# Patient Record
Sex: Male | Born: 1950
Health system: Southern US, Community
[De-identification: ages and names within clinical notes are randomized; demographics above are authoritative.]

## PROBLEM LIST (undated history)

## (undated) DIAGNOSIS — M545 Low back pain, unspecified: Secondary | ICD-10-CM

## (undated) DIAGNOSIS — I509 Heart failure, unspecified: Secondary | ICD-10-CM

## (undated) DIAGNOSIS — I10 Essential (primary) hypertension: Secondary | ICD-10-CM

## (undated) DIAGNOSIS — M5136 Other intervertebral disc degeneration, lumbar region: Secondary | ICD-10-CM

## (undated) DIAGNOSIS — K922 Gastrointestinal hemorrhage, unspecified: Secondary | ICD-10-CM

## (undated) DIAGNOSIS — I491 Atrial premature depolarization: Secondary | ICD-10-CM

## (undated) DIAGNOSIS — I2510 Atherosclerotic heart disease of native coronary artery without angina pectoris: Secondary | ICD-10-CM

## (undated) DIAGNOSIS — I480 Paroxysmal atrial fibrillation: Secondary | ICD-10-CM

## (undated) DIAGNOSIS — I259 Chronic ischemic heart disease, unspecified: Secondary | ICD-10-CM

## (undated) DIAGNOSIS — M51369 Other intervertebral disc degeneration, lumbar region without mention of lumbar back pain or lower extremity pain: Secondary | ICD-10-CM

## (undated) DIAGNOSIS — I4892 Unspecified atrial flutter: Secondary | ICD-10-CM

## (undated) DIAGNOSIS — N486 Induration penis plastica: Secondary | ICD-10-CM

## (undated) DIAGNOSIS — K219 Gastro-esophageal reflux disease without esophagitis: Secondary | ICD-10-CM

## (undated) HISTORY — DX: Gastrointestinal hemorrhage, unspecified: K92.2

## (undated) HISTORY — DX: Atherosclerotic heart disease of native coronary artery without angina pectoris: I25.10

## (undated) HISTORY — DX: Chronic ischemic heart disease, unspecified: I25.9

## (undated) HISTORY — DX: Essential (primary) hypertension: I10

## (undated) HISTORY — PX: HERNIA REPAIR: SHX51

## (undated) HISTORY — DX: Low back pain: M54.5

## (undated) HISTORY — DX: Unspecified atrial flutter: I48.92

## (undated) HISTORY — DX: Other intervertebral disc degeneration, lumbar region without mention of lumbar back pain or lower extremity pain: M51.369

## (undated) HISTORY — DX: Heart failure, unspecified: I50.9

## (undated) HISTORY — DX: Atrial premature depolarization: I49.1

## (undated) HISTORY — DX: Low back pain, unspecified: M54.50

## (undated) HISTORY — DX: Other intervertebral disc degeneration, lumbar region: M51.36

## (undated) HISTORY — DX: Paroxysmal atrial fibrillation: I48.0

## (undated) HISTORY — DX: Gastro-esophageal reflux disease without esophagitis: K21.9

## (undated) HISTORY — DX: Induration penis plastica: N48.6

---

## 1996-08-11 DIAGNOSIS — K260 Acute duodenal ulcer with hemorrhage: Secondary | ICD-10-CM | POA: Insufficient documentation

## 1998-04-06 ENCOUNTER — Ambulatory Visit: Admission: RE | Admit: 1998-04-06 | Discharge: 1998-04-06 | Payer: Self-pay | Admitting: Pulmonary Disease

## 1998-04-17 ENCOUNTER — Ambulatory Visit (HOSPITAL_COMMUNITY): Admission: RE | Admit: 1998-04-17 | Discharge: 1998-04-17 | Payer: Self-pay | Admitting: Sports Medicine

## 1998-10-31 HISTORY — PX: CORONARY ARTERY BYPASS GRAFT: SHX141

## 1999-03-11 ENCOUNTER — Encounter: Payer: Self-pay | Admitting: Emergency Medicine

## 1999-03-11 ENCOUNTER — Emergency Department (HOSPITAL_COMMUNITY): Admission: EM | Admit: 1999-03-11 | Discharge: 1999-03-11 | Payer: Self-pay | Admitting: Emergency Medicine

## 1999-03-20 ENCOUNTER — Inpatient Hospital Stay (HOSPITAL_COMMUNITY): Admission: AD | Admit: 1999-03-20 | Discharge: 1999-03-26 | Payer: Self-pay | Admitting: *Deleted

## 1999-03-21 ENCOUNTER — Encounter: Payer: Self-pay | Admitting: *Deleted

## 1999-03-22 ENCOUNTER — Encounter: Payer: Self-pay | Admitting: Cardiothoracic Surgery

## 1999-03-23 ENCOUNTER — Encounter: Payer: Self-pay | Admitting: Cardiothoracic Surgery

## 1999-03-24 ENCOUNTER — Encounter: Payer: Self-pay | Admitting: Cardiothoracic Surgery

## 1999-04-27 ENCOUNTER — Encounter (HOSPITAL_COMMUNITY): Admission: RE | Admit: 1999-04-27 | Discharge: 1999-07-26 | Payer: Self-pay | Admitting: *Deleted

## 1999-09-21 ENCOUNTER — Ambulatory Visit (HOSPITAL_COMMUNITY): Admission: RE | Admit: 1999-09-21 | Discharge: 1999-09-21 | Payer: Self-pay | Admitting: Cardiovascular Disease

## 2001-03-16 ENCOUNTER — Encounter: Payer: Self-pay | Admitting: Cardiothoracic Surgery

## 2001-03-16 ENCOUNTER — Encounter: Admission: RE | Admit: 2001-03-16 | Discharge: 2001-03-16 | Payer: Self-pay | Admitting: Cardiothoracic Surgery

## 2001-05-16 ENCOUNTER — Ambulatory Visit (HOSPITAL_COMMUNITY): Admission: RE | Admit: 2001-05-16 | Discharge: 2001-05-16 | Payer: Self-pay | Admitting: Surgery

## 2001-05-16 HISTORY — PX: HERNIA REPAIR: SHX51

## 2001-08-31 ENCOUNTER — Encounter: Payer: Self-pay | Admitting: Cardiology

## 2001-09-14 ENCOUNTER — Ambulatory Visit (HOSPITAL_COMMUNITY): Admission: RE | Admit: 2001-09-14 | Discharge: 2001-09-14 | Payer: Self-pay | Admitting: *Deleted

## 2001-09-14 ENCOUNTER — Encounter: Payer: Self-pay | Admitting: *Deleted

## 2001-09-14 HISTORY — PX: CARDIAC CATHETERIZATION: SHX172

## 2002-04-22 ENCOUNTER — Ambulatory Visit (HOSPITAL_COMMUNITY): Admission: RE | Admit: 2002-04-22 | Discharge: 2002-04-22 | Payer: Self-pay | Admitting: Surgery

## 2002-04-22 ENCOUNTER — Encounter (INDEPENDENT_AMBULATORY_CARE_PROVIDER_SITE_OTHER): Payer: Self-pay | Admitting: Specialist

## 2004-11-19 ENCOUNTER — Encounter: Admission: RE | Admit: 2004-11-19 | Discharge: 2004-11-19 | Payer: Self-pay | Admitting: Sports Medicine

## 2005-08-02 ENCOUNTER — Encounter: Payer: Self-pay | Admitting: Cardiology

## 2008-04-23 ENCOUNTER — Ambulatory Visit: Payer: Self-pay | Admitting: Internal Medicine

## 2008-04-23 DIAGNOSIS — K5900 Constipation, unspecified: Secondary | ICD-10-CM | POA: Insufficient documentation

## 2008-04-23 DIAGNOSIS — K219 Gastro-esophageal reflux disease without esophagitis: Secondary | ICD-10-CM

## 2008-04-24 ENCOUNTER — Telehealth: Payer: Self-pay | Admitting: Internal Medicine

## 2008-05-22 ENCOUNTER — Telehealth: Payer: Self-pay | Admitting: Internal Medicine

## 2009-06-05 ENCOUNTER — Encounter: Payer: Self-pay | Admitting: Cardiology

## 2009-06-08 ENCOUNTER — Encounter: Payer: Self-pay | Admitting: Cardiology

## 2009-06-19 ENCOUNTER — Encounter: Payer: Self-pay | Admitting: Cardiology

## 2009-09-11 ENCOUNTER — Encounter: Payer: Self-pay | Admitting: Cardiology

## 2009-09-17 ENCOUNTER — Encounter: Payer: Self-pay | Admitting: Cardiology

## 2009-09-29 ENCOUNTER — Encounter: Payer: Self-pay | Admitting: Sports Medicine

## 2010-01-20 ENCOUNTER — Ambulatory Visit: Payer: Self-pay | Admitting: Sports Medicine

## 2010-01-20 DIAGNOSIS — M1710 Unilateral primary osteoarthritis, unspecified knee: Secondary | ICD-10-CM

## 2010-01-20 DIAGNOSIS — M25569 Pain in unspecified knee: Secondary | ICD-10-CM

## 2010-08-10 DIAGNOSIS — I2510 Atherosclerotic heart disease of native coronary artery without angina pectoris: Secondary | ICD-10-CM | POA: Insufficient documentation

## 2010-08-10 DIAGNOSIS — I10 Essential (primary) hypertension: Secondary | ICD-10-CM | POA: Insufficient documentation

## 2010-08-10 DIAGNOSIS — K922 Gastrointestinal hemorrhage, unspecified: Secondary | ICD-10-CM | POA: Insufficient documentation

## 2010-08-10 DIAGNOSIS — M549 Dorsalgia, unspecified: Secondary | ICD-10-CM | POA: Insufficient documentation

## 2010-08-11 ENCOUNTER — Ambulatory Visit: Payer: Self-pay | Admitting: Cardiology

## 2010-08-11 DIAGNOSIS — E785 Hyperlipidemia, unspecified: Secondary | ICD-10-CM

## 2010-08-11 DIAGNOSIS — I2589 Other forms of chronic ischemic heart disease: Secondary | ICD-10-CM

## 2010-09-20 ENCOUNTER — Encounter: Payer: Self-pay | Admitting: Internal Medicine

## 2010-09-21 ENCOUNTER — Encounter: Payer: Self-pay | Admitting: Internal Medicine

## 2010-11-21 ENCOUNTER — Encounter: Payer: Self-pay | Admitting: Sports Medicine

## 2010-12-02 NOTE — Letter (Signed)
Summary: Keith Vazquez's Office Visit Note   Keith Vazquez's Office Visit Note   Imported By: Roderic Ovens 09/07/2010 13:38:58  _____________________________________________________________________  External Attachment:    Type:   Image     Comment:   External Document

## 2010-12-02 NOTE — Letter (Signed)
Summary: [facility]   Imported By: Roderic Ovens 09/07/2010 13:39:34  _____________________________________________________________________  External Attachment:    Type:   Image     Comment:   External Document

## 2010-12-02 NOTE — Consult Note (Signed)
Summary: [facility]   Imported By: Marily Memos 12/29/2009 15:16:40  _____________________________________________________________________  External Attachment:    Type:   Image     Comment:   External Document

## 2010-12-02 NOTE — Letter (Signed)
Summary: [facility]  [facility]   Imported By: Marylou Mccoy 09/02/2010 14:50:41  _____________________________________________________________________  External Attachment:    Type:   Image     Comment:   External Document

## 2010-12-02 NOTE — Letter (Signed)
Summary: [facility]  [facility] Myocardial Perfusion 2004 - 2008   Imported By: Roderic Ovens 09/03/2010 12:57:56  _____________________________________________________________________  External Attachment:    Type:   Image     Comment:   External Document

## 2010-12-02 NOTE — Letter (Signed)
Summary: [facility] Office Visit Note   [facility] Office Visit Note   Imported By: Roderic Ovens 09/03/2010 12:58:40  _____________________________________________________________________  External Attachment:    Type:   Image     Comment:   External Document

## 2010-12-02 NOTE — Assessment & Plan Note (Signed)
Summary: KNEE PAIN,MC   Vital Signs:  Patient profile:   60 year old male Height:      74 inches Weight:      200 pounds BMI:     25.77 BP sitting:   143 / 83  Vitals Entered By: Enid Baas MD (January 20, 2010 3:53 PM)  Primary Provider:  company physician, Bradd Canary   History of Present Illness: 25 yrs ago R MCL tear repaired by Dr. Dian Queen  lumbar facet joint Dr. 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 Darvocet 2)  ! Percocet  Physical Exam  General:  pleasant w male in nad  R knee lacks 10 degrees to full flexion lacks 4 degrees full extension R knee positive McMurry's there is also mild pain on thessaly test  no effusion now prob some med joint line hypertrophy   Impression & Recommendations:  Problem # 1:  KNEE PAIN, RIGHT (ICD-719.46)  His updated medication list for this problem includes:    Celebrex 200 Mg Caps (Celecoxib) .Marland Kitchen... As needed    Vicodin 5-500 Mg Tabs (Hydrocodone-acetaminophen) .Marland Kitchen... As needed  this is improving and he can use as needed meds  wedge added to RT heel on medial aspect to unload and this lessens his walking pain no pain jogging with this in place gait is normal  Problem # 2:  OSTEOARTHRITIS, KNEE, RIGHT (ICD-715.96)  His updated medication list for this problem includes:    Celebrex 200 Mg Caps (Celecoxib) .Marland Kitchen... As needed    Vicodin 5-500 Mg Tabs (Hydrocodone-acetaminophen) .Marland Kitchen... As needed  I suspect most of his change is chronic and represents mild DJD may well have degenerative meniscal  tearing with findings on exam  rec biking for primary sport activity to protect knee some walking and running OK avoid stair master or steps  reck prn  Complete Medication List: 1)  Limbrel 500 Mg Caps (Flavocoxid) .... Two times a day 2)  Celebrex 200 Mg Caps (Celecoxib) .... As needed 3)  Prilosec 20 Mg Cpdr (Omeprazole) .... As needed 4)  Vicodin 5-500 Mg Tabs (Hydrocodone-acetaminophen) .... As needed 5)  Protonix 40 Mg Tbec (Pantoprazole sodium) .Marland Kitchen.. 1tablet by mouth once daily  Patient Instructions: 1)  both Bike and eliptical ok 2)  Stay away from stairmaster 3)  no squats

## 2010-12-02 NOTE — Assessment & Plan Note (Signed)
Summary: np6/eval for stress test/hx of cabg   Primary Provider:  company physician, Bradd Canary  CC:  referal from Dr. Lorenz Coaster...for the [employer]  pt states a routine .  History of Present Illness: 60 year old male for evaluation of coronary artery disease. Patient is status post coronary artery bypass graft in 2000. Last cardiac catheterization was performed in 2003 and revealed mildly reduced LV function with an ejection fraction of 51%, native three-vessel coronary artery disease, the LIMA to LAD was occluded, saphenous vein graft to the second diagonal was patent, saphenous vein graft to the PDA was patent and the sequential saphenous vein graft to the ramus intermedius and marginal was patent. Myoview in November of 2010 showed an ejection fraction of 32%. There was a prior inferior lateral infarct but no ischemia. Patient denies dyspnea on exertion, orthopnea, PND, pedal edema, palpitations, syncope or chest pain. He exercises routinely with no symptoms. 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  Current Medications (verified): 1)  Limbrel 500 Mg  Caps (Flavocoxid) .... Two Times A Day 2)  Vitamin B .... 1 Tab By Mouth Once Daily 3)  Vitamin D 1000 Unit Tabs (Cholecalciferol) .Marland Kitchen.. 1 Tab By Mouth Once Daily 4)  Fish Oil   Oil (Fish Oil) .Marland Kitchen.. 1 Tab By Mouth Once Daily 5)  Niacin Cr 250 Mg Cr-Caps (Niacin) .Marland Kitchen.. 1 Tab By Mouth Once Daily 6)  Metamucil .Marland Kitchen.. 1 Tab By Mouth Once Daily 7)  Ramipril 10 Mg Caps (Ramipril) .... Take One Capsule By Mouth Daily 8)  Crestor 10 Mg Tabs (Rosuvastatin Calcium) .... 1/4 of A Pill Every A Pilll Every Other Day 9)  Limbrel 500 Mg Caps (Flavocoxid) .Marland Kitchen.. 1 Tab By Mouth Once Daily  Allergies: 1)  ! Darvocet 2)  ! Percocet  Past History:  Past Medical History: Coronary Artery Disease-status post coronary artery bypass grafting in 2000 HYPERTENSION  Hyperlipidemia GI Bleed-1997 secondary to duodenal ulcer and requiring endoscopic hemostatic  therapy OSTEOARTHRITIS, KNEE, RIGHT  GERD   Past Surgical History: CABG w LIMA to LAD, SVG to diagonal, SVG to Ramus-OM and SVG to PDA 2000 Hernia Surgery -  bilateral inguinal with unilateral read Ulcer surgery - endoscopic. Right knee surgery  Family History: Reviewed history from 04/23/2008 and no changes required. Patient adopted   Social History: Reviewed history from 04/23/2008 and no changes required. 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 single without children  Review of Systems       Arthralgias but no fevers or chills, productive cough, hemoptysis, dysphasia, odynophagia, melena, hematochezia, dysuria, hematuria, rash, seizure activity, orthopnea, PND, pedal edema, claudication. Remaining systems are negative.   Vital Signs:  Patient profile:   60 year old male Height:      74 inches Weight:      210 pounds BMI:     27.06 Resp:     12 per minute BP sitting:   138 / 84  (left arm)  Vitals Entered By: Kem Parkinson (August 11, 2010 10:27 AM)  Physical Exam  General:  Well developed/well nourished in NAD Skin warm/dry Patient not depressed No peripheral clubbing Back-normal HEENT-normal/normal eyelids Neck supple/normal carotid upstroke bilaterally; no bruits; no JVD; no thyromegaly chest - CTA/ normal expansion CV - RRR/normal S1 and S2; no murmurs, rubs or gallops;  PMI nondisplaced Abdomen -NT/ND, no HSM, no mass, + bowel sounds, no bruit 2+ femoral pulses, no bruits Ext-no edema,  chords, 2+ DP Neuro-grossly nonfocal     EKG  Procedure date:  08/11/2010  Findings:      Normal sinus rhythm at a rate of 91. Prior inferior infarct.  Impression & Recommendations:  Problem # 1:  CAD (ICD-414.00) Patient presents with history of coronary artery disease, ischemic cardiomyopathy and for treadmill prior to [facility] evaluation. I reviewed his outside records and  his previous Myoview suggested an ejection fraction of 32%. I had lengthy discussions with him today concerning the implications of that. If indeed his ejection fraction is less than 35% then he would be at risk for sudden death and an ICD would be indicated. I suggested that we repeat his stress Myoview both to exclude ischemia and to reassess LV function. I also recommended an echocardiogram to assess LV function. The patient became very upset with the above. He states that the ejection fraction of 32% was felt to be a mistake previously. He also states that he has no symptoms and therefore his ejection fraction could not be at this level. I explained that this was the reason we should repeat the studies to reassess his LV function. However he would not agree to this and walked out of the office. He stated he would not want further care from me. I also recommended an aspirin and he stated he would consider this. I also felt he should be on a beta blocker but he states he felt fatigued on Toprol previously. He would not wait for further instructions concerning other potential beta blockers. I spent a significant amount of time with the patient trying to explain all of the above. He indicated he would not return. His updated medication list for this problem includes:    Ramipril 10 Mg Caps (Ramipril) .Marland Kitchen... Take one capsule by mouth daily  Problem # 2:  ISCHEMIC CARDIOMYOPATHY (ICD-414.8) See discussion above. Continue ACE inhibitor. Patient would not agree to further testing. His updated medication list for this problem includes:    Ramipril 10 Mg Caps (Ramipril) .Marland Kitchen... Take one capsule by mouth daily  Problem # 3:  HYPERLIPIDEMIA (ICD-272.4) Continue present medications. Lipids and liver monitored by primary care. His updated medication list for this problem includes:    Niacin Cr 250 Mg Cr-caps (Niacin) .Marland Kitchen... 1 tab by mouth once daily    Crestor 10 Mg Tabs (Rosuvastatin calcium) .Marland Kitchen... 1/4 of a pill  every a pilll every other day  Problem # 4:  HYPERTENSION (ICD-401.9) Blood pressure controlled on present medications. Potassium and renal function monitored by primary care. His updated medication list for this problem includes:    Ramipril 10 Mg Caps (Ramipril) .Marland Kitchen... Take one capsule by mouth daily  Orders: EKG w/ Interpretation (93000)  Patient Instructions: 1)  Your physician recommends that you schedule a follow-up appointment in: AS NEEDED 2)  Your physician recommends that you continue on your current medications as directed. Please refer to the Current Medication list given to you today.

## 2011-03-18 NOTE — Op Note (Signed)
[facility]  Patient:    LERAY, GARVERICK                     MRN: [id number] Proc. Date: 05/16/01 Adm. Date:  [id number] Attending:  Katha Cabal CC:         Cecil Cranker, M.D. [facility]  Barron Alvine, M.D.   Operative Report  CCS#:  [id number]  PREOPERATIVE DIAGNOSIS:  Bilateral inguinal hernia, left larger than right.  POSTOPERATIVE DIAGNOSIS:  Large left indirect inguinal hernia with right direct hernia.  SURGEON:  Thornton Park. Daphine Deutscher, M.D.  ANESTHESIA:  General endotracheal.  PROCEDURE:  Laparoscopic bilateral inguinal hernia repairs.  DESCRIPTION OF PROCEDURE:  Mr. Freimark was taken to room #1 and given general anesthesia. The abdomen was shaved and prepped with Betadine and draped sterilely. A longitudinal incision was made beneath the umbilicus and I went off to the left of the midline and in the rectus space created a blunt track down to the pubis. The dissection balloon was inserted and with the zero degree scope in the balloon, this was insufflated and preperitoneal dissection was performed which really dissected more to the right side than to the left. However, I was able to then establish pneumo in the preperitoneum and put in the two 5 mm trocars sliding off the midline under direct vision with the scope. Using these, I then dissected first the right inguinal region and found there to be a direct defect. The cord structures were skeletonized and no indirect hernia was seen. I did get around the cord structures on that side. On the left side, however, there was a massive more of a scrotal hernia. I had to dissect around the cord to then appreciate the size of this defect. I then began teasing the sac out and find just a massive preperitoneal fatty hernia which I completely reduced and skeletonized the cord. A piece of 3 x 6 inch Mesh was dissected laterally and was inserted into the preperitoneal space and was placed around the  cord structures intact laterally where I could palpate the tacker and along the pubis medially and then anteriorly in the midline. On top of that a piece of Davol precut mesh was brought intact medially and then it laid out laterally over this piece. This was a large piece of the Davol mesh preshaped. It was tacked along the pubic bone and then anteriorly.  Next, I went to the patients right side and took the piece of mesh that was for the right side and again marking it where it would go medially and then tacked it medially and then let it splay out laterally. It seemed to cover the defect nicely. The preperitoneal space was then inspected both with my finger inside after I removed the trocar and then put it back in and looked and again I felt like everything was nicely secured. Because of the extent of dissection on the left, the patient did have a pneumo in the scrotum which was anticipated. I did get well around the cord structures with the first piece of mesh and the second piece was a symmetrical piece of Davol mesh both for the left side and the right side. Thus, the patient had two pieces of mesh on the left and one on the right. The patient seemed to tolerate the procedure well. After deflation, the pursestring suture that I placed in the anterior rectus fascia was tied down and the skin was closed with 4-0 Vicryl  with Benzoin and Steri-Strips. The patient seemed to tolerate the procedure well and was taken to the recovery room in satisfactory condition. He will be given Mepergan Fortis to take for pain and will be followed up in the office in two weeks. DD:  05/16/01 TD:  05/16/01 Job: [id number]

## 2011-03-18 NOTE — Cardiovascular Report (Signed)
. [facility]  Patient:    Keith Vazquez, Keith Vazquez Visit Number: [id number] MRN: [id number]          Service Type: CAT Location: [facility] [id number] Attending Physician:  Daisey Must Proc. Date: 09/14/01 Admit Date:  09/14/2001   CC:         Keith Vazquez, M.D. [facility]  Bea Laura Graceann Congress, M.D. [facility]   Cardiac Catheterization  PROCEDURE:  Left heart catheterization with coronary angiography, bypass graft angiography, and left ventriculography.  INDICATIONS:  Keith Vazquez is a 60 year old male with a history of previous coronary artery bypass surgery.  He recently had a stress Cardiolite scan which showed prior inferolateral infarct with mild periinfarct ischemia in the high lateral wall.  He was thus referred for cardiac catheterization.  DESCRIPTION OF PROCEDURE:  A 6 French sheath was placed in the right femoral artery.  Left coronary angiography was performed with a 6 [place] JL4.  We used a 6 [place] JR4 to perform right coronary angiography as well as angiography of the saphenous vein bypass grafts and the left internal mammary artery graft. Left ventriculography was performed with an angled pigtail.  Contrast was Omnipaque.  At the conclusion of the case, Perclose vascular closure device was placed in the right femoral artery with good hemostasis.  There were no complications.  RESULTS:  HEMODYNAMICS:  Left ventricular pressure 104/16, aortic pressure 104/62.  There was no aortic valve gradient.  LEFT VENTRICULOGRAM:  There is moderate hypokinesis of the inferior wall. Ejection fraction is calculated at 51%.  No mitral regurgitation.  CORONARY ARTERIOGRAPHY:  (Right dominant)  Left main has a distal 50% stenosis.  Left anterior descending artery has a 70% stenosis in the proximal vessel and a tubular 90% stenosis in the midvessel between the first and second diagonal branches.  There is a small first diagonal and large second diagonal.  The left  circumflex has a 50% stenosis at its origin and 100% occlusion in the midvessel.  Proximal to the occlusion there is a large ramus intermediate which has a 30% stenosis proximally.  There is also a normal size obtuse marginal arising distal to the occlusion which fills via saphenuos vein graft.  Right coronary artery is 100% occluded at its origin.  Left internal mammary artery to the distal LAD is very atretic proximally and 100% occluded in the midvessel.  Saphenuos vein graft to the second diagonal branch is patent.  This fills a normal size second diagonal with retrograde filling to the mid and distal LAD. The distal LAD appears to get adequate blood flow via this vein graft.  Sequential saphenuos vein graft to the ramus intermediate and the obtuse marginal is patent.  There is a 20% stenosis in the proximal portion of this vein and some mild ectasia in the distal portion of this vein graft at what appears to be a venous valve.  This fills a large size ramus intermediate and a normal size obtuse marginal.  Saphenuos vein graft to the posterior descending artery is patent throughout its course with mild ectasia in the proximal graft.  This fills a large posterior descending artery.  This also fills retrograde to the mid right coronary artery as well as into the distal AV groove portion of the right coronary artery which supplies two small posterolateral branches.  There is a tubular 80% stenosis in the proximal portion of the posterior descending artery which does jeopardize a small posterolateral branches.  IMPRESSION: 1. Mildly decreased left ventricular  systolic function. 2. Native three vessel coronary artery disease as described. 3. Status post coronary artery bypass grafting with all three saphenuos vein    grafts patent.   The left internal mammary artery is atretic and occluded    in the midvessel.  This is likely secondary to competitive flow into the    distal LAD via  the vein graft to the diagonal branch.  The distal LAD    does appear to be adequately perfused by this vein graft to the diagonal    There is a stenosis in the proximal posterior descending artery which is    proximal to the vein graft insertion site.  This is likely responsible for    the mild high lateral wall ischemia seen on Cardiolite.  This is a very    small territory of myocardium being perfused by a very small posterolateral    branches and does not appear to be clinically significant.  PLAN:  The patient will be continued on medical therapy. Attending Physician:  Daisey Must DD:  09/14/01 TD:  09/14/01 Job: [id number]

## 2011-03-18 NOTE — Op Note (Signed)
[facility]  Patient:    Keith Vazquez, Keith Vazquez Visit Number: [id number] MRN: [id number]          Service Type: DSU Location: DAY Attending Physician:  Katha Cabal Dictated by:   Thornton Park Daphine Deutscher, M.D. Proc. Date: 04/22/02 Admit Date:  04/22/2002   CC:         Cecil Cranker, M.D. [facility]   Operative Report  PREOPERATIVE DIAGNOSIS:  Recurrent left inguinal hernia.  POSTOPERATIVE DIAGNOSIS:  Left indirect recurrent inguinal hernia.  SURGEON:  Thornton Park. Daphine Deutscher, M.D.  ASSISTANT:  Sandria Bales. Ezzard Standing, M.D.  ANESTHESIA:  General endotracheal.  DESCRIPTION OF PROCEDURE:  The patient underwent a laparoscopic bilateral inguinal herniorrhaphy on May 16, 2001. At that time, he was found to have a massive scrotal hernia on the left side which with the hernia and a large lipoma were reduced in mass. The left side was repaired with a piece of mesh that was cut to go around the cord structures and also a second piece of Davol mesh that was placed on top of that. The patient describes some callisthenics and lifting and developed a recurrent bulge in that area.  Informed consent was obtained in the office when I saw him about having to do this open with many of the same complications as described before including numbness, nerve pain, cord ischemia.  The patient was taken to room six at [facility] in general anesthesia. The abdomen was prepped with Betadine, draped sterilely. I first injected the area with a mixture of Marcaine and lidocaine and made a small oblique incision. The incision was made and fatty tissue was divided with the electrocautery. I had to expose the external oblique, which I incised, and then opened with the scissors. I took care to avoid any nerve injuries of the cord structures. A larger nerve branch was noted underneath the superior flap which I avoided when I subsequently repaired with mesh. First, I got around the cord with  a Penrose drain and mobilized the cord completely. The floor actually looked good. There was a large indirect hernia present. I opened the cord structures proximally and dissected out a massive lipoma. I ended up skeletonizing that and removing a lot of the peritoneum that went along around it. I found a tiny indirect hernia and I excised the excess of that and I closed the sac. I was then able to tuck all of this down inside this defect. I could feel mesh laterally and this hernia had come through a fairly small little ring within the mesh. I grasped the edges of the mesh and put a single 0 Prolene to approximate this and this closed down the neoring from laterally to allow just a Kelly clamp to make plenty of room for the vas deferens and the spermatic vessels, which were intact.  Next, I cut a piece of Marlex mesh to fit, sutured it to the inguinal ligament with a running 2-0 Prolene medially. I sutured it with a running 2-0 Prolene and cut it and tucked it beneath the external oblique and sutured it with a horizontal mattress of 2-0 Prolene.  The inguinal nerve, as mentioned before, was avoided in this. The fascia was closed with a running 2-0 Vicryl. Subcutaneous tissue closed with 4-0 Vicryl and along with 4-0 and 5-0 Vicryl to close the skin. Benzoin and Steri-Strips were used on the skin.  In summary, this appeared to be a recurrent indirect inguinal hernia with a neosac that had accompanied  a large properitoneal fat mass that occurred through a fairly small defect in the mesh ring.  The patient tolerated the procedure well and was taken to the recovery room in satisfactory condition. The patient will be given Mepergan Fortis to take for pain and discharged to be followed up in the office in two weeks. Dictated by:   Thornton Park Daphine Deutscher, M.D. Attending Physician:  Katha Cabal DD:  04/22/02 TD:  04/23/02 Job: [id number]

## 2011-04-12 ENCOUNTER — Encounter: Payer: Self-pay | Admitting: Cardiology

## 2011-09-05 ENCOUNTER — Encounter: Payer: Self-pay | Admitting: Internal Medicine

## 2011-09-21 ENCOUNTER — Encounter: Payer: Self-pay | Admitting: Internal Medicine

## 2011-10-21 ENCOUNTER — Encounter: Payer: Self-pay | Admitting: *Deleted

## 2011-10-21 ENCOUNTER — Encounter: Payer: Self-pay | Admitting: Internal Medicine

## 2011-10-21 ENCOUNTER — Ambulatory Visit (INDEPENDENT_AMBULATORY_CARE_PROVIDER_SITE_OTHER): Payer: BC Managed Care – PPO | Admitting: Internal Medicine

## 2011-10-21 DIAGNOSIS — I2589 Other forms of chronic ischemic heart disease: Secondary | ICD-10-CM

## 2011-10-21 DIAGNOSIS — I4891 Unspecified atrial fibrillation: Secondary | ICD-10-CM | POA: Insufficient documentation

## 2011-10-21 NOTE — Assessment & Plan Note (Signed)
Today we've spent a considerable amount of time discussing his treatment options. 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 The patient thinks a stress test but free of arrhythmia would be sufficient. I will need to confirm this with his regular cardiologist, Dr. Majel Homer. If maintaining sinus rhythm is ultimately required, then there are 4 drugs which would be considered. Amiodarone, sotalol, dofetilide, and Dronenderone would all be options. The patient is not particularly interested in hospitalization. Of course sotalol and dofetilide would require 2 or 3 days in the hospital for monitoring should he choose these drugs. 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 I plan to discuss these issues with his primary cardiologist Dr. Majel Homer. At this point, catheter ablation would not be recommended as he has not yet failed a primary antiarrhythmic drug.

## 2011-10-21 NOTE — Patient Instructions (Signed)
Your physician wants you to follow-up in: 6 MONTHS WITH DR. TAYLOR. You will receive a reminder letter in the mail two months in advance. If you don't receive a letter, please call our office to schedule the follow-up appointment.  Your physician recommends that you continue on your current medications as directed. Please refer to the Current Medication list given to you today.  

## 2011-10-21 NOTE — Assessment & Plan Note (Signed)
The patient is asymptomatic. He has class I symptoms. We'll continue his current medical therapy

## 2011-10-21 NOTE — Progress Notes (Signed)
HPI Keith Vazquez is referred today for evaluation of atrial fibrillation. The patient works as a Control and instrumentation engineer. He has a history of coronary disease and is status post bypass surgery. Most recent stress test demonstrated left ventricular dysfunction with an ejection fraction of 36%. He had no ischemia. During his stress test, he experienced atrial fibrillation in recovery which was asymptomatic. The patient does not feel palpitations. He has never had syncope, chest pain, and denies shortness of breath. He remains active without limitation. The patient has worn a cardiac monitor which is described as having atrial fibrillation approximately 15% of the time. The patient is here today inquiring as to whether he would be a candidate for catheter ablation in hopes of maintaining his Education officer, museum. He has been unwilling to take an antiarrhythmic drug at this point. Allergies  Allergen Reactions  . Oxycodone-Acetaminophen   . Propoxyphene N-Acetaminophen      Current Outpatient Prescriptions  Medication Sig Dispense Refill  . Ascorbic Acid (VITAMIN C) 100 MG tablet Take 100 mg by mouth daily.        Marland Kitchen aspirin 81 MG tablet Take 81 mg by mouth daily.        Marland Kitchen b complex vitamins capsule Take 1 capsule by mouth daily.        . celecoxib (CELEBREX) 200 MG capsule Take 200 mg by mouth as needed.       . Cholecalciferol (VITAMIN D) 1000 UNITS capsule Take 1,000 Units by mouth daily.        . Flavocoxid (LIMBREL) 500 MG CAPS Take 500 mg by mouth daily.        . hydrocodone-acetaminophen (LORCET-HD) 5-500 MG per capsule Take 1 capsule by mouth every 6 (six) hours as needed.        . niacin 250 MG CR capsule Take 250 mg by mouth daily.        . Omega-3 Fatty Acids (FISH OIL PO) Take 1 tablet by mouth daily.        . pantoprazole (PROTONIX) 40 MG tablet Take 40 mg by mouth daily.        . psyllium (METAMUCIL) 58.6 % powder Take 1 packet by mouth as needed.        . ramipril (ALTACE) 10 MG tablet  Take 10 mg by mouth daily.           Past Medical History  Diagnosis Date  . Coronary artery disease     CABG x5 in 2002 -- per stress test in 09/05/11 Est EF of 36%  . PAC (premature atrial contraction)     OCCASIONAL   . Hypertension     Borderline  . Degeneration of lumbar intervertebral disc   . GERD (gastroesophageal reflux disease)   . Ischemic heart disease   . Low back pain   . Peyronie's disease     ROS:   All systems reviewed and negative except as noted in the HPI.   Past Surgical History  Procedure Date  . Coronary artery bypass graft 2002    x5 --   . Cardiac catheterization 09/14/2001     Mildly decreased left ventricular systolic function --  Native three vessel coronary artery disease as described. -- Status post coronary artery bypass grafting  . Hernia repair 05/16/2001    Large left indirect inguinal hernia with right direct hernia.  Marland Kitchen Hernia repair     Recurrent left inguinal hernia -- Large left indirect inguinal hernia with right direct hernia.  No family history on file.   History   Social History  . Marital Status: Single    Spouse Name: N/A    Number of Children: N/A  . Years of Education: N/A   Occupational History  . Not on file.   Social History Main Topics  . Smoking status: Unknown If Ever Smoked  . Smokeless tobacco: Not on file  . Alcohol Use: Not on file  . Drug Use: Not on file  . Sexually Active: Not on file   Other Topics Concern  . Not on file   Social History Narrative  . No narrative on file     BP 155/84  Pulse 65  Wt 96.616 kg (213 lb)  Physical Exam:  Well appearing middle-age man, NAD HEENT: Unremarkable, normocephalic and atraumatic. Neck:  No JVD, no thyromegally Lymphatics:  No adenopathy Back:  No CVA tenderness Lungs:  Clear with no wheezes, rales, or rhonchi. HEART:  Regular rate rhythm, no murmurs, no rubs, no clicks Abd:  soft, positive bowel sounds, no organomegally, no rebound, no  guarding Ext:  2 plus pulses, no edema, no cyanosis, no clubbing Skin:  No rashes no nodules Neuro:  CN II through XII intact, motor grossly intact  EKG Normal sinus rhythm with PVCs and PACs  Assess/Plan:

## 2011-11-09 ENCOUNTER — Institutional Professional Consult (permissible substitution): Payer: Self-pay | Admitting: Internal Medicine

## 2012-04-17 LAB — PULMONARY FUNCTION TEST

## 2012-04-19 ENCOUNTER — Ambulatory Visit
Admission: RE | Admit: 2012-04-19 | Discharge: 2012-04-19 | Disposition: A | Discharge status: Home / Self Care | Payer: BC Managed Care – PPO | Source: Ambulatory Visit | Attending: Family Medicine | Admitting: Family Medicine

## 2012-04-19 ENCOUNTER — Other Ambulatory Visit: Payer: Self-pay | Admitting: Family Medicine

## 2012-04-19 ENCOUNTER — Telehealth: Payer: Self-pay | Admitting: Internal Medicine

## 2012-04-19 DIAGNOSIS — R0600 Dyspnea, unspecified: Secondary | ICD-10-CM

## 2012-04-19 NOTE — Telephone Encounter (Signed)
New msg Dr Polly Cobia office wants to see if can get pt in sooner Please call back

## 2012-04-19 NOTE — Telephone Encounter (Signed)
05/10/12 Apt moved up office aware and Leotis Shames will let pt know

## 2012-04-20 ENCOUNTER — Telehealth: Payer: Self-pay | Admitting: Internal Medicine

## 2012-04-20 NOTE — Telephone Encounter (Signed)
I spoke with Dr. Geoffery Lyons office. They wanted to verify the time for his appointment on 7/11. I explained he had a cancellation for Tuesday 04/24/12 at 8:45 am. They are going to call the patient and make him aware.

## 2012-04-20 NOTE — Telephone Encounter (Signed)
New Problem:    Called in wanting to patient to be seen by Dr. Ladona Ridgel ASAP.  Believes that the patient's Afib has gotten a lot worse, is at high risk for a stroke and needs to be on anticoagulants.  Please call back.

## 2012-04-24 ENCOUNTER — Ambulatory Visit (INDEPENDENT_AMBULATORY_CARE_PROVIDER_SITE_OTHER): Payer: BC Managed Care – PPO | Admitting: Internal Medicine

## 2012-04-24 ENCOUNTER — Encounter: Payer: Self-pay | Admitting: Internal Medicine

## 2012-04-24 ENCOUNTER — Telehealth: Payer: Self-pay | Admitting: Internal Medicine

## 2012-04-24 VITALS — BP 132/72 | HR 88 | Resp 18 | Ht 74.0 in | Wt 210.8 lb

## 2012-04-24 DIAGNOSIS — I2589 Other forms of chronic ischemic heart disease: Secondary | ICD-10-CM

## 2012-04-24 DIAGNOSIS — I2510 Atherosclerotic heart disease of native coronary artery without angina pectoris: Secondary | ICD-10-CM

## 2012-04-24 DIAGNOSIS — I10 Essential (primary) hypertension: Secondary | ICD-10-CM

## 2012-04-24 DIAGNOSIS — I4891 Unspecified atrial fibrillation: Secondary | ICD-10-CM

## 2012-04-24 NOTE — Patient Instructions (Signed)
Your physician wants you to follow-up in: November 2013 after the Stress Myoveiw You will receive a reminder letter in the mail two months in advance. If you don't receive a letter, please call our office to schedule the follow-up appointment.  Your physician has requested that you have en exercise stress myoview. For further information please visit [email or web address] Please follow instruction sheet, as given.--NEEDS TO BE DONE AFTER 09/05/12  Your physician has requested that you have an echocardiogram. Echocardiography is a painless test that uses sound waves to create images of your heart. It provides your doctor with information about the size and shape of your heart and how well your heart's chambers and valves are working. This procedure takes approximately one hour. There are no restrictions for this procedure.  NEED TO START METOPROLOL 25MG  TWICE DAILY AFTER 08/14/12 AND START MULTAQ AS PREVIOUSLY PRESCRIBED   Amiodarone is the name of the othe medication

## 2012-04-24 NOTE — Telephone Encounter (Signed)
New Problem:    Patient called in to report that amioderone is apporved by the [employer] up to 200Mg  a day.  Patient would like to know if you will prescribe that medication to him under certain circumstances.  Please call back.

## 2012-04-24 NOTE — Assessment & Plan Note (Signed)
I discussed the treatment options in detail with the patient. He has failed antiarrhythmic drug therapy and would normally be a candidate for catheter ablation. Because the patient is asymptomatic, I am reluctant to recommend this procedure for the patient. I've explained to him that there is no cure for atrial fibrillation but that the catheter ablation procedure offers a treatment. The expectation would be that his amount of atrial fibrillation would be reduced. It would not be likely to eliminate it altogether. I also discussed antiarrhythmic drug therapy with the patient. It is hard to justify the risk of antiarrhythmic drug therapy in someone who is truly asymptomatic. I do think he should be under continuous anticoagulation for thromboembolic prevention. After an extensive discussion, I recommended that the patient undergo repeat stress testing in November. Hopefully this time he will pass a stress test and be able to reobtain his Education officer, [employer].

## 2012-04-24 NOTE — Assessment & Plan Note (Signed)
His ejection fraction is approximately 40%. Continue current medical therapy. I've recommended that he restart his beta blocker. He is prescribed metoprol 25 mg twice daily.

## 2012-04-24 NOTE — Progress Notes (Signed)
HPI Keith Vazquez returns today for followup. He is a 61 year old man with a history of coronary disease status post MI, paroxysmal atrial fibrillation and flutter, and dyslipidemia. 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 Unfortunately stress testing and Holter monitoring have demonstrated paroxysmal atrial fibrillation and flutter at rapid ventricular rates. When I saw the patient last several months ago, we outlined possible medical treatment options. He tried Dronenderone but unfortunately had recurrent atrial fibrillation and flutter. He is referred back today discussed treatment options. The patient states that he is asymptomatic with regard to atrial fibrillation or flutter. He does not have palpitations. He states that he does not know what he is in atrial fibrillation or flutter. He remains active and has no limitation to his physical activity. He has not had syncope. His most recent echocardiogram suggested that his left ventricular function has improved to 40%. Allergies  Allergen Reactions  . Oxycodone-Acetaminophen   . Propoxyphene-Acetaminophen      Current Outpatient Prescriptions  Medication Sig Dispense Refill  . Ascorbic Acid (VITAMIN C) 100 MG tablet Take 100 mg by mouth daily.        Marland Kitchen b complex vitamins capsule Take 1 capsule by mouth daily.        . celecoxib (CELEBREX) 200 MG capsule Take 200 mg by mouth as needed.       . Cholecalciferol (VITAMIN D) 1000 UNITS capsule Take 1,000 Units by mouth daily.        . Flavocoxid (LIMBREL) 500 MG CAPS Take 500 mg by mouth daily.        . niacin 250 MG CR capsule Take 250 mg by mouth daily.        . Omega-3 Fatty Acids (FISH OIL PO) Take 1 tablet by mouth daily.        . pravastatin (PRAVACHOL) 10 MG tablet       . psyllium (METAMUCIL) 58.6 % powder Take 1 packet by mouth as needed.        . ramipril (ALTACE) 10 MG tablet Take 10 mg by mouth daily.        Marland Kitchen triamcinolone (NASACORT) 55 MCG/ACT  nasal inhaler       . aspirin 81 MG tablet Take 81 mg by mouth daily.        . hydrocodone-acetaminophen (LORCET-HD) 5-500 MG per capsule Take 1 capsule by mouth every 6 (six) hours as needed.        . pantoprazole (PROTONIX) 40 MG tablet Take 40 mg by mouth daily.           Past Medical History  Diagnosis Date  . Coronary artery disease     CABG x5 in 2002 -- per stress test in 09/05/11 Est EF of 36%  . PAC (premature atrial contraction)     OCCASIONAL   . Hypertension     Borderline  . Degeneration of lumbar intervertebral disc   . GERD (gastroesophageal reflux disease)   . Ischemic heart disease   . Low back pain   . Peyronie's disease     ROS:   All systems reviewed and negative except as noted in the HPI.   Past Surgical History  Procedure Date  . Coronary artery bypass graft 2002    x5 --   . Cardiac catheterization 09/14/2001     Mildly decreased left ventricular systolic function --  Native three vessel coronary artery disease as described. -- Status post coronary artery bypass grafting  .  Hernia repair 05/16/2001    Large left indirect inguinal hernia with right direct hernia.  Marland Kitchen Hernia repair     Recurrent left inguinal hernia -- Large left indirect inguinal hernia with right direct hernia.     No family history on file.   History   Social History  . Marital Status: Single    Spouse Name: N/A    Number of Children: N/A  . Years of Education: N/A   Occupational History  . Not on file.   Social History Main Topics  . Smoking status: Unknown If Ever Smoked  . Smokeless tobacco: Not on file  . Alcohol Use: Not on file  . Drug Use: Not on file  . Sexually Active: Not on file   Other Topics Concern  . Not on file   Social History Narrative  . No narrative on file     BP 132/72  Pulse 88  Resp 18  Ht 6\' 2"  (1.88 m)  Wt 210 lb 12.8 oz (95.618 kg)  BMI 27.07 kg/m2  Physical Exam:  Well appearing middle-aged man, NAD HEENT:  Unremarkable Neck:  No JVD, no thyromegally Lungs:  Clear with no wheezes, rales, or rhonchi. HEART:  Regular rate rhythm, no murmurs, no rubs, no clicks Abd:  soft, positive bowel sounds, no organomegally, no rebound, no guarding Ext:  2 plus pulses, no edema, no cyanosis, no clubbing Skin:  No rashes no nodules Neuro:  CN II through XII intact, motor grossly intact  EKG Normal sinus rhythm with normal axis and intervals.  Assess/Plan:

## 2012-04-25 NOTE — Telephone Encounter (Signed)
He does not want to take the Multaq prior to Myoview,  He wants to think about starting Amiodarone ad taking for a few weeks and then having a Holter monitor to show the FAA.  Wants Dr Bruna Potter thoghts

## 2012-04-27 NOTE — Telephone Encounter (Signed)
Discussed with Dr Ladona Ridgel  He is okay with the plan the FAA approves.  Will proceed as patient wishes and he has researched with the FAA     Has more information and a new plan.  Don't go but to 90% on the Stress Test per FAA MD, and we will see if he passes at that point.  If he fails he will then start Amiodarone and have a holter monitor after being on the medication.

## 2012-05-10 ENCOUNTER — Ambulatory Visit: Payer: BC Managed Care – PPO | Admitting: Internal Medicine

## 2012-05-14 ENCOUNTER — Telehealth (HOSPITAL_COMMUNITY): Payer: Self-pay | Admitting: Radiology

## 2012-05-14 NOTE — Telephone Encounter (Signed)
ok 

## 2012-05-17 ENCOUNTER — Encounter (HOSPITAL_COMMUNITY): Payer: BC Managed Care – PPO

## 2012-05-17 ENCOUNTER — Other Ambulatory Visit (HOSPITAL_COMMUNITY): Payer: BC Managed Care – PPO

## 2012-05-21 ENCOUNTER — Telehealth: Payer: Self-pay | Admitting: *Deleted

## 2012-05-21 NOTE — Telephone Encounter (Signed)
Message copied by Deliah Boston on Mon May 21, 2012  5:53 PM ------      Message from: Connye Burkitt      Created: Tue Apr 24, 2012 10:20 AM      Regarding: myoview/echo       Myoview/echo 09/06/12 @ 12:30      bcbs

## 2012-05-21 NOTE — Telephone Encounter (Signed)
Patient canceled stress test cant afford deductible

## 2012-06-11 ENCOUNTER — Ambulatory Visit: Payer: BC Managed Care – PPO | Admitting: Internal Medicine

## 2012-09-06 ENCOUNTER — Encounter (HOSPITAL_COMMUNITY): Payer: BC Managed Care – PPO

## 2012-09-06 ENCOUNTER — Other Ambulatory Visit (HOSPITAL_COMMUNITY): Payer: BC Managed Care – PPO

## 2013-07-22 ENCOUNTER — Encounter: Payer: Self-pay | Admitting: Internal Medicine

## 2013-07-22 ENCOUNTER — Ambulatory Visit (INDEPENDENT_AMBULATORY_CARE_PROVIDER_SITE_OTHER): Payer: BC Managed Care – PPO | Admitting: Internal Medicine

## 2013-07-22 VITALS — BP 144/88 | HR 95 | Temp 98.5°F | Resp 16 | Ht 74.0 in

## 2013-07-22 DIAGNOSIS — K219 Gastro-esophageal reflux disease without esophagitis: Secondary | ICD-10-CM

## 2013-07-22 DIAGNOSIS — J309 Allergic rhinitis, unspecified: Secondary | ICD-10-CM

## 2013-07-22 DIAGNOSIS — J310 Chronic rhinitis: Secondary | ICD-10-CM | POA: Insufficient documentation

## 2013-07-22 DIAGNOSIS — I10 Essential (primary) hypertension: Secondary | ICD-10-CM

## 2013-07-22 DIAGNOSIS — K279 Peptic ulcer, site unspecified, unspecified as acute or chronic, without hemorrhage or perforation: Secondary | ICD-10-CM | POA: Insufficient documentation

## 2013-07-22 DIAGNOSIS — J45990 Exercise induced bronchospasm: Secondary | ICD-10-CM

## 2013-07-22 MED ORDER — RANITIDINE HCL 150 MG PO CAPS: 150.0000 mg | ORAL_CAPSULE | Freq: Two times a day (BID) | ORAL | Status: DC

## 2013-07-22 MED ORDER — RAMIPRIL 10 MG PO CAPS: 10.0000 mg | ORAL_CAPSULE | Freq: Every day | ORAL | Status: DC

## 2013-07-22 MED ORDER — ALBUTEROL SULFATE HFA 108 (90 BASE) MCG/ACT IN AERS: 2.0000 | INHALATION_SPRAY | Freq: Four times a day (QID) | RESPIRATORY_TRACT | Status: DC | PRN

## 2013-07-22 MED ORDER — MOMETASONE FUROATE 50 MCG/ACT NA SUSP: 4.0000 | Freq: Every day | NASAL | Status: DC

## 2013-07-22 NOTE — Assessment & Plan Note (Signed)
He will continue using nasonex ns

## 2013-07-22 NOTE — Assessment & Plan Note (Signed)
He has adequate BP control He deferred on doing any labs today

## 2013-07-22 NOTE — Progress Notes (Signed)
Subjective:    Patient ID: Keith Vazquez, male    DOB: 1951/01/06, 62 y.o.   MRN: [id number]  HPI Comments: New to me, transfer from Dr. Duaine Dredge, no records are available today. He did not allow me to do a physical today.  Asthma He complains of wheezing. There is no chest tightness, cough, difficulty breathing, frequent throat clearing, hemoptysis, hoarse voice, shortness of breath or sputum production. This is a chronic problem. The current episode started more than 1 year ago. Episode frequency: about 2 times per month. The problem has been unchanged. Associated symptoms include nasal congestion, postnasal drip, rhinorrhea and sneezing. Pertinent negatives include no appetite change, chest pain, dyspnea on exertion, ear congestion, ear pain, fever, headaches, heartburn, malaise/fatigue, myalgias, orthopnea, PND, sore throat, sweats, trouble swallowing or weight loss. His symptoms are aggravated by exercise. His symptoms are alleviated by nothing. His past medical history is significant for asthma.      Review of Systems  Constitutional: Negative.  Negative for fever, chills, weight loss, malaise/fatigue, diaphoresis, activity change, appetite change, fatigue and unexpected weight change.  HENT: Positive for congestion, rhinorrhea, sneezing and postnasal drip. Negative for ear pain, nosebleeds, sore throat, hoarse voice, facial swelling, drooling, mouth sores, trouble swallowing, neck pain, dental problem, voice change, sinus pressure and tinnitus.   Eyes: Negative.   Respiratory: Positive for wheezing. Negative for apnea, cough, hemoptysis, sputum production, choking, chest tightness, shortness of breath and stridor.   Cardiovascular: Negative.  Negative for chest pain, dyspnea on exertion, palpitations, leg swelling and PND.  Gastrointestinal: Negative.  Negative for heartburn, nausea, abdominal pain, diarrhea, constipation and blood in stool.  Endocrine: Negative.   Genitourinary:  Negative.   Musculoskeletal: Negative.  Negative for myalgias.  Skin: Negative.   Allergic/Immunologic: Positive for environmental allergies. Negative for food allergies and immunocompromised state.  Neurological: Negative.  Negative for dizziness, tremors, speech difficulty, weakness, light-headedness and headaches.  Hematological: Negative.  Negative for adenopathy. Does not bruise/bleed easily.  Psychiatric/Behavioral: Negative.        Objective:   Physical Exam  Vitals reviewed. Constitutional: He is oriented to person, place, and time. He appears well-developed and well-nourished. No distress.  HENT:  Head: Normocephalic and atraumatic.  Right Ear: Hearing, tympanic membrane, external ear and ear canal normal.  Left Ear: Hearing, tympanic membrane, external ear and ear canal normal.  Nose: Mucosal edema and rhinorrhea present. No nose lacerations, sinus tenderness, nasal deformity, septal deviation or nasal septal hematoma. No epistaxis.  No foreign bodies. Right sinus exhibits no maxillary sinus tenderness and no frontal sinus tenderness. Left sinus exhibits no maxillary sinus tenderness and no frontal sinus tenderness.  Mouth/Throat: Mucous membranes are normal. Mucous membranes are not pale, not dry and not cyanotic. No oral lesions. No trismus in the jaw. No edematous. No oropharyngeal exudate, posterior oropharyngeal edema, posterior oropharyngeal erythema or tonsillar abscesses.  Eyes: Conjunctivae are normal. Right eye exhibits no discharge. Left eye exhibits no discharge. No scleral icterus.  Neck: Normal range of motion. Neck supple. No JVD present. No tracheal deviation present. No thyromegaly present.  Cardiovascular: Normal rate, regular rhythm, normal heart sounds and intact distal pulses.  Exam reveals no gallop and no friction rub.   No murmur heard. Pulmonary/Chest: Effort normal and breath sounds normal. No stridor. No respiratory distress. He has no wheezes. He has no  rales. He exhibits no tenderness.  Abdominal: Soft. Bowel sounds are normal. He exhibits no distension and no mass. There is no tenderness. There is  no rebound and no guarding.  Musculoskeletal: Normal range of motion. He exhibits no edema and no tenderness.  Lymphadenopathy:    He has no cervical adenopathy.  Neurological: He is oriented to person, place, and time.  Skin: Skin is warm and dry. No rash noted. He is not diaphoretic. No erythema. No pallor.     No results found for this basename: WBC, HGB, HCT, PLT, GLUCOSE, CHOL, TRIG, HDL, LDLDIRECT, LDLCALC, ALT, AST, NA, K, CL, CREATININE, BUN, CO2, TSH, PSA, INR, GLUF, HGBA1C, MICROALBUR       Assessment & Plan:

## 2013-07-22 NOTE — Assessment & Plan Note (Signed)
   Start albuterol inhaler

## 2013-07-22 NOTE — Patient Instructions (Signed)
Exercise-Induced Asthma   Asthma is a condition in which the airways in the lungs (bronchioles) tend to constrict more than normal due to muscle spasms. This constriction results in difficulty in breathing (shortness of breath, wheezing, or coughing). For some people the symptoms are caused or triggered by physical activity; this is known as exercise-induced asthma.  SYMPTOMS   · Shortness of breath.  · Wheezing.  · Coughing.  · Chest tightness.  · Decrease in optimal performance.  · Fatigue.  POSSIBLE TRIGGERS:  Exercise-induced asthma may occur more often when one or more of the following are present:   · Animal dander from the skin, hair, or feathers of animals.  · Dust mites contained in house dust.  · Cockroaches.  · Pollen from trees or grass.  · Mold.  · Cigarette or tobacco smoke. Smoking cannot be allowed in homes of people with asthma. People with asthma should not smoke and should not be around smokers.  · Air pollutants such as dust, household cleaners, hair sprays, aerosol sprays, paint fumes, strong chemicals, or strong odors.  · Cold air or weather changes. Cold air may cause inflammation. Winds increase molds and pollens in the air. There is not one best climate for people with asthma.  · Strong emotions such as crying or laughing hard.  · Stress.  · Certain medicines such as aspirin or beta-blockers.  · Sulfites in such foods and drinks as dried fruits and wine.  · Infections or inflammatory conditions such as the flu, a cold, or an inflammation of the nasal membranes (rhinitis).  · Gastroesophageal reflux disease (GERD). GERD is a condition where stomach acid backs up into your throat (esophagus).  · Exercise or strenous activity. Proper pre-exercise medicines allow most people to participate in sports.  PREVENTION   · Know the triggers that may increase your occurrence for exercise-induced asthma and avoid them.  · During winter you may need to exercise indoors or wear a mask if you do exercise  outdoors.  · Breathing through the nose instead of the mouth, especially in the winter.  · Warm-up for an appropriate length of time before a vigorous workout.  · Take controller and reliever medicines to control your asthma as directed.  · Follow-up with your caregiver as directed.  TREATMENT   Asthma controller and reliever medicines work well for most people suffering from exercise-induced asthma. Medicines are able to both prevent asthma attack, as well as treat attacks already happening. The most common type of medicine for asthma is called a bronchodilator. Bronchodilators act by expanding the constricted airways. The most common type of bronchodilator is albuterol and should be taken 15 to 30 minutes before physical activity and as soon as symptoms begin to appear. Additional medicines, such as cromolyn and nedocromil, may be prescribed by your caregiver. It is important for all people with asthma to use their medicines as directed by their caregiver.  Document Released: 10/17/2005 Document Revised: 10/03/2012 Document Reviewed: 01/29/2009  [employer]® Patient Information ©2014 [employer].

## 2013-09-30 ENCOUNTER — Encounter: Payer: Self-pay | Admitting: Internal Medicine

## 2013-09-30 ENCOUNTER — Other Ambulatory Visit (INDEPENDENT_AMBULATORY_CARE_PROVIDER_SITE_OTHER): Payer: BC Managed Care – PPO

## 2013-09-30 ENCOUNTER — Ambulatory Visit (INDEPENDENT_AMBULATORY_CARE_PROVIDER_SITE_OTHER): Payer: BC Managed Care – PPO | Admitting: Internal Medicine

## 2013-09-30 VITALS — BP 110/78 | HR 84 | Temp 97.1°F | Resp 16 | Ht 74.0 in | Wt 210.0 lb

## 2013-09-30 DIAGNOSIS — R0600 Dyspnea, unspecified: Secondary | ICD-10-CM | POA: Insufficient documentation

## 2013-09-30 DIAGNOSIS — I2510 Atherosclerotic heart disease of native coronary artery without angina pectoris: Secondary | ICD-10-CM

## 2013-09-30 DIAGNOSIS — I10 Essential (primary) hypertension: Secondary | ICD-10-CM

## 2013-09-30 DIAGNOSIS — E785 Hyperlipidemia, unspecified: Secondary | ICD-10-CM

## 2013-09-30 DIAGNOSIS — I2589 Other forms of chronic ischemic heart disease: Secondary | ICD-10-CM

## 2013-09-30 DIAGNOSIS — J309 Allergic rhinitis, unspecified: Secondary | ICD-10-CM

## 2013-09-30 DIAGNOSIS — R0609 Other forms of dyspnea: Secondary | ICD-10-CM

## 2013-09-30 DIAGNOSIS — I4891 Unspecified atrial fibrillation: Secondary | ICD-10-CM

## 2013-09-30 LAB — LDL CHOLESTEROL, DIRECT: Direct LDL: 120.5 mg/dL

## 2013-09-30 LAB — CBC WITH DIFFERENTIAL/PLATELET
Basophils Absolute: 0 10*3/uL (ref 0.0–0.1)
Eosinophils Absolute: 0.1 10*3/uL (ref 0.0–0.7)
Eosinophils Relative: 1.2 % (ref 0.0–5.0)
HCT: 46.6 % (ref 39.0–52.0)
Hemoglobin: 15.9 g/dL (ref 13.0–17.0)
Lymphocytes Relative: 20.5 % (ref 12.0–46.0)
Lymphs Abs: 1.7 10*3/uL (ref 0.7–4.0)
MCHC: 34.1 g/dL (ref 30.0–36.0)
Monocytes Absolute: 0.6 10*3/uL (ref 0.1–1.0)
Monocytes Relative: 6.7 % (ref 3.0–12.0)
Neutrophils Relative %: 71.4 % (ref 43.0–77.0)
Platelets: 178 10*3/uL (ref 150.0–400.0)
RDW: 13.4 % (ref 11.5–14.6)
WBC: 8.3 10*3/uL (ref 4.5–10.5)

## 2013-09-30 LAB — COMPREHENSIVE METABOLIC PANEL
CO2: 23 mEq/L (ref 19–32)
Calcium: 9.8 mg/dL (ref 8.4–10.5)
Chloride: 106 mEq/L (ref 96–112)
Creatinine, Ser: 1.2 mg/dL (ref 0.4–1.5)
GFR: 66.55 mL/min (ref >60.00)
Glucose, Bld: 107 mg/dL — ABNORMAL HIGH (ref 70–99)
Sodium: 138 mEq/L (ref 135–145)
Total Bilirubin: 0.7 mg/dL (ref 0.3–1.2)
Total Protein: 7.8 g/dL (ref 6.0–8.3)

## 2013-09-30 LAB — CARDIAC PANEL
CK-MB: 2.3 ng/mL (ref 0.3–4.0)
Relative Index: 2.3 calc (ref 0.0–2.5)
Total CK: 102 U/L (ref 7–232)

## 2013-09-30 LAB — LIPID PANEL
Cholesterol: 202 mg/dL — ABNORMAL HIGH (ref 0–200)
Triglycerides: 293 mg/dL — ABNORMAL HIGH (ref 0.0–149.0)

## 2013-09-30 LAB — TSH: TSH: 1.1 u[IU]/mL (ref 0.35–5.50)

## 2013-09-30 LAB — TROPONIN I: Troponin I: 0.01 ng/mL (ref <0.06)

## 2013-09-30 MED ORDER — RAMIPRIL 10 MG PO CAPS: 10.0000 mg | ORAL_CAPSULE | Freq: Every day | ORAL | Status: DC

## 2013-09-30 MED ORDER — AZELASTINE HCL 0.1 % NA SOLN: 2.0000 | Freq: Two times a day (BID) | NASAL | Status: DC

## 2013-09-30 NOTE — Assessment & Plan Note (Signed)
His EKG today shows an old infarct in the anterior leads, there are no acute st/t wave changes I will check his labs today to look for ischemia, fluid overload, secondary causes of dyspnea He will see cardiology for further evaluation and if his cardiac condition does not explain the nocturnal gasping then I will ask him to have a sleep    study done to look for sleep apnea

## 2013-09-30 NOTE — Assessment & Plan Note (Signed)
FLP CMP TSH today 

## 2013-09-30 NOTE — Progress Notes (Signed)
Pre visit review using our clinic review tool, if applicable. No additional management support is needed unless otherwise documented below in the visit note. 

## 2013-09-30 NOTE — Assessment & Plan Note (Signed)
He is due for a cardiology f/up 

## 2013-09-30 NOTE — Progress Notes (Signed)
Subjective:    Patient ID: Keith Vazquez, male    DOB: May 07, 1951, 62 y.o.   MRN: [id number]  Shortness of Breath This is a chronic problem. The current episode started more than 1 year ago. The problem occurs intermittently. The problem has been unchanged. Associated symptoms include rhinorrhea. Pertinent negatives include no abdominal pain, chest pain, claudication, coryza, ear pain, fever, headaches, hemoptysis, leg pain, leg swelling, neck pain, orthopnea, PND, rash, sore throat, sputum production, swollen glands, syncope, vomiting or wheezing. Nothing aggravates the symptoms. Associated symptoms comments: He has some gasping at night. His past medical history is significant for CAD and a heart failure. There is no history of allergies, aspirin allergies, asthma, bronchiolitis, chronic lung disease, COPD, DVT, PE, pneumonia or a recent surgery.      Review of Systems  Constitutional: Negative.  Negative for fever, chills, diaphoresis, activity change, appetite change, fatigue and unexpected weight change.  HENT: Positive for congestion and rhinorrhea. Negative for ear discharge, ear pain, facial swelling, postnasal drip, sinus pressure, sneezing, sore throat, tinnitus, trouble swallowing and voice change.   Eyes: Negative.   Respiratory: Positive for shortness of breath. Negative for apnea, cough, hemoptysis, sputum production, choking, chest tightness, wheezing and stridor.   Cardiovascular: Negative.  Negative for chest pain, orthopnea, claudication, leg swelling, syncope and PND.  Gastrointestinal: Negative.  Negative for nausea, vomiting, abdominal pain, diarrhea and constipation.  Endocrine: Negative.   Genitourinary: Negative.   Musculoskeletal: Negative.  Negative for neck pain.  Skin: Negative.  Negative for rash.  Allergic/Immunologic: Negative.   Neurological: Negative.  Negative for headaches.  Hematological: Negative.   Psychiatric/Behavioral: Negative.          Objective:   Physical Exam  Vitals reviewed. Constitutional: He is oriented to person, place, and time. He appears well-developed and well-nourished.  Non-toxic appearance. He does not have a sickly appearance. He does not appear ill. No distress.  HENT:  Head: Normocephalic and atraumatic.  Nose: Mucosal edema and rhinorrhea present. No nose lacerations, sinus tenderness, nasal deformity, septal deviation or nasal septal hematoma. No epistaxis.  No foreign bodies. Right sinus exhibits no maxillary sinus tenderness and no frontal sinus tenderness. Left sinus exhibits no maxillary sinus tenderness and no frontal sinus tenderness.  Mouth/Throat: Oropharynx is clear and moist. No oropharyngeal exudate.  Eyes: Conjunctivae are normal. Right eye exhibits no discharge. Left eye exhibits no discharge. No scleral icterus.  Neck: Normal range of motion. Neck supple. No JVD present. No tracheal deviation present. No thyromegaly present.  Cardiovascular: Normal rate, regular rhythm, normal heart sounds and intact distal pulses.  Exam reveals no gallop and no friction rub.   No murmur heard. Pulmonary/Chest: Effort normal and breath sounds normal. No stridor. No respiratory distress. He has no wheezes. He has no rales. He exhibits no tenderness.  Abdominal: Soft. Bowel sounds are normal. He exhibits no distension and no mass. There is no tenderness. There is no rebound and no guarding.  Musculoskeletal: Normal range of motion. He exhibits no edema and no tenderness.  Lymphadenopathy:    He has no cervical adenopathy.  Neurological: He is oriented to person, place, and time.  Skin: Skin is warm and dry. No rash noted. He is not diaphoretic. No erythema. No pallor.  Psychiatric: He has a normal mood and affect. His behavior is normal. Judgment and thought content normal.     No results found for this basename: WBC, HGB, HCT, PLT, GLUCOSE, CHOL, TRIG, HDL, LDLDIRECT, LDLCALC, ALT, AST,  NA, K, CL,  CREATININE, BUN, CO2, TSH, PSA, INR, GLUF, HGBA1C, MICROALBUR       Assessment & Plan:

## 2013-09-30 NOTE — Assessment & Plan Note (Signed)
He did not get much relief from the steroid ns so I have asked him to start astelin ns

## 2013-09-30 NOTE — Assessment & Plan Note (Signed)
His BP is well controlled 

## 2013-09-30 NOTE — Patient Instructions (Signed)

## 2013-10-01 ENCOUNTER — Encounter: Payer: Self-pay | Admitting: Internal Medicine

## 2013-10-04 ENCOUNTER — Ambulatory Visit: Payer: BC Managed Care – PPO | Admitting: Cardiology

## 2013-10-29 ENCOUNTER — Encounter: Payer: Self-pay | Admitting: Cardiology

## 2013-10-29 ENCOUNTER — Ambulatory Visit (INDEPENDENT_AMBULATORY_CARE_PROVIDER_SITE_OTHER): Payer: BC Managed Care – PPO | Admitting: Cardiology

## 2013-10-29 VITALS — BP 110/86 | HR 78 | Ht 74.0 in | Wt 210.1 lb

## 2013-10-29 DIAGNOSIS — I2510 Atherosclerotic heart disease of native coronary artery without angina pectoris: Secondary | ICD-10-CM

## 2013-10-29 DIAGNOSIS — I4891 Unspecified atrial fibrillation: Secondary | ICD-10-CM

## 2013-10-29 DIAGNOSIS — I519 Heart disease, unspecified: Secondary | ICD-10-CM

## 2013-10-29 DIAGNOSIS — I5022 Chronic systolic (congestive) heart failure: Secondary | ICD-10-CM

## 2013-10-29 MED ORDER — CARVEDILOL 3.125 MG PO TABS: 3.1250 mg | ORAL_TABLET | Freq: Two times a day (BID) | ORAL | Status: DC

## 2013-10-29 NOTE — Progress Notes (Addendum)
Patient ID: Keith Vazquez MRN: [id number], DOB/AGE: 64-18-1952   Date of Visit: 10/29/2013  Primary Physician: Sanda Linger, MD Primary Cardiologist: Lewayne Bunting, MD Reason for Visit: SOB  History of Present Illness  Keith Vazquez is a 62 y.o. male with PAF (initially diagnosed during stress test 2002), CAD s/p MI, CABG in 2000 (4/5 grafts patent by cath 2002; LIMA to LAD was atretic and occluded but distal vessel adequately filled by collaterals from diagonal) and HTN who presents today referred by his PCP for evaluation of SOB. He has not seen Dr. Ladona Ridgel since June 2013. He has seen multiple cardiologists over the years including [facility], [facility], [facility], [facility] and Viann Fish, MD. He has some of these records with him today.   He insists that he does not have atrial fibrillation, stating "You're going to have to prove it to me." He states I've never had heart failure. However, he understands his LVEF has been low over the years. He denies CP. He works out at [employer] regularly and is able to ride the stationery bike for 10-12 minutes at a time without difficulty and he uses the treadmill occasionally. He states "I stop at 10-12 minutes just because I'm bored." He denies CP or DOE. He denies palpitations, dizziness, near syncope or syncope. He denies LE swelling or orthopnea. His only complaint today is occasional "gasping for air" during sleep. He has had this symptom for years but it seems to be occurring more frequently, now once weekly. He was previously taking carvedilol but doesn't recall why he is not taking this now. He has never been on anticoagulation; although notes from Dr. Brayton Mars previously state Coumadin was recommended. In addition, per Dr. Lubertha Basque note from June 2013 anticoagulation was recommended. Keith Vazquez states he refused anticoagulation because he does not believe he has atrial fibrillation. 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 He states, "Dr. Ladona Ridgel is going to have to write a note to the [facility] after all of this is done."    Past Medical History Past Medical History  Diagnosis Date  . Hypertension     Borderline  . Degeneration of lumbar intervertebral disc   . GERD (gastroesophageal reflux disease)   . Ischemic heart disease     s/p MI, CABG 2000, s/p cath 2002 with 4/5 grafts patent (LIMA to LAD atretic and occluded mid vessel but adequate flow to distal LAD from collaterals / diagonal  . Low back pain   . Peyronie's disease   . PAC (premature atrial contraction)   . PAF (paroxysmal atrial fibrillation)     initially diagnosed during stress test 2002  . Coronary artery disease     CABG x5 in 2000; s/p cath 2002 > 4/5 grafts patent, LVEF 50%  . CHF (congestive heart failure)     LVEF at time of cath 2002 50%; now has been 35-40% since 2008 (patient has seen multiple cardiologists over the years including [facility], [facility], [facility], [facility] and Viann Fish, MD)    Past Surgical History Past Surgical History  Procedure Laterality Date  .  Cardiac catheterization  09/14/2001     Mildly decreased left ventricular systolic function --  Native three vessel coronary artery disease as described. -- Status post coronary artery bypass grafting  . Hernia repair  05/16/2001    Large left indirect inguinal hernia with right direct hernia.  Marland Kitchen Hernia repair      Recurrent left inguinal hernia -- Large left indirect inguinal hernia with right direct hernia.  . Coronary artery bypass graft  2000    LIMA to LAD, SVG to second diagonal, seq SVG to  ramus and OM, SVG to PDA    Allergies/Intolerances Allergies  Allergen Reactions  . Oxycodone-Acetaminophen   . Propoxyphene N-Acetaminophen     Current Home Medications Current Outpatient Prescriptions  Medication Sig Dispense Refill  . Ascorbic Acid (VITAMIN C) 100 MG tablet Take 100 mg by mouth daily.        Marland Kitchen azelastine (ASTELIN) 137 MCG/SPRAY nasal spray Place 2 sprays into both nostrils 2 (two) times daily. Use in each nostril as directed  30 mL  12  . b complex vitamins capsule Take 1 capsule by mouth daily.        . celecoxib (CELEBREX) 200 MG capsule Take 200 mg by mouth as needed.       . Cholecalciferol (VITAMIN D) 1000 UNITS capsule Take 1,000 Units by mouth daily.        . Flavocoxid (LIMBREL) 500 MG CAPS Take 500 mg by mouth daily.        . niacin 250 MG CR capsule Take 250 mg by mouth daily.        . Omega-3 Fatty Acids (FISH OIL PO) Take 1 tablet by mouth daily.        . ramipril (ALTACE) 10 MG capsule Take 1 capsule (10 mg total) by mouth daily.  90 capsule  1  . ranitidine (ZANTAC) 150 MG capsule Take 1 capsule (150 mg total) by mouth 2 (two) times daily.  180 capsule  1  . SUMAtriptan (IMITREX) 100 MG tablet       . carvedilol (COREG) 3.125 MG tablet Take 1 tablet (3.125 mg total) by mouth 2 (two) times daily.  180 tablet  3   No current facility-administered medications for this visit.    Social History History   Social History  . Marital Status: Single    Spouse Name: N/A    Number of Children: N/A  . Years of Education: N/A   Occupational History  . Not on file.   Social History Main Topics  . Smoking status: Former Games developer  . Smokeless tobacco: Not on file  . Alcohol Use: Not on file  . Drug Use: No  . Sexual Activity: Not on file   Other Topics Concern  . Not on file   Social History Narrative  . No narrative on file     Review of Systems General: No chills, fever, night sweats or weight changes Cardiovascular: No chest pain, dyspnea on  exertion, edema, orthopnea, palpitations, paroxysmal nocturnal dyspnea Dermatological: No rash, lesions or masses Respiratory: No cough, dyspnea Urologic: No hematuria, dysuria Abdominal: No nausea, vomiting, diarrhea, bright red blood per rectum, melena, or hematemesis Neurologic: No visual changes, weakness, changes in mental status All other systems reviewed and are otherwise negative except as noted above.  Physical Exam Vitals: Blood pressure 110/86, pulse 78, height 6\' 2"  (1.88 m), weight 210 lb 1.9 oz (95.31 kg), SpO2 98.00%.  General: Well developed, well appearing 62 y.o. male in no  acute distress. HEENT: Normocephalic, atraumatic. EOMs intact. Sclera nonicteric. Oropharynx clear.  Neck: Supple. No JVD. Lungs: Respirations regular and unlabored, CTA bilaterally. No wheezes, rales or rhonchi. Heart: Irregular. S1, S2 present. No murmurs, rub, S3 or S4. Abdomen: Soft, non-tender, non-distended. BS present x 4 quadrants. No hepatosplenomegaly.  Extremities: No clubbing, cyanosis or edema. PT/Radials 2+ and equal bilaterally. Psych: Normal affect. Neuro: Alert and oriented X 3. Moves all extremities spontaneously.   Diagnostics  Cardiac cath 2002 HEMODYNAMICS: Left ventricular pressure 104/16, aortic pressure  104/62. There was no aortic valve gradient.  LEFT VENTRICULOGRAM: There is moderate hypokinesis of the inferior wall.  Ejection fraction is calculated at 51%. No mitral regurgitation.  CORONARY ARTERIOGRAPHY: (Right dominant)  - Left main has a distal 50% stenosis.  - Left anterior descending artery has a 70% stenosis in the proximal vessel and  a tubular 90% stenosis in the midvessel between the first and second diagonal  branches. There is a small first diagonal and large second diagonal.  - Left circumflex has a 50% stenosis at its origin and 100% occlusion in the  midvessel. Proximal to the occlusion there is a large ramus intermediate  which has a 30% stenosis  proximally. There is also a normal size obtuse  marginal arising distal to the occlusion which fills via saphenuos vein graft.  - Right coronary artery is 100% occluded at its origin.  - Left internal mammary artery to the distal LAD is very atretic proximally and  100% occluded in the midvessel.  - Saphenuos vein graft to the second diagonal branch is patent. This fills a  normal size second diagonal with retrograde filling to the mid and distal LAD.  The distal LAD appears to get adequate blood flow via this vein graft.  Sequential saphenuos vein graft to the ramus intermediate and the obtuse  marginal is patent. There is a 20% stenosis in the proximal portion of this  vein and some mild ectasia in the distal portion of this vein graft at what  appears to be a venous valve. This fills a large size ramus intermediate and  a normal size obtuse marginal.  - Saphenuos vein graft to the posterior descending artery is patent throughout  its course with mild ectasia in the proximal graft. This fills a large  posterior descending artery. This also fills retrograde to the mid right  coronary artery as well as into the distal AV groove portion of the right  coronary artery which supplies two small posterolateral branches. There is a  tubular 80% stenosis in the proximal portion of the posterior descending  artery which does jeopardize a small posterolateral branches.  IMPRESSION:  1. Mildly decreased left ventricular systolic function.  2. Native three vessel coronary artery disease as described.  3. Status post coronary artery bypass grafting with all three saphenuos vein  grafts patent. The left internal mammary artery is atretic and occluded  in the midvessel. This is likely secondary to competitive flow into the  distal LAD via the vein graft to the diagonal branch. The distal LAD  does appear to be adequately perfused by this vein graft to the diagonal  There is a stenosis in the proximal  posterior descending artery which is  proximal to the vein graft insertion site. This is likely responsible for  the mild high lateral wall ischemia seen on Cardiolite. This is a very  small territory of myocardium being perfused by a very small posterolateral  branches and does not appear  to be clinically significant.  PLAN: The patient will be continued on medical therapy.  Echocardiogram August 2013 from Dr. York Spaniel office Moderate concentric LVH. Moderate global hypokinesis. LVEF 35-40%. Grade I diastolic dysfunction. Mild LAE 43 mm. Normal RV size and function. No valvular abnormalities.   12-lead ECG today - atrial fibrillation at 121 bpm; inferior Q waves; no ST-T wave abnormalities; QRS 108, QT/QTc 316/448  Assessment and Plan  1. Atrial fibrillation - rate per vitals within normal range but elevated during ECG - asymptomatic; duration unknown - in setting of LV dysfunction, will start carvedilol 3.125 mg twice daily (SBP 110 today) - update echo to assess LV function - order 48-hour Holter to evaluate AF burden and rate control - CHADS2-VASc score is 3 so needs anticoagulation for stroke prevention; reviewed indications and need for anticoagulation; he is hesitant to start anticoagulation; he has no history of bleeding or blood disorder; as these medications, specifically Coumadin, have been mentioned to him in the past, he has refused; I urged him to reconsider anticoagulation due to his increased risk of stroke  - return to clinic for follow-up in 2-3 weeks  2. LV dysfunction - LVEF by cath in 2002 50% (6 months post CABG at that time) - LVEF worsened by echo 2008 and every year thereafter, 35-40% consistently (has been seen by multiple cardiologists in that time and I reviewed records provided by patient) - update echo to assess LV function - does not appear volume overloaded today - continue ramipril and add carvedilol today - order treadmill Myoview stress test for cardiac  risk stratification  3. CAD s/p MI, CABG - continue medical therapy - order treadmill Myoview stress test for cardiac risk stratification  Signed, Rick Duff, PA-C 10/29/2013, 6:38 PM  ADDENDUM: Keith Vazquez presented for follow-up on 11/19/2013 to discuss results of stress test and 48-hour Holter monitor. However, he completed his stress test and returned monitor yesterday on 11/18/2013 - results are pending. Apparently I am not able to access/view his monitor results until 24 hours after download which will be 11/20/2013. Therefore, Keith Vazquez follow-up has been rescheduled with Dr. Ladona Ridgel this Friday 11/22/2013. Of note, ECG today shows NSR at 73 bpm.  Baila Rouse, PA-C 11/19/2013 1:47 PM

## 2013-10-29 NOTE — Patient Instructions (Addendum)
Your physician recommends that you schedule a follow-up appointment in: 4-6 WEEKS WITH DR. Ladona Ridgel (AFTER ALL TEST)  Your physician has requested that you have en exercise stress myoview. For further information please visit [email or web address] Please follow instruction sheet, as given.  Your physician has requested that you have an echocardiogram. Echocardiography is a painless test that uses sound waves to create images of your heart. It provides your doctor with information about the size and shape of your heart and how well your heart's chambers and valves are working. This procedure takes approximately one hour. There are no restrictions for this procedure.  Your physician has recommended that you wear a 48 HR holter monitor. Holter monitors are medical devices that record the heart's electrical activity. Doctors most often use these monitors to diagnose arrhythmias. Arrhythmias are problems with the speed or rhythm of the heartbeat. The monitor is a small, portable device. You can wear one while you do your normal daily activities. This is usually used to diagnose what is causing palpitations/syncope (passing out).  Your physician has recommended you make the following change in your medication:   START COREG 3.125 MG TWO TIMES A DAY (TWELVE HOURS APARTMENT)  Your physician recommends that you continue on your current medications as directed. Please refer to the Current Medication list given to you today.

## 2013-10-30 ENCOUNTER — Telehealth: Payer: Self-pay | Admitting: *Deleted

## 2013-10-30 NOTE — Telephone Encounter (Signed)
Called pt to inform him that on his EKG form yesterday there was findings of A.Fib and Physician Assistant Rick Duff would like for him to make an appointment to discuss EKG and possible Anticoagulation start up. Pt agreed to come in on January 20th at 9:00am on the day Dr. Ladona Ridgel is in office. Pt stated he started his Coreg and did not feel dizzy when he climbed up a ladder today.  Asked pt if he had considered starting an anticoagulation and he stated that would really like to avoid taking any blood thinners due to his work but if he have to he will consider it. Informed pt that he can discuss this with Nehemiah Settle and Dr.Taylor on his Jan 20 th appointment and pt agreed. And sent a staff message to Brady to inform her of this.

## 2013-11-12 ENCOUNTER — Encounter: Payer: Self-pay | Admitting: Internal Medicine

## 2013-11-14 ENCOUNTER — Ambulatory Visit (HOSPITAL_COMMUNITY): Payer: BC Managed Care – PPO | Attending: Cardiology | Admitting: Radiology

## 2013-11-14 ENCOUNTER — Encounter: Payer: Self-pay | Admitting: *Deleted

## 2013-11-14 ENCOUNTER — Encounter: Payer: Self-pay | Admitting: Cardiology

## 2013-11-14 ENCOUNTER — Encounter (INDEPENDENT_AMBULATORY_CARE_PROVIDER_SITE_OTHER): Payer: BC Managed Care – PPO

## 2013-11-14 DIAGNOSIS — I4891 Unspecified atrial fibrillation: Secondary | ICD-10-CM

## 2013-11-14 DIAGNOSIS — I509 Heart failure, unspecified: Secondary | ICD-10-CM | POA: Insufficient documentation

## 2013-11-14 DIAGNOSIS — I2510 Atherosclerotic heart disease of native coronary artery without angina pectoris: Secondary | ICD-10-CM | POA: Insufficient documentation

## 2013-11-14 DIAGNOSIS — I10 Essential (primary) hypertension: Secondary | ICD-10-CM | POA: Insufficient documentation

## 2013-11-14 DIAGNOSIS — Z951 Presence of aortocoronary bypass graft: Secondary | ICD-10-CM | POA: Insufficient documentation

## 2013-11-14 DIAGNOSIS — I519 Heart disease, unspecified: Secondary | ICD-10-CM | POA: Insufficient documentation

## 2013-11-14 NOTE — Progress Notes (Signed)
Echocardiogram performed.  

## 2013-11-14 NOTE — Progress Notes (Signed)
Patient ID: Keith Vazquez, male   DOB: [birth year], 63 y.o.   MRN: [id number] E-Cardio48 hour holter monitor applied to pa tient.

## 2013-11-18 ENCOUNTER — Encounter: Payer: Self-pay | Admitting: Cardiovascular Disease

## 2013-11-18 ENCOUNTER — Ambulatory Visit (HOSPITAL_COMMUNITY): Payer: BC Managed Care – PPO | Attending: Cardiovascular Disease | Admitting: Radiology

## 2013-11-18 VITALS — BP 135/92 | HR 86 | Ht 74.0 in | Wt 202.0 lb

## 2013-11-18 DIAGNOSIS — E785 Hyperlipidemia, unspecified: Secondary | ICD-10-CM | POA: Insufficient documentation

## 2013-11-18 DIAGNOSIS — I252 Old myocardial infarction: Secondary | ICD-10-CM | POA: Insufficient documentation

## 2013-11-18 DIAGNOSIS — I4891 Unspecified atrial fibrillation: Secondary | ICD-10-CM | POA: Insufficient documentation

## 2013-11-18 DIAGNOSIS — I2510 Atherosclerotic heart disease of native coronary artery without angina pectoris: Secondary | ICD-10-CM | POA: Insufficient documentation

## 2013-11-18 DIAGNOSIS — Z951 Presence of aortocoronary bypass graft: Secondary | ICD-10-CM | POA: Insufficient documentation

## 2013-11-18 DIAGNOSIS — I10 Essential (primary) hypertension: Secondary | ICD-10-CM | POA: Insufficient documentation

## 2013-11-18 DIAGNOSIS — Z87891 Personal history of nicotine dependence: Secondary | ICD-10-CM | POA: Insufficient documentation

## 2013-11-18 MED ORDER — TECHNETIUM TC 99M SESTAMIBI GENERIC - CARDIOLITE
30.0000 | Freq: Once | INTRAVENOUS | Status: AC | PRN
  Administered 2013-11-18: 30 via INTRAVENOUS

## 2013-11-18 MED ORDER — TECHNETIUM TC 99M SESTAMIBI GENERIC - CARDIOLITE
10.0000 | Freq: Once | INTRAVENOUS | Status: AC | PRN
  Administered 2013-11-18: 10 via INTRAVENOUS

## 2013-11-18 NOTE — Progress Notes (Signed)
[facility] [address] [place], [place] [address] [phone]    Cardiology Nuclear Med Study  Keith Vazquez is a 63 y.o. male     MRN : [id number]     DOB: [birth year]  Procedure Date: 11/18/2013  Nuclear Med Background Indication for Stress Test:  Evaluation for Ischemia, Graft Patency and FAA Clearance History:  CAD, MI, Cath 2002, CABG x5, AFib, Echo 2013 EF 35-40%, MPI 2012 EF 36% (scar) Cardiac Risk Factors: History of Smoking, Hypertension and Lipids  Symptoms:  None indicated   Nuclear Pre-Procedure Caffeine/Decaff Intake:  None > 12 hrs NPO After: 8:00pm   Lungs:  clear O2 Sat: 99% on room air. IV 0.9% NS with Angio Cath:  22g  IV Site: R Antecubital x 1, tolerated well IV Started by:  Irven Baltimore, RN  Chest Size (in):  42 Cup Size: n/a  Height: 6\' 2"  (1.88 m)  Weight:  202 lb (91.627 kg)  BMI:  Body mass index is 25.92 kg/(m^2). Tech Comments:  Held Coreg x 24 hrs    Nuclear Med Study 1 or 2 day study: 1 day  Stress Test Type:  Stress  Reading MD: N/A  Order Authorizing Provider:  Cristopher Peru, MD, and Ileene Hutchinson, PAC  Resting Radionuclide: Technetium 80m Sestamibi  Resting Radionuclide Dose: 11.0 mCi   Stress Radionuclide:  Technetium 50m Sestamibi  Stress Radionuclide Dose: 33.0 mCi           Stress Protocol Rest HR: 86 Stress HR: 171  Rest BP: 135/92 Stress BP: 196/81  Exercise Time (min): 10:00 METS: 11.7           Dose of Adenosine (mg):  n/a Dose of Lexiscan: n/a mg  Dose of Atropine (mg): n/a Dose of Dobutamine: n/a mcg/kg/min (at max HR)  Stress Test Technologist: Glade Lloyd, BS-ES  Nuclear Technologist:  Charlton Amor, CNMT     Rest Procedure:  Myocardial perfusion imaging was performed at rest 45 minutes following the intravenous administration of Technetium 63m Sestamibi. Rest ECG: SR, PACs, old anterior MI  Stress Procedure:  The patient exercised on the treadmill utilizing the Bruce Protocol  for 10:00 minutes. The patient stopped due to fatigue and denied any chest pain.  Technetium 38m Sestamibi was injected at peak exercise and myocardial perfusion imaging was performed after a brief delay. Stress ECG: No significant change from baseline ECG. There is a run of atrial fibrillation in the recovery lasting approximately 1 minute followed by a flutter with 2:1 conduction that lasted till 9 minutes of recovery, followed by atrial fibrillation.  QPS Raw Data Images:  Normal; no motion artifact; normal heart/lung ratio. Stress Images:  There is severely decreased uptake in the basal inferoseptal, basal and mid  inferior and inferolateral walls and apical inferior and anterior walls and in the true apex.  Rest Images:  There is severely decreased uptake in the basal inferoseptal, basal and mid  inferior and inferolateral walls and apical inferior and anterior walls and in the true apex.  Subtraction (SDS):  No evidence of ischemia. Transient Ischemic Dilatation (Normal <1.22):  0.87 Lung/Heart Ratio (Normal <0.45):  0.41  Quantitative Gated Spect Images QGS EDV:  168 ml QGS ESV:  119 ml  Impression Exercise Capacity:  Excellent exercise capacity. BP Response:  Hypertensive blood pressure response. Clinical Symptoms:  No significant symptoms noted. ECG Impression:  No significant ST segment change suggestive of ischemia. Atrial fibrillation and atrial flutter in ercovery Comparison  with Prior Nuclear Study: No significant change from previous study  Overall Impression:  High risk stress nuclear study with severe systolic left ventricular dysfunction (LVEF) and findings consistent with a large scar in the RCA and distal LAD territory with no evidence of ischemia. Good exercise capacity..  LV Ejection Fraction: 29%.  LV Wall Motion:  There is duffuse hypokinesis with akinesis of the basal and mid inferior and inferolateral walls.   Ena Dawley, H 11/19/2013

## 2013-11-19 ENCOUNTER — Ambulatory Visit (INDEPENDENT_AMBULATORY_CARE_PROVIDER_SITE_OTHER): Payer: BC Managed Care – PPO | Admitting: Cardiology

## 2013-11-19 ENCOUNTER — Encounter: Payer: Self-pay | Admitting: Cardiology

## 2013-11-19 ENCOUNTER — Encounter (INDEPENDENT_AMBULATORY_CARE_PROVIDER_SITE_OTHER): Payer: Self-pay

## 2013-11-19 VITALS — BP 118/80 | HR 77 | Ht 74.0 in | Wt 209.0 lb

## 2013-11-19 DIAGNOSIS — I2510 Atherosclerotic heart disease of native coronary artery without angina pectoris: Secondary | ICD-10-CM

## 2013-11-19 NOTE — Patient Instructions (Addendum)
Your physician recommends that you schedule a follow-up appointment on 11/22/13 at 4:15pm with Dr. Lovena Le  Your physician recommends that you continue on your current medications as directed. Please refer to the Current Medication list given to you today.

## 2013-11-19 NOTE — Progress Notes (Signed)
[name] presented for follow-up today on 11/19/2013 to discuss results of stress test and 48-hour Holter monitor. However, he completed his stress test and returned monitor yesterday on 11/18/2013 - results are pending. Apparently I am not able to access/view his monitor results until 24 hours after download which will be 11/20/2013. Therefore, Keith Vazquez follow-up has been rescheduled with Dr. Lovena Le this Friday 11/22/2013. Of note, ECG today shows NSR at 73 bpm. Please see my office note dated 10/29/2013.  Ileene Hutchinson, PA-C 11/19/2013 1:48 PM

## 2013-11-22 ENCOUNTER — Ambulatory Visit (INDEPENDENT_AMBULATORY_CARE_PROVIDER_SITE_OTHER): Payer: BC Managed Care – PPO | Admitting: Internal Medicine

## 2013-11-22 ENCOUNTER — Encounter: Payer: Self-pay | Admitting: *Deleted

## 2013-11-22 ENCOUNTER — Encounter: Payer: Self-pay | Admitting: Internal Medicine

## 2013-11-22 VITALS — BP 138/80 | HR 70 | Ht 74.0 in | Wt 207.0 lb

## 2013-11-22 DIAGNOSIS — I2589 Other forms of chronic ischemic heart disease: Secondary | ICD-10-CM

## 2013-11-22 DIAGNOSIS — E785 Hyperlipidemia, unspecified: Secondary | ICD-10-CM

## 2013-11-22 DIAGNOSIS — I4891 Unspecified atrial fibrillation: Secondary | ICD-10-CM

## 2013-11-22 NOTE — Assessment & Plan Note (Signed)
I have recommended he start high dose statin therapy. Will prescribe lipitor.

## 2013-11-22 NOTE — Assessment & Plan Note (Signed)
I anticipate that he will require an anti-arrhythmic drug once he has undergone catheterization.

## 2013-11-22 NOTE — Assessment & Plan Note (Addendum)
He has worsening LV function and a high risk stress test. I have recommended heart catheterization in the next few days. The patient states that he will have to pay several thousand dollars more for this procedure now but much less if he waits until March. I have advised against this and told him he could die suddenly or sustain a large heart attack. After an extensive period of reflection, he is willing to proceed with catheterization. Will schedule the procedure at that time.

## 2013-11-22 NOTE — Patient Instructions (Addendum)
Your physician has recommended you make the following change in your medication:   1) Start Aspirin 325 mg daily   No heavy lifting No exercise  Your physician has requested that you have a cardiac catheterization. Cardiac catheterization is used to diagnose and/or treat various heart conditions. Doctors may recommend this procedure for a number of different reasons. The most common reason is to evaluate chest pain. Chest pain can be a symptom of coronary artery disease (CAD), and cardiac catheterization can show whether plaque is narrowing or blocking your heart's arteries. This procedure is also used to evaluate the valves, as well as measure the blood flow and oxygen levels in different parts of your heart. For further information please visit [email or web address]. Please follow instruction sheet, as given.

## 2013-11-22 NOTE — Progress Notes (Signed)
HPI Keith Vazquez returns today for followup. He is a pleasant 63 yo man with a h/o CAD, s/p CABG, HTN, dyslipidemia and PAF. The patient has had some worsening but intermittant sob over the past few weeks. He had a stress test where he walked through stage 3 of a Bruce protocol, stopping because his heart rate was increased. The perfusion images demonstrated that he had a large defect in the inferior and lateral wall with lateral reversibility. He has worn a cardiac monitor which demonstrated PAF with an RVR. His EF is reduced. Despite this he continues to exercise. He has dyspnea with exertion which he downplays as mild. He denies syncope.  Allergies  Allergen Reactions  . Oxycodone-Acetaminophen Nausea Only  . Propoxyphene N-Acetaminophen      Current Outpatient Prescriptions  Medication Sig Dispense Refill  . Ascorbic Acid (VITAMIN C) 100 MG tablet Take 100 mg by mouth daily.        Marland Kitchen azelastine (ASTELIN) 137 MCG/SPRAY nasal spray Place 2 sprays into both nostrils 2 (two) times daily. Use in each nostril as directed  30 mL  12  . b complex vitamins capsule Take 1 capsule by mouth daily.        . carvedilol (COREG) 3.125 MG tablet Take 3.125 mg by mouth daily.      . celecoxib (CELEBREX) 200 MG capsule Take 200 mg by mouth as needed.       . Cholecalciferol (VITAMIN D) 1000 UNITS capsule Take 1,000 Units by mouth daily.        . Flavocoxid (LIMBREL) 500 MG CAPS Take 500 mg by mouth daily.        . niacin 250 MG CR capsule Take 250 mg by mouth daily.        . Omega-3 Fatty Acids (FISH OIL PO) Take 1 tablet by mouth daily.        . ramipril (ALTACE) 10 MG capsule Take 1 capsule (10 mg total) by mouth daily.  90 capsule  1  . ranitidine (ZANTAC) 150 MG capsule Take 1 capsule (150 mg total) by mouth 2 (two) times daily.  180 capsule  1  . SUMAtriptan (IMITREX) 100 MG tablet        No current facility-administered medications for this visit.     Past Medical History  Diagnosis  Date  . Hypertension     Borderline  . Degeneration of lumbar intervertebral disc   . GERD (gastroesophageal reflux disease)   . Ischemic heart disease     s/p MI, CABG 2000, s/p cath 2002 with 4/5 grafts patent (LIMA to LAD atretic and occluded mid vessel but adequate flow to distal LAD from collaterals / diagonal  . Low back pain   . Peyronie's disease   . PAC (premature atrial contraction)   . PAF (paroxysmal atrial fibrillation)     initially diagnosed during stress test 2002  . Coronary artery disease     CABG x5 in 2000; s/p cath 2002 > 4/5 grafts patent, LVEF 50%  . CHF (congestive heart failure)     LVEF at time of cath 2002 50%; now has been 35-40% since 2008 (patient has seen multiple cardiologists over the years including [facility], [place], [place] [facility], [facility] and Tollie Eth, MD)    ROS:   All systems reviewed and negative except as noted in the HPI.   Past Surgical History  Procedure Laterality Date  . Cardiac catheterization  09/14/2001  Mildly decreased left ventricular systolic function --  Native three vessel coronary artery disease as described. -- Status post coronary artery bypass grafting  . Hernia repair  05/16/2001    Large left indirect inguinal hernia with right direct hernia.  Marland Kitchen Hernia repair      Recurrent left inguinal hernia -- Large left indirect inguinal hernia with right direct hernia.  . Coronary artery bypass graft  2000    LIMA to LAD, SVG to second diagonal, seq SVG to ramus and OM, SVG to PDA     Family History  Problem Relation Age of Onset  . Adopted: Yes     History   Social History  . Marital Status: Single    Spouse Name: N/A    Number of Children: N/A  . Years of Education: N/A   Occupational History  . Not on file.   Social History Main Topics  . Smoking status: Former Research scientist (life sciences)  . Smokeless tobacco: Not on file  . Alcohol Use: Not on file  . Drug Use: No  . Sexual Activity: Not on  file   Other Topics Concern  . Not on file   Social History Narrative  . No narrative on file     BP 138/80  Pulse 70  Ht 6\' 2"  (1.88 m)  Wt 207 lb (93.895 kg)  BMI 26.57 kg/m2  Physical Exam:  Well appearing 63 yo man, NAD HEENT: Unremarkable Neck:  No JVD, no thyromegally Back:  No CVA tenderness Lungs:  Clear with no wheezes, rales, or rhonchi HEART:  Regular rate rhythm, no murmurs, no rubs, no clicks Abd:  soft, positive bowel sounds, no organomegally, no rebound, no guarding Ext:  2 plus pulses, no edema, no cyanosis, no clubbing Skin:  No rashes no nodules Neuro:  CN II through XII intact, motor grossly intact  Holter monitor - PAF with a RVR  Assess/Plan:

## 2013-11-25 ENCOUNTER — Encounter (HOSPITAL_COMMUNITY): Payer: Self-pay | Admitting: Pharmacy Technician

## 2013-11-26 ENCOUNTER — Encounter (HOSPITAL_COMMUNITY)
Admission: RE | Disposition: A | Discharge status: Home / Self Care | Payer: Self-pay | Source: Ambulatory Visit | Attending: Cardiology

## 2013-11-26 ENCOUNTER — Ambulatory Visit (HOSPITAL_COMMUNITY)
Admission: RE | Admit: 2013-11-26 | Discharge: 2013-11-26 | Disposition: A | Discharge status: Home / Self Care | Payer: BC Managed Care – PPO | Source: Ambulatory Visit | Attending: Cardiology | Admitting: Cardiology

## 2013-11-26 DIAGNOSIS — R0989 Other specified symptoms and signs involving the circulatory and respiratory systems: Secondary | ICD-10-CM | POA: Insufficient documentation

## 2013-11-26 DIAGNOSIS — I2510 Atherosclerotic heart disease of native coronary artery without angina pectoris: Secondary | ICD-10-CM

## 2013-11-26 DIAGNOSIS — I519 Heart disease, unspecified: Secondary | ICD-10-CM | POA: Insufficient documentation

## 2013-11-26 DIAGNOSIS — R0609 Other forms of dyspnea: Secondary | ICD-10-CM | POA: Insufficient documentation

## 2013-11-26 DIAGNOSIS — Z951 Presence of aortocoronary bypass graft: Secondary | ICD-10-CM | POA: Insufficient documentation

## 2013-11-26 HISTORY — PX: LEFT HEART CATHETERIZATION WITH CORONARY/GRAFT ANGIOGRAM: SHX5450

## 2013-11-26 LAB — BASIC METABOLIC PANEL
BUN: 17 mg/dL (ref 6–23)
CO2: 22 meq/L (ref 19–32)
CREATININE: 0.95 mg/dL (ref 0.50–1.35)
Calcium: 9.3 mg/dL (ref 8.4–10.5)
Chloride: 105 mEq/L (ref 96–112)
GFR calc Af Amer: >90 mL/min (ref >90)
GFR calc non Af Amer: 86 mL/min — ABNORMAL LOW (ref >90)
Glucose, Bld: 87 mg/dL (ref 70–99)
POTASSIUM: 3.6 meq/L — AB (ref 3.7–5.3)
Sodium: 140 mEq/L (ref 137–147)

## 2013-11-26 LAB — PROTIME-INR
INR: 1 (ref 0.00–1.49)
PROTHROMBIN TIME: 13 s (ref 11.6–15.2)

## 2013-11-26 LAB — CBC
HCT: 44.9 % (ref 39.0–52.0)
HEMOGLOBIN: 15.8 g/dL (ref 13.0–17.0)
MCH: 32.3 pg (ref 26.0–34.0)
MCHC: 35.2 g/dL (ref 30.0–36.0)
MCV: 91.8 fL (ref 78.0–100.0)
Platelets: 168 10*3/uL (ref 150–400)
RBC: 4.89 MIL/uL (ref 4.22–5.81)
RDW: 13.3 % (ref 11.5–15.5)
WBC: 5.9 10*3/uL (ref 4.0–10.5)

## 2013-11-26 SURGERY — LEFT HEART CATHETERIZATION WITH CORONARY/GRAFT ANGIOGRAM

## 2013-11-26 MED ORDER — SODIUM CHLORIDE 0.9 % IJ SOLN: 3.0000 mL | Freq: Two times a day (BID) | INTRAMUSCULAR | Status: DC

## 2013-11-26 MED ORDER — SODIUM CHLORIDE 0.9 % IV SOLN: 1.0000 mL/kg/h | INTRAVENOUS | Status: DC

## 2013-11-26 MED ORDER — MIDAZOLAM HCL 2 MG/2ML IJ SOLN
INTRAMUSCULAR | Status: AC
  Filled 2013-11-26: qty 2

## 2013-11-26 MED ORDER — SODIUM CHLORIDE 0.9 % IJ SOLN: 3.0000 mL | INTRAMUSCULAR | Status: DC | PRN

## 2013-11-26 MED ORDER — SODIUM CHLORIDE 0.9 % IV SOLN
INTRAVENOUS | Status: DC
  Administered 2013-11-26: 07:00:00 via INTRAVENOUS

## 2013-11-26 MED ORDER — HEPARIN (PORCINE) IN NACL 2-0.9 UNIT/ML-% IJ SOLN
INTRAMUSCULAR | Status: AC
  Filled 2013-11-26: qty 1500

## 2013-11-26 MED ORDER — ONDANSETRON HCL 4 MG/2ML IJ SOLN: 4.0000 mg | Freq: Four times a day (QID) | INTRAMUSCULAR | Status: DC | PRN

## 2013-11-26 MED ORDER — ASPIRIN 81 MG PO CHEW
81.0000 mg | CHEWABLE_TABLET | ORAL | Status: AC
  Administered 2013-11-26: 81 mg via ORAL
  Filled 2013-11-26: qty 1

## 2013-11-26 MED ORDER — VERAPAMIL HCL 2.5 MG/ML IV SOLN
INTRAVENOUS | Status: AC
  Filled 2013-11-26: qty 2

## 2013-11-26 MED ORDER — FENTANYL CITRATE 0.05 MG/ML IJ SOLN
INTRAMUSCULAR | Status: AC
  Filled 2013-11-26: qty 2

## 2013-11-26 MED ORDER — LIDOCAINE HCL (PF) 1 % IJ SOLN
INTRAMUSCULAR | Status: AC
  Filled 2013-11-26: qty 30

## 2013-11-26 MED ORDER — NITROGLYCERIN 0.2 MG/ML ON CALL CATH LAB
INTRAVENOUS | Status: AC
  Filled 2013-11-26: qty 1

## 2013-11-26 MED ORDER — SODIUM CHLORIDE 0.9 % IV SOLN: 250.0000 mL | INTRAVENOUS | Status: DC | PRN

## 2013-11-26 NOTE — Discharge Instructions (Signed)

## 2013-11-26 NOTE — CV Procedure (Signed)
    Cardiac Catheterization Procedure Note  Name: Keith Vazquez MRN: [id number] DOB: 02/16/1951  Procedure: Left Heart Cath, Selective Coronary Angiography, SVG angiography, LIMA angiography, LV angiography  Indication: 63 yo WM with history of CAD s/p remote CABG presents with symptoms of dyspnea. Myoview demonstrates an EF of 29% with inferior and anterior wall scar.   Procedural Details: The left wrist was prepped, draped, and anesthetized with 1% lidocaine. Using the modified Seldinger technique, a 5 French sheath was introduced into the left radial artery. 3 mg of verapamil was administered through the sheath, weight-based unfractionated heparin was administered intravenously. Standard Judkins catheters were used for selective coronary angiography, graft and left ventriculography. Catheter exchanges were performed over an exchange length guidewire. There were no immediate procedural complications. A TR band was used for radial hemostasis at the completion of the procedure.  The patient was transferred to the post catheterization recovery area for further monitoring.  Procedural Findings: Hemodynamics: AO 118/63 mean 87 mm Hg LV 124/22 mm Hg  Coronary angiography: Coronary dominance: right  Left mainstem: Normal  Left anterior descending (LAD):  100% occlusion in the mid vessel after the first diagonal and septal perforator.   Ramus intermediate: 40% proximal disease.  Left circumflex (LCx):  100% occlusion in the mid vessel.   Right coronary artery (RCA):  100% occlusion proximally.  SVG to the PDA is widely patent and fills the entire RCA.  SVG to the second diagonal is widely patent and fills the mid to distal LAD.  SVG sequential to the ramus intermediate and OM is widely patent.  LIMA to the LAD is atretic.  Left ventriculography: Left ventricular size is enlarged, LVEF is estimated at 30%, There is severe global hypokinesis, there is no significant mitral  regurgitation   Final Conclusions:   1. Severe 3 vessel obstructive CAD 2. SVG to the diagonal is patent 3. SVG sequential to the ramus intermediate and OM is patent. 4. SVG to the PDA is patent.  5. LIMA to the LAD is atretic but the LAD fills well from the SVG to the diagonal. 6. Severe LV dysfunction.  Recommendations: Medical management to optimize CHF therapy.  Collier Salina [facility] 11/26/2013, 8:35 AM

## 2013-11-26 NOTE — Interval H&P Note (Signed)
History and Physical Interval Note:  11/26/2013 7:43 AM  [name]  has presented today for surgery, with the diagnosis of Chest pain  The various methods of treatment have been discussed with the patient and family. After consideration of risks, benefits and other options for treatment, the patient has consented to  Procedure(s): LEFT HEART CATHETERIZATION WITH CORONARY ANGIOGRAM (N/A) as a surgical intervention .  The patient's history has been reviewed, patient examined, no change in status, stable for surgery.  I have reviewed the patient's chart and labs.  Questions were answered to the patient's satisfaction.   Cath Lab Visit (complete for each Cath Lab visit)  Clinical Evaluation Leading to the Procedure:   ACS: no  Non-ACS:    Anginal Classification: CCS II  Anti-ischemic medical therapy: Minimal Therapy (1 class of medications)  Non-Invasive Test Results: High-risk stress test findings: cardiac mortality >3%/year  Prior CABG: Previous CABG        Collier Salina [facility] 11/26/2013 7:44 AM

## 2013-11-26 NOTE — H&P (View-Only) (Signed)
HPI [name] returns today for followup. He is a pleasant 63 yo man with a h/o CAD, s/p CABG, HTN, dyslipidemia and PAF. The patient has had some worsening but intermittant sob over the past few weeks. He had a stress test where he walked through stage 3 of a Bruce protocol, stopping because his heart rate was increased. The perfusion images demonstrated that he had a large defect in the inferior and lateral wall with lateral reversibility. He has worn a cardiac monitor which demonstrated PAF with an RVR. His EF is reduced. Despite this he continues to exercise. He has dyspnea with exertion which he downplays as mild. He denies syncope.  Allergies  Allergen Reactions  . Oxycodone-Acetaminophen Nausea Only  . Propoxyphene N-Acetaminophen      Current Outpatient Prescriptions  Medication Sig Dispense Refill  . Ascorbic Acid (VITAMIN C) 100 MG tablet Take 100 mg by mouth daily.        Marland Kitchen azelastine (ASTELIN) 137 MCG/SPRAY nasal spray Place 2 sprays into both nostrils 2 (two) times daily. Use in each nostril as directed  30 mL  12  . b complex vitamins capsule Take 1 capsule by mouth daily.        . carvedilol (COREG) 3.125 MG tablet Take 3.125 mg by mouth daily.      . celecoxib (CELEBREX) 200 MG capsule Take 200 mg by mouth as needed.       . Cholecalciferol (VITAMIN D) 1000 UNITS capsule Take 1,000 Units by mouth daily.        . Flavocoxid (LIMBREL) 500 MG CAPS Take 500 mg by mouth daily.        . niacin 250 MG CR capsule Take 250 mg by mouth daily.        . Omega-3 Fatty Acids (FISH OIL PO) Take 1 tablet by mouth daily.        . ramipril (ALTACE) 10 MG capsule Take 1 capsule (10 mg total) by mouth daily.  90 capsule  1  . ranitidine (ZANTAC) 150 MG capsule Take 1 capsule (150 mg total) by mouth 2 (two) times daily.  180 capsule  1  . SUMAtriptan (IMITREX) 100 MG tablet        No current facility-administered medications for this visit.     Past Medical History  Diagnosis  Date  . Hypertension     Borderline  . Degeneration of lumbar intervertebral disc   . GERD (gastroesophageal reflux disease)   . Ischemic heart disease     s/p MI, CABG 2000, s/p cath 2002 with 4/5 grafts patent (LIMA to LAD atretic and occluded mid vessel but adequate flow to distal LAD from collaterals / diagonal  . Low back pain   . Peyronie's disease   . PAC (premature atrial contraction)   . PAF (paroxysmal atrial fibrillation)     initially diagnosed during stress test 2002  . Coronary artery disease     CABG x5 in 2000; s/p cath 2002 > 4/5 grafts patent, LVEF 50%  . CHF (congestive heart failure)     LVEF at time of cath 2002 50%; now has been 35-40% since 2008 (patient has seen multiple cardiologists over the years including [facility], [place], [place] [facility], [facility] and Tollie Eth, MD)    ROS:   All systems reviewed and negative except as noted in the HPI.   Past Surgical History  Procedure Laterality Date  . Cardiac catheterization  09/14/2001  Mildly decreased left ventricular systolic function --  Native three vessel coronary artery disease as described. -- Status post coronary artery bypass grafting  . Hernia repair  05/16/2001    Large left indirect inguinal hernia with right direct hernia.  . Hernia repair      Recurrent left inguinal hernia -- Large left indirect inguinal hernia with right direct hernia.  . Coronary artery bypass graft  2000    LIMA to LAD, SVG to second diagonal, seq SVG to ramus and OM, SVG to PDA     Family History  Problem Relation Age of Onset  . Adopted: Yes     History   Social History  . Marital Status: Single    Spouse Name: N/A    Number of Children: N/A  . Years of Education: N/A   Occupational History  . Not on file.   Social History Main Topics  . Smoking status: Former Smoker  . Smokeless tobacco: Not on file  . Alcohol Use: Not on file  . Drug Use: No  . Sexual Activity: Not on  file   Other Topics Concern  . Not on file   Social History Narrative  . No narrative on file     BP 138/80  Pulse 70  Ht 6' 2" (1.88 m)  Wt 207 lb (93.895 kg)  BMI 26.57 kg/m2  Physical Exam:  Well appearing 62 yo man, NAD HEENT: Unremarkable Neck:  No JVD, no thyromegally Back:  No CVA tenderness Lungs:  Clear with no wheezes, rales, or rhonchi HEART:  Regular rate rhythm, no murmurs, no rubs, no clicks Abd:  soft, positive bowel sounds, no organomegally, no rebound, no guarding Ext:  2 plus pulses, no edema, no cyanosis, no clubbing Skin:  No rashes no nodules Neuro:  CN II through XII intact, motor grossly intact  Holter monitor - PAF with a RVR  Assess/Plan: 

## 2013-11-28 ENCOUNTER — Ambulatory Visit (INDEPENDENT_AMBULATORY_CARE_PROVIDER_SITE_OTHER): Payer: BC Managed Care – PPO | Admitting: Internal Medicine

## 2013-11-28 ENCOUNTER — Encounter: Payer: Self-pay | Admitting: Internal Medicine

## 2013-11-28 VITALS — BP 140/86 | HR 66 | Ht 74.0 in

## 2013-11-28 DIAGNOSIS — E785 Hyperlipidemia, unspecified: Secondary | ICD-10-CM

## 2013-11-28 DIAGNOSIS — I5022 Chronic systolic (congestive) heart failure: Secondary | ICD-10-CM

## 2013-11-28 DIAGNOSIS — I10 Essential (primary) hypertension: Secondary | ICD-10-CM

## 2013-11-28 DIAGNOSIS — I4891 Unspecified atrial fibrillation: Secondary | ICD-10-CM

## 2013-11-28 DIAGNOSIS — I2589 Other forms of chronic ischemic heart disease: Secondary | ICD-10-CM

## 2013-11-28 MED ORDER — AMIODARONE HCL 200 MG PO TABS: 200.0000 mg | ORAL_TABLET | Freq: Two times a day (BID) | ORAL | Status: DC

## 2013-11-28 MED ORDER — APIXABAN 5 MG PO TABS: 5.0000 mg | ORAL_TABLET | Freq: Two times a day (BID) | ORAL | Status: DC

## 2013-11-28 MED ORDER — RAMIPRIL 2.5 MG PO CAPS: 2.5000 mg | ORAL_CAPSULE | Freq: Every day | ORAL | Status: DC

## 2013-11-28 MED ORDER — ATORVASTATIN CALCIUM 10 MG PO TABS: 10.0000 mg | ORAL_TABLET | Freq: Every day | ORAL | Status: DC

## 2013-11-28 NOTE — Progress Notes (Signed)
HPI [name] returns today for followup. He is a 63 year old man with coronary artery disease, status post bypass surgery, with worsening left ventricular dysfunction, and paroxysmal atrial arrhythmias. The patient is an Chief Technology Officer, and has been fairly reluctant to take medications. He underwent exercise perfusion stress test in several weeks ago and was found to have a positive, high-risk scan. His ejection fraction was 30%. He underwent left heart catheterization which demonstrated total occlusion of all 3 native coronary arteries, and atretic LIMA to the LAD, and patent vein grafts to the right coronary artery, left circumflex coronary artery, and right coronary artery. His ejection fraction has been somewhat variable but at catheterization was also 30%. He has not had syncope. In addition, he has palpitations and intermittent shortness of breath, and a cardiac monitor demonstrated paroxysmal atrial fibrillation and flutter at ventricular rates of 170 beats per minute. In the past, he has been unwilling to take anticoagulation. In addition, he has been reluctant to take beta blockers. Today he returns for followup and has a host of questions. Allergies  Allergen Reactions  . Oxycodone-Acetaminophen Nausea Only  . Propoxyphene N-Acetaminophen      Current Outpatient Prescriptions  Medication Sig Dispense Refill  . Ascorbic Acid (VITAMIN C) 100 MG tablet Take 100 mg by mouth daily.        Marland Kitchen azelastine (ASTELIN) 137 MCG/SPRAY nasal spray Place 2 sprays into both nostrils 2 (two) times daily. Use in each nostril as directed  30 mL  12  . b complex vitamins capsule Take 1 capsule by mouth daily.        . carvedilol (COREG) 3.125 MG tablet Take 3.125 mg by mouth daily.      . celecoxib (CELEBREX) 200 MG capsule Take 200 mg by mouth daily as needed for moderate pain.       . Cholecalciferol (VITAMIN D) 1000 UNITS capsule Take 1,000 Units by mouth daily.        . Flavocoxid (LIMBREL)  500 MG CAPS Take 500 mg by mouth daily.        Marland Kitchen HYDROcodone-acetaminophen (NORCO/VICODIN) 5-325 MG per tablet Take 1 tablet by mouth as needed.      . niacin 250 MG CR capsule Take 250 mg by mouth daily.        . Omega-3 Fatty Acids (FISH OIL PO) Take 2,400 mg by mouth daily.       . ramipril (ALTACE) 2.5 MG capsule Take 1 capsule (2.5 mg total) by mouth daily.  90 capsule  1  . ranitidine (ZANTAC) 150 MG capsule Take 1 capsule (150 mg total) by mouth 2 (two) times daily.  180 capsule  1  . SUMAtriptan (IMITREX) 100 MG tablet Take 100 mg by mouth every 2 (two) hours as needed for migraine.       . topiramate (TOPAMAX) 25 MG tablet Take 1 tablet by mouth 2 (two) times daily.      Marland Kitchen amiodarone (PACERONE) 200 MG tablet Take 1 tablet (200 mg total) by mouth 2 (two) times daily.  60 tablet  11  . apixaban (ELIQUIS) 5 MG TABS tablet Take 1 tablet (5 mg total) by mouth 2 (two) times daily.  60 tablet  0  . atorvastatin (LIPITOR) 10 MG tablet Take 1 tablet (10 mg total) by mouth daily.  30 tablet  11   No current facility-administered medications for this visit.     Past Medical History  Diagnosis Date  . Hypertension  Borderline  . Degeneration of lumbar intervertebral disc   . GERD (gastroesophageal reflux disease)   . Ischemic heart disease     s/p MI, CABG 2000, s/p cath 2002 with 4/5 grafts patent (LIMA to LAD atretic and occluded mid vessel but adequate flow to distal LAD from collaterals / diagonal  . Low back pain   . Peyronie's disease   . PAC (premature atrial contraction)   . PAF (paroxysmal atrial fibrillation)     initially diagnosed during stress test 2002  . Coronary artery disease     CABG x5 in 2000; s/p cath 2002 > 4/5 grafts patent, LVEF 50%  . CHF (congestive heart failure)     LVEF at time of cath 2002 50%; now has been 35-40% since 2008 (patient has seen multiple cardiologists over the years including [facility], [place], [place] [facility], [facility]  Cardiology and Tollie Eth, MD)    ROS:   All systems reviewed and negative except as noted in the HPI.   Past Surgical History  Procedure Laterality Date  . Cardiac catheterization  09/14/2001     Mildly decreased left ventricular systolic function --  Native three vessel coronary artery disease as described. -- Status post coronary artery bypass grafting  . Hernia repair  05/16/2001    Large left indirect inguinal hernia with right direct hernia.  Marland Kitchen Hernia repair      Recurrent left inguinal hernia -- Large left indirect inguinal hernia with right direct hernia.  . Coronary artery bypass graft  2000    LIMA to LAD, SVG to second diagonal, seq SVG to ramus and OM, SVG to PDA     Family History  Problem Relation Age of Onset  . Adopted: Yes     History   Social History  . Marital Status: Single    Spouse Name: N/A    Number of Children: N/A  . Years of Education: N/A   Occupational History  . Not on file.   Social History Main Topics  . Smoking status: Former Research scientist (life sciences)  . Smokeless tobacco: Not on file  . Alcohol Use: Not on file  . Drug Use: No  . Sexual Activity: Not on file   Other Topics Concern  . Not on file   Social History Narrative  . No narrative on file     BP 140/86  Pulse 66  Ht 6\' 2"  (1.88 m)  Physical Exam:  Well appearing 63 year old man,NAD HEENT: Unremarkable Neck:  No JVD, no thyromegally Back:  No CVA tenderness Lungs:  Clear with no wheezes, rales, or rhonchi. HEART:  Regular rate rhythm, no murmurs, no rubs, no clicks Abd:  soft, positive bowel sounds, no organomegally, no rebound, no guarding Ext:  2 plus pulses, no edema, no cyanosis, no clubbing Skin:  No rashes no nodules Neuro:  CN II through XII intact, motor grossly intact  Holter monitor - normal sinus rhythm with paroxysms of atrial fibrillation and flutter with a rapid ventricular response.   Assess/Plan:

## 2013-11-28 NOTE — Assessment & Plan Note (Signed)
The patient has atrial fibrillation. While he is not having much in the way of palpitations, he is having episodic shortness of breath which correlates with a rapid heart rate when he monitors his blood pressure. I've recommended that he start systemic anticoagulation, and we will initiate amiodarone 200 mg twice a day. Hopefully this will control his symptoms.

## 2013-11-28 NOTE — Patient Instructions (Signed)
Your physician recommends that you schedule a follow-up appointment in: 6 weeks with Dr Lovena Le  Your physician has recommended you make the following change in your medication:  1) Start Eliquis 5mg  twice daily 2) Start Amiodarone 200mg  twice daily 3) Decrease Ramipril to 2.5mg  daily 4) Start Atorvastatin 10mg  daily  Your physician recommends that you return for lab work in 6 weeks with follow up appoitment Will need BMP/TSH/LIVER/CPK

## 2013-11-28 NOTE — Assessment & Plan Note (Signed)
His symptoms are class IIA when he is in sinus rhythm and class IIIB when he is in atrial fibrillation. Hopefully he will improve with medical therapy. We discussed ICD implantation for prevention of malignant ventricular arrhythmias. There is a small chance that his ejection fraction will improve with medical therapy. I plan to have him repeat his 2-D echo in approximately 2 months. If his EF remains less than 35%, ICD implantation would be strongly considered.

## 2013-11-28 NOTE — Assessment & Plan Note (Signed)
The patient has worsening left ventricular dysfunction but catheterization demonstrates patent grafts. We will adjust his medications, but up titrating his beta blocker, and down titrating his ACE inhibitor. Hopefully his blood pressure will tolerate. He does not have anginal symptoms. He remains fairly active, and he is encouraged to remain so.

## 2013-11-28 NOTE — Assessment & Plan Note (Signed)
The patient has been reluctant to take statin therapy in the past. I've strongly encouraged him to reconsider and start taking atorvastatin. A prescription has been supplied.

## 2013-11-29 ENCOUNTER — Telehealth: Payer: Self-pay | Admitting: Internal Medicine

## 2013-11-29 NOTE — Telephone Encounter (Addendum)
Spoke with patient and he " has some questions"  I have answered all of his questions and he was appreciative of my call

## 2013-11-29 NOTE — Telephone Encounter (Signed)
New problem   Pt need to speak to nurse concerning his history records. Please call pt.

## 2013-12-09 ENCOUNTER — Telehealth: Payer: Self-pay | Admitting: Internal Medicine

## 2013-12-09 NOTE — Telephone Encounter (Signed)
Thinks Carvedilol is helping and wants to know if the higher dose discussed would be appropriate. Will forward to Arcola Jansky RN and Dr Lovena Le for review

## 2013-12-09 NOTE — Telephone Encounter (Signed)
New Prob   Pt states he is able to tolerate Carvedilol without difficulty. He is inquiring about Dr. Lovena Le now doubling his dosage. Please call.

## 2013-12-10 MED ORDER — CARVEDILOL 6.25 MG PO TABS: 6.2500 mg | ORAL_TABLET | Freq: Two times a day (BID) | ORAL | Status: DC

## 2013-12-10 NOTE — Telephone Encounter (Signed)
Discussed with Dr Lovena Le will increase to Carvedilol to 6.25mg  twice daily

## 2013-12-17 ENCOUNTER — Ambulatory Visit: Payer: BC Managed Care – PPO | Admitting: Internal Medicine

## 2013-12-27 ENCOUNTER — Other Ambulatory Visit (INDEPENDENT_AMBULATORY_CARE_PROVIDER_SITE_OTHER): Payer: BC Managed Care – PPO

## 2013-12-27 ENCOUNTER — Encounter: Payer: BC Managed Care – PPO | Admitting: Cardiovascular Disease

## 2013-12-27 DIAGNOSIS — I10 Essential (primary) hypertension: Secondary | ICD-10-CM

## 2013-12-27 LAB — HEPATIC FUNCTION PANEL
ALBUMIN: 4.4 g/dL (ref 3.5–5.2)
ALK PHOS: 77 U/L (ref 39–117)
ALT: 11 U/L (ref 0–53)
AST: 24 U/L (ref 0–37)
Bilirubin, Direct: 0.1 mg/dL (ref 0.0–0.3)
TOTAL PROTEIN: 7.7 g/dL (ref 6.0–8.3)
Total Bilirubin: 1 mg/dL (ref 0.3–1.2)

## 2013-12-27 LAB — BASIC METABOLIC PANEL
BUN: 23 mg/dL (ref 6–23)
CHLORIDE: 107 meq/L (ref 96–112)
CO2: 22 meq/L (ref 19–32)
CREATININE: 1.3 mg/dL (ref 0.4–1.5)
Calcium: 9.2 mg/dL (ref 8.4–10.5)
GFR: 59.95 mL/min — ABNORMAL LOW (ref >60.00)
Glucose, Bld: 95 mg/dL (ref 70–99)
Potassium: 4 mEq/L (ref 3.5–5.1)
SODIUM: 136 meq/L (ref 135–145)

## 2013-12-27 LAB — CK TOTAL AND CKMB (NOT AT ARMC)
CK TOTAL: 182 U/L (ref 7–232)
CK, MB: 2.7 ng/mL (ref 0.0–5.0)
Relative Index: 1.5 (ref 0.0–4.0)

## 2013-12-27 LAB — TSH: TSH: 1.81 u[IU]/mL (ref 0.35–5.50)

## 2013-12-27 NOTE — Progress Notes (Signed)
appt cancelled

## 2014-01-13 ENCOUNTER — Other Ambulatory Visit: Payer: BC Managed Care – PPO

## 2014-01-15 ENCOUNTER — Ambulatory Visit (INDEPENDENT_AMBULATORY_CARE_PROVIDER_SITE_OTHER): Payer: Medicare Other | Admitting: Internal Medicine

## 2014-01-15 ENCOUNTER — Encounter: Payer: Self-pay | Admitting: Internal Medicine

## 2014-01-15 VITALS — BP 112/74 | HR 57 | Ht 74.0 in | Wt 208.4 lb

## 2014-01-15 DIAGNOSIS — I2510 Atherosclerotic heart disease of native coronary artery without angina pectoris: Secondary | ICD-10-CM

## 2014-01-15 DIAGNOSIS — I519 Heart disease, unspecified: Secondary | ICD-10-CM | POA: Diagnosis not present

## 2014-01-15 DIAGNOSIS — I5022 Chronic systolic (congestive) heart failure: Secondary | ICD-10-CM

## 2014-01-15 DIAGNOSIS — I4891 Unspecified atrial fibrillation: Secondary | ICD-10-CM

## 2014-01-15 DIAGNOSIS — I10 Essential (primary) hypertension: Secondary | ICD-10-CM

## 2014-01-15 DIAGNOSIS — I2589 Other forms of chronic ischemic heart disease: Secondary | ICD-10-CM

## 2014-01-15 NOTE — Assessment & Plan Note (Signed)
He denies anginal symptoms. Will continue his current meds.

## 2014-01-15 NOTE — Assessment & Plan Note (Signed)
His symptoms are currently class 2A. I have asked the patient to consider ICD implant. We will plan to repeat his 2D echo and if his EF remains depressed will plan ICD implant. No change in meds.

## 2014-01-15 NOTE — Patient Instructions (Signed)
Your physician wants you to follow-up in: 6 months with Dr Taylor You will receive a reminder letter in the mail two months in advance. If you don't receive a letter, please call our office to schedule the follow-up appointment.  Your physician has requested that you have an echocardiogram. Echocardiography is a painless test that uses sound waves to create images of your heart. It provides your doctor with information about the size and shape of your heart and how well your heart's chambers and valves are working. This procedure takes approximately one hour. There are no restrictions for this procedure.    

## 2014-01-15 NOTE — Progress Notes (Signed)
HPI Keith Vazquez returns today for followup. He is a 63 year old man with coronary artery disease, status post bypass surgery, with worsening left ventricular dysfunction, and paroxysmal atrial arrhythmias. The patient is an Chief Technology Officer, and has been fairly reluctant to take medications. He underwent exercise perfusion stress test in several weeks ago and was found to have a positive, high-risk scan. His ejection fraction was 30%. He underwent left heart catheterization which demonstrated total occlusion of all 3 native coronary arteries, and atretic LIMA to the LAD, and patent vein grafts to the right coronary artery, left circumflex coronary artery, and right coronary artery. His ejection fraction has been somewhat variable but at catheterization was also 30%. He has not had syncope. In addition, he has palpitations and intermittent shortness of breath, and a cardiac monitor demonstrated paroxysmal atrial fibrillation and flutter at ventricular rates of 170 beats per minute. In the past, he has been unwilling to take anticoagulation. In addition, he has been reluctant to take beta blockers. When I saw him last, he was willing to take a beta blocker and amiodarone but has still not been willing to take any anti-coagulation. He has some dyspnea and still feels palpitations. Allergies  Allergen Reactions  . Oxycodone-Acetaminophen Nausea Only  . Propoxyphene N-Acetaminophen      Current Outpatient Prescriptions  Medication Sig Dispense Refill  . amiodarone (PACERONE) 200 MG tablet Take 1 tablet (200 mg total) by mouth 2 (two) times daily.  60 tablet  11  . Ascorbic Acid (VITAMIN C) 100 MG tablet Take 100 mg by mouth daily.        Marland Kitchen atorvastatin (LIPITOR) 10 MG tablet Take 1 tablet (10 mg total) by mouth daily.  30 tablet  11  . azelastine (ASTELIN) 137 MCG/SPRAY nasal spray Place 2 sprays into both nostrils 2 (two) times daily.      Marland Kitchen b complex vitamins capsule Take 1 capsule by mouth  daily.        . carvedilol (COREG) 6.25 MG tablet Take 1 tablet (6.25 mg total) by mouth 2 (two) times daily with a meal.  180 tablet  3  . celecoxib (CELEBREX) 200 MG capsule Take 200 mg by mouth daily as needed for moderate pain.       . Cholecalciferol (VITAMIN D) 1000 UNITS capsule Take 1,000 Units by mouth daily.        . Flavocoxid (LIMBREL) 500 MG CAPS Take 500 mg by mouth daily.        Marland Kitchen HYDROcodone-acetaminophen (NORCO/VICODIN) 5-325 MG per tablet Take 1 tablet by mouth as needed.      . niacin 250 MG CR capsule Take 250 mg by mouth daily.        . Omega-3 Fatty Acids (FISH OIL PO) Take 2,400 mg by mouth daily.       . ramipril (ALTACE) 2.5 MG capsule Take 1 capsule (2.5 mg total) by mouth daily.  90 capsule  1  . ranitidine (ZANTAC) 150 MG capsule Take 1 capsule (150 mg total) by mouth 2 (two) times daily.  180 capsule  1  . topiramate (TOPAMAX) 25 MG tablet Take 1 tablet by mouth 2 (two) times daily.       No current facility-administered medications for this visit.     Past Medical History  Diagnosis Date  . Hypertension     Borderline  . Degeneration of lumbar intervertebral disc   . GERD (gastroesophageal reflux disease)   . Ischemic heart disease  s/p MI, CABG 2000, s/p cath 2002 with 4/5 grafts patent (LIMA to LAD atretic and occluded mid vessel but adequate flow to distal LAD from collaterals / diagonal  . Low back pain   . Peyronie's disease   . PAC (premature atrial contraction)   . PAF (paroxysmal atrial fibrillation)     initially diagnosed during stress test 2002  . Coronary artery disease     CABG x5 in 2000; s/p cath 2002 > 4/5 grafts patent, LVEF 50%  . CHF (congestive heart failure)     LVEF at time of cath 2002 50%; now has been 35-40% since 2008 (patient has seen multiple cardiologists over the years including [facility], [place], [place] [facility], [facility] and Tollie Eth, MD)    ROS:   All systems reviewed and negative  except as noted in the HPI.   Past Surgical History  Procedure Laterality Date  . Cardiac catheterization  09/14/2001     Mildly decreased left ventricular systolic function --  Native three vessel coronary artery disease as described. -- Status post coronary artery bypass grafting  . Hernia repair  05/16/2001    Large left indirect inguinal hernia with right direct hernia.  Marland Kitchen Hernia repair      Recurrent left inguinal hernia -- Large left indirect inguinal hernia with right direct hernia.  . Coronary artery bypass graft  2000    LIMA to LAD, SVG to second diagonal, seq SVG to ramus and OM, SVG to PDA     Family History  Problem Relation Age of Onset  . Adopted: Yes     History   Social History  . Marital Status: Single    Spouse Name: N/A    Number of Children: N/A  . Years of Education: N/A   Occupational History  . Not on file.   Social History Main Topics  . Smoking status: Former Research scientist (life sciences)  . Smokeless tobacco: Not on file  . Alcohol Use: Not on file  . Drug Use: No  . Sexual Activity: Not on file   Other Topics Concern  . Not on file   Social History Narrative  . No narrative on file     BP 112/74  Pulse 57  Ht 6\' 2"  (1.88 m)  Wt 208 lb 6.4 oz (94.53 kg)  BMI 26.75 kg/m2  Physical Exam:  Well appearing 63 year old man,NAD HEENT: Unremarkable Neck:  No JVD, no thyromegally Back:  No CVA tenderness Lungs:  Clear with no wheezes, rales, or rhonchi. HEART:  Regular rate rhythm, no murmurs, no rubs, no clicks Abd:  soft, positive bowel sounds, no organomegally, no rebound, no guarding Ext:  2 plus pulses, no edema, no cyanosis, no clubbing Skin:  No rashes no nodules Neuro:  CN II through XII intact, motor grossly intact  ECG - nsr   Assess/Plan:

## 2014-01-15 NOTE — Assessment & Plan Note (Signed)
His symptoms are improved on amio. He will continue 400 mg daily. I plan to reduce his dose in a month or two.

## 2014-01-31 ENCOUNTER — Ambulatory Visit (HOSPITAL_COMMUNITY): Payer: Medicare Other | Attending: Internal Medicine | Admitting: Radiology

## 2014-01-31 DIAGNOSIS — I2589 Other forms of chronic ischemic heart disease: Secondary | ICD-10-CM

## 2014-01-31 DIAGNOSIS — I2510 Atherosclerotic heart disease of native coronary artery without angina pectoris: Secondary | ICD-10-CM | POA: Diagnosis not present

## 2014-01-31 DIAGNOSIS — I519 Heart disease, unspecified: Secondary | ICD-10-CM | POA: Diagnosis not present

## 2014-01-31 DIAGNOSIS — I4891 Unspecified atrial fibrillation: Secondary | ICD-10-CM

## 2014-01-31 DIAGNOSIS — I259 Chronic ischemic heart disease, unspecified: Secondary | ICD-10-CM | POA: Diagnosis not present

## 2014-01-31 NOTE — Progress Notes (Signed)
Echocardiogram Performed. 

## 2014-02-19 ENCOUNTER — Telehealth: Payer: Self-pay | Admitting: Internal Medicine

## 2014-02-19 NOTE — Telephone Encounter (Signed)
Appointment 02/25/14

## 2014-02-19 NOTE — Telephone Encounter (Signed)
Will try and move his appointment up  Melissa is calling patient with the time

## 2014-02-19 NOTE — Telephone Encounter (Signed)
New message   Patient call wanted to be seen by Dr. Lovena Le office appt on 5/14 .   pateint  stating woke up this am with afib x 4 hours .  Patient stated  This situation  need to be fix. Would like to see Dr. Lovena Le or Kerry Fort. Sooner than later.

## 2014-02-25 ENCOUNTER — Ambulatory Visit (INDEPENDENT_AMBULATORY_CARE_PROVIDER_SITE_OTHER): Payer: Medicare Other | Admitting: Internal Medicine

## 2014-02-25 ENCOUNTER — Encounter: Payer: Self-pay | Admitting: Internal Medicine

## 2014-02-25 VITALS — BP 162/86 | HR 53 | Ht 74.0 in | Wt 216.0 lb

## 2014-02-25 DIAGNOSIS — I5022 Chronic systolic (congestive) heart failure: Secondary | ICD-10-CM

## 2014-02-25 DIAGNOSIS — I4891 Unspecified atrial fibrillation: Secondary | ICD-10-CM | POA: Diagnosis not present

## 2014-02-25 DIAGNOSIS — I2589 Other forms of chronic ischemic heart disease: Secondary | ICD-10-CM

## 2014-02-25 NOTE — Patient Instructions (Signed)
You have been referred to Dr Thompson Grayer to discuss afib ablation  Your physician has recommended that you wear a holter monitor. Holter monitors are medical devices that record the heart's electrical activity. Doctors most often use these monitors to diagnose arrhythmias. Arrhythmias are problems with the speed or rhythm of the heartbeat. The monitor is a small, portable device. You can wear one while you do your normal daily activities. This is usually used to diagnose what is causing palpitations/syncope (passing out).

## 2014-02-25 NOTE — Assessment & Plan Note (Signed)
He appears to be improved. I will continue on his current dose of amio for now, with plans to reduce to 300 mg daily based on the results of his 48 hour holter monitor. The patient would like to consider catheter ablation of his atrial fibrillation and I will refer him to Dr. [name].

## 2014-02-25 NOTE — Progress Notes (Signed)
HPI Keith Vazquez returns today for followup. He is a pleasant 63 yo man with an ICM, chronic systolic CHF, paroxysmal atrial fibrillation who was placed on amiodarone several months ago. He does not always feel his atrial fib, but he does note episodes where he will suddenly get sob, lasting minutes to hours. He wonders if this is atrial fibrillation. He has these episodes almost every day. In contrast, he notes that his ability to exercise is much improved with initiation of medical therapy. He denies peripheral edema. Allergies  Allergen Reactions  . Oxycodone-Acetaminophen Nausea Only  . Propoxyphene N-Acetaminophen      Current Outpatient Prescriptions  Medication Sig Dispense Refill  . amiodarone (PACERONE) 200 MG tablet Take 1 tablet (200 mg total) by mouth 2 (two) times daily.  60 tablet  11  . Ascorbic Acid (VITAMIN C) 100 MG tablet Take 100 mg by mouth daily.        Marland Kitchen atorvastatin (LIPITOR) 10 MG tablet Take 1 tablet (10 mg total) by mouth daily.  30 tablet  11  . azelastine (ASTELIN) 137 MCG/SPRAY nasal spray Place 2 sprays into both nostrils 2 (two) times daily.      Marland Kitchen b complex vitamins capsule Take 1 capsule by mouth daily.        . carvedilol (COREG) 6.25 MG tablet Take 1 tablet (6.25 mg total) by mouth 2 (two) times daily with a meal.  180 tablet  3  . celecoxib (CELEBREX) 200 MG capsule Take 200 mg by mouth daily as needed for moderate pain.       . Cholecalciferol (VITAMIN D) 1000 UNITS capsule Take 1,000 Units by mouth daily.        . Flavocoxid (LIMBREL) 500 MG CAPS Take 500 mg by mouth daily.        . niacin 250 MG CR capsule Take 250 mg by mouth daily.        . Omega-3 Fatty Acids (FISH OIL PO) Take 2,400 mg by mouth daily.       . ramipril (ALTACE) 2.5 MG capsule Take 1 capsule (2.5 mg total) by mouth daily.  90 capsule  1  . ranitidine (ZANTAC) 150 MG capsule Take 1 capsule (150 mg total) by mouth 2 (two) times daily.  180 capsule  1   No current  facility-administered medications for this visit.     Past Medical History  Diagnosis Date  . Hypertension     Borderline  . Degeneration of lumbar intervertebral disc   . GERD (gastroesophageal reflux disease)   . Ischemic heart disease     s/p MI, CABG 2000, s/p cath 2002 with 4/5 grafts patent (LIMA to LAD atretic and occluded mid vessel but adequate flow to distal LAD from collaterals / diagonal  . Low back pain   . Peyronie's disease   . PAC (premature atrial contraction)   . PAF (paroxysmal atrial fibrillation)     initially diagnosed during stress test 2002  . Coronary artery disease     CABG x5 in 2000; s/p cath 2002 > 4/5 grafts patent, LVEF 50%  . CHF (congestive heart failure)     LVEF at time of cath 2002 50%; now has been 35-40% since 2008 (patient has seen multiple cardiologists over the years including [facility], [place], [place] [facility], [facility] and Tollie Eth, MD)    ROS:   All systems reviewed and negative except as noted in the HPI.   Past Surgical History  Procedure Laterality Date  . Cardiac catheterization  09/14/2001     Mildly decreased left ventricular systolic function --  Native three vessel coronary artery disease as described. -- Status post coronary artery bypass grafting  . Hernia repair  05/16/2001    Large left indirect inguinal hernia with right direct hernia.  Marland Kitchen Hernia repair      Recurrent left inguinal hernia -- Large left indirect inguinal hernia with right direct hernia.  . Coronary artery bypass graft  2000    LIMA to LAD, SVG to second diagonal, seq SVG to ramus and OM, SVG to PDA     Family History  Problem Relation Age of Onset  . Adopted: Yes     History   Social History  . Marital Status: Single    Spouse Name: N/A    Number of Children: N/A  . Years of Education: N/A   Occupational History  . Not on file.   Social History Main Topics  . Smoking status: Former Research scientist (life sciences)  . Smokeless  tobacco: Not on file  . Alcohol Use: Not on file  . Drug Use: No  . Sexual Activity: Not on file   Other Topics Concern  . Not on file   Social History Narrative  . No narrative on file     BP 162/86  Pulse 53  Ht 6\' 2"  (1.88 m)  Wt 216 lb (97.977 kg)  BMI 27.72 kg/m2  Physical Exam:  Well appearing 63 yo man, NAD HEENT: Unremarkable Neck:  No JVD, no thyromegally Back:  No CVA tenderness Lungs:  Clear with no wheezes HEART:  Regular rate rhythm, no murmurs, no rubs, no clicks Abd:  soft, positive bowel sounds, no organomegally, no rebound, no guarding Ext:  2 plus pulses, no edema, no cyanosis, no clubbing Skin:  No rashes no nodules Neuro:  CN II through XII intact, motor grossly intact  EKG - sinus bradycardia   Assess/Plan:

## 2014-02-25 NOTE — Assessment & Plan Note (Signed)
Overall he is improved but still has episodic dyspnea. It is unclear if this is caused by an arrhythmia (atrial fib/flutter or PVC's) or dietary indiscretion. He will likely need lasix but is against any new meds at this time.

## 2014-02-26 ENCOUNTER — Encounter (INDEPENDENT_AMBULATORY_CARE_PROVIDER_SITE_OTHER): Payer: Medicare Other

## 2014-02-26 ENCOUNTER — Encounter: Payer: Self-pay | Admitting: *Deleted

## 2014-02-26 DIAGNOSIS — I4891 Unspecified atrial fibrillation: Secondary | ICD-10-CM

## 2014-02-26 NOTE — Progress Notes (Signed)
Patient ID: Keith Vazquez, male   DOB: 07-29-1951, 63 y.o.   MRN: [id number] E- Cardio 48 hour holter monitor applied to patient.

## 2014-03-18 ENCOUNTER — Other Ambulatory Visit: Payer: Self-pay | Admitting: Internal Medicine

## 2014-03-18 ENCOUNTER — Other Ambulatory Visit: Payer: Self-pay

## 2014-03-18 DIAGNOSIS — M47817 Spondylosis without myelopathy or radiculopathy, lumbosacral region: Secondary | ICD-10-CM | POA: Diagnosis not present

## 2014-03-18 DIAGNOSIS — R51 Headache: Secondary | ICD-10-CM | POA: Diagnosis not present

## 2014-03-18 DIAGNOSIS — M1990 Unspecified osteoarthritis, unspecified site: Secondary | ICD-10-CM | POA: Diagnosis not present

## 2014-03-20 ENCOUNTER — Ambulatory Visit (INDEPENDENT_AMBULATORY_CARE_PROVIDER_SITE_OTHER): Payer: Medicare Other | Admitting: Internal Medicine

## 2014-03-20 ENCOUNTER — Encounter: Payer: Self-pay | Admitting: Internal Medicine

## 2014-03-20 ENCOUNTER — Telehealth: Payer: Self-pay | Admitting: Internal Medicine

## 2014-03-20 ENCOUNTER — Other Ambulatory Visit: Payer: Self-pay | Admitting: Internal Medicine

## 2014-03-20 VITALS — BP 136/90 | HR 59 | Ht 74.0 in | Wt 209.0 lb

## 2014-03-20 DIAGNOSIS — I2510 Atherosclerotic heart disease of native coronary artery without angina pectoris: Secondary | ICD-10-CM

## 2014-03-20 DIAGNOSIS — I5022 Chronic systolic (congestive) heart failure: Secondary | ICD-10-CM | POA: Diagnosis not present

## 2014-03-20 DIAGNOSIS — I4892 Unspecified atrial flutter: Secondary | ICD-10-CM | POA: Insufficient documentation

## 2014-03-20 DIAGNOSIS — I2589 Other forms of chronic ischemic heart disease: Secondary | ICD-10-CM

## 2014-03-20 DIAGNOSIS — I4891 Unspecified atrial fibrillation: Secondary | ICD-10-CM | POA: Diagnosis not present

## 2014-03-20 MED ORDER — SUMATRIPTAN SUCCINATE 25 MG PO TABS: 25.0000 mg | ORAL_TABLET | ORAL | Status: DC | PRN

## 2014-03-20 MED ORDER — APIXABAN 5 MG PO TABS: 5.0000 mg | ORAL_TABLET | Freq: Two times a day (BID) | ORAL | Status: DC

## 2014-03-20 NOTE — Patient Instructions (Addendum)
Your physician has recommended that you have an ablation. Catheter ablation is a medical procedure used to treat some cardiac arrhythmias (irregular heartbeats). During catheter ablation, a long, thin, flexible tube is put into a blood vessel in your groin (upper thigh), or neck. This tube is called an ablation catheter. It is then guided to your heart through the blood vessel. Radio frequency waves destroy small areas of heart tissue where abnormal heartbeats may cause an arrhythmia to start. Please see the instruction sheet given to you today.   Will call you once all scheduled   Go to [facility] lab on 6/30 and do not miss any doses of Eliquis

## 2014-03-20 NOTE — Telephone Encounter (Signed)
done

## 2014-03-20 NOTE — Telephone Encounter (Signed)
Pt is aware.  

## 2014-03-20 NOTE — Progress Notes (Signed)
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 male with a h/o paroxysmal atrial fibrillation and atrial flutter who presents today for further evaluation of his atrial arrhythmias.  He reports that he has had atrial fibrillation for several years.  He reports SOB and fatigue with his afib.  He is also felt to have worsening CHF with afib.  He finds that he is frequently breathless at night.  He has been placed on amiodarone but continues to have both afib and atrial flutter.  Today, he denies symptoms of  chest pain,  lower extremity edema, dizziness, presyncope, syncope, or neurologic sequela. The patient is tolerating medications without difficulties and is otherwise without complaint today.   Past Medical History  Diagnosis Date  . Hypertension     Borderline  . Degeneration of lumbar intervertebral disc   . GERD (gastroesophageal reflux disease)   . Ischemic heart disease     s/p MI, CABG 2000, s/p cath 2002 with 4/5 grafts patent (LIMA to LAD atretic and occluded mid vessel but adequate flow to distal LAD from collaterals / diagonal  . Low back pain   . Peyronie's disease   . PAC (premature atrial contraction)   . PAF (paroxysmal atrial fibrillation)     initially diagnosed during stress test 2002  . Coronary artery disease     CABG x5 in 2000; s/p cath 2002 > 4/5 grafts patent, LVEF 50%  . CHF (congestive heart failure)     LVEF at time of cath 2002 50%; now has been 35-40% since 2008 (patient has seen multiple cardiologists over the years including [facility], [place], [place] [facility], [facility] and Tollie Eth, MD)  . Atrial flutter   . GI bleed     duodenal ulcer   Past Surgical History  Procedure Laterality Date  . Cardiac catheterization  09/14/2001     Mildly decreased left ventricular systolic function --  Native three vessel coronary artery disease as described. -- Status post coronary  artery bypass grafting  . Hernia repair  05/16/2001    Large left indirect inguinal hernia with right direct hernia.  Marland Kitchen Hernia repair      Recurrent left inguinal hernia -- Large left indirect inguinal hernia with right direct hernia.  . Coronary artery bypass graft  2000    LIMA to LAD, SVG to second diagonal, seq SVG to ramus and OM, SVG to PDA    Current Outpatient Prescriptions  Medication Sig Dispense Refill  . amiodarone (PACERONE) 200 MG tablet Take 1 tablet (200 mg total) by mouth 2 (two) times daily.  60 tablet  11  . Ascorbic Acid (VITAMIN C) 100 MG tablet Take 100 mg by mouth daily.        Marland Kitchen atorvastatin (LIPITOR) 10 MG tablet Take 1 tablet (10 mg total) by mouth daily.  30 tablet  11  . b complex vitamins capsule Take 1 capsule by mouth daily.        . carvedilol (COREG) 6.25 MG tablet Take 1 tablet (6.25 mg total) by mouth 2 (two) times daily with a meal.  180 tablet  3  . celecoxib (CELEBREX) 200 MG capsule Take 200 mg by mouth daily as needed for moderate pain.       . Cholecalciferol (VITAMIN D) 1000 UNITS capsule Take 1,000 Units by mouth daily.        . niacin 250 MG CR capsule Take 250 mg  by mouth daily.        . Omega-3 Fatty Acids (FISH OIL PO) Take 2,400 mg by mouth daily.       . ramipril (ALTACE) 2.5 MG capsule Take 1 capsule (2.5 mg total) by mouth daily.  90 capsule  1  . ranitidine (ZANTAC) 150 MG capsule Take 1 capsule (150 mg total) by mouth 2 (two) times daily.  180 capsule  1  . Flavocoxid (LIMBREL) 500 MG CAPS Take 500 mg by mouth daily.         No current facility-administered medications for this visit.    Allergies  Allergen Reactions  . Oxycodone-Acetaminophen Nausea Only  . Propoxyphene N-Acetaminophen     History   Social History  . Marital Status: Single    Spouse Name: N/A    Number of Children: N/A  . Years of Education: N/A   Occupational History  . Not on file.   Social History Main Topics  . Smoking status: Former Research scientist (life sciences)  .  Smokeless tobacco: Not on file  . Alcohol Use: No  . Drug Use: No  . Sexual Activity: Not on file   Other Topics Concern  . Not on file   Social History Narrative   Lives in [place].  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 Adopted: Yes    ROS- All systems are reviewed and negative except as per the HPI above  Physical Exam: Filed Vitals:   03/20/14 0904  BP: 136/90  Pulse: 59  Height: 6\' 2"  (1.88 m)  Weight: 209 lb (94.802 kg)    GEN- The patient is well appearing, alert and oriented x 3 today.   Head- normocephalic, atraumatic Eyes-  Sclera clear, conjunctiva pink Ears- hearing intact Oropharynx- clear Neck- supple, no JVP Lymph- no cervical lymphadenopathy Lungs- Clear to ausculation bilaterally, normal work of breathing Heart- Regular rate and rhythm, no murmurs, rubs or gallops, PMI not laterally displaced GI- soft, NT, ND, + BS Extremities- no clubbing, cyanosis, or edema MS- no significant deformity or atrophy Skin- no rash or lesion Psych- euthymic mood, full affect Neuro- strength and sensation are intact  EKG today reveals sinus rhythm 59 bpm, PR 220, QTc 445, inferior infarction Epic records including echo and Dr Forde Dandy notes are reviewed Recent holter monitor is reviewed and reveals both afib and atrial flutter  Assessment and Plan:  1. afib and atrial flutter The patient has symptomatic paroxysmal atrial arrhythmias.  He has failed medical therapy with coreg and amiodarone.  His CHADS2VASC Score is at least 3.  He declines anticoagulation. Therapeutic strategies for afib and atrial flutter including medicine and ablation were discussed in detail with the patient today. Risk, benefits, and alternatives to EP study and radiofrequency ablation  were also discussed in detail today. These risks include but are not limited to stroke, bleeding, vascular damage, tamponade,  perforation, damage to the esophagus, lungs, and other structures, pulmonary vein stenosis, worsening renal function, and death. The patient understands these risk and wishes to proceed.  We will therefore proceed with catheter ablation once the patient has been adequately anticoagulated.  He has not been compliant with eliquis which was recently prescribed.  I have been very clear today about the importance of anticoagulation prior to ablation.  He will need to be therapeutic with his eliquis for 3 weeks prior to ablation.  2. CAD/ ischemic CM Stable No change required  today

## 2014-03-20 NOTE — Addendum Note (Signed)
Addended by: Janan Halter F on: 03/20/2014 12:40 PM   Modules accepted: Orders

## 2014-03-20 NOTE — Telephone Encounter (Signed)
Pt wants an RX for Sumatriptan.  He uses [facility].  His cardiologist told him it was ok to take it.

## 2014-03-21 ENCOUNTER — Other Ambulatory Visit: Payer: Self-pay | Admitting: Internal Medicine

## 2014-03-21 MED ORDER — SUMATRIPTAN SUCCINATE 100 MG PO TABS: 100.0000 mg | ORAL_TABLET | ORAL | Status: DC | PRN

## 2014-03-27 DIAGNOSIS — G43719 Chronic migraine without aura, intractable, without status migrainosus: Secondary | ICD-10-CM | POA: Diagnosis not present

## 2014-03-27 DIAGNOSIS — G518 Other disorders of facial nerve: Secondary | ICD-10-CM | POA: Diagnosis not present

## 2014-03-27 DIAGNOSIS — G43809 Other migraine, not intractable, without status migrainosus: Secondary | ICD-10-CM | POA: Diagnosis not present

## 2014-03-27 DIAGNOSIS — M542 Cervicalgia: Secondary | ICD-10-CM | POA: Diagnosis not present

## 2014-03-27 DIAGNOSIS — G43019 Migraine without aura, intractable, without status migrainosus: Secondary | ICD-10-CM | POA: Diagnosis not present

## 2014-03-27 DIAGNOSIS — IMO0001 Reserved for inherently not codable concepts without codable children: Secondary | ICD-10-CM | POA: Diagnosis not present

## 2014-03-27 DIAGNOSIS — R51 Headache: Secondary | ICD-10-CM | POA: Diagnosis not present

## 2014-04-04 ENCOUNTER — Encounter: Payer: Self-pay | Admitting: *Deleted

## 2014-04-06 ENCOUNTER — Other Ambulatory Visit: Payer: Self-pay | Admitting: *Deleted

## 2014-04-06 DIAGNOSIS — I4891 Unspecified atrial fibrillation: Secondary | ICD-10-CM

## 2014-04-17 DIAGNOSIS — M542 Cervicalgia: Secondary | ICD-10-CM | POA: Diagnosis not present

## 2014-04-17 DIAGNOSIS — G518 Other disorders of facial nerve: Secondary | ICD-10-CM | POA: Diagnosis not present

## 2014-04-17 DIAGNOSIS — R51 Headache: Secondary | ICD-10-CM | POA: Diagnosis not present

## 2014-04-17 DIAGNOSIS — IMO0001 Reserved for inherently not codable concepts without codable children: Secondary | ICD-10-CM | POA: Diagnosis not present

## 2014-04-17 DIAGNOSIS — G43719 Chronic migraine without aura, intractable, without status migrainosus: Secondary | ICD-10-CM | POA: Diagnosis not present

## 2014-04-29 ENCOUNTER — Other Ambulatory Visit (INDEPENDENT_AMBULATORY_CARE_PROVIDER_SITE_OTHER): Payer: Medicare Other

## 2014-04-29 DIAGNOSIS — I4891 Unspecified atrial fibrillation: Secondary | ICD-10-CM | POA: Diagnosis not present

## 2014-04-29 LAB — CBC WITH DIFFERENTIAL/PLATELET
BASOS ABS: 0 10*3/uL (ref 0.0–0.1)
Basophils Relative: 0.6 % (ref 0.0–3.0)
EOS ABS: 0.2 10*3/uL (ref 0.0–0.7)
Eosinophils Relative: 1.9 % (ref 0.0–5.0)
HCT: 47.3 % (ref 39.0–52.0)
Hemoglobin: 15.8 g/dL (ref 13.0–17.0)
Lymphocytes Relative: 16.7 % (ref 12.0–46.0)
Lymphs Abs: 1.4 10*3/uL (ref 0.7–4.0)
MCHC: 33.3 g/dL (ref 30.0–36.0)
MCV: 94.1 fl (ref 78.0–100.0)
MONO ABS: 0.6 10*3/uL (ref 0.1–1.0)
Monocytes Relative: 7.4 % (ref 3.0–12.0)
NEUTROS PCT: 73.4 % (ref 43.0–77.0)
Neutro Abs: 6.3 10*3/uL (ref 1.4–7.7)
Platelets: 170 10*3/uL (ref 150.0–400.0)
RBC: 5.03 Mil/uL (ref 4.22–5.81)
RDW: 14.2 % (ref 11.5–15.5)
WBC: 8.6 10*3/uL (ref 4.0–10.5)

## 2014-04-29 LAB — BASIC METABOLIC PANEL
BUN: 17 mg/dL (ref 6–23)
CALCIUM: 9 mg/dL (ref 8.4–10.5)
CHLORIDE: 103 meq/L (ref 96–112)
CO2: 30 meq/L (ref 19–32)
CREATININE: 1.2 mg/dL (ref 0.4–1.5)
GFR: 63.91 mL/min (ref >60.00)
Glucose, Bld: 118 mg/dL — ABNORMAL HIGH (ref 70–99)
Potassium: 3.8 mEq/L (ref 3.5–5.1)
Sodium: 137 mEq/L (ref 135–145)

## 2014-05-01 DIAGNOSIS — G43719 Chronic migraine without aura, intractable, without status migrainosus: Secondary | ICD-10-CM | POA: Diagnosis not present

## 2014-05-01 DIAGNOSIS — R51 Headache: Secondary | ICD-10-CM | POA: Diagnosis not present

## 2014-05-01 DIAGNOSIS — G43019 Migraine without aura, intractable, without status migrainosus: Secondary | ICD-10-CM | POA: Diagnosis not present

## 2014-05-01 DIAGNOSIS — IMO0001 Reserved for inherently not codable concepts without codable children: Secondary | ICD-10-CM | POA: Diagnosis not present

## 2014-05-01 DIAGNOSIS — G518 Other disorders of facial nerve: Secondary | ICD-10-CM | POA: Diagnosis not present

## 2014-05-01 DIAGNOSIS — M542 Cervicalgia: Secondary | ICD-10-CM | POA: Diagnosis not present

## 2014-05-06 ENCOUNTER — Encounter (HOSPITAL_COMMUNITY): Payer: Medicare Other | Admitting: Certified Registered Nurse Anesthetist

## 2014-05-06 ENCOUNTER — Ambulatory Visit (HOSPITAL_COMMUNITY): Payer: Medicare Other | Admitting: Certified Registered Nurse Anesthetist

## 2014-05-06 ENCOUNTER — Ambulatory Visit (HOSPITAL_COMMUNITY)
Admission: RE | Admit: 2014-05-06 | Discharge: 2014-05-07 | Disposition: A | Discharge status: Home / Self Care | Payer: Medicare Other | Source: Ambulatory Visit | Attending: Cardiology | Admitting: Cardiology

## 2014-05-06 ENCOUNTER — Encounter (HOSPITAL_COMMUNITY)
Admission: RE | Disposition: A | Discharge status: Home / Self Care | Payer: Self-pay | Source: Ambulatory Visit | Attending: Cardiology

## 2014-05-06 ENCOUNTER — Ambulatory Visit (HOSPITAL_COMMUNITY): Admit: 2014-05-06 | Payer: Self-pay | Admitting: Internal Medicine

## 2014-05-06 ENCOUNTER — Ambulatory Visit (HOSPITAL_COMMUNITY): Admit: 2014-05-06 | Payer: Self-pay | Admitting: Cardiology

## 2014-05-06 ENCOUNTER — Encounter (HOSPITAL_COMMUNITY): Payer: Self-pay | Admitting: Certified Registered Nurse Anesthetist

## 2014-05-06 DIAGNOSIS — Z951 Presence of aortocoronary bypass graft: Secondary | ICD-10-CM | POA: Diagnosis not present

## 2014-05-06 DIAGNOSIS — I4891 Unspecified atrial fibrillation: Secondary | ICD-10-CM | POA: Diagnosis not present

## 2014-05-06 DIAGNOSIS — K219 Gastro-esophageal reflux disease without esophagitis: Secondary | ICD-10-CM | POA: Insufficient documentation

## 2014-05-06 DIAGNOSIS — Z87891 Personal history of nicotine dependence: Secondary | ICD-10-CM | POA: Diagnosis not present

## 2014-05-06 DIAGNOSIS — I10 Essential (primary) hypertension: Secondary | ICD-10-CM | POA: Insufficient documentation

## 2014-05-06 DIAGNOSIS — I483 Typical atrial flutter: Secondary | ICD-10-CM

## 2014-05-06 DIAGNOSIS — I2589 Other forms of chronic ischemic heart disease: Secondary | ICD-10-CM | POA: Diagnosis not present

## 2014-05-06 DIAGNOSIS — I059 Rheumatic mitral valve disease, unspecified: Secondary | ICD-10-CM

## 2014-05-06 DIAGNOSIS — M47817 Spondylosis without myelopathy or radiculopathy, lumbosacral region: Secondary | ICD-10-CM | POA: Diagnosis not present

## 2014-05-06 DIAGNOSIS — R0602 Shortness of breath: Secondary | ICD-10-CM | POA: Diagnosis not present

## 2014-05-06 DIAGNOSIS — I252 Old myocardial infarction: Secondary | ICD-10-CM | POA: Diagnosis not present

## 2014-05-06 DIAGNOSIS — I2510 Atherosclerotic heart disease of native coronary artery without angina pectoris: Secondary | ICD-10-CM | POA: Diagnosis not present

## 2014-05-06 DIAGNOSIS — J45909 Unspecified asthma, uncomplicated: Secondary | ICD-10-CM | POA: Insufficient documentation

## 2014-05-06 DIAGNOSIS — I4892 Unspecified atrial flutter: Secondary | ICD-10-CM | POA: Diagnosis not present

## 2014-05-06 DIAGNOSIS — I480 Paroxysmal atrial fibrillation: Secondary | ICD-10-CM

## 2014-05-06 DIAGNOSIS — I509 Heart failure, unspecified: Secondary | ICD-10-CM | POA: Insufficient documentation

## 2014-05-06 HISTORY — PX: ATRIAL FIBRILLATION ABLATION: SHX5456

## 2014-05-06 HISTORY — PX: TEE WITHOUT CARDIOVERSION: SHX5443

## 2014-05-06 HISTORY — PX: ABLATION: SHX5711

## 2014-05-06 LAB — POCT ACTIVATED CLOTTING TIME
Activated Clotting Time: 248 seconds
Activated Clotting Time: 275 seconds
Activated Clotting Time: 293 seconds

## 2014-05-06 LAB — MRSA PCR SCREENING: MRSA by PCR: NEGATIVE

## 2014-05-06 SURGERY — ATRIAL FIBRILLATION ABLATION
Anesthesia: Monitor Anesthesia Care

## 2014-05-06 SURGERY — ECHOCARDIOGRAM, TRANSESOPHAGEAL
Anesthesia: Moderate Sedation

## 2014-05-06 MED ORDER — BUPIVACAINE HCL (PF) 0.25 % IJ SOLN
INTRAMUSCULAR | Status: AC
  Filled 2014-05-06: qty 30

## 2014-05-06 MED ORDER — FENTANYL CITRATE 0.05 MG/ML IJ SOLN: 25.0000 ug | INTRAMUSCULAR | Status: DC | PRN

## 2014-05-06 MED ORDER — PROPOFOL 10 MG/ML IV BOLUS
INTRAVENOUS | Status: DC | PRN
  Administered 2014-05-06: 30 mg via INTRAVENOUS

## 2014-05-06 MED ORDER — AMIODARONE HCL 200 MG PO TABS
200.0000 mg | ORAL_TABLET | Freq: Every day | ORAL | Status: DC
  Filled 2014-05-06: qty 1

## 2014-05-06 MED ORDER — OXYCODONE HCL 5 MG/5ML PO SOLN: 5.0000 mg | Freq: Once | ORAL | Status: AC | PRN

## 2014-05-06 MED ORDER — PROMETHAZINE HCL 25 MG/ML IJ SOLN: 6.2500 mg | INTRAMUSCULAR | Status: DC | PRN

## 2014-05-06 MED ORDER — MIDAZOLAM HCL 5 MG/5ML IJ SOLN
INTRAMUSCULAR | Status: DC | PRN
  Administered 2014-05-06: 2 mg via INTRAVENOUS

## 2014-05-06 MED ORDER — SODIUM CHLORIDE 0.9 % IV SOLN
INTRAVENOUS | Status: DC
  Administered 2014-05-06: 500 mL via INTRAVENOUS

## 2014-05-06 MED ORDER — ONDANSETRON HCL 4 MG/2ML IJ SOLN: 4.0000 mg | Freq: Four times a day (QID) | INTRAMUSCULAR | Status: DC | PRN

## 2014-05-06 MED ORDER — HEPARIN SODIUM (PORCINE) 1000 UNIT/ML IJ SOLN
INTRAMUSCULAR | Status: AC
  Filled 2014-05-06: qty 1

## 2014-05-06 MED ORDER — PROTAMINE SULFATE 10 MG/ML IV SOLN
INTRAVENOUS | Status: DC | PRN
  Administered 2014-05-06: 20 mg via INTRAVENOUS
  Administered 2014-05-06: 10 mg via INTRAVENOUS

## 2014-05-06 MED ORDER — SODIUM CHLORIDE 0.9 % IJ SOLN: 3.0000 mL | INTRAMUSCULAR | Status: DC | PRN

## 2014-05-06 MED ORDER — SODIUM CHLORIDE 0.9 % IV SOLN: 250.0000 mL | INTRAVENOUS | Status: DC | PRN

## 2014-05-06 MED ORDER — FENTANYL CITRATE 0.05 MG/ML IJ SOLN
INTRAMUSCULAR | Status: DC | PRN
  Administered 2014-05-06 (×3): 25 ug via INTRAVENOUS
  Administered 2014-05-06 (×2): 50 ug via INTRAVENOUS

## 2014-05-06 MED ORDER — HEPARIN SODIUM (PORCINE) 1000 UNIT/ML IJ SOLN
INTRAMUSCULAR | Status: DC | PRN
  Administered 2014-05-06: 13000 [IU] via INTRAVENOUS
  Administered 2014-05-06 (×2): 3000 [IU] via INTRAVENOUS

## 2014-05-06 MED ORDER — APIXABAN 5 MG PO TABS
5.0000 mg | ORAL_TABLET | Freq: Two times a day (BID) | ORAL | Status: DC
  Administered 2014-05-06 – 2014-05-07 (×2): 5 mg via ORAL
  Filled 2014-05-06 (×4): qty 1

## 2014-05-06 MED ORDER — DOBUTAMINE IN D5W 4-5 MG/ML-% IV SOLN
INTRAVENOUS | Status: AC
  Filled 2014-05-06: qty 250

## 2014-05-06 MED ORDER — DOBUTAMINE IN D5W 4-5 MG/ML-% IV SOLN
INTRAVENOUS | Status: DC | PRN
  Administered 2014-05-06: 20 ug/kg/min via INTRAVENOUS

## 2014-05-06 MED ORDER — SUMATRIPTAN SUCCINATE 25 MG PO TABS
25.0000 mg | ORAL_TABLET | Freq: Once | ORAL | Status: AC
  Administered 2014-05-06: 25 mg via ORAL
  Filled 2014-05-06 (×2): qty 1

## 2014-05-06 MED ORDER — MIDAZOLAM HCL 2 MG/2ML IJ SOLN: 0.5000 mg | Freq: Once | INTRAMUSCULAR | Status: AC | PRN

## 2014-05-06 MED ORDER — MIDAZOLAM HCL 5 MG/ML IJ SOLN
INTRAMUSCULAR | Status: AC
  Filled 2014-05-06: qty 2

## 2014-05-06 MED ORDER — GLYCOPYRROLATE 0.2 MG/ML IJ SOLN
INTRAMUSCULAR | Status: DC | PRN
  Administered 2014-05-06 (×2): 0.2 mg via INTRAVENOUS

## 2014-05-06 MED ORDER — PROPOFOL INFUSION 10 MG/ML OPTIME
INTRAVENOUS | Status: DC | PRN
  Administered 2014-05-06: 200 ug/kg/min via INTRAVENOUS

## 2014-05-06 MED ORDER — LACTATED RINGERS IV SOLN
INTRAVENOUS | Status: DC | PRN
  Administered 2014-05-06 (×2): via INTRAVENOUS

## 2014-05-06 MED ORDER — FENTANYL CITRATE 0.05 MG/ML IJ SOLN
INTRAMUSCULAR | Status: DC | PRN
  Administered 2014-05-06 (×3): 25 ug via INTRAVENOUS

## 2014-05-06 MED ORDER — ONDANSETRON HCL 4 MG/2ML IJ SOLN
INTRAMUSCULAR | Status: DC | PRN
  Administered 2014-05-06: 4 mg via INTRAVENOUS

## 2014-05-06 MED ORDER — MIDAZOLAM HCL 10 MG/2ML IJ SOLN
INTRAMUSCULAR | Status: DC | PRN
  Administered 2014-05-06: 1 mg via INTRAVENOUS
  Administered 2014-05-06 (×3): 2 mg via INTRAVENOUS

## 2014-05-06 MED ORDER — OXYCODONE HCL 5 MG PO TABS: 5.0000 mg | ORAL_TABLET | Freq: Once | ORAL | Status: AC | PRN

## 2014-05-06 MED ORDER — BUTAMBEN-TETRACAINE-BENZOCAINE 2-2-14 % EX AERO
INHALATION_SPRAY | CUTANEOUS | Status: DC | PRN
  Administered 2014-05-06: 2 via TOPICAL

## 2014-05-06 MED ORDER — SODIUM CHLORIDE 0.9 % IJ SOLN
3.0000 mL | Freq: Two times a day (BID) | INTRAMUSCULAR | Status: DC
  Administered 2014-05-06: 3 mL via INTRAVENOUS

## 2014-05-06 MED ORDER — FENTANYL CITRATE 0.05 MG/ML IJ SOLN
INTRAMUSCULAR | Status: AC
  Filled 2014-05-06: qty 2

## 2014-05-06 MED ORDER — MEPERIDINE HCL 25 MG/ML IJ SOLN: 6.2500 mg | INTRAMUSCULAR | Status: DC | PRN

## 2014-05-06 NOTE — Anesthesia Procedure Notes (Signed)
Procedure Name: MAC Date/Time: 05/06/2014 11:45 AM Performed by: Ned Grace Pre-anesthesia Checklist: Patient identified, Timeout performed, Emergency Drugs available, Suction available and Patient being monitored Patient Re-evaluated:Patient Re-evaluated prior to inductionOxygen Delivery Method: Simple face mask Intubation Type: IV induction

## 2014-05-06 NOTE — Transfer of Care (Signed)
Immediate Anesthesia Transfer of Care Note  Patient: Keith Vazquez  Procedure(s) Performed: Procedure(s): ATRIAL FIBRILLATION ABLATION (N/A)  Patient Location: PACU  Anesthesia Type:MAC  Level of Consciousness: awake, alert , oriented and patient cooperative  Airway & Oxygen Therapy: Patient Spontanous Breathing  Post-op Assessment: Report given to PACU RN, Post -op Vital signs reviewed and stable and Patient moving all extremities  Post vital signs: Reviewed and stable  Complications: No apparent anesthesia complications

## 2014-05-06 NOTE — Progress Notes (Signed)
Site area: right groin  Site Prior to Removal:  Level 0  Pressure Applied For 20 MINUTES    Manual:   Yes.    Patient Status During Pull:  stable  Post Pull Groin Site:  Level 0  Post Pull Instructions Given:  Yes.    Post Pull Pulses Present:  Yes.    Dressing Applied:  Yes.     Bedrest begins 1540  Comments no complications

## 2014-05-06 NOTE — Anesthesia Preprocedure Evaluation (Addendum)
Anesthesia Evaluation  Patient identified by MRN, date of birth, ID band Patient awake    Reviewed: Allergy & Precautions, H&P , NPO status , Patient's Chart, lab work & pertinent test results, reviewed documented beta blocker date and time   History of Anesthesia Complications Negative for: history of anesthetic complications  Airway Mallampati: II TM Distance: >3 FB Neck ROM: Full    Dental  (+) Dental Advisory Given, Teeth Intact   Pulmonary asthma , former smoker (quit 1982),  breath sounds clear to auscultation  Pulmonary exam normal       Cardiovascular hypertension, Pt. on medications and Pt. on home beta blockers + CAD (2/15 cath: grafts patent, EF 30%), + CABG (grafts patent 2/15) and +CHF + dysrhythmias Atrial Fibrillation Rhythm:Irregular Rate:Normal  7/15 ECHO : EF 40-45%, valves OK   Neuro/Psych negative neurological ROS     GI/Hepatic Neg liver ROS, PUD, GERD-  Medicated and Controlled,  Endo/Other  negative endocrine ROS  Renal/GU negative Renal ROS     Musculoskeletal negative musculoskeletal ROS (+)   Abdominal   Peds  Hematology negative hematology ROS (+)   Anesthesia Other Findings   Reproductive/Obstetrics                       Anesthesia Physical Anesthesia Plan  ASA: III  Anesthesia Plan: MAC   Post-op Pain Management:    Induction: Intravenous  Airway Management Planned: Natural Airway and Simple Face Mask  Additional Equipment:   Intra-op Plan:   Post-operative Plan:   Informed Consent: I have reviewed the patients History and Physical, chart, labs and discussed the procedure including the risks, benefits and alternatives for the proposed anesthesia with the patient or authorized representative who has indicated his/her understanding and acceptance.   Dental advisory given  Plan Discussed with: CRNA, Anesthesiologist and Surgeon  Anesthesia Plan Comments:  (Plan routine monitors, MAC)       Anesthesia Quick Evaluation

## 2014-05-06 NOTE — Progress Notes (Signed)
Right femoral venous sheaths x 3 removed

## 2014-05-06 NOTE — Progress Notes (Signed)
IV saline locked. Transferred to 1U83

## 2014-05-06 NOTE — H&P (Signed)
The patient presented for a TEE to rule out intracardiac source of embolism prior to the a-fib ablation. His last office note with Dr [name] is copied bellow:    Progress Notes   Keith Vazquez (MR# [id number])       Progress Notes Info    Author Note Status Last Update User Last Update Date/Time    Thompson Grayer, MD Signed Thompson Grayer, MD 03/20/2014 10:42 AM      Progress Notes     Primary Care Physician: Scarlette Calico, MD Referring Physician:  Dr Etheleen Nicks is a 63 y.o. male with a h/o paroxysmal atrial fibrillation and atrial flutter who presents today for further evaluation of his atrial arrhythmias.  He reports that he has had atrial fibrillation for several years.  He reports SOB and fatigue with his afib.  He is also felt to have worsening CHF with afib.  He finds that he is frequently breathless at night.  He has been placed on amiodarone but continues to have both afib and atrial flutter.   Today, he denies symptoms of  chest pain,  lower extremity edema, dizziness, presyncope, syncope, or neurologic sequela. The patient is tolerating medications without difficulties and is otherwise without complaint today.     Past Medical History   Diagnosis  Date   .  Hypertension         Borderline   .  Degeneration of lumbar intervertebral disc     .  GERD (gastroesophageal reflux disease)     .  Ischemic heart disease         s/p MI, CABG 2000, s/p cath 2002 with 4/5 grafts patent (LIMA to LAD atretic and occluded mid vessel but adequate flow to distal LAD from collaterals / diagonal   .  Low back pain     .  Peyronie's disease     .  PAC (premature atrial contraction)     .  PAF (paroxysmal atrial fibrillation)         initially diagnosed during stress test 2002   .  Coronary artery disease         CABG x5 in 2000; s/p cath 2002 > 4/5 grafts patent, LVEF 50%   .  CHF (congestive heart failure)         LVEF at time of cath 2002 50%; now has been 35-40%  since 2008 (patient has seen multiple cardiologists over the years including [facility], [place], [facility], [facility] and Tollie Eth, MD)   .  Atrial flutter     .  GI bleed         duodenal ulcer       Past Surgical History   Procedure  Laterality  Date   .  Cardiac catheterization    09/14/2001        Mildly decreased left ventricular systolic function --  Native three vessel coronary artery disease as described. -- Status post coronary artery bypass grafting   .  Hernia repair    05/16/2001       Large left indirect inguinal hernia with right direct hernia.   Marland Kitchen  Hernia repair           Recurrent left inguinal hernia -- Large left indirect inguinal hernia with right direct hernia.   .  Coronary artery bypass graft    2000       LIMA to LAD, SVG to second diagonal, seq SVG  to ramus and OM, SVG to PDA         Current Outpatient Prescriptions   Medication  Sig  Dispense  Refill   .  amiodarone (PACERONE) 200 MG tablet  Take 1 tablet (200 mg total) by mouth 2 (two) times daily.   60 tablet   11   .  Ascorbic Acid (VITAMIN C) 100 MG tablet  Take 100 mg by mouth daily.           Marland Kitchen  atorvastatin (LIPITOR) 10 MG tablet  Take 1 tablet (10 mg total) by mouth daily.   30 tablet   11   .  b complex vitamins capsule  Take 1 capsule by mouth daily.           .  carvedilol (COREG) 6.25 MG tablet  Take 1 tablet (6.25 mg total) by mouth 2 (two) times daily with a meal.   180 tablet   3   .  celecoxib (CELEBREX) 200 MG capsule  Take 200 mg by mouth daily as needed for moderate pain.          .  Cholecalciferol (VITAMIN D) 1000 UNITS capsule  Take 1,000 Units by mouth daily.           .  niacin 250 MG CR capsule  Take 250 mg by mouth daily.           .  Omega-3 Fatty Acids (FISH OIL PO)  Take 2,400 mg by mouth daily.          .  ramipril (ALTACE) 2.5 MG capsule  Take 1 capsule (2.5 mg total) by mouth daily.   90 capsule   1   .  ranitidine (ZANTAC) 150 MG capsule   Take 1 capsule (150 mg total) by mouth 2 (two) times daily.   180 capsule   1   .  Flavocoxid (LIMBREL) 500 MG CAPS  Take 500 mg by mouth daily.               No current facility-administered medications for this visit.         Allergies   Allergen  Reactions   .  Oxycodone-Acetaminophen  Nausea Only   .  Propoxyphene N-Acetaminophen           History       Social History   .  Marital Status:  Single       Spouse Name:  N/A       Number of Children:  N/A   .  Years of Education:  N/A       Occupational History   .  Not on file.       Social History Main Topics   .  Smoking status:  Former Research scientist (life sciences)   .  Smokeless tobacco:  Not on file   .  Alcohol Use:  No   .  Drug Use:  No   .  Sexual Activity:  Not on file       Other Topics  Concern   .  Not on file       Social History Narrative     Lives in [place].  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  Also flies airplanes         Family History   Problem  Relation  Age of Onset   .  Adopted: Yes        ROS- All systems are reviewed and negative except as per the  HPI above   Physical Exam: Filed Vitals:     03/20/14 0904   BP:  136/90   Pulse:  59   Height:  6\' 2"  (1.88 m)   Weight:  209 lb (94.802 kg)        GEN- The patient is well appearing, alert and oriented x 3 today.    Head- normocephalic, atraumatic Eyes-  Sclera clear, conjunctiva pink Ears- hearing intact Oropharynx- clear Neck- supple, no JVP Lymph- no cervical lymphadenopathy Lungs- Clear to ausculation bilaterally, normal work of breathing Heart- Regular rate and rhythm, no murmurs, rubs or gallops, PMI not laterally displaced GI- soft, NT, ND, + BS Extremities- no clubbing, cyanosis, or edema MS- no significant deformity or atrophy Skin- no rash or lesion Psych- euthymic mood, full affect Neuro- strength and sensation are intact   EKG today reveals sinus rhythm 59 bpm, PR 220, QTc 445, inferior infarction Epic  records including echo and Dr Forde Dandy notes are reviewed Recent holter monitor is reviewed and reveals both afib and atrial flutter   Assessment and Plan:   1. afib and atrial flutter The patient has symptomatic paroxysmal atrial arrhythmias.  He has failed medical therapy with coreg and amiodarone.  His CHADS2VASC Score is at least 3.  He declines anticoagulation. Therapeutic strategies for afib and atrial flutter including medicine and ablation were discussed in detail with the patient today. Risk, benefits, and alternatives to EP study and radiofrequency ablation  were also discussed in detail today. These risks include but are not limited to stroke, bleeding, vascular damage, tamponade, perforation, damage to the esophagus, lungs, and other structures, pulmonary vein stenosis, worsening renal function, and death. The patient understands these risk and wishes to proceed.  We will therefore proceed with catheter ablation once the patient has been adequately anticoagulated.  He has not been compliant with eliquis which was recently prescribed.  I have been very clear today about the importance of anticoagulation prior to ablation.  He will need to be therapeutic with his eliquis for 3 weeks prior to ablation.   2. CAD/ ischemic CM Stable No change required today  [name] 05/06/2014

## 2014-05-06 NOTE — Progress Notes (Signed)
ACT drawn.

## 2014-05-06 NOTE — Op Note (Signed)
SURGEON:  Thompson Grayer, MD  PREPROCEDURE DIAGNOSES: 1. Paroxysmal atrial fibrillation. 2. Typical appearing atrial flutter  POSTPROCEDURE DIAGNOSES: 1. Paroxysmal  atrial fibrillation. 2. Isthmus dependant right atrial flutter  PROCEDURES: 1. Comprehensive electrophysiologic study. 2. Coronary sinus pacing and recording. 3. Three-dimensional mapping of atrial fibrillation with additional mapping and ablation of a second discrete focus (atrial flutter) 4. Ablation of atrial fibrillation with additional mapping and ablation of a second discrete focus (atrial flutter) 5. Intracardiac echocardiography. 6. Transseptal puncture of an intact septum. 7. Rotational Angiography with processing at an independent workstation 8. Arrhythmia induction with pacing with dobutamine infusion  INTRODUCTION:  Keith Vazquez is a 63 y.o. male with a history of paroxysmal atrial fibrillation and typical appearing atrial flutter who now presents for EP study and radiofrequency ablation.  The patient reports initially being diagnosed with atrial fibrillation after presenting with symptomatic palpitations and fatgiue. The patient reports increasing frequency and duration of atrial arrhythmias since that time.  The patient has failed medical therapy with coreg and amiodarone.  The patient therefore presents today for catheter ablation of atrial fibrillation and atrial flutter.  DESCRIPTION OF PROCEDURE:  Informed written consent was obtained, and the patient was brought to the electrophysiology lab in a fasting state.  The patient was adequately sedated with intravenous medications as outlined in the anesthesia report.  The patient's left and right groins were prepped and draped in the usual sterile fashion by the EP lab staff.  Using a percutaneous Seldinger technique, two 7-French and one 11-French hemostasis sheaths were placed into the right common femoral vein.    3 Dimensional Rotational Angiography: A 5  french pigtail catheter was introduced through the right common femoral vein and advanced into the inferior venocava.  3 demential rotational angiography was then performed by power injection of 100cc of nonionic contrast.  Reprocessing at an independent work station was then performed.   This demonstrated a moderate sized left atrium with 4 separate pulmonary veins which were also moderate in size.  There was a short common segment for the left superior and left inferior pulmonary veins.  A 3 dimensional rendering of the left atrium was then merged using [employer] onto the [employer] system and registered with intracardiac echo (see below).  The pigtail catheter was then removed.  Catheter Placement:  A 7-French Biosense Webster Decapolar coronary sinus catheter was introduced through the right common femoral vein and advanced into the coronary sinus for recording and pacing from this location.  A quadrapolar catheter was introduced through the right common femoral vein and advanced into the right ventricle for recording and pacing.  This catheter was then pulled back to the His bundle location.    Initial Measurements: The patient presented to the electrophysiology lab in sinus rhythm.  The patients PR interval measured 195 msec with a QRS duration of 94 msec and a QT interval of 465 msec.  The AH interval measured 110 msec and the HV interval measured 51 msec.     Intracardiac Echocardiography: A 10-French Biosense Webster AcuNav intracardiac echocardiography catheter was introduced through the left common femoral vein and advanced into the right atrium. Intracardiac echocardiography was performed of the left atrium, and a three-dimensional anatomical rendering of the left atrium was performed using CARTO sound technology.  The patient was noted to have a moderate sized left atrium.  The interatrial septum was prominent and slightly aneurysmal. All 4 pulmonary veins were visualized.  The  right pulmonary veins were  noted to have separate ostia.  There was a short common segment for the left superior and left inferior pulmonary veins. The pulmonary veins were moderate in size.  The left atrial appendage was visualized and did not reveal thrombus.   There was no evidence of pulmonary vein stenosis.   Transseptal Puncture: The middle right common femoral vein sheath was exchanged for an 8.5 [place] SL2 transseptal sheath and transseptal access was achieved in a standard fashion using a Brockenbrough needle under biplane fluoroscopy with intracardiac echocardiography confirmation of the transseptal puncture.  Once transseptal access had been achieved, heparin was administered intravenously and intra- arterially in order to maintain an ACT of greater than 300 seconds throughout the procedure.   3D Mapping and Ablation: The His bundle catheter was removed and in its place a 3.5 mm Biosense TransMontaigne ablation catheter was advanced into the right atrium.  The transseptal sheath was pulled back into the IVC over a guidewire.  The ablation catheter was advanced across the transseptal hole using the wire as a guide.  The transseptal sheath was then re-advanced over the guidewire into the left atrium.  A duodecapolar Biosense Webster circular mapping catheter was introduced through the transseptal sheath and positioned over the mouth of all 4 pulmonary veins.  Three-dimensional electroanatomical mapping was performed using CARTO technology.  This demonstrated electrical activity within all four pulmonary veins at baseline. The patient underwent successful sequential electrical isolation and anatomical encircling of all four pulmonary veins using radiofrequency current with a circular mapping catheter as a guide.  The left inferior and superior pulmonary veins were isolated today at their short common segment. The ablation catheter was then pulled back into the right atrial and  positioned along the cavo-tricuspid isthmus.  Rapid atrial pacing induced typical appearing atrial flutter with a CL of 315 msec.  The CS activation sequence was proximal to distal and suggestive of right atrial flutter.  The surface ekg was suggestive of typical atrial flutter. Entrainment  Mapping was performed.  When pacing from the left atrium the PPI was much greater than the TCL.  When pacing from the CTI, the PPI = TCL.  The patient was therefore felt to have isthmus dependant right atrial flutter.  Atrial mapping demonstrated a standard isthmus.  A series of radiofrequency applications were then delivered along the isthmus.  Complete bidirectional cavotricuspid isthmus block was achieved as confirmed by differential atrial pacing from the low lateral right atrium.  A stimulus to earliest atrial activation across the isthmus measured 160 msec bi-directionally.  The patient was observe without return of conduction through the isthmus.  Measurements Following Ablation: Following ablation, dobutamine was infused up to 20 mcg/kg/min with no inducible atrial fibrillation, atrial tachycardia, atrial flutter, or sustained PACs. In sinus rhythm with RR interval was 506 msec, with PR 203 msec, QRS 119 msec, and Qt 432 msec.  Following ablation the AH interval measured 103 msec with an HV interval of 55 msec. Ventricular pacing was performed, which revealed midline concentric VA conduction with a VA WCL of 690 msec.  Rapid atrial pacing was performed, which revealed an AV Wenckebach cycle length of 470 msec.  Electroisolation was then again confirmed in all four pulmonary veins.  Intracardiac echocardiography was again performed, which revealed no pericardial effusion.  The procedure was therefore considered completed.  All catheters were removed, and the sheaths were aspirated and flushed.  The patient was transferred to the recovery area for sheath removal per protocol.  A  limited bedside transthoracic  echocardiogram revealed no pericardial effusion.  There were no early apparent complications.  CONCLUSIONS: 1. Sinus rhythm upon presentation.   2. Rotational Angiography reveals a moderate sized left atrium with a short common ostium to the left superior and inferior pulmonary veins without evidence of pulmonary vein stenosis. 3. Successful electrical isolation and anatomical encircling of all four pulmonary veins with radiofrequency current.    4. Cavo-tricuspid isthmus ablation was performed with complete bidirectional isthmus block achieved.  5. No inducible arrhythmias following ablation both on and off of dobutamine 6. No early apparent complications.   Keith Andrell Tallman,MD 2:26 PM 05/06/2014

## 2014-05-06 NOTE — H&P (Signed)
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  He reports that he has had atrial fibrillation for several years. He reports SOB and fatigue with his afib. He is also felt to have worsening CHF with afib. He finds that he is frequently breathless at night. He has been placed on amiodarone but continues to have both afib and atrial flutter.  Today, he denies symptoms of chest pain, lower extremity edema, dizziness, presyncope, syncope, or neurologic sequela. The patient is tolerating medications without difficulties and is otherwise without complaint today.   Past Medical History   Diagnosis  Date   .  Hypertension      Borderline   .  Degeneration of lumbar intervertebral disc    .  GERD (gastroesophageal reflux disease)    .  Ischemic heart disease      s/p MI, CABG 2000, s/p cath 2002 with 4/5 grafts patent (LIMA to LAD atretic and occluded mid vessel but adequate flow to distal LAD from collaterals / diagonal   .  Low back pain    .  Peyronie's disease    .  PAC (premature atrial contraction)    .  PAF (paroxysmal atrial fibrillation)      initially diagnosed during stress test 2002   .  Coronary artery disease      CABG x5 in 2000; s/p cath 2002 > 4/5 grafts patent, LVEF 50%   .  CHF (congestive heart failure)      LVEF at time of cath 2002 50%; now has been 35-40% since 2008 (patient has seen multiple cardiologists over the years including [facility], [place], [facility], [facility] and Tollie Eth, MD)   .  Atrial flutter    .  GI bleed      duodenal ulcer    Past Surgical History   Procedure  Laterality  Date   .  Cardiac catheterization   09/14/2001     Mildly decreased left ventricular systolic function -- Native three vessel coronary artery disease as described. -- Status post coronary  artery bypass grafting   .  Hernia repair   05/16/2001     Large left indirect inguinal hernia with right direct hernia.   Marland Kitchen  Hernia repair       Recurrent left inguinal hernia -- Large left indirect inguinal hernia with right direct hernia.   .  Coronary artery bypass graft   2000     LIMA to LAD, SVG to second diagonal, seq SVG to ramus and OM, SVG to PDA    Current Outpatient Prescriptions   Medication  Sig  Dispense  Refill   .  amiodarone (PACERONE) 200 MG tablet  Take 1 tablet (200 mg total) by mouth 2 (two) times daily.  60 tablet  11   .  Ascorbic Acid (VITAMIN C) 100 MG tablet  Take 100 mg by mouth daily.     Marland Kitchen  atorvastatin (LIPITOR) 10 MG tablet  Take 1 tablet (10 mg total) by mouth daily.  30 tablet  11   .  b complex vitamins capsule  Take 1 capsule by mouth daily.     .  carvedilol (COREG) 6.25 MG tablet  Take 1 tablet (6.25 mg total) by mouth 2 (two) times daily with a meal.  180 tablet  3   .  celecoxib (CELEBREX) 200 MG capsule  Take 200 mg by mouth daily as needed for moderate pain.     .  Cholecalciferol (VITAMIN D) 1000 UNITS capsule  Take 1,000 Units by mouth daily.     .  niacin 250 MG CR capsule  Take 250 mg by mouth daily.     .  Omega-3 Fatty Acids (FISH OIL PO)  Take 2,400 mg by mouth daily.     .  ramipril (ALTACE) 2.5 MG capsule  Take 1 capsule (2.5 mg total) by mouth daily.  90 capsule  1   .  ranitidine (ZANTAC) 150 MG capsule  Take 1 capsule (150 mg total) by mouth 2 (two) times daily.  180 capsule  1   .  Flavocoxid (LIMBREL) 500 MG CAPS  Take 500 mg by mouth daily.      No current facility-administered medications for this visit.    Allergies   Allergen  Reactions   .  Oxycodone-Acetaminophen  Nausea Only   .  Propoxyphene N-Acetaminophen     History    Social History   .  Marital Status:  Single     Spouse Name:  N/A     Number of Children:  N/A   .  Years of Education:  N/A    Occupational History   .  Not on file.    Social History Main  Topics   .  Smoking status:  Former Research scientist (life sciences)   .  Smokeless tobacco:  Not on file   .  Alcohol Use:  No   .  Drug Use:  No   .  Sexual Activity:  Not on file    Other Topics  Concern   .  Not on file    Social History Narrative    Lives in [place]. 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  Adopted: Yes   ROS- All systems are reviewed and negative except as per the HPI above  Physical Exam:  Filed Vitals:   05/06/14 0930  BP: 121/85  Pulse:   Temp:   Resp: 11    GEN- The patient is well appearing, alert and oriented x 3 today.  Head- normocephalic, atraumatic  Eyes- Sclera clear, conjunctiva pink  Ears- hearing intact  Oropharynx- clear  Neck- supple, no JVP  Lymph- no cervical lymphadenopathy  Lungs- Clear to ausculation bilaterally, normal work of breathing  Heart- Regular rate and rhythm, no murmurs, rubs or gallops, PMI not laterally displaced  GI- soft, NT, ND, + BS  Extremities- no clubbing, cyanosis, or edema  MS- no significant deformity or atrophy  Skin- no rash or lesion  Psych- euthymic mood, full affect  Neuro- strength and sensation are intact   Darden Dates is reviewed  Assessment and Plan:  1. afib and atrial flutter  The patient has symptomatic paroxysmal atrial arrhythmias. He has failed medical therapy with coreg and amiodarone. His CHADS2VASC Score is at least 3. He reports complaince with eliquis. Therapeutic strategies for afib and atrial flutter including medicine and ablation were discussed in detail with the patient today. Risk, benefits, and alternatives to EP study and radiofrequency ablation were also discussed in detail today. These risks include but are not limited to stroke, bleeding, vascular damage, tamponade, perforation, damage to the esophagus, lungs, and other structures, pulmonary vein stenosis, worsening renal function, and death. The patient understands these  risk and wishes to proceed.

## 2014-05-06 NOTE — Progress Notes (Signed)
ACT 157

## 2014-05-06 NOTE — CV Procedure (Signed)
     Transesophageal Echocardiogram Note  Keith Vazquez [id number] 1951-08-22  Procedure: Transesophageal Echocardiogram Indications: a-fib  Procedure Details Consent: Obtained Time Out: Verified patient identification, verified procedure, site/side was marked, verified correct patient position, special equipment/implants available, Radiology Safety Procedures followed,  medications/allergies/relevent history reviewed, required imaging and test results available.  Performed  Medications: Fentanyl: 75 mcg Versed: 7 mg  Left Ventrical:  Systolic function was mildly to moderately reduced. The estimated ejection fraction was in the range of 40% to 45%.   Mitral Valve: Mild regurgitation.  Aortic Valve: No AI, trileaflet  Tricuspid Valve: No regurgitation.  Pulmonic Valve: No regurgitation.  Left Atrium/ Left atrial appendage: Small appendage, no thrombus in LA or LAA. Mildly decreased filling and emptying velocities.  Atrial septum: No defect or patent foramen ovale was identified.  Right ventricle: The cavity size was normal. Wall thickness was normal. Systolic function was normal.  Right atrium: The atrium was normal in size.  Aorta: Mild non-mobile plague in the descending thoracic aorta.   Complications: No apparent complications Patient did tolerate procedure well.  [name], MD, [facility] 05/06/2014, 7:41 AM

## 2014-05-06 NOTE — Discharge Summary (Signed)
ELECTROPHYSIOLOGY PROCEDURE DISCHARGE SUMMARY    Patient ID: Keith Vazquez,  MRN: [id number], DOB/AGE: 1950/11/21 63 y.o.  Admit date: 05/06/2014 Discharge date: 05/07/2014  Primary Care Physician: Scarlette Calico, MD Electrophysiologist: Thompson Grayer, MD  Primary Discharge Diagnosis:  Paroxysmal atrial fibrillation and atrial flutter status post ablation this admission  Secondary Discharge Diagnosis:  1.  CAD s/p CABG 2002; last cath 2002 4/5 grafts patent 2.  Hypertension 3.  Ischemic cardiomyopathy - EF 40-45%   Procedures This Admission:  1.  Electrophysiology study and radiofrequency catheter ablation on 05-06-14 by Dr Thompson Grayer.  This study demonstrated sinus rhythm upon presentation; rotational Angiography reveals a moderate sized left atrium with a short common ostium to the left superior and inferior pulmonary veins without evidence of pulmonary vein stenosis; successful electrical isolation and anatomical encircling of all four pulmonary veins with radiofrequency current; cavo-tricuspid isthmus ablation was performed with complete bidirectional isthmus block achieved; no inducible arrhythmias following ablation both on and off of dobutamine. There were no early apparent complications.   Brief HPI: Keith Vazquez is a 63 y.o. male with a history of paroxysmal atrial fibrillation and atrial flutter.  They have failed medical therapy with Coreg and Amiodarone. Risks, benefits, and alternatives to catheter ablation of atrial fibrillation were reviewed with the patient who wished to proceed.  The patient underwent TEE prior to the procedure which demonstrated normal LV function and no LAA thrombus.    Hospital Course:  The patient was admitted and underwent EPS/RFCA of atrial fibrillation with details as outlined above.  They were monitored on telemetry overnight which demonstrated sinus rhythm.  Groin was without complication on the day of discharge.  The patient was examined by  Dr [name] and considered to be stable for discharge.  Wound care and restrictions were reviewed with the patient.  The patient will be seen back by Dr [name] in 12 weeks for post ablation follow up.   Discharge Vitals: Blood pressure 130/86, pulse 60, temperature 98.4 F (36.9 C), temperature source Oral, resp. rate 13, height 6\' 2"  (1.88 m), weight 217 lb 2.5 oz (98.5 kg), SpO2 98.00%.  Physical Exam: Filed Vitals:   05/07/14 0400 05/07/14 0500 05/07/14 0600 05/07/14 0804  BP: 123/78  120/78 130/86  Pulse:  55 56 60  Temp: 98.1 F (36.7 C)   98.4 F (36.9 C)  TempSrc: Oral   Oral  Resp:  11 15 13   Height:      Weight:      SpO2:  92% 96% 98%    GEN- The patient is well appearing, alert and oriented x 3 today.   Head- normocephalic, atraumatic Eyes-  Sclera clear, conjunctiva pink Ears- hearing intact Oropharynx- clear Neck- supple, Lungs- Clear to ausculation bilaterally, normal work of breathing Heart- Regular rate and rhythm, no murmurs, rubs or gallops, PMI not laterally displaced GI- soft, NT, ND, + BS Extremities- no clubbing, cyanosis, or edema, groin is without hematoma/ bruit MS- no significant deformity or atrophy Skin- no rash or lesion Psych- euthymic mood, full affect Neuro- strength and sensation are intact   Labs:   Lab Results  Component Value Date   WBC 8.6 04/29/2014   HGB 15.8 04/29/2014   HCT 47.3 04/29/2014   MCV 94.1 04/29/2014   PLT 170.0 04/29/2014     Recent Labs Lab 05/07/14 0331  NA 139  K 3.9  CL 101  CO2 25  BUN 13  CREATININE 0.96  CALCIUM 8.4  GLUCOSE  101*      Discharge Medications:    Medication List         amiodarone 200 MG tablet  Commonly known as:  PACERONE  Take 1 tablet (200 mg total) by mouth daily.     apixaban 5 MG Tabs tablet  Commonly known as:  ELIQUIS  Take 1 tablet (5 mg total) by mouth 2 (two) times daily.     atorvastatin 10 MG tablet  Commonly known as:  LIPITOR  Take 1 tablet (10 mg total) by  mouth daily.     b complex vitamins capsule  Take 1 capsule by mouth daily.     carvedilol 6.25 MG tablet  Commonly known as:  COREG  Take 3.125 mg by mouth 2 (two) times daily with a meal.     celecoxib 200 MG capsule  Commonly known as:  CELEBREX  Take 200 mg by mouth daily as needed for moderate pain.     FISH OIL PO  Take 2,400 mg by mouth daily.     LIMBREL 500 MG Caps  Generic drug:  Flavocoxid  Take 500 mg by mouth daily.     niacin 250 MG CR capsule  Take 250 mg by mouth daily.     ramipril 2.5 MG capsule  Commonly known as:  ALTACE  Take 1 capsule (2.5 mg total) by mouth daily.     ranitidine 150 MG capsule  Commonly known as:  ZANTAC  Take 1 capsule (150 mg total) by mouth 2 (two) times daily.     SUMAtriptan 100 MG tablet  Commonly known as:  IMITREX  Take 1 tablet (100 mg total) by mouth every 2 (two) hours as needed for migraine or headache. May repeat in 2 hours if headache persists or recurs.     vitamin C 100 MG tablet  Take 500 mg by mouth daily.     Vitamin D 1000 UNITS capsule  Take 1,000 Units by mouth daily.        Disposition:   Follow-up Information   Follow up with Thompson Grayer, MD In 3 months.   Specialty:  Cardiology   Contact information:   [place] [id number] [phone]       Duration of Discharge Encounter: Greater than 30 minutes including physician time.  Signed,  Thompson Grayer MD

## 2014-05-07 ENCOUNTER — Encounter (HOSPITAL_COMMUNITY): Payer: Self-pay | Admitting: *Deleted

## 2014-05-07 DIAGNOSIS — I4892 Unspecified atrial flutter: Secondary | ICD-10-CM | POA: Diagnosis not present

## 2014-05-07 DIAGNOSIS — I2510 Atherosclerotic heart disease of native coronary artery without angina pectoris: Secondary | ICD-10-CM | POA: Diagnosis not present

## 2014-05-07 DIAGNOSIS — I4891 Unspecified atrial fibrillation: Secondary | ICD-10-CM

## 2014-05-07 DIAGNOSIS — Z951 Presence of aortocoronary bypass graft: Secondary | ICD-10-CM | POA: Diagnosis not present

## 2014-05-07 LAB — BASIC METABOLIC PANEL
ANION GAP: 13 (ref 5–15)
BUN: 13 mg/dL (ref 6–23)
CO2: 25 mEq/L (ref 19–32)
Calcium: 8.4 mg/dL (ref 8.4–10.5)
Chloride: 101 mEq/L (ref 96–112)
Creatinine, Ser: 0.96 mg/dL (ref 0.50–1.35)
GFR calc Af Amer: >90 mL/min (ref >90)
GFR, EST NON AFRICAN AMERICAN: 85 mL/min — AB (ref >90)
Glucose, Bld: 101 mg/dL — ABNORMAL HIGH (ref 70–99)
POTASSIUM: 3.9 meq/L (ref 3.7–5.3)
SODIUM: 139 meq/L (ref 137–147)

## 2014-05-07 LAB — POCT ACTIVATED CLOTTING TIME: Activated Clotting Time: 157 seconds

## 2014-05-07 MED ORDER — AMIODARONE HCL 200 MG PO TABS: 200.0000 mg | ORAL_TABLET | Freq: Every day | ORAL | Status: DC

## 2014-05-07 MED ORDER — SUMATRIPTAN SUCCINATE 25 MG PO TABS
25.0000 mg | ORAL_TABLET | Freq: Once | ORAL | Status: AC
  Administered 2014-05-07: 25 mg via ORAL
  Filled 2014-05-07: qty 1

## 2014-05-07 NOTE — Anesthesia Postprocedure Evaluation (Signed)
Anesthesia Post Note  Patient: Keith Vazquez  Procedure(s) Performed: Procedure(s) (LRB): ATRIAL FIBRILLATION ABLATION (N/A)  Anesthesia type: MAC  Patient location: PACU  Post pain: Pain level controlled  Post assessment: Patient's Cardiovascular Status Stable  Last Vitals:  Filed Vitals:   05/07/14 0600  BP: 120/78  Pulse: 56  Temp:   Resp: 15    Post vital signs: Reviewed and stable  Level of consciousness: alert  Complications: No apparent anesthesia complications

## 2014-05-07 NOTE — Discharge Instructions (Signed)
No driving for 5 days. No lifting over 5 lbs for 1 week. No sexual activity for 1 week. You may return to work in 1 week. Keep procedure site clean & dry. If you notice increased pain, swelling, bleeding or pus, call/return!  You may shower, but no soaking baths/hot tubs/pools for 1 week.  ° ° °

## 2014-05-29 ENCOUNTER — Other Ambulatory Visit: Payer: Self-pay | Admitting: Internal Medicine

## 2014-06-24 ENCOUNTER — Telehealth: Payer: Self-pay | Admitting: Internal Medicine

## 2014-06-24 NOTE — Telephone Encounter (Signed)
New message     Pt tried to reduce amiodarone from 200mg  bid.  When he takes it 100mg  daily or 150mg  daily----he is having to take more breaths---he said not really sob just more breaths. He is now back on 200mg  bid.  Could this be another problem--not heart maybe lung or something?

## 2014-06-25 NOTE — Telephone Encounter (Addendum)
Tried to decrease his Amiodarone to 100mg  twice daily but when he does he feels as though he can not breath well.  I have let him know we will move his appointment up with Dr. [name] and Roderic Palau

## 2014-06-30 ENCOUNTER — Ambulatory Visit (INDEPENDENT_AMBULATORY_CARE_PROVIDER_SITE_OTHER): Payer: Medicare Other | Admitting: Internal Medicine

## 2014-06-30 ENCOUNTER — Encounter: Payer: Self-pay | Admitting: Internal Medicine

## 2014-06-30 VITALS — BP 140/84 | HR 69 | Ht 74.0 in | Wt 205.6 lb

## 2014-06-30 DIAGNOSIS — I4891 Unspecified atrial fibrillation: Secondary | ICD-10-CM | POA: Diagnosis not present

## 2014-06-30 DIAGNOSIS — I2510 Atherosclerotic heart disease of native coronary artery without angina pectoris: Secondary | ICD-10-CM | POA: Diagnosis not present

## 2014-06-30 DIAGNOSIS — R0600 Dyspnea, unspecified: Secondary | ICD-10-CM

## 2014-06-30 DIAGNOSIS — R0989 Other specified symptoms and signs involving the circulatory and respiratory systems: Secondary | ICD-10-CM

## 2014-06-30 DIAGNOSIS — R0609 Other forms of dyspnea: Secondary | ICD-10-CM | POA: Diagnosis not present

## 2014-06-30 NOTE — Progress Notes (Signed)
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 male with a h/o paroxysmal atrial fibrillation and atrial flutter who presents today for further evaluation  following afib ablation 05/05/14. He is soon to be 2 months out from procedure. He continues on Eliquis and is aware he will have to continue another month, if not longer for a chadsvasc of 3. He was asked to stop amiodarone after the procedure which he has attempted 3 separate times but insists he has more shortness of breath when he reduces the dose. He complains today of fatigue as well. . Denies snoring history but does live by himself. He has not been aware of any irregular heart beat unless SOB with reduced amio dose represents PAF.  Today, he denies symptoms of  chest pain,  lower extremity edema, dizziness, presyncope, syncope, or neurologic sequela. The patient is tolerating medications without difficulties and is otherwise without complaint today.   Past Medical History  Diagnosis Date  . Hypertension     Borderline  . Degeneration of lumbar intervertebral disc   . GERD (gastroesophageal reflux disease)   . Ischemic heart disease     s/p MI, CABG 2000, s/p cath 2002 with 4/5 grafts patent (LIMA to LAD atretic and occluded mid vessel but adequate flow to distal LAD from collaterals / diagonal  . Low back pain   . Peyronie's disease   . PAC (premature atrial contraction)   . PAF (paroxysmal atrial fibrillation)     initially diagnosed during stress test 2002  . Coronary artery disease     CABG x5 in 2000; s/p cath 2002 > 4/5 grafts patent, LVEF 50%  . CHF (congestive heart failure)     LVEF at time of cath 2002 50%; now has been 35-40% since 2008 (patient has seen multiple cardiologists over the years including [facility], [place] [place] [facility], [facility] and Tollie Eth, MD)  . Atrial flutter   . GI bleed     duodenal ulcer   Past Surgical  History  Procedure Laterality Date  . Cardiac catheterization  09/14/2001     Mildly decreased left ventricular systolic function --  Native three vessel coronary artery disease as described. -- Status post coronary artery bypass grafting  . Hernia repair  05/16/2001    Large left indirect inguinal hernia with right direct hernia.  Marland Kitchen Hernia repair      Recurrent left inguinal hernia -- Large left indirect inguinal hernia with right direct hernia.  . Coronary artery bypass graft  2000    LIMA to LAD, SVG to second diagonal, seq SVG to ramus and OM, SVG to PDA  . Ablation  05-06-2014    PVI and CTI ablation by Dr [name]  . Tee without cardioversion N/A 05/06/2014    Procedure: TRANSESOPHAGEAL ECHOCARDIOGRAM (TEE);  Surgeon: [name], MD;  Location: [facility] END[facility]COPY;  Service: Cardiovascular;  Laterality: N/A;    Current Outpatient Prescriptions  Medication Sig Dispense Refill  . apixaban (ELIQUIS) 5 MG TABS tablet Take 1 tablet (5 mg total) by mouth 2 (two) times daily.  60 tablet  11  . Ascorbic Acid (VITAMIN C) 100 MG tablet Take 500 mg by mouth daily.       Marland Kitchen b complex vitamins capsule Take 1 capsule by mouth daily.        . carvedilol (COREG) 3.125 MG tablet Take 3.125 mg by mouth 2 (two) times daily with a  meal.      . celecoxib (CELEBREX) 200 MG capsule Take 200 mg by mouth daily as needed for moderate pain.       . Cholecalciferol (VITAMIN D) 1000 UNITS capsule Take 1,000 Units by mouth daily.        . niacin 250 MG CR capsule Take 250 mg by mouth daily.        . Omega-3 Fatty Acids (FISH OIL PO) Take 2,400 mg by mouth daily.       . ramipril (ALTACE) 2.5 MG capsule TAKE 1 CAPSULE EVERY DAY.  90 capsule  0  . SUMAtriptan (IMITREX) 100 MG tablet Take 1 tablet (100 mg total) by mouth every 2 (two) hours as needed for migraine or headache. May repeat in 2 hours if headache persists or recurs.  10 tablet  11  . atorvastatin (LIPITOR) 10 MG tablet Take 1 tablet (10 mg total) by mouth  daily.  30 tablet  11   No current facility-administered medications for this visit.    Allergies  Allergen Reactions  . Oxycodone-Acetaminophen Nausea Only  . Propoxyphene N-Acetaminophen Nausea Only    History   Social History  . Marital Status: Single    Spouse Name: N/A    Number of Children: N/A  . Years of Education: N/A   Occupational History  . Not on file.   Social History Main Topics  . Smoking status: Former Research scientist (life sciences)  . Smokeless tobacco: Not on file  . Alcohol Use: No  . Drug Use: No  . Sexual Activity: Not on file   Other Topics Concern  . Not on file   Social History Narrative   Lives in [place].  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 Adopted: Yes    ROS- All systems are reviewed and negative except as per the HPI above  Physical Exam: Filed Vitals:   06/30/14 1353  BP: 140/84  Pulse: 69  Height: 6\' 2"  (1.88 m)  Weight: 93.26 kg (205 lb 9.6 oz)    GEN- The patient is well appearing, alert and oriented x 3 today.   Head- normocephalic, atraumatic Eyes-  Sclera clear, conjunctiva pink Ears- hearing intact Oropharynx- clear Neck- supple, no JVP Lymph- no cervical lymphadenopathy Lungs- Clear to ausculation bilaterally, normal work of breathing Heart- Regular rate and rhythm, no murmurs, rubs or gallops, PMI not laterally displaced GI- soft, NT, ND, + BS Extremities- no clubbing, cyanosis, or edema MS- no significant deformity or atrophy Skin- no rash or lesion Psych- euthymic mood, full affect Neuro- strength and sensation are intact  EKG today reveals sinus rhythm 69 bpm, LVH, QTC 439 ms.   Assessment and Plan:  1. afib and atrial flutter S/P PVI x almost 2 months. Still has some c/o fatigue and sob when attempting dose reduction of amiodarone, which clinically  does not correlate. Will obtain PFT's and refer to Pulmonary.  If after stopping amio,  call the office for symptoms, Ekg will will be obtained looking for PAF that might explain pts symptoms.  2. CAD/ ischemic CM/ H/o Bypass Symptoms ? Anginal equivalent Last stress test in January of this year. Pt states cath at first of year and showed CAD to be stable.  3. Chadsvasc  of  3 Continue Eliquis  3. Recheck in one month, sooner if stopping amiodarone causes increase in symptoms.    I have seen, examined the patient,  and reviewed the above assessment and plan with Roderic Palau NP.  Changes to above are made where necessary.  He appears to be doing well post ablation.  He is in sinus rhythm today.  I have encouraged him to stop amiodarone at this time.  He seems reluctant to do so.  He has SOB which he feels is worse when not taking amiodarone.  I cannot explain this.  I will order PFTs.  IF his PFTs are abnormal then we will refer to Pulmonary at that time.  I would like to see him again in 6 weeks.  The importance of compliance with anticoagulation was stressed again today.  Co Sign: Thompson Grayer, MD 06/30/2014 8:47 PM

## 2014-06-30 NOTE — Patient Instructions (Signed)
Your physician recommends that you schedule a follow-up appointment in: 8 weeks with Dr [name]  Your physician has recommended you make the following change in your medication:  1) Stop Amiodarone   Your physician has recommended that you have a pulmonary function test. Pulmonary Function Tests are a group of tests that measure how well air moves in and out of your lungs.

## 2014-07-01 ENCOUNTER — Ambulatory Visit (INDEPENDENT_AMBULATORY_CARE_PROVIDER_SITE_OTHER): Payer: Medicare Other | Admitting: Internal Medicine

## 2014-07-01 DIAGNOSIS — I4891 Unspecified atrial fibrillation: Secondary | ICD-10-CM

## 2014-07-01 LAB — PULMONARY FUNCTION TEST
DL/VA % pred: 59 %
DL/VA: 2.8 ml/min/mmHg/L
DLCO UNC: 17.83 ml/min/mmHg
DLCO unc % pred: 50 %
FEF 25-75 Post: 4.14 L/sec
FEF 25-75 Pre: 3.58 L/sec
FEF2575-%CHANGE-POST: 15 %
FEF2575-%Pred-Post: 143 %
FEF2575-%Pred-Pre: 123 %
FEV1-%CHANGE-POST: 4 %
FEV1-%PRED-PRE: 101 %
FEV1-%Pred-Post: 106 %
FEV1-POST: 3.91 L
FEV1-Pre: 3.74 L
FEV1FVC-%CHANGE-POST: 2 %
FEV1FVC-%Pred-Pre: 106 %
FEV6-%CHANGE-POST: 1 %
FEV6-%PRED-PRE: 99 %
FEV6-%Pred-Post: 101 %
FEV6-PRE: 4.67 L
FEV6-Post: 4.76 L
FEV6FVC-%Change-Post: 0 %
FEV6FVC-%PRED-PRE: 105 %
FEV6FVC-%Pred-Post: 105 %
FVC-%Change-Post: 1 %
FVC-%PRED-POST: 96 %
FVC-%Pred-Pre: 94 %
FVC-PRE: 4.68 L
FVC-Post: 4.76 L
POST FEV1/FVC RATIO: 82 %
Post FEV6/FVC ratio: 100 %
Pre FEV1/FVC ratio: 80 %
Pre FEV6/FVC Ratio: 100 %
RV % PRED: 52 %
RV: 1.29 L
TLC % PRED: 82 %
TLC: 6.1 L

## 2014-07-01 NOTE — Progress Notes (Signed)
PFT done today. 

## 2014-07-04 ENCOUNTER — Other Ambulatory Visit: Payer: Self-pay | Admitting: *Deleted

## 2014-07-04 DIAGNOSIS — R942 Abnormal results of pulmonary function studies: Secondary | ICD-10-CM

## 2014-08-15 ENCOUNTER — Other Ambulatory Visit: Payer: Self-pay

## 2014-08-20 DIAGNOSIS — H1840 Unspecified corneal degeneration: Secondary | ICD-10-CM | POA: Diagnosis not present

## 2014-08-29 ENCOUNTER — Encounter: Payer: Self-pay | Admitting: Internal Medicine

## 2014-08-29 ENCOUNTER — Ambulatory Visit (INDEPENDENT_AMBULATORY_CARE_PROVIDER_SITE_OTHER): Payer: Medicare Other | Admitting: Internal Medicine

## 2014-08-29 VITALS — BP 138/90 | HR 60 | Ht 73.5 in | Wt 201.1 lb

## 2014-08-29 DIAGNOSIS — I2510 Atherosclerotic heart disease of native coronary artery without angina pectoris: Secondary | ICD-10-CM | POA: Diagnosis not present

## 2014-08-29 DIAGNOSIS — I481 Persistent atrial fibrillation: Secondary | ICD-10-CM | POA: Diagnosis not present

## 2014-08-29 DIAGNOSIS — I25810 Atherosclerosis of coronary artery bypass graft(s) without angina pectoris: Secondary | ICD-10-CM | POA: Diagnosis not present

## 2014-08-29 DIAGNOSIS — R0600 Dyspnea, unspecified: Secondary | ICD-10-CM | POA: Diagnosis not present

## 2014-08-29 DIAGNOSIS — I4819 Other persistent atrial fibrillation: Secondary | ICD-10-CM

## 2014-08-29 NOTE — Patient Instructions (Signed)
Your physician recommends that you schedule a follow-up appointment in 3 months with Dr Allred    

## 2014-08-30 NOTE — Progress Notes (Signed)
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 male with a h/o paroxysmal atrial fibrillation and atrial flutter who presents today for follow-up post afib ablation 05/05/14.  He has had no afib post ablation.  He denies procedure related complications.  He has stopped amiodarone and actually has found that his breathing is better.  He recently had abnormal PFTs and is scheduled to see Dr Annamaria Boots in the near future to discuss these findings.  Today, he denies symptoms of  chest pain,  lower extremity edema, dizziness, presyncope, syncope, or neurologic sequela. The patient is tolerating medications without difficulties and is otherwise without complaint today.   Past Medical History  Diagnosis Date  . Hypertension     Borderline  . Degeneration of lumbar intervertebral disc   . GERD (gastroesophageal reflux disease)   . Ischemic heart disease     s/p MI, CABG 2000, s/p cath 2002 with 4/5 grafts patent (LIMA to LAD atretic and occluded mid vessel but adequate flow to distal LAD from collaterals / diagonal  . Low back pain   . Peyronie's disease   . PAC (premature atrial contraction)   . PAF (paroxysmal atrial fibrillation)     initially diagnosed during stress test 2002  . Coronary artery disease     CABG x5 in 2000; s/p cath 2002 > 4/5 grafts patent, LVEF 50%  . CHF (congestive heart failure)     LVEF at time of cath 2002 50%; now has been 35-40% since 2008 (patient has seen multiple cardiologists over the years including [facility], [place], [place] [facility], [facility] and Tollie Eth, MD)  . Atrial flutter   . GI bleed     duodenal ulcer   Past Surgical History  Procedure Laterality Date  . Cardiac catheterization  09/14/2001     Mildly decreased left ventricular systolic function --  Native three vessel coronary artery disease as described. -- Status post coronary artery bypass grafting  . Hernia repair   05/16/2001    Large left indirect inguinal hernia with right direct hernia.  Marland Kitchen Hernia repair      Recurrent left inguinal hernia -- Large left indirect inguinal hernia with right direct hernia.  . Coronary artery bypass graft  2000    LIMA to LAD, SVG to second diagonal, seq SVG to ramus and OM, SVG to PDA  . Ablation  05-06-2014    PVI and CTI ablation by Dr [name]  . Tee without cardioversion N/A 05/06/2014    Procedure: TRANSESOPHAGEAL ECHOCARDIOGRAM (TEE);  Surgeon: [name], MD;  Location: [facility] ENDOSCOPY;  Service: Cardiovascular;  Laterality: N/A;    Current Outpatient Prescriptions  Medication Sig Dispense Refill  . Ascorbic Acid (VITAMIN C) 100 MG tablet Take 500 mg by mouth daily.       Marland Kitchen b complex vitamins capsule Take 1 capsule by mouth daily.        . carvedilol (COREG) 3.125 MG tablet Take 3.125 mg by mouth 2 (two) times daily with a meal.      . celecoxib (CELEBREX) 200 MG capsule Take 200 mg by mouth daily as needed for moderate pain.       . Cholecalciferol (VITAMIN D) 1000 UNITS capsule Take 1,000 Units by mouth daily.        . niacin 250 MG CR capsule Take 250 mg by mouth daily.        . Omega-3 Fatty Acids (FISH OIL  PO) Take 2,400 mg by mouth daily.       . SUMAtriptan (IMITREX) 100 MG tablet Take 1 tablet (100 mg total) by mouth every 2 (two) hours as needed for migraine or headache. May repeat in 2 hours if headache persists or recurs.  10 tablet  11   No current facility-administered medications for this visit.    Allergies  Allergen Reactions  . Amiodarone Other (See Comments)    Left permanent scar on eye  . Oxycodone-Acetaminophen Nausea Only  . Propoxyphene N-Acetaminophen Nausea Only    History   Social History  . Marital Status: Single    Spouse Name: N/A    Number of Children: N/A  . Years of Education: N/A   Occupational History  . Not on file.   Social History Main Topics  . Smoking status: Former Research scientist (life sciences)  . Smokeless tobacco: Not on  file  . Alcohol Use: No  . Drug Use: No  . Sexual Activity: Not on file   Other Topics Concern  . Not on file   Social History Narrative   Lives in [place].  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 Adopted: Yes    ROS- All systems are reviewed and negative except as per the HPI above  Physical Exam: Filed Vitals:   08/29/14 1524  BP: 138/90  Pulse: 60  Height: 6' 1.5" (1.867 m)  Weight: 201 lb 1.9 oz (91.227 kg)    GEN- The patient is well appearing, alert and oriented x 3 today.   Head- normocephalic, atraumatic Eyes-  Sclera clear, conjunctiva pink Ears- hearing intact Oropharynx- clear Neck- supple, no JVP Lymph- no cervical lymphadenopathy Lungs- Clear to ausculation bilaterally, normal work of breathing Heart- Regular rate and rhythm, no murmurs, rubs or gallops, PMI not laterally displaced GI- soft, NT, ND, + BS Extremities- no clubbing, cyanosis, or edema MS- no significant deformity or atrophy Neuro- strength and sensation are intact  EKG today reveals sinus rhythm 60 bpm, LVH, QTC 432 ms. Septal infarct, inferior infarct unchanged from prior ekg  Assessment and Plan:  1. afib and atrial flutter Doing well post ablation without recurrence off of amiodarone. No changes today  2. CAD/ ischemic CM/ H/o Bypass Will follow clinically  3. Chadsvasc  of  3 Though he should continue anticoagulation, he is clear in his decision to stop anticoagulation.  4. SOB Improving off of amiodarone He is scheduled to follow-up with Dr Annamaria Boots for evaluation of abnormal PFTs next week  Return to see me in 3 months

## 2014-09-02 ENCOUNTER — Encounter: Payer: Self-pay | Admitting: Internal Medicine

## 2014-09-02 ENCOUNTER — Ambulatory Visit (INDEPENDENT_AMBULATORY_CARE_PROVIDER_SITE_OTHER): Payer: Medicare Other | Admitting: Internal Medicine

## 2014-09-02 VITALS — BP 118/64 | HR 64 | Ht 74.0 in | Wt 208.0 lb

## 2014-09-02 DIAGNOSIS — J300 Vasomotor rhinitis: Secondary | ICD-10-CM | POA: Diagnosis not present

## 2014-09-02 DIAGNOSIS — I480 Paroxysmal atrial fibrillation: Secondary | ICD-10-CM

## 2014-09-02 DIAGNOSIS — I2510 Atherosclerotic heart disease of native coronary artery without angina pectoris: Secondary | ICD-10-CM

## 2014-09-02 DIAGNOSIS — R0600 Dyspnea, unspecified: Secondary | ICD-10-CM

## 2014-09-02 NOTE — Patient Instructions (Signed)
Sample Spiriva Respimat  Inhale 2 puffs once daily  Sample Dymista nasal spray 1-2 puffs each nostril once daily at bedtime

## 2014-09-02 NOTE — Progress Notes (Signed)
09/02/14- 65 yoM former smoker referred courtesy of Dr [name] for c/o dyspnea which pt had felt was worse when off amiodarone, though recently in sinus rhythm.  PFT 07/01/14- normal spirometry with insignificant response to bronchodilator, normal lung volumes, moderately severe diffusion defect. TEE before ablation 04/2014- EF 40-45%, LAE. Quit smoking in 1986. Says reading difficulty comes and goes irregularly. He notices that he begins to breathe more deeply/rapidly, over the past year and a half. Onset is random, day or night and regardless of position. He can be sitting at rest. For many years he has felt that his ability to take a deep breath and his tendency to get short of breath with exercise were not quite up to normal. He feels "my windpipe is too small". Never easy to breathe through his nose. In the last month or 2 he thinks he has slowly gotten better. AFib diagnosed 2 years ago. CABG in 2000. Hx MI. Stuffy nose bothers his sleep. 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  Prior to Admission medications   Medication Sig Start Date End Date Taking? Authorizing Provider  Ascorbic Acid (VITAMIN C) 100 MG tablet Take 500 mg by mouth daily.    Yes Historical Provider, MD  b complex vitamins capsule Take 1 capsule by mouth daily.     Yes Historical Provider, MD  carvedilol (COREG) 3.125 MG tablet Take 3.125 mg by mouth 2 (two) times daily with a meal.   Yes Historical Provider, MD  celecoxib (CELEBREX) 200 MG capsule Take 200 mg by mouth daily as needed for moderate pain.    Yes Historical Provider, MD  Cholecalciferol (VITAMIN D) 1000 UNITS capsule Take 1,000 Units by mouth daily.     Yes Historical Provider, MD  mometasone (NASONEX) 50 MCG/ACT nasal spray Place 2 sprays into the nose daily.   Yes Historical Provider, MD  niacin 250 MG CR capsule Take 250 mg by mouth daily.     Yes Historical Provider, MD  Omega-3 Fatty Acids (FISH OIL PO)  Take 2,400 mg by mouth daily.    Yes Historical Provider, MD  SUMAtriptan (IMITREX) 100 MG tablet Take 1 tablet (100 mg total) by mouth every 2 (two) hours as needed for migraine or headache. May repeat in 2 hours if headache persists or recurs. 03/21/14  Yes [name], MD   Past Medical History  Diagnosis Date  . Hypertension     Borderline  . Degeneration of lumbar intervertebral disc   . GERD (gastroesophageal reflux disease)   . Ischemic heart disease     s/p MI, CABG 2000, s/p cath 2002 with 4/5 grafts patent (LIMA to LAD atretic and occluded mid vessel but adequate flow to distal LAD from collaterals / diagonal  . Low back pain   . Peyronie's disease   . PAC (premature atrial contraction)   . PAF (paroxysmal atrial fibrillation)     initially diagnosed during stress test 2002  . Coronary artery disease     CABG x5 in 2000; s/p cath 2002 > 4/5 grafts patent, LVEF 50%  . CHF (congestive heart failure)     LVEF at time of cath 2002 50%; now has been 35-40% since 2008 (patient has seen multiple cardiologists over the years including [facility], [place], [facility], [facility] and Tollie Eth, MD)  . Atrial flutter   . GI bleed     duodenal ulcer   Past Surgical History  Procedure Laterality  Date  . Cardiac catheterization  09/14/2001     Mildly decreased left ventricular systolic function --  Native three vessel coronary artery disease as described. -- Status post coronary artery bypass grafting  . Hernia repair  05/16/2001    Large left indirect inguinal hernia with right direct hernia.  Marland Kitchen Hernia repair      Recurrent left inguinal hernia -- Large left indirect inguinal hernia with right direct hernia.  . Coronary artery bypass graft  2000    LIMA to LAD, SVG to second diagonal, seq SVG to ramus and OM, SVG to PDA  . Ablation  05-06-2014    PVI and CTI ablation by Dr [name]  . Tee without cardioversion N/A 05/06/2014    Procedure: TRANSESOPHAGEAL  ECHOCARDIOGRAM (TEE);  Surgeon: [name], MD;  Location: [facility];  Service: Cardiovascular;  Laterality: N/A;   Family History  Problem Relation Age of Onset  . Adopted: Yes   History   Social History  . Marital Status: Single    Spouse Name: N/A    Number of Children: N/A  . Years of Education: N/A   Occupational History  . garage owner/corporate pilot    Social History Main Topics  . Smoking status: Former Smoker -- 1.00 packs/day for 20 years    Types: Cigarettes    Quit date: 10/31/1994  . Smokeless tobacco: Never Used  . Alcohol Use: No  . Drug Use: No  . Sexual Activity: Not on file   Other Topics Concern  . Not on file   Social History Narrative   Lives in [place].  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  Also flies airplanes   ROS-see HPI Constitutional:   No-   weight loss, night sweats, fevers, chills, fatigue, lassitude. HEENT:   + headaches, difficulty swallowing, tooth/dental problems, sore throat,       No-  sneezing, itching, ear ache,+nasal congestion, post nasal drip,  CV:  No-   chest pain, orthopnea, PND, swelling in lower extremities, anasarca,  dizziness, palpitations Resp: + shortness of breath with exertion or at rest.              No-   productive cough,  No non-productive cough,  No- coughing up of blood.              No-   change in color of mucus.  No- wheezing.   Skin: No-   rash or lesions. GI:  No-   heartburn, indigestion, abdominal pain, nausea, vomiting, diarrhea,                 change in bowel habits, loss of appetite GU: No-   dysuria, change in color of urine, no urgency or frequency.  No- flank pain. MS:  No-   joint pain or swelling.  No- decreased range of motion.  No- back pain. Neuro-     nothing unusual Psych:  No- change in mood or affect. No depression or anxiety.  No memory loss.  OBJ- Physical Exam General- Alert, Oriented, Affect-appropriate, Distress- none acute, + trim Skin- rash-none,  lesions- none, excoriation- none,  +Cool wet hands Lymphadenopathy- none Head- atraumatic            Eyes- Gross vision intact, PERRLA, conjunctivae and secretions clear            Ears- Hearing, canals-normal            Nose- Clear, no-Septal dev, mucus, polyps, erosion, perforation  Throat- Mallampati II , mucosa clear , drainage- none, tonsils- atrophic Neck- flexible , trachea midline, no stridor , thyroid nl, carotid no bruit Chest - symmetrical excursion , unlabored           Heart/CV- RRR , no murmur , no gallop  , no rub, nl s1 s2                           - JVD- none , edema- none, stasis changes- none, varices- none           Lung- clear to P&A, wheeze- none, cough- none , dullness-none, rub- none           Chest wall-  Abd- tender-no, distended-no, bowel sounds-present, HSM- no Br/ Gen/ Rectal- Not done, not indicated Extrem- cyanosis- none, clubbing, none, atrophy- none, strength- nl Neuro- grossly intact to observation

## 2014-09-07 NOTE — Assessment & Plan Note (Signed)
Nonspecific pattern without much sneezing or blowing. Plan-try sample Dymista nasal spray

## 2014-09-07 NOTE — Assessment & Plan Note (Signed)
Currently appears to be in sinus rhythm

## 2014-09-07 NOTE — Assessment & Plan Note (Addendum)
Normal spirometry flows and lung volumes with markedly reduced diffusion suggests heart failure, pulmonary vascular disease or anemia as for his dyspnea. Of these, his heart disease would best fit a pattern of intermittent dyspnea. He was not anemic in June. Plan- sample Spiriva for trial, consider methacholine challenge, consider V/Q scan Addendum- I realize at dictation that he has not had a recent CXR. This should be done for completeness.

## 2014-09-18 ENCOUNTER — Telehealth: Payer: Self-pay | Admitting: Internal Medicine

## 2014-09-18 ENCOUNTER — Telehealth: Payer: Self-pay | Admitting: *Deleted

## 2014-09-18 MED ORDER — AZELASTINE-FLUTICASONE 137-50 MCG/ACT NA SUSP: NASAL | Status: DC

## 2014-09-18 NOTE — Telephone Encounter (Signed)
Called and spoke with pt and he stated that the dymista is the best thing that has ever happened to him. Requested that the rx for the dymista be [facility].  This has been done and nothing further is needed.

## 2014-09-18 NOTE — Telephone Encounter (Signed)
Left msg on triage stating Dr. Annamaria Boots gave him sample of dymista. He would like rx sent to his pharmacy. Called pt back inform him will need to get rx from Dr. Annamaria Boots. Transferred him to pulmonology...Keith Vazquez

## 2014-09-19 ENCOUNTER — Ambulatory Visit: Payer: Medicare Other

## 2014-09-19 ENCOUNTER — Telehealth: Payer: Self-pay | Admitting: Internal Medicine

## 2014-09-19 MED ORDER — AZELASTINE-FLUTICASONE 137-50 MCG/ACT NA SUSP: NASAL | Status: DC

## 2014-09-19 NOTE — Telephone Encounter (Signed)
Pt stopped by and stated that he went to pick up his dymista from the pharmacy and they told him it would be a couple of days before this was in.  Sample has been left up front for the pt.  Pt will come back by to pick this up.

## 2014-09-22 ENCOUNTER — Other Ambulatory Visit: Payer: Self-pay | Admitting: *Deleted

## 2014-09-22 MED ORDER — FLUTICASONE PROPIONATE 50 MCG/ACT NA SUSP: 2.0000 | Freq: Every day | NASAL | Status: DC

## 2014-09-22 MED ORDER — AZELASTINE HCL 0.1 % NA SOLN: 2.0000 | Freq: Every day | NASAL | Status: DC

## 2014-10-02 DIAGNOSIS — M47816 Spondylosis without myelopathy or radiculopathy, lumbar region: Secondary | ICD-10-CM | POA: Diagnosis not present

## 2014-10-02 DIAGNOSIS — G894 Chronic pain syndrome: Secondary | ICD-10-CM | POA: Diagnosis not present

## 2014-10-09 ENCOUNTER — Encounter (HOSPITAL_COMMUNITY): Payer: Self-pay | Admitting: Cardiology

## 2014-10-21 ENCOUNTER — Telehealth: Payer: Self-pay | Admitting: Internal Medicine

## 2014-10-21 MED ORDER — AZELASTINE-FLUTICASONE 137-50 MCG/ACT NA SUSP: NASAL | Status: DC

## 2014-10-21 NOTE — Telephone Encounter (Signed)
Pt aware that Dymista sample is up front to be picked up.  Nothing further needed.

## 2014-11-04 ENCOUNTER — Ambulatory Visit: Payer: Medicare Other | Admitting: Internal Medicine

## 2014-11-21 ENCOUNTER — Ambulatory Visit: Payer: Medicare Other | Admitting: Internal Medicine

## 2014-11-24 ENCOUNTER — Ambulatory Visit (INDEPENDENT_AMBULATORY_CARE_PROVIDER_SITE_OTHER): Payer: Medicare Other | Admitting: Internal Medicine

## 2014-11-24 ENCOUNTER — Encounter: Payer: Self-pay | Admitting: Internal Medicine

## 2014-11-24 VITALS — BP 130/84 | HR 82 | Ht 74.0 in | Wt 209.6 lb

## 2014-11-24 DIAGNOSIS — J300 Vasomotor rhinitis: Secondary | ICD-10-CM

## 2014-11-24 DIAGNOSIS — J45990 Exercise induced bronchospasm: Secondary | ICD-10-CM | POA: Diagnosis not present

## 2014-11-24 DIAGNOSIS — R0600 Dyspnea, unspecified: Secondary | ICD-10-CM

## 2014-11-24 MED ORDER — AZELASTINE-FLUTICASONE 137-50 MCG/ACT NA SUSP: NASAL | Status: DC

## 2014-11-24 NOTE — Patient Instructions (Addendum)
Script for Dymista nasal spray  We will be happy to see you again as needed

## 2014-11-24 NOTE — Assessment & Plan Note (Signed)
We are not seeing response to bronchodilator by PFT or sample medications.

## 2014-11-24 NOTE — Progress Notes (Signed)
09/02/14- 58 yoM former smoker referred courtesy of Dr [name] for c/o dyspnea which pt had felt was worse when off amiodarone, though recently in sinus rhythm.  PFT 07/01/14- normal spirometry with insignificant response to bronchodilator, normal lung volumes, moderately severe diffusion defect. TEE before ablation 04/2014- EF 40-45%, LAE. Quit smoking in 1986. Says reading difficulty comes and goes irregularly. He notices that he begins to breathe more deeply/rapidly, over the past year and a half. Onset is random, day or night and regardless of position. He can be sitting at rest. For many years he has felt that his ability to take a deep breath and his tendency to get short of breath with exercise were not quite up to normal. He feels "my windpipe is too small". Never easy to breathe through his nose. In the last month or 2 he thinks he has slowly gotten better. AFib diagnosed 2 years ago. CABG in 2000. Hx MI. Stuffy nose bothers his sleep. 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  11/24/14-  18 yoM former smoker followedfor dyspnea which pt had felt was worse when off amiodarone, though recently in sinus rhythm FOLLOWS FOR: Could tell a bigger difference with Dymista-needs Rx with refills for 1 year; could not tell a difference with Spiriva Feels great. Working hard at gym. Breathes hard with exertion. Endurance improved/ Really likes Dymista "life changing"- helps air movement We discussed his PFT again showing no reversible obstructive disease  ROS-see HPI Constitutional:   No-   weight loss, night sweats, fevers, chills, fatigue, lassitude. HEENT:   + headaches, difficulty swallowing, tooth/dental problems, sore throat,       No-  sneezing, itching, ear ache,+nasal congestion, post nasal drip,  CV:  No-   chest pain, orthopnea, PND, swelling in lower extremities, anasarca,  dizziness, palpitations Resp: + shortness of breath with exertion  or at rest.              No-   productive cough,  No non-productive cough,  No- coughing up of blood.              No-   change in color of mucus.  No- wheezing.   Skin: No-   rash or lesions. GI:  No-   heartburn, indigestion, abdominal pain, nausea, vomiting,e GU:  MS:  No-   joint pain or swelling.  . Neuro-     nothing unusual Psych:  No- change in mood or affect. No depression or anxiety.  No memory loss.  OBJ- Physical Exam General- Alert, Oriented, Affect-appropriate, Distress- none acute,  Skin- rash-none, lesions- none, excoriation- none,  +Cool wet hands Lymphadenopathy- none Head- atraumatic            Eyes- Gross vision intact, PERRLA, conjunctivae and secretions clear            Ears- Hearing, canals-normal            Nose- Clear, no-Septal dev, mucus, polyps, erosion, perforation             Throat- Mallampati III , mucosa clear , drainage- none, tonsils- atrophic Neck- flexible , trachea midline, no stridor , thyroid nl, carotid no bruit Chest - symmetrical excursion , unlabored           Heart/CV- RRR , no murmur , no gallop  , no rub, nl s1 s2                           -  JVD- none , edema- none, stasis changes- none, varices- none           Lung- clear to P&A, wheeze- none, cough- none , dullness-none, rub- none           Chest wall-  Abd-  Br/ Gen/ Rectal- Not done, not indicated Extrem- cyanosis- none, clubbing, none, atrophy- none, strength- nl Neuro- grossly intact to observation

## 2014-11-24 NOTE — Assessment & Plan Note (Signed)
Reduced diffusion capacity is probably cardiac related. He feels his stamina is improving steadily as he exercises at the gym on a regular basis. He has not felt benefit from bronchodilators and PFT did not demonstrate reactive airways disease.

## 2014-11-24 NOTE — Assessment & Plan Note (Signed)
Nasal airflow is much more comfortable for him with Dymista and he feels that he breathes more easily with this

## 2014-12-16 ENCOUNTER — Encounter: Payer: Self-pay | Admitting: Internal Medicine

## 2014-12-26 ENCOUNTER — Other Ambulatory Visit: Payer: Self-pay | Admitting: Internal Medicine

## 2014-12-26 NOTE — Telephone Encounter (Signed)
If his bottle says 6.25mg  BID then that's what he is probably taking OK to refill 6.25mg  BID

## 2014-12-29 NOTE — Telephone Encounter (Signed)
Per note from 12/10/13 Call Documentation      Dionicio Stall, RN at 12/10/2013 12:25 PM     Status: Signed       Expand All Collapse All   Discussed with Dr Lovena Le will increase to Carvedilol to 6.25mg  twice daily

## 2015-01-12 ENCOUNTER — Telehealth: Payer: Self-pay | Admitting: Internal Medicine

## 2015-01-12 DIAGNOSIS — I480 Paroxysmal atrial fibrillation: Secondary | ICD-10-CM

## 2015-01-12 NOTE — Telephone Encounter (Signed)
New Msg       Pt calling.  Request call back, no details given.

## 2015-01-12 NOTE — Telephone Encounter (Signed)
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  He is going to call me back with what he needs.

## 2015-01-13 MED ORDER — LISINOPRIL 10 MG PO TABS: 10.0000 mg | ORAL_TABLET | Freq: Every day | ORAL | Status: DC

## 2015-01-13 NOTE — Telephone Encounter (Signed)
Order in and he will be called

## 2015-01-13 NOTE — Telephone Encounter (Signed)
Follow up      Pt said to tell you 24hrs.  He also said thanks for the birthday card

## 2015-01-16 ENCOUNTER — Encounter (INDEPENDENT_AMBULATORY_CARE_PROVIDER_SITE_OTHER): Payer: Medicare Other

## 2015-01-16 ENCOUNTER — Encounter: Payer: Self-pay | Admitting: Radiology

## 2015-01-16 DIAGNOSIS — I481 Persistent atrial fibrillation: Secondary | ICD-10-CM | POA: Diagnosis not present

## 2015-01-16 DIAGNOSIS — I480 Paroxysmal atrial fibrillation: Secondary | ICD-10-CM

## 2015-01-16 NOTE — Progress Notes (Signed)
Patient ID: Keith Vazquez, male   DOB: 08/05/1951, 64 y.o.   MRN: [id number] [employer] 24hr holter applied

## 2015-02-03 ENCOUNTER — Ambulatory Visit: Payer: Medicare Other | Admitting: Internal Medicine

## 2015-02-09 ENCOUNTER — Telehealth: Payer: Self-pay | Admitting: Internal Medicine

## 2015-02-09 DIAGNOSIS — I25810 Atherosclerosis of coronary artery bypass graft(s) without angina pectoris: Secondary | ICD-10-CM

## 2015-02-09 DIAGNOSIS — E785 Hyperlipidemia, unspecified: Secondary | ICD-10-CM

## 2015-02-09 NOTE — Telephone Encounter (Signed)
Spoke with patient and let him know his monitor results.  NSR with occasional PVC's but most importantly no afib.  He will now need a R/S Myoveiw and labs(lipids and BMP fasting).  I will place the order and hav eMelissa call and schedule the patient

## 2015-02-09 NOTE — Telephone Encounter (Signed)
New message     Want holter results

## 2015-02-18 ENCOUNTER — Ambulatory Visit (HOSPITAL_COMMUNITY): Payer: Medicare Other | Attending: Cardiovascular Disease | Admitting: Radiology

## 2015-02-18 ENCOUNTER — Other Ambulatory Visit (INDEPENDENT_AMBULATORY_CARE_PROVIDER_SITE_OTHER): Payer: Medicare Other | Admitting: *Deleted

## 2015-02-18 DIAGNOSIS — I25810 Atherosclerosis of coronary artery bypass graft(s) without angina pectoris: Secondary | ICD-10-CM

## 2015-02-18 DIAGNOSIS — E785 Hyperlipidemia, unspecified: Secondary | ICD-10-CM | POA: Diagnosis not present

## 2015-02-18 DIAGNOSIS — I2510 Atherosclerotic heart disease of native coronary artery without angina pectoris: Secondary | ICD-10-CM | POA: Insufficient documentation

## 2015-02-18 DIAGNOSIS — Z951 Presence of aortocoronary bypass graft: Secondary | ICD-10-CM | POA: Diagnosis not present

## 2015-02-18 DIAGNOSIS — I10 Essential (primary) hypertension: Secondary | ICD-10-CM | POA: Diagnosis not present

## 2015-02-18 LAB — BASIC METABOLIC PANEL
BUN: 16 mg/dL (ref 6–23)
CHLORIDE: 106 meq/L (ref 96–112)
CO2: 26 meq/L (ref 19–32)
CREATININE: 0.82 mg/dL (ref 0.40–1.50)
Calcium: 9.5 mg/dL (ref 8.4–10.5)
GFR: 99.88 mL/min (ref >60.00)
Glucose, Bld: 97 mg/dL (ref 70–99)
POTASSIUM: 3.7 meq/L (ref 3.5–5.1)
Sodium: 139 mEq/L (ref 135–145)

## 2015-02-18 LAB — LIPID PANEL
CHOL/HDL RATIO: 3
CHOLESTEROL: 104 mg/dL (ref 0–200)
HDL: 35.9 mg/dL — AB (ref >39.00)
LDL CALC: 39 mg/dL (ref 0–99)
NonHDL: 68.1
TRIGLYCERIDES: 146 mg/dL (ref 0.0–149.0)
VLDL: 29.2 mg/dL (ref 0.0–40.0)

## 2015-02-18 MED ORDER — TECHNETIUM TC 99M SESTAMIBI GENERIC - CARDIOLITE
11.0000 | Freq: Once | INTRAVENOUS | Status: AC | PRN
  Administered 2015-02-18: 11 via INTRAVENOUS

## 2015-02-18 MED ORDER — TECHNETIUM TC 99M SESTAMIBI GENERIC - CARDIOLITE
30.0000 | Freq: Once | INTRAVENOUS | Status: AC | PRN
  Administered 2015-02-18: 30 via INTRAVENOUS

## 2015-02-18 NOTE — Addendum Note (Signed)
Addended by: Eulis Foster on: 02/18/2015 08:55 AM   Modules accepted: Orders

## 2015-02-18 NOTE — Progress Notes (Signed)
[name] [address] [place], [place] [address] [phone]    Cardiology Nuclear Med Study  Keith Vazquez is a 64 y.o. male     MRN : [id number]     DOB: November 23, 1950  Procedure Date: 02/18/2015  Nuclear Med Background Indication for Stress Test:  Evaluation for Ischemia and FAA History:  CAD-CABG, '15 MPI: EF: 29%, ASTHMA Cardiac Risk Factors: Hypertension  Symptoms:  FAA clearance   Nuclear Pre-Procedure Caffeine/Decaff Intake:  None NPO After: 7:00pm   Lungs:  clear O2 Sat: 98% on room air. IV 0.9% NS with Angio Cath:  22g  IV Site: R Hand  IV Started by:  Crissie Figures, RN  Chest Size (in):  44 Cup Size: n/a  Height: 6\' 2"  (1.88 m)  Weight:  200 lb (90.719 kg)  BMI:  Body mass index is 25.67 kg/(m^2). Tech Comments:  N/A    Nuclear Med Study 1 or 2 day study: 1 day  Stress Test Type:  Stress  Reading MD: N/A  Order Authorizing Provider:  Thompson Grayer, MD  Resting Radionuclide: Technetium 44m Sestamibi  Resting Radionuclide Dose: 11.0 mCi   Stress Radionuclide:  Technetium 19m Sestamibi  Stress Radionuclide Dose: 33.0 mCi           Stress Protocol Rest HR: 80 Stress HR: 157  Rest BP: 139/76 Stress BP: 184/81  Exercise Time (min): 9:00 METS: 10.10   Predicted Max HR: 154 bpm % Max HR: 101.95 bpm Rate Pressure Product: [phone]   Dose of Adenosine (mg):  n/a Dose of Lexiscan: n/a mg  Dose of Atropine (mg): n/a Dose of Dobutamine: n/a mcg/kg/min (at max HR)  Stress Test Technologist: Perrin Maltese, EMT-P  Nuclear Technologist:  Earl Many, CNMT     Rest Procedure:  Myocardial perfusion imaging was performed at rest 45 minutes following the intravenous administration of Technetium 74m Sestamibi. Rest ECG: NSR - Normal EKG  Stress Procedure:  The patient exercised on the treadmill utilizing the Bruce Protocol for 9:00 minutes. The patient stopped due to total heart rate achieved and denied any chest pain.  Technetium 9m  Sestamibi was injected at peak exercise and myocardial perfusion imaging was performed after a brief delay. Stress ECG: No significant change from baseline ECG  QPS Raw Data Images:  Normal; no motion artifact; normal heart/lung ratio. Stress Images:  The LV is dilated.  There is a large severe inferiolateral defect.   Rest Images:  The LV is dilated.  There is a large severe inferiolateral defect.  Subtraction (SDS):  No evidence of ischemia.  There is a large inferirolateral scar   Transient Ischemic Dilatation (Normal <1.22):  0.98 Lung/Heart Ratio (Normal <0.45):  0.43  Quantitative Gated Spect Images QGS EDV:  161 ml QGS ESV:  102 ml  Impression Exercise Capacity:  Good exercise capacity. BP Response:  Normal blood pressure response. Clinical Symptoms:  No significant symptoms noted. ECG Impression:  No significant ST segment change suggestive of ischemia. Comparison with Prior Nuclear Study: No significant change from previous study from 11/20/13.    Overall Impression:  Low risk stress nuclear study .  There is evidence of a previous inferior lateral MI.   There is no ischemia. .  LV Ejection Fraction: 36 %.  LV Wall Motion:  NL LV Function; NL Wall Motion or .  There is severe hypokinesis of the inferior lateral walls.     Thayer Headings, Brooke Bonito., MD, [facility] 02/18/2015, 4:30 PM  [place]. [address],  [place] Pager [phone]

## 2015-02-19 ENCOUNTER — Other Ambulatory Visit: Payer: Self-pay | Admitting: *Deleted

## 2015-02-19 DIAGNOSIS — I519 Heart disease, unspecified: Secondary | ICD-10-CM

## 2015-02-19 DIAGNOSIS — I480 Paroxysmal atrial fibrillation: Secondary | ICD-10-CM

## 2015-02-24 ENCOUNTER — Encounter: Payer: Self-pay | Admitting: Internal Medicine

## 2015-02-24 ENCOUNTER — Other Ambulatory Visit (INDEPENDENT_AMBULATORY_CARE_PROVIDER_SITE_OTHER): Payer: Medicare Other

## 2015-02-24 ENCOUNTER — Ambulatory Visit (HOSPITAL_COMMUNITY): Payer: Medicare Other | Attending: Cardiology | Admitting: Cardiology

## 2015-02-24 ENCOUNTER — Ambulatory Visit (INDEPENDENT_AMBULATORY_CARE_PROVIDER_SITE_OTHER): Payer: Medicare Other | Admitting: Internal Medicine

## 2015-02-24 VITALS — BP 108/70 | HR 75 | Temp 98.5°F | Resp 16 | Ht 74.0 in | Wt 202.0 lb

## 2015-02-24 DIAGNOSIS — I25810 Atherosclerosis of coronary artery bypass graft(s) without angina pectoris: Secondary | ICD-10-CM

## 2015-02-24 DIAGNOSIS — I253 Aneurysm of heart: Secondary | ICD-10-CM | POA: Diagnosis not present

## 2015-02-24 DIAGNOSIS — N400 Enlarged prostate without lower urinary tract symptoms: Secondary | ICD-10-CM

## 2015-02-24 DIAGNOSIS — I519 Heart disease, unspecified: Secondary | ICD-10-CM | POA: Diagnosis not present

## 2015-02-24 DIAGNOSIS — G5603 Carpal tunnel syndrome, bilateral upper limbs: Secondary | ICD-10-CM

## 2015-02-24 DIAGNOSIS — G5601 Carpal tunnel syndrome, right upper limb: Secondary | ICD-10-CM | POA: Diagnosis not present

## 2015-02-24 DIAGNOSIS — G5602 Carpal tunnel syndrome, left upper limb: Secondary | ICD-10-CM

## 2015-02-24 DIAGNOSIS — B351 Tinea unguium: Secondary | ICD-10-CM

## 2015-02-24 DIAGNOSIS — I480 Paroxysmal atrial fibrillation: Secondary | ICD-10-CM | POA: Diagnosis not present

## 2015-02-24 DIAGNOSIS — K648 Other hemorrhoids: Secondary | ICD-10-CM

## 2015-02-24 DIAGNOSIS — I10 Essential (primary) hypertension: Secondary | ICD-10-CM

## 2015-02-24 LAB — PSA: PSA: 0.71 ng/mL (ref 0.10–4.00)

## 2015-02-24 NOTE — Progress Notes (Signed)
Pre visit review using our clinic review tool, if applicable. No additional management support is needed unless otherwise documented below in the visit note. 

## 2015-02-24 NOTE — Patient Instructions (Signed)
Carpal Tunnel Syndrome The carpal tunnel is a narrow area located on the palm side of your wrist. The tunnel is formed by the wrist bones and ligaments. Nerves, blood vessels, and tendons pass through the carpal tunnel. Repeated wrist motion or certain diseases may cause swelling within the tunnel. This swelling pinches the main nerve in the wrist (median nerve) and causes the painful hand and arm condition called carpal tunnel syndrome. CAUSES   Repeated wrist motions.  Wrist injuries.  Certain diseases like arthritis, diabetes, alcoholism, hyperthyroidism, and kidney failure.  Obesity.  Pregnancy. SYMPTOMS   A "pins and needles" feeling in your fingers or hand, especially in your thumb, index and middle fingers.  Tingling or numbness in your fingers or hand.  An aching feeling in your entire arm, especially when your wrist and elbow are bent for long periods of time.  Wrist pain that goes up your arm to your shoulder.  Pain that goes down into your palm or fingers.  A weak feeling in your hands. DIAGNOSIS  Your health care provider will take your history and perform a physical exam. An electromyography test may be needed. This test measures electrical signals sent out by your nerves into the muscles. The electrical signals are usually slowed by carpal tunnel syndrome. You may also need X-rays. TREATMENT  Carpal tunnel syndrome may clear up by itself. Your health care provider may recommend a wrist splint or medicine such as a nonsteroidal anti-inflammatory medicine. Cortisone injections may help. Sometimes, surgery may be needed to free the pinched nerve.  HOME CARE INSTRUCTIONS   Take all medicine as directed by your health care provider. Only take over-the-counter or prescription medicines for pain, discomfort, or fever as directed by your health care provider.  If you were given a splint to keep your wrist from bending, wear it as directed. It is important to wear the splint at  night. Wear the splint for as long as you have pain or numbness in your hand, arm, or wrist. This may take 1 to 2 months.  Rest your wrist from any activity that may be causing your pain. If your symptoms are work-related, you may need to talk to your employer about changing to a job that does not require using your wrist.  Put ice on your wrist after long periods of wrist activity.  Put ice in a plastic bag.  Place a towel between your skin and the bag.  Leave the ice on for 15-20 minutes, 03-04 times a day.  Keep all follow-up visits as directed by your health care provider. This includes any orthopedic referrals, physical therapy, and rehabilitation. Any delay in getting necessary care could result in a delay or failure of your condition to heal. SEEK IMMEDIATE MEDICAL CARE IF:   You have new, unexplained symptoms.  Your symptoms get worse and are not helped or controlled with medicines. MAKE SURE YOU:   Understand these instructions.  Will watch your condition.  Will get help right away if you are not doing well or get worse. Document Released: 10/14/2000 Document Revised: 03/03/2014 Document Reviewed: 09/02/2011 Exit[employer] Patient Information 2015 [employer]. This information is not intended to replace advice given to you by your health care provider. Make sure you discuss any questions you have with your health care provider.  

## 2015-02-24 NOTE — Progress Notes (Signed)
Echo performed. 

## 2015-02-24 NOTE — Progress Notes (Signed)
Subjective:    Patient ID: Keith Vazquez, male    DOB: [birth year], 64 y.o.   MRN: [id number]  Hypertension This is a chronic problem. The current episode started more than 1 year ago. The problem is unchanged. The problem is controlled. Pertinent negatives include no anxiety, blurred vision, chest pain, headaches, malaise/fatigue, neck pain, orthopnea, palpitations, peripheral edema, PND, shortness of breath or sweats. Past treatments include beta blockers and ACE inhibitors. The current treatment provides significant improvement. There are no compliance problems.       Review of Systems  Constitutional: Negative.  Negative for fever, chills, malaise/fatigue, diaphoresis, appetite change and fatigue.  HENT: Negative.   Eyes: Negative.  Negative for blurred vision.  Respiratory: Negative.  Negative for cough, choking, chest tightness, shortness of breath and stridor.   Cardiovascular: Negative.  Negative for chest pain, palpitations, orthopnea, leg swelling and PND.  Gastrointestinal: Positive for anal bleeding. Negative for nausea, vomiting, abdominal pain, diarrhea, constipation, blood in stool, abdominal distention and rectal pain.       He complains of hemorrhoids  Endocrine: Negative.   Genitourinary: Negative.   Musculoskeletal: Positive for arthralgias. Negative for myalgias, back pain, joint swelling, gait problem, neck pain and neck stiffness.       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  Skin: Negative.  Negative for color change and rash.       He is concerned about his right great toenail, over the last year it has become discolored after he injured it, he tells me that he saw ? Dermatologist and treated with a topical application of antifungal with no improvement  Allergic/Immunologic: Negative.   Neurological: Negative.  Negative for  dizziness, tremors, seizures, syncope, facial asymmetry, speech difficulty, weakness, light-headedness, numbness and headaches.       His last headache was three weeks ago  Hematological: Negative.  Negative for adenopathy. Does not bruise/bleed easily.  Psychiatric/Behavioral: Negative.        Objective:   Physical Exam  Constitutional: He is oriented to person, place, and time. He appears well-developed and well-nourished. No distress.  HENT:  Head: Normocephalic and atraumatic.  Mouth/Throat: Oropharynx is clear and moist. No oropharyngeal exudate.  Eyes: Conjunctivae are normal. Right eye exhibits no discharge. Left eye exhibits no discharge. No scleral icterus.  Neck: Normal range of motion. Neck supple. No JVD present. No tracheal deviation present. No thyromegaly present.  Cardiovascular: Normal rate, regular rhythm, normal heart sounds and intact distal pulses.  Exam reveals no gallop and no friction rub.   No murmur heard. Pulmonary/Chest: Effort normal and breath sounds normal. No stridor. No respiratory distress. He has no wheezes. He has no rales. He exhibits no tenderness.  Abdominal: Soft. Bowel sounds are normal. He exhibits no distension and no mass. There is no tenderness. There is no rebound and no guarding.  Musculoskeletal: Normal range of motion. He exhibits no edema or tenderness.  Lymphadenopathy:    He has no cervical adenopathy.  Neurological: He is oriented to person, place, and time.  Skin: Skin is warm and dry. No rash noted. He is not diaphoretic. No erythema. No pallor.  Rt great toenail is thick and has some subungual debris  Vitals reviewed.    Lab Results  Component Value Date   WBC 8.6 04/29/2014   HGB 15.8 04/29/2014   HCT  47.3 04/29/2014   PLT 170.0 04/29/2014   GLUCOSE 97 02/18/2015   CHOL 104 02/18/2015   TRIG 146.0 02/18/2015   HDL 35.90* 02/18/2015   LDLDIRECT 120.5 09/30/2013   LDLCALC 39 02/18/2015   ALT 11 12/27/2013   AST 24  12/27/2013   NA 139 02/18/2015   K 3.7 02/18/2015   CL 106 02/18/2015   CREATININE 0.82 02/18/2015   BUN 16 02/18/2015   CO2 26 02/18/2015   TSH 1.81 12/27/2013   INR 1.00 11/26/2013       Assessment & Plan:

## 2015-02-25 ENCOUNTER — Ambulatory Visit (HOSPITAL_COMMUNITY): Payer: Medicare Other | Admitting: Nurse Practitioner

## 2015-02-25 NOTE — Assessment & Plan Note (Signed)
Podiatry referral

## 2015-02-25 NOTE — Assessment & Plan Note (Signed)
GI referral to consider colonoscopy and procedure for the hemorrhoids

## 2015-02-25 NOTE — Assessment & Plan Note (Signed)
His BP is well controlled 

## 2015-02-25 NOTE — Assessment & Plan Note (Signed)
Sports med referral?

## 2015-02-26 ENCOUNTER — Encounter: Payer: Self-pay | Admitting: Internal Medicine

## 2015-02-26 ENCOUNTER — Ambulatory Visit (INDEPENDENT_AMBULATORY_CARE_PROVIDER_SITE_OTHER): Payer: Medicare Other | Admitting: Internal Medicine

## 2015-02-26 VITALS — BP 100/66 | HR 76 | Ht 73.0 in | Wt 202.2 lb

## 2015-02-26 DIAGNOSIS — K625 Hemorrhage of anus and rectum: Secondary | ICD-10-CM

## 2015-02-26 DIAGNOSIS — K6289 Other specified diseases of anus and rectum: Secondary | ICD-10-CM | POA: Diagnosis not present

## 2015-02-26 DIAGNOSIS — Z1211 Encounter for screening for malignant neoplasm of colon: Secondary | ICD-10-CM | POA: Diagnosis not present

## 2015-02-26 DIAGNOSIS — I255 Ischemic cardiomyopathy: Secondary | ICD-10-CM

## 2015-02-26 DIAGNOSIS — Z8711 Personal history of peptic ulcer disease: Secondary | ICD-10-CM | POA: Diagnosis not present

## 2015-02-26 MED ORDER — NA SULFATE-K SULFATE-MG SULF 17.5-3.13-1.6 GM/177ML PO SOLN: 1.0000 | Freq: Once | ORAL | Status: DC

## 2015-02-26 NOTE — Progress Notes (Signed)
HISTORY OF PRESENT ILLNESS:  Keith Vazquez is a 64 y.o. male with multiple medical problems including hypertension, ischemic cardiomyopathy (EF 40-45%), history of atrial arrhythmia status post ablation procedure, prior CABG, peptic ulcer disease, and GERD. He presents today with several GI complaints. The patient was last seen in this office June 2009 for constipation, GERD, and history of ulcer disease. Colonoscopy was recommended for screening purposes. He did not follow through. Complaints today include a six-month history of intermittent rectal bleeding, rectal discomfort (is possibly hemorrhoids), and increased intestinal gas. Patient states the bleeding is intermittent and unpredictable. Levels are modest. Family history is unknown as patient is adopted. As in 2009, he appears agitated and had taken his to through different portions of the evaluation. He does tell me that he has Medicare and is interested in having colonoscopy at this time.  REVIEW OF SYSTEMS:  All non-GI ROS negative except for  Past Medical History  Diagnosis Date  . Hypertension     Borderline  . Degeneration of lumbar intervertebral disc   . GERD (gastroesophageal reflux disease)   . Ischemic heart disease     s/p MI, CABG 2000, s/p cath 2002 with 4/5 grafts patent (LIMA to LAD atretic and occluded mid vessel but adequate flow to distal LAD from collaterals / diagonal  . Low back pain   . Peyronie's disease   . PAC (premature atrial contraction)   . PAF (paroxysmal atrial fibrillation)     initially diagnosed during stress test 2002  . Coronary artery disease     CABG x5 in 2000; s/p cath 2002 > 4/5 grafts patent, LVEF 50%  . CHF (congestive heart failure)     LVEF at time of cath 2002 50%; now has been 35-40% since 2008 (patient has seen multiple cardiologists over the years including [facility] [address], [place], [facility], [facility] and Tollie Eth, MD)  . Atrial flutter   . GI bleed      duodenal ulcer    Past Surgical History  Procedure Laterality Date  . Cardiac catheterization  09/14/2001     Mildly decreased left ventricular systolic function --  Native three vessel coronary artery disease as described. -- Status post coronary artery bypass grafting  . Hernia repair  05/16/2001    Large left indirect inguinal hernia with right direct hernia.  Marland Kitchen Hernia repair      Recurrent left inguinal hernia -- Large left indirect inguinal hernia with right direct hernia.  . Coronary artery bypass graft  2000    LIMA to LAD, SVG to second diagonal, seq SVG to ramus and OM, SVG to PDA  . Ablation  05-06-2014    PVI and CTI ablation by Dr [name]  . Tee without cardioversion N/A 05/06/2014    Procedure: TRANSESOPHAGEAL ECHOCARDIOGRAM (TEE);  Surgeon: [name], MD;  Location: [place];  Service: Cardiovascular;  Laterality: N/A;  . Left heart catheterization with coronary/graft angiogram  11/26/2013    Procedure: LEFT HEART CATHETERIZATION WITH [name];  Surgeon: Peter M Martinique, MD;  Location: [facility];  Service: Cardiovascular;;  . Atrial fibrillation ablation N/A 05/06/2014    Procedure: ATRIAL FIBRILLATION ABLATION;  Surgeon: [name], MD;  Location: [place] CATH LAB;  Service: Cardiovascular;  Laterality: N/A;    Social History Keith Vazquez  reports that he quit smoking about 20 years ago. His smoking use included Cigarettes. He has a 20 pack-year smoking history. He has never used smokeless tobacco. He reports  that he does not drink alcohol or use illicit drugs.  family history is not on file. He was adopted.  Allergies  Allergen Reactions  . Amiodarone Other (See Comments)    Left permanent scar on eye  . Oxycodone-Acetaminophen Nausea Only  . Propoxyphene N-Acetaminophen Nausea Only       PHYSICAL EXAMINATION: Vital signs: BP 100/66 mmHg  Pulse 76  Ht 6\' 1"  (1.854 m)  Wt 202 lb 4 oz (91.74 kg)  BMI 26.69 kg/m2  Constitutional:  generally well-appearing, no acute distress Psychiatric: alert and oriented x3, cooperative Eyes: extraocular movements intact, anicteric, conjunctiva pink Mouth: oral pharynx moist, no lesions Neck: supple no lymphadenopathy Cardiovascular: heart regular rate and rhythm, no murmur Lungs: clear to auscultation bilaterally Abdomen: soft, nontender, nondistended, no obvious ascites, no peritoneal signs, normal bowel sounds, no organomegaly Rectal: Deferred until colonoscopy Extremities: no lower extremity edema bilaterally Skin: no lesions on visible extremities Neuro: No focal deficits.   ASSESSMENT:  #1. 6 month history of intermittent rectal bleeding and rectal discomfort. May have fissure. Rule out neoplasia or hemorrhoids #2. History of GERD. Not on PPI #3. History of complicated bleeding ulcer requiring endoscopic hemostatic therapy in 1997. Patient is currently taking Celebrex but no PPI #4. Multiple significant medical problems including ischemic cardiomyopathy   PLAN:  #1. Colonoscopy to evaluate rectal bleeding and provide colon cancer screening. Patient is high-risk given his cardiac issues.The nature of the procedure, as well as the risks, benefits, and alternatives were carefully and thoroughly reviewed with the patient. Ample time for discussion and questions allowed. The patient understood, was satisfied, and agreed to proceed. #2. Recommend daily PPI given history of, complicated ulcer disease and current NSAID use

## 2015-02-26 NOTE — Patient Instructions (Signed)
You have been scheduled for a colonoscopy. Please follow written instructions given to you at your visit today.  You have been given a sample of the prep. If you use inhalers (even only as needed), please bring them with you on the day of your procedure.

## 2015-02-27 ENCOUNTER — Encounter: Payer: Self-pay | Admitting: Internal Medicine

## 2015-02-27 ENCOUNTER — Encounter: Payer: Self-pay | Admitting: Family Medicine

## 2015-02-27 ENCOUNTER — Ambulatory Visit (INDEPENDENT_AMBULATORY_CARE_PROVIDER_SITE_OTHER): Payer: Medicare Other

## 2015-02-27 ENCOUNTER — Ambulatory Visit (INDEPENDENT_AMBULATORY_CARE_PROVIDER_SITE_OTHER): Payer: Medicare Other | Admitting: Internal Medicine

## 2015-02-27 ENCOUNTER — Ambulatory Visit (INDEPENDENT_AMBULATORY_CARE_PROVIDER_SITE_OTHER): Payer: Medicare Other | Admitting: Family Medicine

## 2015-02-27 VITALS — BP 106/72 | HR 75 | Ht 73.5 in | Wt 203.6 lb

## 2015-02-27 VITALS — BP 122/80 | HR 74 | Ht 73.0 in | Wt 201.0 lb

## 2015-02-27 DIAGNOSIS — M79642 Pain in left hand: Secondary | ICD-10-CM

## 2015-02-27 DIAGNOSIS — I5022 Chronic systolic (congestive) heart failure: Secondary | ICD-10-CM

## 2015-02-27 DIAGNOSIS — I10 Essential (primary) hypertension: Secondary | ICD-10-CM

## 2015-02-27 DIAGNOSIS — M79641 Pain in right hand: Secondary | ICD-10-CM

## 2015-02-27 DIAGNOSIS — I2583 Coronary atherosclerosis due to lipid rich plaque: Secondary | ICD-10-CM

## 2015-02-27 DIAGNOSIS — M65831 Other synovitis and tenosynovitis, right forearm: Secondary | ICD-10-CM

## 2015-02-27 DIAGNOSIS — M65842 Other synovitis and tenosynovitis, left hand: Secondary | ICD-10-CM

## 2015-02-27 DIAGNOSIS — I480 Paroxysmal atrial fibrillation: Secondary | ICD-10-CM

## 2015-02-27 DIAGNOSIS — I2510 Atherosclerotic heart disease of native coronary artery without angina pectoris: Secondary | ICD-10-CM

## 2015-02-27 DIAGNOSIS — I255 Ischemic cardiomyopathy: Secondary | ICD-10-CM

## 2015-02-27 DIAGNOSIS — M65832 Other synovitis and tenosynovitis, left forearm: Secondary | ICD-10-CM

## 2015-02-27 DIAGNOSIS — M65841 Other synovitis and tenosynovitis, right hand: Secondary | ICD-10-CM

## 2015-02-27 NOTE — Assessment & Plan Note (Signed)
His blood pressure remains well-controlled. We discussed up titration of his beta blocker, and he will try increasing to 9.375 mg twice a day

## 2015-02-27 NOTE — Progress Notes (Signed)
Pre visit review using our clinic review tool, if applicable. No additional management support is needed unless otherwise documented below in the visit note. 

## 2015-02-27 NOTE — Patient Instructions (Signed)
Medication Instructions:  Your physician recommends that you continue on your current medications as directed. Please refer to the Current Medication list given to you today.   Labwork: NONE  Testing/Procedures: NONE  Follow-Up: Your physician wants you to follow-up in: 12 months with Dr. Taylor. You will receive a reminder letter in the mail two months in advance. If you don't receive a letter, please call our office to schedule the follow-up appointment.   Any Other Special Instructions Will Be Listed Below (If Applicable).   

## 2015-02-27 NOTE — Assessment & Plan Note (Signed)
He is asymptomatic and denies anginal symptoms. He is encouraged to continue his regular daily physical activity.

## 2015-02-27 NOTE — Assessment & Plan Note (Addendum)
He appears to be maintaining sinus rhythm , status post catheter ablation of atrial fibrillation. He is off amiodarone. He has no symptoms. He has worn a cardiac monitor which demonstrates no recurrent atrial fibrillation.  He is not on systemic anticoagulation at this point.

## 2015-02-27 NOTE — Patient Instructions (Signed)
Good to see you Ice 20 minutes 2 times daily. Usually after activity and before bed. Exercises 3 times a week.  Wear brace day and night for 2 weeks then nightly for 2 weeks.  Try pennsaid twice daily as needed for pain Vitamin D 2000 IU daily Turmeric 500mg  daily Diet higher in omega 3 and lower in omega 6.  See me again in 3 weeks.

## 2015-02-27 NOTE — Assessment & Plan Note (Signed)
Patient's pain continues to have intersection syndrome of the wrist. I think that this is was given him is concerned and I do not find any findings of a carpal tunnel and patient's symptoms do not correspond with the pathology. Patient will do a bracing, topical anti-inflammatory's, icing and home exercises which patient learned from athletic trainer today. Patient and will follow-up in 3 weeks for further evaluation and treatment.

## 2015-02-27 NOTE — Progress Notes (Signed)
[name] [address] [place], [place] [address] Phone: [phone] Subjective:    I'm seeing this patient by the request  of:    CC: Bilateral wrist pain  ELF:[id number] Keith Vazquez is a 64 y.o. male coming in with complaint of bilateral wrist pain. Patient states right greater than left. 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 Patient describes it as a dull throbbing aching sensation that is worse with increasing activity. Patient denies any numbness or any weakness. Patient states that with extension of his wrist for long amount of time can give him a severe pain. Patient rates this pain is 8 out of 10. Past Medical History  Diagnosis Date  . Hypertension     Borderline  . Degeneration of lumbar intervertebral disc   . GERD (gastroesophageal reflux disease)   . Ischemic heart disease     s/p MI, CABG 2000, s/p cath 2002 with 4/5 grafts patent (LIMA to LAD atretic and occluded mid vessel but adequate flow to distal LAD from collaterals / diagonal  . Low back pain   . Peyronie's disease   . PAC (premature atrial contraction)   . PAF (paroxysmal atrial fibrillation)     initially diagnosed during stress test 2002  . Coronary artery disease     CABG x5 in 2000; s/p cath 2002 > 4/5 grafts patent, LVEF 50%  . CHF (congestive heart failure)     LVEF at time of cath 2002 50%; now has been 35-40% since 2008 (patient has seen multiple cardiologists over the years including [facility], [place], [facility], [facility] and Tollie Eth, MD)  . Atrial flutter   . GI bleed     duodenal ulcer   Past Surgical History  Procedure Laterality Date  . Cardiac catheterization  09/14/2001     Mildly decreased left ventricular systolic function --  Native three vessel coronary artery disease as described. -- Status post coronary artery bypass grafting  . Hernia repair  05/16/2001    Large left indirect inguinal  hernia with right direct hernia.  Marland Kitchen Hernia repair      Recurrent left inguinal hernia -- Large left indirect inguinal hernia with right direct hernia.  . Coronary artery bypass graft  2000    LIMA to LAD, SVG to second diagonal, seq SVG to ramus and OM, SVG to PDA  . Ablation  05-06-2014    PVI and CTI ablation by Dr [name]  . Tee without cardioversion N/A 05/06/2014    Procedure: TRANSESOPHAGEAL ECHOCARDIOGRAM (TEE);  Surgeon: [name], MD;  Location: [facility];  Service: Cardiovascular;  Laterality: N/A;  . Left heart catheterization with coronary/graft angiogram  11/26/2013    Procedure: LEFT HEART CATHETERIZATION WITH [name];  Surgeon: Peter M Martinique, MD;  Location: [facility] CATH LAB;  Service: Cardiovascular;;  . Atrial fibrillation ablation N/A 05/06/2014    Procedure: ATRIAL FIBRILLATION ABLATION;  Surgeon: [name], MD;  Location: [facility] CATH LAB;  Service: Cardiovascular;  Laterality: N/A;   Allergies  Allergen Reactions  . Amiodarone Other (See Comments)    Left permanent scar on eye  . Oxycodone-Acetaminophen Nausea Only  . Propoxyphene N-Acetaminophen Nausea Only   Family History  Problem Relation Age of Onset  . Adopted: Yes   History  Substance Use Topics  . Smoking status: Former Smoker -- 1.00 packs/day for 20 years    Types: Cigarettes    Quit date: 10/31/1994  .  Smokeless tobacco: Never Used  . Alcohol Use: No       Past medical history, social, surgical and family history all reviewed in electronic medical record.   Review of Systems: No headache, visual changes, nausea, vomiting, diarrhea, constipation, dizziness, abdominal pain, skin rash, fevers, chills, night sweats, weight loss, swollen lymph nodes, body aches, joint swelling, muscle aches, chest pain, shortness of breath, mood changes.   Objective Blood pressure 122/80, pulse 74, height 6\' 1"  (1.854 m), weight 201 lb (91.173 kg), SpO2 98 %.  General: No apparent distress alert and  oriented x3 mood and affect normal, dressed appropriately.  HEENT: Pupils equal, extraocular movements intact  Respiratory: Patient's speak in full sentences and does not appear short of breath  Cardiovascular: No lower extremity edema, non tender, no erythema  Skin: Warm dry intact with no signs of infection or rash on extremities or on axial skeleton.  Abdomen: Soft nontender  Neuro: Cranial nerves II through XII are intact, neurovascularly intact in all extremities with 2+ DTRs and 2+ pulses.  Lymph: No lymphadenopathy of posterior or anterior cervical chain or axillae bilaterally.  Gait normal with good balance and coordination.  MSK:  Non tender with full range of motion and good stability and symmetric strength and tone of shoulders, elbows,  hip, knee and ankles bilaterally.  Wrist:right Inspection normal with no visible erythema or swelling. ROM smooth and normal with good flexion and extension and ulnar/radial deviation that is symmetrical with opposite wrist. Palpation is normal over metacarpals, navicular, and TFCC; tendons without tenderness/ swelling, mild pain over the lunate with extension but no palpable pop No snuffbox tenderness. No tenderness over [place]. Strength 5/5 in all directions without pain. Mild positive Finkelstein, negative tinel's and phalens. Negative Watson's test. Contralateral wrist minor discomfort or edema full extension of the wrist but otherwise unremarkable.  MSK US performed of: Right wrist This study was ordered, performed, and interpreted by Charlann Boxer D.O.  Wrist: All extensor compartments first and second compartment show significant hypoechoic changes. TFCC intact. Scapholunate ligament intact. Carpal tunnel visualized and median nerve area normal, flexor tendons all normal in appearance without fraying, tears, or sheath effusions. Power doppler signal normal.  IMPRESSION:  Intersection syndrome   [address]; 15 minutes spent for  Therapeutic exercises as stated in above notes.  This included exercises focusing on stretching, strengthening, with significant focus on eccentric aspects.  Wrist extension exercises as well as supination and pronation exercises given. Patient also will do more of a squeezing exercises to strengthen the surrounding muscles. Proper technique shown and discussed handout in great detail with ATC.  All questions were discussed and answered.      Impression and Recommendations:     This case required medical decision making of moderate complexity.

## 2015-02-27 NOTE — Assessment & Plan Note (Addendum)
His ejection fraction has improved from 35% , to 45%. He will continue his beta blocker therapy. His symptoms are currently class I.  His activity is unlimited. At this point, he has very low likelihood of sudden incapacitation. 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

## 2015-02-27 NOTE — Progress Notes (Signed)
HPI [name] returns today for followup. He is a pleasant 64 yo man with an ICM, chronic systolic CHF, paroxysmal atrial fibrillation who was placed on amiodarone several months ago. Has undergone atrial fibrillation ablation in the interim. In contrast, he notes that his ability to exercise is much improved with initiation of medical therapy. He denies peripheral edema. He is exercising regularly.  Allergies  Allergen Reactions  . Amiodarone Other (See Comments)    Left permanent scar on eye  . Oxycodone-Acetaminophen Nausea Only  . Propoxyphene N-Acetaminophen Nausea Only     Current Outpatient Prescriptions  Medication Sig Dispense Refill  . Ascorbic Acid (VITAMIN C) 100 MG tablet Take 500 mg by mouth daily.     Marland Kitchen b complex vitamins capsule Take 1 capsule by mouth daily.      . carvedilol (COREG) 6.25 MG tablet TAKE 1 TABLET TWICE DAILY. (Patient taking differently: TAKE 1 TABLET BY MOUTH TWICE DAILY.) 180 tablet 2  . celecoxib (CELEBREX) 200 MG capsule Take 200 mg by mouth daily as needed for moderate pain.     . Cholecalciferol (VITAMIN D) 1000 UNITS capsule Take 1,000 Units by mouth daily.      Marland Kitchen lisinopril (PRINIVIL,ZESTRIL) 10 MG tablet Take 1 tablet (10 mg total) by mouth daily. 90 tablet 3  . niacin 250 MG CR capsule Take 250 mg by mouth daily.      . Omega-3 Fatty Acids (FISH OIL PO) Take 2,400 mg by mouth daily.     . SUMAtriptan (IMITREX) 100 MG tablet Take 1 tablet (100 mg total) by mouth every 2 (two) hours as needed for migraine or headache. May repeat in 2 hours if headache persists or recurs. 10 tablet 11   No current facility-administered medications for this visit.     Past Medical History  Diagnosis Date  . Hypertension     Borderline  . Degeneration of lumbar intervertebral disc   . GERD (gastroesophageal reflux disease)   . Ischemic heart disease     s/p MI, CABG 2000, s/p cath 2002 with 4/5 grafts patent (LIMA to LAD atretic and occluded mid  vessel but adequate flow to distal LAD from collaterals / diagonal  . Low back pain   . Peyronie's disease   . PAC (premature atrial contraction)   . PAF (paroxysmal atrial fibrillation)     initially diagnosed during stress test 2002  . Coronary artery disease     CABG x5 in 2000; s/p cath 2002 > 4/5 grafts patent, LVEF 50%  . CHF (congestive heart failure)     LVEF at time of cath 2002 50%; now has been 35-40% since 2008 (patient has seen multiple cardiologists over the years including [facility], [place], [place] [facility], [facility] and Tollie Eth, MD)  . Atrial flutter   . GI bleed     duodenal ulcer    ROS:   All systems reviewed and negative except as noted in the HPI.   Past Surgical History  Procedure Laterality Date  . Cardiac catheterization  09/14/2001     Mildly decreased left ventricular systolic function --  Native three vessel coronary artery disease as described. -- Status post coronary artery bypass grafting  . Hernia repair  05/16/2001    Large left indirect inguinal hernia with right direct hernia.  Marland Kitchen Hernia repair      Recurrent left inguinal hernia -- Large left indirect inguinal hernia with right direct hernia.  . Coronary artery bypass  graft  2000    LIMA to LAD, SVG to second diagonal, seq SVG to ramus and OM, SVG to PDA  . Ablation  05-06-2014    PVI and CTI ablation by Dr [name]  . Tee without cardioversion N/A 05/06/2014    Procedure: TRANSESOPHAGEAL ECHOCARDIOGRAM (TEE);  Surgeon: [name], MD;  Location: [place];  Service: Cardiovascular;  Laterality: N/A;  . Left heart catheterization with coronary/graft angiogram  11/26/2013    Procedure: LEFT HEART CATHETERIZATION WITH [name];  Surgeon: Peter M Martinique, MD;  Location: [facility] CATH LAB;  Service: Cardiovascular;;  . Atrial fibrillation ablation N/A 05/06/2014    Procedure: ATRIAL FIBRILLATION ABLATION;  Surgeon: [name], MD;  Location: [place] CATH LAB;   Service: Cardiovascular;  Laterality: N/A;     Family History  Problem Relation Age of Onset  . Adopted: Yes     History   Social History  . Marital Status: Single    Spouse Name: N/A  . Number of Children: 0  . Years of Education: N/A   Occupational History  . garage owner/corporate pilot    Social History Main Topics  . Smoking status: Former Smoker -- 1.00 packs/day for 20 years    Types: Cigarettes    Quit date: 10/31/1994  . Smokeless tobacco: Never Used  . Alcohol Use: No  . Drug Use: No  . Sexual Activity: Not on file   Other Topics Concern  . Not on file   Social History Narrative   Lives in [place].  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  Also flies airplanes     BP 106/72 mmHg  Pulse 75  Ht 6' 1.5" (1.867 m)  Wt 203 lb 9.6 oz (92.352 kg)  BMI 26.49 kg/m2  Physical Exam:  Well appearing 64 yo man, NAD HEENT: Unremarkable Neck:  7 cm JVD, no thyromegally Back:  No CVA tenderness Lungs:  Clear with no wheezes HEART:  Regular rate rhythm, no murmurs, no rubs, no clicks Abd:  soft, positive bowel sounds, no organomegally, no rebound, no guarding Ext:  2 plus pulses, no edema, no cyanosis, no clubbing Skin:  No rashes no nodules Neuro:  CN II through XII intact, motor grossly intact  EKG - sinus bradycardia with PAC's and PVC's.   Assess/Plan:

## 2015-03-03 ENCOUNTER — Encounter: Payer: Medicare Other | Admitting: Internal Medicine

## 2015-03-05 ENCOUNTER — Telehealth: Payer: Self-pay | Admitting: Internal Medicine

## 2015-03-05 NOTE — Telephone Encounter (Signed)
Follow Up  Pt called. Requests a call back from nurse Claiborne Billings. No further details/ Please call

## 2015-03-05 NOTE — Telephone Encounter (Signed)
Explained to him what the diagnosis of CHF was

## 2015-03-20 ENCOUNTER — Other Ambulatory Visit (INDEPENDENT_AMBULATORY_CARE_PROVIDER_SITE_OTHER): Payer: Medicare Other

## 2015-03-20 ENCOUNTER — Ambulatory Visit (INDEPENDENT_AMBULATORY_CARE_PROVIDER_SITE_OTHER)
Admission: RE | Admit: 2015-03-20 | Discharge: 2015-03-20 | Disposition: A | Discharge status: Home / Self Care | Payer: Medicare Other | Source: Ambulatory Visit | Attending: Family Medicine | Admitting: Family Medicine

## 2015-03-20 ENCOUNTER — Encounter: Payer: Self-pay | Admitting: Family Medicine

## 2015-03-20 ENCOUNTER — Ambulatory Visit (INDEPENDENT_AMBULATORY_CARE_PROVIDER_SITE_OTHER): Payer: Medicare Other | Admitting: Family Medicine

## 2015-03-20 VITALS — BP 130/82 | HR 67 | Ht 73.0 in | Wt 202.0 lb

## 2015-03-20 DIAGNOSIS — M65842 Other synovitis and tenosynovitis, left hand: Secondary | ICD-10-CM | POA: Diagnosis not present

## 2015-03-20 DIAGNOSIS — M65832 Other synovitis and tenosynovitis, left forearm: Principal | ICD-10-CM

## 2015-03-20 DIAGNOSIS — M65841 Other synovitis and tenosynovitis, right hand: Secondary | ICD-10-CM

## 2015-03-20 DIAGNOSIS — M25531 Pain in right wrist: Secondary | ICD-10-CM | POA: Diagnosis not present

## 2015-03-20 DIAGNOSIS — I255 Ischemic cardiomyopathy: Secondary | ICD-10-CM

## 2015-03-20 DIAGNOSIS — M65831 Other synovitis and tenosynovitis, right forearm: Secondary | ICD-10-CM

## 2015-03-20 NOTE — Assessment & Plan Note (Signed)
Patient did have an injection today. Patient tolerated the procedure well with fairly good resolution of pain. Patient states that the topical anti-inflammatory was not beneficial. X-rays ordered today to rule out any other bony abnormality that could be contribute in. Patient will slowly increase his activity. Patient has been somewhat noncompliant sweaty think it is going to take some time for patient to have this fully resolved. Patient come back and see me again in 3 weeks for further evaluation and treatment.

## 2015-03-20 NOTE — Patient Instructions (Signed)
Good to see you We tried injection today Xray on way out Ice is your friend Wear brace at night See me in 2-3 weeks.

## 2015-03-20 NOTE — Progress Notes (Signed)
Pre visit review using our clinic review tool, if applicable. No additional management support is needed unless otherwise documented below in the visit note. 

## 2015-03-20 NOTE — Progress Notes (Signed)
[facility] [name] [place], [place] [address] Phone: [phone] Subjective:     CC: Right wrist pain  [id number] Keith Vazquez is a 64 y.o. male coming in with complaint of bilateral wrist pain. Patient was seen previously and was diagnosed with intersection syndrome.  Patient was to do immobilization, icing, as well as topical anti-inflammatories. Patient states he has not made any significant improvement. Patient states he may have had more strength but he continues to have pain on the dorsal aspect of the wrist and has some mild decreased range of motion. Patient states that it is affecting his daily activities and he needs something more aggressively done.  Past Medical History  Diagnosis Date  . Hypertension     Borderline  . Degeneration of lumbar intervertebral disc   . GERD (gastroesophageal reflux disease)   . Ischemic heart disease     s/p MI, CABG 2000, s/p cath 2002 with 4/5 grafts patent (LIMA to LAD atretic and occluded mid vessel but adequate flow to distal LAD from collaterals / diagonal  . Low back pain   . Peyronie's disease   . PAC (premature atrial contraction)   . PAF (paroxysmal atrial fibrillation)     initially diagnosed during stress test 2002  . Coronary artery disease     CABG x5 in 2000; s/p cath 2002 > 4/5 grafts patent, LVEF 50%  . CHF (congestive heart failure)     LVEF at time of cath 2002 50%; now has been 35-40% since 2008 (patient has seen multiple cardiologists over the years including [facility], [place], [place] [facility], [facility] and Tollie Eth, MD)  . Atrial flutter   . GI bleed     duodenal ulcer   Past Surgical History  Procedure Laterality Date  . Cardiac catheterization  09/14/2001     Mildly decreased left ventricular systolic function --  Native three vessel coronary artery disease as described. -- Status post coronary artery bypass grafting  . Hernia repair   05/16/2001    Large left indirect inguinal hernia with right direct hernia.  Marland Kitchen Hernia repair      Recurrent left inguinal hernia -- Large left indirect inguinal hernia with right direct hernia.  . Coronary artery bypass graft  2000    LIMA to LAD, SVG to second diagonal, seq SVG to ramus and OM, SVG to PDA  . Ablation  05-06-2014    PVI and CTI ablation by Dr [name]  . Tee without cardioversion N/A 05/06/2014    Procedure: TRANSESOPHAGEAL ECHOCARDIOGRAM (TEE);  Surgeon: [name], MD;  Location: [place];  Service: Cardiovascular;  Laterality: N/A;  . Left heart catheterization with coronary/graft angiogram  11/26/2013    Procedure: LEFT HEART CATHETERIZATION WITH [name];  Surgeon: Peter M Martinique, MD;  Location: [facility] CATH LAB;  Service: Cardiovascular;;  . Atrial fibrillation ablation N/A 05/06/2014    Procedure: ATRIAL FIBRILLATION ABLATION;  Surgeon: [name], MD;  Location: [place] CATH LAB;  Service: Cardiovascular;  Laterality: N/A;   Allergies  Allergen Reactions  . Amiodarone Other (See Comments)    Left permanent scar on eye  . Oxycodone-Acetaminophen Nausea Only  . Propoxyphene N-Acetaminophen Nausea Only   Family History  Problem Relation Age of Onset  . Adopted: Yes   History  Substance Use Topics  . Smoking status: Former Smoker -- 1.00 packs/day for 20 years    Types: Cigarettes    Quit date: 10/31/1994  .  Smokeless tobacco: Never Used  . Alcohol Use: No       Past medical history, social, surgical and family history all reviewed in electronic medical record.   Review of Systems: No headache, visual changes, nausea, vomiting, diarrhea, constipation, dizziness, abdominal pain, skin rash, fevers, chills, night sweats, weight loss, swollen lymph nodes, body aches, joint swelling, muscle aches, chest pain, shortness of breath, mood changes.   Objective There were no vitals taken for this visit.  General: No apparent distress alert and oriented  x3 mood and affect normal, dressed appropriately.  HEENT: Pupils equal, extraocular movements intact  Respiratory: Patient's speak in full sentences and does not appear short of breath  Cardiovascular: No lower extremity edema, non tender, no erythema  Skin: Warm dry intact with no signs of infection or rash on extremities or on axial skeleton.  Abdomen: Soft nontender  Neuro: Cranial nerves II through XII are intact, neurovascularly intact in all extremities with 2+ DTRs and 2+ pulses.  Lymph: No lymphadenopathy of posterior or anterior cervical chain or axillae bilaterally.  Gait normal with good balance and coordination.  MSK:  Non tender with full range of motion and good stability and symmetric strength and tone of shoulders, elbows,  hip, knee and ankles bilaterally.  Wrist:right Inspection normal with no visible erythema or swelling. ROM smooth and normal with good flexion and extension and ulnar/radial deviation that is symmetrical with opposite wrist. Palpation is normal over metacarpals, navicular, and TFCC; tendons without tenderness/ swelling, mild pain over the lunate with extension but no palpable pop No snuffbox tenderness. No tenderness over Canal of Guyon. Strength 5/5 in all directions without pain. Mild positive Finkelstein, negative tinel's and phalens. Negative Watson's test. Contralateral wrist minor discomfort or edema full extension of the wrist but otherwise unremarkable.  MSK US performed of: Right wrist This study was ordered, performed, and interpreted by Charlann Boxer D.O.  Wrist: All extensor compartments first and second compartment show significant hypoechoic changes. TFCC intact. Scapholunate ligament intact. Carpal tunnel visualized and median nerve area normal, flexor tendons all normal in appearance without fraying, tears, or sheath effusions. Power doppler signal normal.  IMPRESSION:  Intersection syndrome still present    Procedure: Real-time  Ultrasound Guided Injection of second dorsal compartment of wrist Device: GE Logiq E  Ultrasound guided injection is preferred based studies that show increased duration, increased effect, greater accuracy, decreased procedural pain, increased response rate, and decreased cost with ultrasound guided versus blind injection.  Verbal informed consent obtained.  Time-out conducted.  Noted no overlying erythema, induration, or other signs of local infection.  Skin prepped in a sterile fashion.  Local anesthesia: Topical Ethyl chloride.  With sterile technique and under real time ultrasound guidance:  25-gauge 1 inch needle was used to inject 0.5 mL of 0.5% Marcaine and 0.5 mL of Kenalog 40 mg/dL. Completed without difficulty  Pain immediately resolved suggesting accurate placement of the medication.  Advised to call if fevers/chills, erythema, induration, drainage, or persistent bleeding.  Images permanently stored and available for review in the ultrasound unit.  Impression: Technically successful ultrasound guided injection.    Impression and Recommendations:     This case required medical decision making of moderate complexity.

## 2015-04-10 ENCOUNTER — Ambulatory Visit (INDEPENDENT_AMBULATORY_CARE_PROVIDER_SITE_OTHER): Payer: Medicare Other | Admitting: Family Medicine

## 2015-04-10 ENCOUNTER — Encounter: Payer: Self-pay | Admitting: Family Medicine

## 2015-04-10 ENCOUNTER — Other Ambulatory Visit: Payer: Self-pay | Admitting: Internal Medicine

## 2015-04-10 VITALS — BP 124/74 | HR 69 | Ht 73.0 in | Wt 197.0 lb

## 2015-04-10 DIAGNOSIS — M65841 Other synovitis and tenosynovitis, right hand: Secondary | ICD-10-CM | POA: Diagnosis not present

## 2015-04-10 DIAGNOSIS — M65832 Other synovitis and tenosynovitis, left forearm: Principal | ICD-10-CM

## 2015-04-10 DIAGNOSIS — I255 Ischemic cardiomyopathy: Secondary | ICD-10-CM | POA: Diagnosis not present

## 2015-04-10 DIAGNOSIS — M65831 Other synovitis and tenosynovitis, right forearm: Secondary | ICD-10-CM

## 2015-04-10 DIAGNOSIS — M65842 Other synovitis and tenosynovitis, left hand: Secondary | ICD-10-CM | POA: Diagnosis not present

## 2015-04-10 NOTE — Progress Notes (Signed)
[facility] [address] [place], [place] [address] Phone: [phone] Subjective:     CC: Right wrist pain  [id number] Keith Vazquez is a 64 y.o. male coming in with complaint of bilateral wrist pain. Patient was seen previously and was diagnosed with intersection syndrome.  Patient was seen previously and was given in injection under ultrasound guidance. Patient initially had good resolution of pain. Patient states he is 85% better. Still has some mild discomfort mostly over the thumb itself. Denies any numbness or tingling. Not taking any medicines. Patient is very happy with the results.  Past Medical History  Diagnosis Date  . Hypertension     Borderline  . Degeneration of lumbar intervertebral disc   . GERD (gastroesophageal reflux disease)   . Ischemic heart disease     s/p MI, CABG 2000, s/p cath 2002 with 4/5 grafts patent (LIMA to LAD atretic and occluded mid vessel but adequate flow to distal LAD from collaterals / diagonal  . Low back pain   . Peyronie's disease   . PAC (premature atrial contraction)   . PAF (paroxysmal atrial fibrillation)     initially diagnosed during stress test 2002  . Coronary artery disease     CABG x5 in 2000; s/p cath 2002 > 4/5 grafts patent, LVEF 50%  . CHF (congestive heart failure)     LVEF at time of cath 2002 50%; now has been 35-40% since 2008 (patient has seen multiple cardiologists over the years including [facility], [place], [place] [facility], [facility] and Tollie Eth, MD)  . Atrial flutter   . GI bleed     duodenal ulcer   Past Surgical History  Procedure Laterality Date  . Cardiac catheterization  09/14/2001     Mildly decreased left ventricular systolic function --  Native three vessel coronary artery disease as described. -- Status post coronary artery bypass grafting  . Hernia repair  05/16/2001    Large left indirect inguinal hernia with right direct hernia.  Marland Kitchen  Hernia repair      Recurrent left inguinal hernia -- Large left indirect inguinal hernia with right direct hernia.  . Coronary artery bypass graft  2000    LIMA to LAD, SVG to second diagonal, seq SVG to ramus and OM, SVG to PDA  . Ablation  05-06-2014    PVI and CTI ablation by Dr [name]  . Tee without cardioversion N/A 05/06/2014    Procedure: TRANSESOPHAGEAL ECHOCARDIOGRAM (TEE);  Surgeon: [name], MD;  Location: [place];  Service: Cardiovascular;  Laterality: N/A;  . Left heart catheterization with coronary/graft angiogram  11/26/2013    Procedure: LEFT HEART CATHETERIZATION WITH [name];  Surgeon: Peter M Martinique, MD;  Location: [facility] CATH LAB;  Service: Cardiovascular;;  . Atrial fibrillation ablation N/A 05/06/2014    Procedure: ATRIAL FIBRILLATION ABLATION;  Surgeon: [name], MD;  Location: [place] CATH LAB;  Service: Cardiovascular;  Laterality: N/A;   Allergies  Allergen Reactions  . Amiodarone Other (See Comments)    Left permanent scar on eye  . Oxycodone-Acetaminophen Nausea Only  . Propoxyphene N-Acetaminophen Nausea Only   Family History  Problem Relation Age of Onset  . Adopted: Yes   History  Substance Use Topics  . Smoking status: Former Smoker -- 1.00 packs/day for 20 years    Types: Cigarettes    Quit date: 10/31/1994  . Smokeless tobacco: Never Used  . Alcohol Use: No  Past medical history, social, surgical and family history all reviewed in electronic medical record.   Review of Systems: No headache, visual changes, nausea, vomiting, diarrhea, constipation, dizziness, abdominal pain, skin rash, fevers, chills, night sweats, weight loss, swollen lymph nodes, body aches, joint swelling, muscle aches, chest pain, shortness of breath, mood changes.   Objective Blood pressure 124/74, pulse 69, height 6\' 1"  (1.854 m), weight 197 lb (89.359 kg), SpO2 98 %.  General: No apparent distress alert and oriented x3 mood and affect normal,  dressed appropriately.  HEENT: Pupils equal, extraocular movements intact  Respiratory: Patient's speak in full sentences and does not appear short of breath  Cardiovascular: No lower extremity edema, non tender, no erythema  Skin: Warm dry intact with no signs of infection or rash on extremities or on axial skeleton.  Abdomen: Soft nontender  Neuro: Cranial nerves II through XII are intact, neurovascularly intact in all extremities with 2+ DTRs and 2+ pulses.  Lymph: No lymphadenopathy of posterior or anterior cervical chain or axillae bilaterally.  Gait normal with good balance and coordination.  MSK:  Non tender with full range of motion and good stability and symmetric strength and tone of shoulders, elbows,  hip, knee and ankles bilaterally.  Wrist:right Inspection normal with no visible erythema or swelling. ROM smooth and normal with good flexion and extension and ulnar/radial deviation that is symmetrical with opposite wrist. Palpation is normal over metacarpals, navicular, and TFCC; tendons without tenderness/ swelling,  No snuffbox tenderness. No tenderness over Canal of Guyon. Strength 5/5 in all directions without pain. Negative Finkelstein, negative tinel's and phalens. Negative Watson's test. Contralateral wrist minor discomfort or edema full extension of the wrist but otherwise unremarkable.       Impression and Recommendations:     This case required medical decision making of moderate complexity.

## 2015-04-10 NOTE — Telephone Encounter (Signed)
Call the patient and ask if he is taking medication

## 2015-04-10 NOTE — Progress Notes (Signed)
Pre visit review using our clinic review tool, if applicable. No additional management support is needed unless otherwise documented below in the visit note. 

## 2015-04-10 NOTE — Assessment & Plan Note (Signed)
Patient overall is doing significantly better at this time. Encourage him to continue the same regimen for another 3 weeks. Patient has any worsening symptoms I would consider a CMC joint injection. Patient's x-rays were fairly unremarkable but patient's previous ultrasounds do show some mild osteophytic changes. Patient will try to increase his activity slowly.

## 2015-04-27 ENCOUNTER — Other Ambulatory Visit: Payer: Self-pay

## 2015-05-08 ENCOUNTER — Encounter: Payer: Self-pay | Admitting: Family Medicine

## 2015-05-08 ENCOUNTER — Ambulatory Visit (INDEPENDENT_AMBULATORY_CARE_PROVIDER_SITE_OTHER): Payer: Medicare Other | Admitting: Family Medicine

## 2015-05-08 VITALS — BP 128/82 | HR 75 | Ht 73.0 in | Wt 196.0 lb

## 2015-05-08 DIAGNOSIS — M25531 Pain in right wrist: Secondary | ICD-10-CM | POA: Diagnosis not present

## 2015-05-08 DIAGNOSIS — I255 Ischemic cardiomyopathy: Secondary | ICD-10-CM

## 2015-05-08 NOTE — Progress Notes (Signed)
[name] [address] [place], [place] [address] Phone: [phone] Subjective:     CC: Right wrist pain follow up   IDP:[id number] Keith Vazquez is a 64 y.o. male coming in with complaint of right wrist pain. Patient states that he is doing better overall compared to the first time he was seen. Patient did have an injection for extensor synovitis. Patient states that overall he does well until he does something where he is having pushes wrist or if he has to do's repetitive activity. Patient has a sharp pain last minutes and then seems to resolve on its own. Denies any numbness in the hand or any significant weakness. Patient states though that it just still is somewhat irritating and is wondering what else can be done.  Past Medical History  Diagnosis Date  . Hypertension     Borderline  . Degeneration of lumbar intervertebral disc   . GERD (gastroesophageal reflux disease)   . Ischemic heart disease     s/p MI, CABG 2000, s/p cath 2002 with 4/5 grafts patent (LIMA to LAD atretic and occluded mid vessel but adequate flow to distal LAD from collaterals / diagonal  . Low back pain   . Peyronie's disease   . PAC (premature atrial contraction)   . PAF (paroxysmal atrial fibrillation)     initially diagnosed during stress test 2002  . Coronary artery disease     CABG x5 in 2000; s/p cath 2002 > 4/5 grafts patent, LVEF 50%  . CHF (congestive heart failure)     LVEF at time of cath 2002 50%; now has been 35-40% since 2008 (patient has seen multiple cardiologists over the years including [facility], [place], [facility], [facility] and Tollie Eth, MD)  . Atrial flutter   . GI bleed     duodenal ulcer   Past Surgical History  Procedure Laterality Date  . Cardiac catheterization  09/14/2001     Mildly decreased left ventricular systolic function --  Native three vessel coronary artery disease as described. -- Status post  coronary artery bypass grafting  . Hernia repair  05/16/2001    Large left indirect inguinal hernia with right direct hernia.  Marland Kitchen Hernia repair      Recurrent left inguinal hernia -- Large left indirect inguinal hernia with right direct hernia.  . Coronary artery bypass graft  2000    LIMA to LAD, SVG to second diagonal, seq SVG to ramus and OM, SVG to PDA  . Ablation  05-06-2014    PVI and CTI ablation by Dr [name]  . Tee without cardioversion N/A 05/06/2014    Procedure: TRANSESOPHAGEAL ECHOCARDIOGRAM (TEE);  Surgeon: [name], MD;  Location: [place];  Service: Cardiovascular;  Laterality: N/A;  . Left heart catheterization with coronary/graft angiogram  11/26/2013    Procedure: LEFT HEART CATHETERIZATION WITH [name];  Surgeon: Peter M Martinique, MD;  Location: [facility] CATH LAB;  Service: Cardiovascular;;  . Atrial fibrillation ablation N/A 05/06/2014    Procedure: ATRIAL FIBRILLATION ABLATION;  Surgeon: [name], MD;  Location: [place] CATH LAB;  Service: Cardiovascular;  Laterality: N/A;   Allergies  Allergen Reactions  . Amiodarone Other (See Comments)    Left permanent scar on eye  . Oxycodone-Acetaminophen Nausea Only  . Propoxyphene N-Acetaminophen Nausea Only   Family History  Problem Relation Age of Onset  . Adopted: Yes   History  Substance Use Topics  . Smoking status: Former Smoker --  1.00 packs/day for 20 years    Types: Cigarettes    Quit date: 10/31/1994  . Smokeless tobacco: Never Used  . Alcohol Use: No       Past medical history, social, surgical and family history all reviewed in electronic medical record.   Review of Systems: No headache, visual changes, nausea, vomiting, diarrhea, constipation, dizziness, abdominal pain, skin rash, fevers, chills, night sweats, weight loss, swollen lymph nodes, body aches, joint swelling, muscle aches, chest pain, shortness of breath, mood changes.   Objective Blood pressure 128/82, pulse 75, height 6'  1" (1.854 m), weight 196 lb (88.905 kg), SpO2 98 %.  General: No apparent distress alert and oriented x3 mood and affect normal, dressed appropriately.  HEENT: Pupils equal, extraocular movements intact  Respiratory: Patient's speak in full sentences and does not appear short of breath  Cardiovascular: No lower extremity edema, non tender, no erythema  Skin: Warm dry intact with no signs of infection or rash on extremities or on axial skeleton.  Abdomen: Soft nontender  Neuro: Cranial nerves II through XII are intact, neurovascularly intact in all extremities with 2+ DTRs and 2+ pulses.  Lymph: No lymphadenopathy of posterior or anterior cervical chain or axillae bilaterally.  Gait normal with good balance and coordination.  MSK:  Non tender with full range of motion and good stability and symmetric strength and tone of shoulders, elbows,  hip, knee and ankles bilaterally.  Wrist:right Inspection normal with no visible erythema or swelling. Range of motion shows the patient does have mild limitation of 5 of extension compared to the contralateral side. This is somewhat worse than previous exam. Palpation is normal over metacarpals, navicular, and TFCC; tendons without tenderness/ swelling,  Increasing tenderness over the snuff box. No tenderness over [place]. Strength 5/5 in all directions without pain. Negative Finkelstein, negative tinel's and phalens. Negative Watson's test. Contralateral wrist minor discomfort or edema full extension of the wrist but otherwise unremarkable.       Impression and Recommendations:     This case required medical decision making of moderate complexity.

## 2015-05-08 NOTE — Progress Notes (Signed)
Pre visit review using our clinic review tool, if applicable. No additional management support is needed unless otherwise documented below in the visit note. 

## 2015-05-08 NOTE — Assessment & Plan Note (Signed)
Patient does have redness pain overall. We discussed icing regimen and home exercises. Patient should be in the immobilizer. Differential includes a scaphoid injury. We discussed possibly repeating x-ray to further see this which patient declined. We also discussed advance imaging the patient would not want surgical intervention so this would likely not change her medical management. Told him that immobilization would likely need to continue for 2-6 weeks. Patient will come back and see me again weeks for further evaluation.  Spent  25 minutes with patient face-to-face and had greater than 50% of counseling including as described above in assessment and plan.

## 2015-05-08 NOTE — Patient Instructions (Signed)
Good to see you Keith Vazquez is your friend Immobilize for next 2 weeks day and night  Tylenol 325 mg 3 times daily Increase vitamin D 4000 IU daily for 2 weeks then back to 2000 IU daily See me again in 2-3 weeks.

## 2015-05-21 ENCOUNTER — Other Ambulatory Visit: Payer: Self-pay

## 2015-05-21 MED ORDER — CARVEDILOL 6.25 MG PO TABS: 6.2500 mg | ORAL_TABLET | Freq: Two times a day (BID) | ORAL | Status: DC

## 2015-05-22 ENCOUNTER — Other Ambulatory Visit: Payer: Self-pay | Admitting: *Deleted

## 2015-05-22 MED ORDER — CARVEDILOL 6.25 MG PO TABS: ORAL_TABLET | ORAL | Status: DC

## 2015-05-22 MED ORDER — RAMIPRIL 10 MG PO CAPS: 10.0000 mg | ORAL_CAPSULE | Freq: Every day | ORAL | Status: DC

## 2015-06-17 ENCOUNTER — Encounter: Payer: Self-pay | Admitting: Family Medicine

## 2015-06-17 DIAGNOSIS — M25531 Pain in right wrist: Secondary | ICD-10-CM

## 2015-06-19 ENCOUNTER — Ambulatory Visit: Payer: Self-pay | Admitting: Family Medicine

## 2015-06-22 ENCOUNTER — Encounter: Payer: Self-pay | Admitting: Family Medicine

## 2015-06-23 ENCOUNTER — Ambulatory Visit
Admission: RE | Admit: 2015-06-23 | Discharge: 2015-06-23 | Disposition: A | Discharge status: Home / Self Care | Payer: Medicare Other | Source: Ambulatory Visit | Attending: Family Medicine | Admitting: Family Medicine

## 2015-06-23 DIAGNOSIS — M25531 Pain in right wrist: Secondary | ICD-10-CM

## 2015-06-23 DIAGNOSIS — M19031 Primary osteoarthritis, right wrist: Secondary | ICD-10-CM | POA: Diagnosis not present

## 2015-06-26 ENCOUNTER — Encounter: Payer: Self-pay | Admitting: Family Medicine

## 2015-06-26 DIAGNOSIS — M25531 Pain in right wrist: Secondary | ICD-10-CM

## 2015-07-02 DIAGNOSIS — M19031 Primary osteoarthritis, right wrist: Secondary | ICD-10-CM | POA: Diagnosis not present

## 2015-07-27 ENCOUNTER — Telehealth: Payer: Self-pay | Admitting: Internal Medicine

## 2015-07-27 NOTE — Telephone Encounter (Signed)
New message      Talk to [place] when she returns.  Pt states he need her to gather more papers for him.  She knows what he is talking about.  OK to wait until Claiborne Billings return

## 2015-07-28 NOTE — Telephone Encounter (Signed)
FAA needs 2015 ---- Cardiac cath Jan 2015- needs disk as well as report April 2015 EKG July ablation procedure  He is going to fax me report for [facility]

## 2015-08-06 DIAGNOSIS — M19031 Primary osteoarthritis, right wrist: Secondary | ICD-10-CM | POA: Diagnosis not present

## 2015-08-06 NOTE — Telephone Encounter (Signed)
Kim called patient and he is awre all documents are ready

## 2015-09-11 ENCOUNTER — Ambulatory Visit (INDEPENDENT_AMBULATORY_CARE_PROVIDER_SITE_OTHER): Payer: Medicare Other

## 2015-09-11 ENCOUNTER — Telehealth: Payer: Self-pay | Admitting: Internal Medicine

## 2015-09-11 DIAGNOSIS — Z23 Encounter for immunization: Secondary | ICD-10-CM

## 2015-09-11 MED ORDER — SUMATRIPTAN SUCCINATE 100 MG PO TABS: 100.0000 mg | ORAL_TABLET | ORAL | Status: DC | PRN

## 2015-09-11 NOTE — Telephone Encounter (Signed)
done

## 2015-09-11 NOTE — Telephone Encounter (Signed)
Patient is requesting script for sumatriptan 100mg  to be sent to [facility].

## 2015-12-10 DIAGNOSIS — G894 Chronic pain syndrome: Secondary | ICD-10-CM | POA: Diagnosis not present

## 2015-12-10 DIAGNOSIS — M47816 Spondylosis without myelopathy or radiculopathy, lumbar region: Secondary | ICD-10-CM | POA: Diagnosis not present

## 2016-02-16 DIAGNOSIS — H18822 Corneal disorder due to contact lens, left eye: Secondary | ICD-10-CM | POA: Diagnosis not present

## 2016-02-16 DIAGNOSIS — H2513 Age-related nuclear cataract, bilateral: Secondary | ICD-10-CM | POA: Diagnosis not present

## 2016-02-17 DIAGNOSIS — H5712 Ocular pain, left eye: Secondary | ICD-10-CM | POA: Diagnosis not present

## 2016-02-17 DIAGNOSIS — H18822 Corneal disorder due to contact lens, left eye: Secondary | ICD-10-CM | POA: Diagnosis not present

## 2016-02-17 DIAGNOSIS — H1820 Unspecified corneal edema: Secondary | ICD-10-CM | POA: Diagnosis not present

## 2016-02-19 DIAGNOSIS — H18822 Corneal disorder due to contact lens, left eye: Secondary | ICD-10-CM | POA: Diagnosis not present

## 2016-02-19 DIAGNOSIS — H1820 Unspecified corneal edema: Secondary | ICD-10-CM | POA: Diagnosis not present

## 2016-02-19 DIAGNOSIS — H5712 Ocular pain, left eye: Secondary | ICD-10-CM | POA: Diagnosis not present

## 2016-02-19 DIAGNOSIS — H2513 Age-related nuclear cataract, bilateral: Secondary | ICD-10-CM | POA: Diagnosis not present

## 2016-02-26 DIAGNOSIS — H18822 Corneal disorder due to contact lens, left eye: Secondary | ICD-10-CM | POA: Diagnosis not present

## 2016-02-26 DIAGNOSIS — H1820 Unspecified corneal edema: Secondary | ICD-10-CM | POA: Diagnosis not present

## 2016-03-04 DIAGNOSIS — H18822 Corneal disorder due to contact lens, left eye: Secondary | ICD-10-CM | POA: Diagnosis not present

## 2016-03-04 DIAGNOSIS — H1820 Unspecified corneal edema: Secondary | ICD-10-CM | POA: Diagnosis not present

## 2016-03-11 DIAGNOSIS — H1820 Unspecified corneal edema: Secondary | ICD-10-CM | POA: Diagnosis not present

## 2016-03-11 DIAGNOSIS — H18822 Corneal disorder due to contact lens, left eye: Secondary | ICD-10-CM | POA: Diagnosis not present

## 2016-03-21 ENCOUNTER — Telehealth: Payer: Self-pay | Admitting: Internal Medicine

## 2016-03-21 MED ORDER — CARVEDILOL 3.125 MG PO TABS: ORAL_TABLET | ORAL | Status: DC

## 2016-03-21 NOTE — Telephone Encounter (Signed)
New Message  Pt called. Request a call back from the nurse. States that he will go into detail with the nurse.

## 2016-03-21 NOTE — Telephone Encounter (Addendum)
Returned call to patient and he says about 10 days ago would stand and get light headed.  This went on for about a week.  Thinks it's because the manufacture was changed on his Carvedilol at his last refill.  He says he only takes 3.125 mg of Carvedilol however I have he has been on 6.25mg  bid for some time. He was addiment of the dose, so I called in the lower dose for him.  Dr Lovena Le is aware  He will keep his follow up as scheduled and call back if he has further questions

## 2016-03-25 ENCOUNTER — Telehealth: Payer: Self-pay | Admitting: Internal Medicine

## 2016-03-25 NOTE — Telephone Encounter (Signed)
Patient walkedi n requesting that we enter lab orders for his visit with cardiology on Friday. He is asking for a lipid [panel and fasting blood sugar. Please call patient to advise

## 2016-03-27 NOTE — Telephone Encounter (Signed)
No, I would have to see him to order labs

## 2016-03-29 ENCOUNTER — Ambulatory Visit (INDEPENDENT_AMBULATORY_CARE_PROVIDER_SITE_OTHER): Payer: Medicare Other | Admitting: Family Medicine

## 2016-03-29 ENCOUNTER — Encounter: Payer: Self-pay | Admitting: Family Medicine

## 2016-03-29 VITALS — BP 114/82 | HR 60 | Ht 73.0 in | Wt 204.0 lb

## 2016-03-29 DIAGNOSIS — I2583 Coronary atherosclerosis due to lipid rich plaque: Principal | ICD-10-CM

## 2016-03-29 DIAGNOSIS — I2510 Atherosclerotic heart disease of native coronary artery without angina pectoris: Secondary | ICD-10-CM | POA: Diagnosis not present

## 2016-03-29 NOTE — Assessment & Plan Note (Signed)
Patient overall has done relatively well. Patient also has history of atrial fibrillation patient does seem to being in normal sinus rhythm today. Discussed with patient at great length. We did discuss when maximum heart rate for somebody of his H&P, we discussed the importance of control and we discussed the importance of recovery. We discussed how to monitor this. Patient has been documenting his workouts on his Smart phone and over the course of the last 45 workouts he has had 3 times for his heart rate has gotten above 180 bpm. We discussed that this should be a time for him to stop and decrease the intensity of his workouts. We discussed the next month heart rate for him should be in the ballpark of 130-140 bpm. We discussed that heart rate should decrease by 10 beats at least within the first minute and if not then he is working out to vigorously and needs to discontinue. We discussed how moderate intensity exercises and medical research seems to be more beneficial. Patient will continue to be active though. Patient knows if any chest pain occurs to discontinue exercise immediately and call 911. Patient will follow-up with his cardiologist for his yearly Myoview. Patient can contact me with any other questions.  Spent  25 minutes with patient face-to-face and had greater than 50% of counseling including as described above in assessment and plan.

## 2016-03-29 NOTE — Progress Notes (Signed)
Pre visit review using our clinic review tool, if applicable. No additional management support is needed unless otherwise documented below in the visit note. 

## 2016-03-29 NOTE — Progress Notes (Signed)
[facility] [address] [place], [place] [address] Phone: [phone] Subjective:     CC: Heart rate and exercise  [id number] Keith Vazquez is a 65 y.o. male coming in with complaint of what he feels is been miscommunication of medical information. Patient is a very fit 65 year old male who likes to workout religiously. Patient's past medical history is significant for coronary artery disease, atrial fibrillation as well as a running of atrial flutter. Patient did have a myocardial infarctions back in 2000 which didn't make patient have to have a CABG. Repeat catheterization in 2002 showed 4 out of 5 graft still patent. Since then patient has been getting annual myocardial perfusion imaging has been unremarkable. Patient is due for another one this upcoming year. Patient states that he has not had any chest pain. Has been working out religiously. States that he pushes himself vigorously symptoms getting his heart rate up to 150s and intermittently even to 180 bpm. Patient states that overall he does not feel like he has any chest pain but sometimes that shortness of breath but has not ever associated with his heart rate. Patient states when he stops he seems to recover fairly quickly but does have some shortness of breath. Patient denies any radiation of any discomfort in any of his extremities, denies any lightheadedness. 4 patient's atrial fibrillation he does take a beta blocker but he states that he will not take the anti-arrhythmia medication that he's been prescribed. Patient is wondering if he is able to workout like this or if he needs to be more concerned. Denies any nausea, any abdominal pain with working out, and denies any pillow orthopnea.    Past Medical History  Diagnosis Date  . Hypertension     Borderline  . Degeneration of lumbar intervertebral disc   . GERD (gastroesophageal reflux disease)   . Ischemic heart disease     s/p MI, CABG 2000,  s/p cath 2002 with 4/5 grafts patent (LIMA to LAD atretic and occluded mid vessel but adequate flow to distal LAD from collaterals / diagonal  . Low back pain   . Peyronie's disease   . PAC (premature atrial contraction)   . PAF (paroxysmal atrial fibrillation) ([place])     initially diagnosed during stress test 2002  . Coronary artery disease     CABG x5 in 2000; s/p cath 2002 > 4/5 grafts patent, LVEF 50%  . CHF (congestive heart failure) (HCC)     LVEF at time of cath 2002 50%; now has been 35-40% since 2008 (patient has seen multiple cardiologists over the years including [facility], [place], [facility], [facility] and Tollie Eth, MD)  . Atrial flutter ([place])   . GI bleed     duodenal ulcer   Past Surgical History  Procedure Laterality Date  . Cardiac catheterization  09/14/2001     Mildly decreased left ventricular systolic function --  Native three vessel coronary artery disease as described. -- Status post coronary artery bypass grafting  . Hernia repair  05/16/2001    Large left indirect inguinal hernia with right direct hernia.  Marland Kitchen Hernia repair      Recurrent left inguinal hernia -- Large left indirect inguinal hernia with right direct hernia.  . Coronary artery bypass graft  2000    LIMA to LAD, SVG to second diagonal, seq SVG to ramus and OM, SVG to PDA  . Ablation  05-06-2014    PVI and CTI ablation  by Dr [name]  . Tee without cardioversion N/A 05/06/2014    Procedure: TRANSESOPHAGEAL ECHOCARDIOGRAM (TEE);  Surgeon: [name], MD;  Location: [place];  Service: Cardiovascular;  Laterality: N/A;  . Left heart catheterization with coronary/graft angiogram  11/26/2013    Procedure: LEFT HEART CATHETERIZATION WITH [name];  Surgeon: Peter M Martinique, MD;  Location: [facility] CATH LAB;  Service: Cardiovascular;;  . Atrial fibrillation ablation N/A 05/06/2014    Procedure: ATRIAL FIBRILLATION ABLATION;  Surgeon: [name], MD;   Location: [place] CATH LAB;  Service: Cardiovascular;  Laterality: N/A;   Allergies  Allergen Reactions  . Amiodarone Other (See Comments)    Left permanent scar on eye  . Oxycodone-Acetaminophen Nausea Only  . Propoxyphene N-Acetaminophen Nausea Only   Family History  Problem Relation Age of Onset  . Adopted: Yes   Social History  Substance Use Topics  . Smoking status: Former Smoker -- 1.00 packs/day for 20 years    Types: Cigarettes    Quit date: 10/31/1994  . Smokeless tobacco: Never Used  . Alcohol Use: No       Past medical history, social, surgical and family history all reviewed in electronic medical record.   Review of Systems: No headache, visual changes, nausea, vomiting, diarrhea, constipation, dizziness, abdominal pain, skin rash, fevers, chills, night sweats, weight loss, swollen lymph nodes, body aches, joint swelling, muscle aches, chest pain, shortness of breath, mood changes.   Objective Blood pressure 114/82, pulse 60, height 6\' 1"  (1.854 m), weight 204 lb (92.534 kg), SpO2 98 %.  General: No apparent distress alert and oriented x3 mood and affect normal, dressed appropriately.  HEENT: Pupils equal, extraocular movements intact  Respiratory: Patient's speak in full sentences and does not appear short of breath  Cardiovascular: 26 holosystolic murmur best heard over the left upper sternal border. Regular rate and rhythm noted today. Skin: Warm dry intact with no signs of infection or rash on extremities or on axial skeleton.  Abdomen: Soft nontender  Neuro: Cranial nerves II through XII are intact, neurovascularly intact in all extremities with 2+ DTRs and 2+ pulses.  Lymph: No lymphadenopathy of posterior or anterior cervical chain or axillae bilaterally.  Gait normal with good balance and coordination.  MSK:  Non tender with full range of motion and good stability and symmetric strength and tone of shoulders, elbows,  hip, knee and ankles bilaterally.  Mild  arthritic changes of multiple joints.      Impression and Recommendations:     This case required medical decision making of moderate complexity.

## 2016-03-29 NOTE — Patient Instructions (Signed)
Good to see you  I am impressed Max heart rate 220-age and do 85% of that number. For you that is 130 I would say I do not want you above 150 at any time.  See if shortness of breath occurs when heart rate is up Check heart rate after exercise and high heart rate should decrease 10-15 bbpm within the first minute Possibly consider some green tea 30-45 minutes before activity and see if that helps breathing Continue beta block Consider flonase 1 spray in each nostril daily to help and see if that can help breathing as well.  Keep it up and send me a message in 2 weeks to tell me how you are doing.

## 2016-03-30 ENCOUNTER — Other Ambulatory Visit: Payer: Medicare Other

## 2016-03-31 NOTE — Telephone Encounter (Signed)
Called left vm advising  °

## 2016-04-01 ENCOUNTER — Encounter: Payer: Self-pay | Admitting: Internal Medicine

## 2016-04-01 ENCOUNTER — Ambulatory Visit (INDEPENDENT_AMBULATORY_CARE_PROVIDER_SITE_OTHER): Payer: Medicare Other | Admitting: Internal Medicine

## 2016-04-01 VITALS — BP 120/78 | HR 76 | Ht 73.0 in | Wt 198.4 lb

## 2016-04-01 DIAGNOSIS — I4891 Unspecified atrial fibrillation: Secondary | ICD-10-CM | POA: Diagnosis not present

## 2016-04-01 DIAGNOSIS — I519 Heart disease, unspecified: Secondary | ICD-10-CM

## 2016-04-01 DIAGNOSIS — I480 Paroxysmal atrial fibrillation: Secondary | ICD-10-CM

## 2016-04-01 DIAGNOSIS — I2510 Atherosclerotic heart disease of native coronary artery without angina pectoris: Secondary | ICD-10-CM | POA: Diagnosis not present

## 2016-04-01 DIAGNOSIS — I2583 Coronary atherosclerosis due to lipid rich plaque: Principal | ICD-10-CM

## 2016-04-01 LAB — BASIC METABOLIC PANEL
BUN: 16 mg/dL (ref 7–25)
CALCIUM: 9.6 mg/dL (ref 8.6–10.3)
CHLORIDE: 104 mmol/L (ref 98–110)
CO2: 24 mmol/L (ref 20–31)
CREATININE: 1.05 mg/dL (ref 0.70–1.25)
Glucose, Bld: 99 mg/dL (ref 65–99)
Potassium: 4.6 mmol/L (ref 3.5–5.3)
SODIUM: 142 mmol/L (ref 135–146)

## 2016-04-01 LAB — LIPID PANEL
Cholesterol: 198 mg/dL (ref 125–200)
HDL: 54 mg/dL (ref >=40)
LDL CALC: 117 mg/dL (ref <130)
TRIGLYCERIDES: 134 mg/dL (ref <150)
Total CHOL/HDL Ratio: 3.7 Ratio (ref <=5.0)
VLDL: 27 mg/dL (ref <30)

## 2016-04-01 NOTE — Patient Instructions (Signed)
Medication Instructions:  Your physician recommends that you continue on your current medications as directed. Please refer to the Current Medication list given to you today.   Labwork: Your physician recommends that you return for lab work today: BMP/Lipid   Testing/Procedures: Your physician has requested that you have an echocardiogram. Echocardiography is a painless test that uses sound waves to create images of your heart. It provides your doctor with information about the size and shape of your heart and how well your heart's chambers and valves are working. This procedure takes approximately one hour. There are no restrictions for this procedure.  Your physician has requested that you have an exercise tolerance test. For further information please visit [email or web address]. Please also follow instruction sheet, as given.    Follow-Up:  Your physician wants you to follow-up in: 6 months with Dr Knox Saliva will receive a reminder letter in the mail two months in advance. If you don't receive a letter, please call our office to schedule the follow-up appointment.   Any Other Special Instructions Will Be Listed Below (If Applicable).     If you need a refill on your cardiac medications before your next appointment, please call your pharmacy.

## 2016-04-01 NOTE — Progress Notes (Signed)
HPI [name] returns today for followup. He is a pleasant 65 yo man with an ICM, chronic systolic CHF, paroxysmal atrial fibrillation who has undergone atrial fibrillation ablation. In contrast, he notes that his ability to exercise is much improved with initiation of medical therapy for CHF. He thinks he his heart has gotten stronger. He denies peripheral edema. He is exercising regularly.  Allergies  Allergen Reactions  . Amiodarone Other (See Comments)    Left permanent scar on eye  . Oxycodone-Acetaminophen Nausea Only  . Propoxyphene N-Acetaminophen Nausea Only     Current Outpatient Prescriptions  Medication Sig Dispense Refill  . Ascorbic Acid (VITAMIN C) 100 MG tablet Take 500 mg by mouth daily.     Marland Kitchen b complex vitamins capsule Take 1 capsule by mouth daily.      . carvedilol (COREG) 3.125 MG tablet Take 1 tablet by mouth in the am and 2 tablets by mouth in the pm 270 tablet 3  . celecoxib (CELEBREX) 200 MG capsule Take 200 mg by mouth daily as needed for moderate pain.     . Cholecalciferol (VITAMIN D) 1000 UNITS capsule Take 1,000 Units by mouth daily.      . niacin 250 MG CR capsule Take 250 mg by mouth daily.      . Omega-3 Fatty Acids (FISH OIL PO) Take 2,400 mg by mouth daily.     . ramipril (ALTACE) 10 MG capsule Take 1 capsule (10 mg total) by mouth daily. 90 capsule 3  . SUMAtriptan (IMITREX) 100 MG tablet Take 1 tablet (100 mg total) by mouth every 2 (two) hours as needed for migraine or headache. May repeat in 2 hours if headache persists or recurs. 10 tablet 5   No current facility-administered medications for this visit.     Past Medical History  Diagnosis Date  . Hypertension     Borderline  . Degeneration of lumbar intervertebral disc   . GERD (gastroesophageal reflux disease)   . Ischemic heart disease     s/p MI, CABG 2000, s/p cath 2002 with 4/5 grafts patent (LIMA to LAD atretic and occluded mid vessel but adequate flow to distal LAD from  collaterals / diagonal  . Low back pain   . Peyronie's disease   . PAC (premature atrial contraction)   . PAF (paroxysmal atrial fibrillation) ([facility])     initially diagnosed during stress test 2002  . Coronary artery disease     CABG x5 in 2000; s/p cath 2002 > 4/5 grafts patent, LVEF 50%  . CHF (congestive heart failure) (HCC)     LVEF at time of cath 2002 50%; now has been 35-40% since 2008 (patient has seen multiple cardiologists over the years including [facility], [place], [facility], [facility] and Keith Eth, MD)  . Atrial flutter ([place])   . GI bleed     duodenal ulcer    ROS:   All systems reviewed and negative except as noted in the HPI.   Past Surgical History  Procedure Laterality Date  . Cardiac catheterization  09/14/2001     Mildly decreased left ventricular systolic function --  Native three vessel coronary artery disease as described. -- Status post coronary artery bypass grafting  . Hernia repair  05/16/2001    Large left indirect inguinal hernia with right direct hernia.  Marland Kitchen Hernia repair      Recurrent left inguinal hernia -- Large left indirect inguinal hernia with right direct hernia.  Marland Kitchen  Coronary artery bypass graft  2000    LIMA to LAD, SVG to second diagonal, seq SVG to ramus and OM, SVG to PDA  . Ablation  05-06-2014    PVI and CTI ablation by Dr Keith Vazquez  . Tee without cardioversion N/A 05/06/2014    Procedure: TRANSESOPHAGEAL ECHOCARDIOGRAM (TEE);  Surgeon: Keith Spark, MD;  Location: [place];  Service: Cardiovascular;  Laterality: N/A;  . Left heart catheterization with coronary/graft angiogram  11/26/2013    Procedure: LEFT HEART CATHETERIZATION WITH Keith Vazquez;  Surgeon: Keith M Martinique, MD;  Location: [facility] CATH LAB;  Service: Cardiovascular;;  . Atrial fibrillation ablation N/A 05/06/2014    Procedure: ATRIAL FIBRILLATION ABLATION;  Surgeon: [name], MD;  Location: [place] CATH LAB;  Service: Cardiovascular;   Laterality: N/A;     Family History  Problem Relation Age of Onset  . Adopted: Yes  . Family history unknown: Yes     Social History   Social History  . Marital Status: Single    Spouse Name: N/A  . Number of Children: 0  . Years of Education: N/A   Occupational History  . garage owner/corporate pilot    Social History Main Topics  . Smoking status: Former Smoker -- 1.00 packs/day for 20 years    Types: Cigarettes    Quit date: 10/31/1994  . Smokeless tobacco: Never Used  . Alcohol Use: No  . Drug Use: No  . Sexual Activity: Not on file   Other Topics Concern  . Not on file   Social History Narrative   Lives in [place].  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  Also flies airplanes     BP 120/78 mmHg  Pulse 76  Ht 6\' 1"  (1.854 m)  Wt 198 lb 6.4 oz (89.994 kg)  BMI 26.18 kg/m2  Physical Exam:  Well appearing 65 yo man, NAD HEENT: Unremarkable Neck:  6 cm JVD, no thyromegally Back:  No CVA tenderness Lungs:  Clear with no wheezes HEART:  Regular rate rhythm, no murmurs, no rubs, no clicks Abd:  soft, positive bowel sounds, no organomegally, no rebound, no guarding Ext:  2 plus pulses, no edema, no cyanosis, no clubbing Skin:  No rashes no nodules Neuro:  CN II through XII intact, motor grossly intact  EKG - sinus bradycardia with PAC's and PVC's.   Assess/Plan: 1. Chronic systolic heart failure - his symptoms appear to be class 1. He would like to have his echo rechecked. He will maintain a low sodium diet and continue to exercise. 2. HTN - his blood pressure has been on the low side. Will follow. His dose of coreg has been decreased. 3. CAD - he is now 16 years out from his CABG. He denies anginal symptoms. 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 4. Atrial fib - he is s/p catheter ablation. He is doing well and denies any symptoms.  Keith Vazquez.D.

## 2016-04-12 ENCOUNTER — Encounter: Payer: Self-pay | Admitting: Internal Medicine

## 2016-04-21 ENCOUNTER — Other Ambulatory Visit: Payer: Self-pay

## 2016-04-21 ENCOUNTER — Ambulatory Visit (HOSPITAL_COMMUNITY): Payer: Medicare Other | Attending: Cardiology

## 2016-04-21 ENCOUNTER — Ambulatory Visit (INDEPENDENT_AMBULATORY_CARE_PROVIDER_SITE_OTHER): Payer: Medicare Other

## 2016-04-21 DIAGNOSIS — I77810 Thoracic aortic ectasia: Secondary | ICD-10-CM | POA: Insufficient documentation

## 2016-04-21 DIAGNOSIS — R29898 Other symptoms and signs involving the musculoskeletal system: Secondary | ICD-10-CM | POA: Insufficient documentation

## 2016-04-21 DIAGNOSIS — I480 Paroxysmal atrial fibrillation: Secondary | ICD-10-CM | POA: Diagnosis not present

## 2016-04-21 DIAGNOSIS — I2510 Atherosclerotic heart disease of native coronary artery without angina pectoris: Secondary | ICD-10-CM | POA: Diagnosis not present

## 2016-04-21 DIAGNOSIS — I2583 Coronary atherosclerosis due to lipid rich plaque: Principal | ICD-10-CM

## 2016-04-21 DIAGNOSIS — I110 Hypertensive heart disease with heart failure: Secondary | ICD-10-CM | POA: Insufficient documentation

## 2016-04-21 DIAGNOSIS — I340 Nonrheumatic mitral (valve) insufficiency: Secondary | ICD-10-CM | POA: Diagnosis not present

## 2016-04-21 DIAGNOSIS — I509 Heart failure, unspecified: Secondary | ICD-10-CM | POA: Insufficient documentation

## 2016-04-21 DIAGNOSIS — I4891 Unspecified atrial fibrillation: Secondary | ICD-10-CM | POA: Diagnosis present

## 2016-04-21 LAB — EXERCISE TOLERANCE TEST
CHL CUP RESTING HR STRESS: 79 {beats}/min
CSEPHR: 102 %
Estimated workload: 12.2 METS
Exercise duration (min): 10 min
Exercise duration (sec): 18 s
MPHR: 153 {beats}/min
Peak HR: 157 {beats}/min

## 2016-04-21 LAB — ECHOCARDIOGRAM COMPLETE
CHL CUP DOP CALC LVOT VTI: 17.8 cm
CHL CUP TV REG PEAK VELOCITY: 201 cm/s
E/e' ratio: 3.72
EWDT: 303 ms
FS: 23 % — AB (ref 28–44)
IV/PV OW: 0.74
LA diam end sys: 40 mm
LA diam index: 1.87 cm/m2
LA vol A4C: 40 ml
LA vol index: 23.4 mL/m2
LASIZE: 40 mm
LAVOL: 50 mL
LV E/e' medial: 3.72
LV TDI E'LATERAL: 9.54
LV e' LATERAL: 9.54 cm/s
LVEEAVG: 3.72
LVOT SV: 68 mL
LVOT area: 3.8 cm2
LVOT diameter: 22 mm
LVOT peak vel: 71.4 cm/s
MV Dec: 303
MV pk A vel: 69.1 m/s
MVPKEVEL: 35.5 m/s
PW: 12.1 mm — AB (ref 0.6–1.1)
TDI e' medial: 5.26
TRMAXVEL: 201 cm/s

## 2016-05-21 ENCOUNTER — Encounter: Payer: Self-pay | Admitting: Family Medicine

## 2016-05-24 DIAGNOSIS — M47816 Spondylosis without myelopathy or radiculopathy, lumbar region: Secondary | ICD-10-CM | POA: Diagnosis not present

## 2016-05-24 DIAGNOSIS — Z79891 Long term (current) use of opiate analgesic: Secondary | ICD-10-CM | POA: Diagnosis not present

## 2016-05-24 DIAGNOSIS — G894 Chronic pain syndrome: Secondary | ICD-10-CM | POA: Diagnosis not present

## 2016-06-06 ENCOUNTER — Other Ambulatory Visit: Payer: Self-pay | Admitting: Internal Medicine

## 2016-06-07 ENCOUNTER — Other Ambulatory Visit: Payer: Self-pay | Admitting: *Deleted

## 2016-06-13 DIAGNOSIS — M47816 Spondylosis without myelopathy or radiculopathy, lumbar region: Secondary | ICD-10-CM | POA: Diagnosis not present

## 2016-07-18 DIAGNOSIS — M47816 Spondylosis without myelopathy or radiculopathy, lumbar region: Secondary | ICD-10-CM | POA: Diagnosis not present

## 2016-08-03 DIAGNOSIS — H52211 Irregular astigmatism, right eye: Secondary | ICD-10-CM | POA: Diagnosis not present

## 2016-08-03 DIAGNOSIS — H5213 Myopia, bilateral: Secondary | ICD-10-CM | POA: Diagnosis not present

## 2016-08-03 DIAGNOSIS — H2513 Age-related nuclear cataract, bilateral: Secondary | ICD-10-CM | POA: Diagnosis not present

## 2016-08-03 DIAGNOSIS — H40013 Open angle with borderline findings, low risk, bilateral: Secondary | ICD-10-CM | POA: Diagnosis not present

## 2016-08-03 DIAGNOSIS — H524 Presbyopia: Secondary | ICD-10-CM | POA: Diagnosis not present

## 2016-08-03 DIAGNOSIS — H5359 Other color vision deficiencies: Secondary | ICD-10-CM | POA: Diagnosis not present

## 2016-09-16 ENCOUNTER — Ambulatory Visit (INDEPENDENT_AMBULATORY_CARE_PROVIDER_SITE_OTHER): Payer: Medicare Other

## 2016-09-16 DIAGNOSIS — Z23 Encounter for immunization: Secondary | ICD-10-CM | POA: Diagnosis not present

## 2016-10-17 ENCOUNTER — Telehealth: Payer: Self-pay | Admitting: Internal Medicine

## 2016-10-17 NOTE — Telephone Encounter (Signed)
Attempted to call client to schedule awv appt. Patient's phone was busy. Will try to contact patient at a later date.

## 2016-11-10 ENCOUNTER — Telehealth: Payer: Self-pay | Admitting: Internal Medicine

## 2016-11-10 NOTE — Telephone Encounter (Signed)
New Message:   Pt would like to double the dose of his beta blocker please.

## 2016-11-10 NOTE — Telephone Encounter (Signed)
Called, spoke with pt. Pt stated he needs refill of Carvedilol 6.25 mg three times daily. Pt stated he needs this dose and has been taking this dose. Pt started taking increased dose when the manufacturer of this medication changed. Informed pt needs to make 6 month f/u with Dr. Lovena Le. I offered to schedule pt for f/u appt - pt declined. Pt stated he only wants to see Dr. [name]. Infomed I will forward to Dr. Lovena Le to advise.

## 2016-11-13 NOTE — Telephone Encounter (Signed)
Dr Lovena Le and I have both seen previously I am happy to see if her would like.

## 2016-11-14 ENCOUNTER — Encounter: Payer: Self-pay | Admitting: *Deleted

## 2016-11-15 NOTE — Telephone Encounter (Signed)
He is welcome to see Dr. Greggory Brandy. Thanks. GT

## 2016-11-17 ENCOUNTER — Ambulatory Visit: Payer: Medicare Other | Admitting: Internal Medicine

## 2016-11-25 ENCOUNTER — Ambulatory Visit (INDEPENDENT_AMBULATORY_CARE_PROVIDER_SITE_OTHER): Payer: Medicare Other | Admitting: Internal Medicine

## 2016-11-25 VITALS — BP 134/74 | HR 63 | Ht 73.0 in | Wt 202.0 lb

## 2016-11-25 DIAGNOSIS — I2510 Atherosclerotic heart disease of native coronary artery without angina pectoris: Secondary | ICD-10-CM

## 2016-11-25 DIAGNOSIS — I5022 Chronic systolic (congestive) heart failure: Secondary | ICD-10-CM

## 2016-11-25 DIAGNOSIS — I2583 Coronary atherosclerosis due to lipid rich plaque: Secondary | ICD-10-CM | POA: Diagnosis not present

## 2016-11-25 DIAGNOSIS — I480 Paroxysmal atrial fibrillation: Secondary | ICD-10-CM | POA: Diagnosis not present

## 2016-11-25 MED ORDER — SACUBITRIL-VALSARTAN 24-26 MG PO TABS: 1.0000 | ORAL_TABLET | Freq: Two times a day (BID) | ORAL | 6 refills | Status: DC

## 2016-11-25 MED ORDER — CARVEDILOL 6.25 MG PO TABS: 6.2500 mg | ORAL_TABLET | Freq: Two times a day (BID) | ORAL | 3 refills | Status: DC

## 2016-11-25 NOTE — Patient Instructions (Addendum)
Medication Instructions:  Your physician has recommended you make the following change in your medication:  1) Stop Altace 2) Start Entresto 24/26 mg twice daily on Sun 3) Increase Carvedilol to 6.25 mg twice daily    Labwork: None ordered   Testing/Procedures: None ordered   Follow-Up: Your physician recommends that you schedule a follow-up appointment in: 10 days with Fuller Canada, Pharm D for medication titration ----Trinidad Curet have been referred to Dr Sherri Sear cardiology  Your physician wants you to follow-up in: 12 months with Dr Vallery Ridge will receive a reminder letter in the mail two months in advance. If you don't receive a letter, please call our office to schedule the follow-up appointment.   Any Other Special Instructions Will Be Listed Below (If Applicable).     If you need a refill on your cardiac medications before your next appointment, please call your pharmacy.

## 2016-11-25 NOTE — Progress Notes (Signed)
Primary Care Physician: Scarlette Calico, MD Referring Physician:  Dr Etheleen Nicks is a 66 y.o. male with a h/o paroxysmal atrial fibrillation and atrial flutter who presents today for follow-up post afib ablation 05/05/14.  He has had no afib post ablation. He appears to be doing well.  He has an ischemic CM with chronic systolic dysfunction.  He has occasional orthopnea and SOB.  Today, he denies symptoms of  chest pain,  lower extremity edema, dizziness, presyncope, syncope, or neurologic sequela. The patient is tolerating medications without difficulties and is otherwise without complaint today.   Past Medical History:  Diagnosis Date  . Atrial flutter ([place])   . CHF (congestive heart failure) (HCC)    LVEF at time of cath 2002 50%; now has been 35-40% since 2008 (patient has seen multiple cardiologists over the years including [facility], [place], [place] [facility], [facility] and Tollie Eth, MD)  . Coronary artery disease    CABG x5 in 2000; s/p cath 2002 > 4/5 grafts patent, LVEF 50%  . Degeneration of lumbar intervertebral disc   . GERD (gastroesophageal reflux disease)   . GI bleed    duodenal ulcer  . Hypertension    Borderline  . Ischemic heart disease    s/p MI, CABG 2000, s/p cath 2002 with 4/5 grafts patent (LIMA to LAD atretic and occluded mid vessel but adequate flow to distal LAD from collaterals / diagonal  . Low back pain   . PAC (premature atrial contraction)   . PAF (paroxysmal atrial fibrillation) ([place])    initially diagnosed during stress test 2002  . Peyronie's disease    Past Surgical History:  Procedure Laterality Date  . ABLATION  05-06-2014   PVI and CTI ablation by Dr [name]  . ATRIAL FIBRILLATION ABLATION N/A 05/06/2014   Procedure: ATRIAL FIBRILLATION ABLATION;  Surgeon: [name], MD;  Location: [place] CATH LAB;  Service: Cardiovascular;  Laterality: N/A;  . CARDIAC CATHETERIZATION  09/14/2001    Mildly decreased left  ventricular systolic function --  Native three vessel coronary artery disease as described. -- Status post coronary artery bypass grafting  . CORONARY ARTERY BYPASS GRAFT  2000   LIMA to LAD, SVG to second diagonal, seq SVG to ramus and OM, SVG to PDA  . HERNIA REPAIR  05/16/2001   Large left indirect inguinal hernia with right direct hernia.  Marland Kitchen HERNIA REPAIR     Recurrent left inguinal hernia -- Large left indirect inguinal hernia with right direct hernia.  Marland Kitchen LEFT HEART CATHETERIZATION WITH CORONARY/GRAFT ANGIOGRAM  11/26/2013   Procedure: LEFT HEART CATHETERIZATION WITH [name];  Surgeon: Peter M Martinique, MD;  Location: [facility] CATH LAB;  Service: Cardiovascular;;  . TEE WITHOUT CARDIOVERSION N/A 05/06/2014   Procedure: TRANSESOPHAGEAL ECHOCARDIOGRAM (TEE);  Surgeon: [name], MD;  [facility] ENDOSCOPY;  Service: Cardiovascular;  Laterality: N/A;    Current Outpatient Prescriptions  Medication Sig Dispense Refill  . Ascorbic Acid (VITAMIN C) 100 MG tablet Take 500 mg by mouth daily.     Marland Kitchen b complex vitamins capsule Take 1 capsule by mouth daily.      . carvedilol (COREG) 3.125 MG tablet Take 1 tablet by mouth in the am and 2 tablets by mouth in the pm 270 tablet 3  . celecoxib (CELEBREX) 200 MG capsule Take 200 mg by mouth daily as needed for moderate pain.     . Cholecalciferol (VITAMIN D) 1000 UNITS capsule Take 1,000 Units by mouth  daily.      . niacin 250 MG CR capsule Take 250 mg by mouth daily.      . Omega-3 Fatty Acids (FISH OIL PO) Take 2,400 mg by mouth daily.     . ramipril (ALTACE) 10 MG capsule TAKE 1 CAPSULE DAILY. 90 capsule 3  . SUMAtriptan (IMITREX) 100 MG tablet Take 1 tablet (100 mg total) by mouth every 2 (two) hours as needed for migraine or headache. May repeat in 2 hours if headache persists or recurs. 10 tablet 5   No current facility-administered medications for this visit.     Allergies  Allergen Reactions  . Amiodarone Other (See Comments)     Left permanent scar on eye  . Oxycodone-Acetaminophen Nausea Only  . Propoxyphene N-Acetaminophen Nausea Only    Social History   Social History  . Marital status: Single    Spouse name: N/A  . Number of children: 0  . Years of education: N/A   Occupational History  . garage owner/corporate pilot    Social History Main Topics  . Smoking status: Former Smoker    Packs/day: 1.00    Years: 20.00    Types: Cigarettes    Quit date: 10/31/1994  . Smokeless tobacco: Never Used  . Alcohol use No  . Drug use: No  . Sexual activity: Not on file   Other Topics Concern  . Not on file   Social History Narrative   Lives in [place].  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 Adopted: Yes  . Family history unknown: Yes    ROS- All systems are reviewed and negative except as per the HPI above  Physical Exam: Vitals:   11/25/16 1628  BP: 134/74  Pulse: 63  Weight: 202 lb (91.6 kg)  Height: 6\' 1"  (1.854 m)    GEN- The patient is well appearing, alert and oriented x 3 today.   Head- normocephalic, atraumatic Eyes-  Sclera clear, conjunctiva pink Ears- hearing intact Oropharynx- clear Neck- supple,  Lungs- Clear to ausculation bilaterally, normal work of breathing Heart- Regular rate and rhythm, no murmurs, rubs or gallops, PMI not laterally displaced GI- soft, NT, ND, + BS Extremities- no clubbing, cyanosis, or edema MS- no significant deformity or atrophy Neuro- strength and sensation are intact  EKG today reveals sinus rhythm , LVH. Septal infarct, inferior infarct unchanged from prior ekg  Assessment and Plan:  1. afib and atrial flutter Doing well post ablation without recurrence off of amiodarone. No changes today Continue long term anticoagulation for chads2vasc score of 3.  2. CAD/ ischemic CM/ H/o Bypass Doing reasonably well but with occasional orthpnea/ SOB (NYHA Class  II) Will stop ace inhibitor In 36 hours, start entresto at low dose Return in 2 weeks for uptitration of entresto by pharmacy team and bmet at that time Refer to General Cardiology (Dr Stanford Breed)  Return to see me in 1 year Establish with Dr Stanford Breed for general cardiology consultation  Thompson Grayer MD, [facility] 11/25/2016 6:44 PM

## 2016-11-29 ENCOUNTER — Ambulatory Visit (INDEPENDENT_AMBULATORY_CARE_PROVIDER_SITE_OTHER): Payer: Medicare Other | Admitting: Internal Medicine

## 2016-11-29 ENCOUNTER — Encounter: Payer: Self-pay | Admitting: Internal Medicine

## 2016-11-29 ENCOUNTER — Other Ambulatory Visit: Payer: Medicare Other

## 2016-11-29 VITALS — BP 110/80 | HR 71 | Temp 97.8°F | Resp 16 | Ht 76.0 in | Wt 200.0 lb

## 2016-11-29 DIAGNOSIS — I2510 Atherosclerotic heart disease of native coronary artery without angina pectoris: Secondary | ICD-10-CM

## 2016-11-29 DIAGNOSIS — G43001 Migraine without aura, not intractable, with status migrainosus: Secondary | ICD-10-CM | POA: Diagnosis not present

## 2016-11-29 DIAGNOSIS — N401 Enlarged prostate with lower urinary tract symptoms: Secondary | ICD-10-CM

## 2016-11-29 DIAGNOSIS — N5201 Erectile dysfunction due to arterial insufficiency: Secondary | ICD-10-CM | POA: Diagnosis not present

## 2016-11-29 DIAGNOSIS — E876 Hypokalemia: Secondary | ICD-10-CM

## 2016-11-29 DIAGNOSIS — I2583 Coronary atherosclerosis due to lipid rich plaque: Secondary | ICD-10-CM

## 2016-11-29 DIAGNOSIS — Z0000 Encounter for general adult medical examination without abnormal findings: Secondary | ICD-10-CM

## 2016-11-29 DIAGNOSIS — R05 Cough: Secondary | ICD-10-CM | POA: Diagnosis not present

## 2016-11-29 DIAGNOSIS — E785 Hyperlipidemia, unspecified: Secondary | ICD-10-CM

## 2016-11-29 DIAGNOSIS — R053 Chronic cough: Secondary | ICD-10-CM | POA: Insufficient documentation

## 2016-11-29 DIAGNOSIS — I5022 Chronic systolic (congestive) heart failure: Secondary | ICD-10-CM

## 2016-11-29 MED ORDER — TADALAFIL 20 MG PO TABS: 20.0000 mg | ORAL_TABLET | Freq: Every day | ORAL | 11 refills | Status: DC | PRN

## 2016-11-29 MED ORDER — PROMETHAZINE-DM 6.25-15 MG/5ML PO SYRP: 5.0000 mL | ORAL_SOLUTION | Freq: Four times a day (QID) | ORAL | 0 refills | Status: DC | PRN

## 2016-11-29 MED ORDER — SUMATRIPTAN SUCCINATE 100 MG PO TABS: 100.0000 mg | ORAL_TABLET | ORAL | 5 refills | Status: DC | PRN

## 2016-11-29 NOTE — Patient Instructions (Signed)

## 2016-11-29 NOTE — Progress Notes (Signed)
Pre visit review using our clinic review tool, if applicable. No additional management support is needed unless otherwise documented below in the visit note. 

## 2016-11-29 NOTE — Progress Notes (Signed)
Subjective:  Patient ID: Keith Vazquez, male    DOB: 1951/02/26  Age: 66 y.o. MRN: [id number]  CC: Cough; Annual Exam; Hyperlipidemia; Atrial Fibrillation; and Coronary Artery Disease   HPI [name] presents for an AWV/CPX.  He has a history of ischemic cardiomyopathy. Fortunately, he feels well and his had no recent episodes of chest pain, shortness of breath, edema, or palpitations. He wants to be referred to a new cardiologist.  He also some complains of chronic, intermittent nonproductive cough.  His headaches are well-controlled with the occasional dose of Imitrex.  Past Medical History:  Diagnosis Date  . Atrial flutter ([place])   . CHF (congestive heart failure) (HCC)    LVEF at time of cath 2002 50%; now has been 35-40% since 2008 (patient has seen multiple cardiologists over the years including [facility], [place], [place] [facility], [facility] and Tollie Eth, MD)  . Coronary artery disease    CABG x5 in 2000; s/p cath 2002 > 4/5 grafts patent, LVEF 50%  . Degeneration of lumbar intervertebral disc   . GERD (gastroesophageal reflux disease)   . GI bleed    duodenal ulcer  . Hypertension    Borderline  . Ischemic heart disease    s/p MI, CABG 2000, s/p cath 2002 with 4/5 grafts patent (LIMA to LAD atretic and occluded mid vessel but adequate flow to distal LAD from collaterals / diagonal  . Low back pain   . PAC (premature atrial contraction)   . PAF (paroxysmal atrial fibrillation) ([place])    initially diagnosed during stress test 2002  . Peyronie's disease    Past Surgical History:  Procedure Laterality Date  . ABLATION  05-06-2014   PVI and CTI ablation by Dr [name]  . ATRIAL FIBRILLATION ABLATION N/A 05/06/2014   Procedure: ATRIAL FIBRILLATION ABLATION;  Surgeon: [name], MD;  Location: [place] CATH LAB;  Service: Cardiovascular;  Laterality: N/A;  . CARDIAC CATHETERIZATION  09/14/2001    Mildly decreased left ventricular systolic  function --  Native three vessel coronary artery disease as described. -- Status post coronary artery bypass grafting  . CORONARY ARTERY BYPASS GRAFT  2000   LIMA to LAD, SVG to second diagonal, seq SVG to ramus and OM, SVG to PDA  . HERNIA REPAIR  05/16/2001   Large left indirect inguinal hernia with right direct hernia.  Marland Kitchen HERNIA REPAIR     Recurrent left inguinal hernia -- Large left indirect inguinal hernia with right direct hernia.  Marland Kitchen LEFT HEART CATHETERIZATION WITH CORONARY/GRAFT ANGIOGRAM  11/26/2013   Procedure: LEFT HEART CATHETERIZATION WITH [name];  Surgeon: Peter M Martinique, MD;  Location: [facility] CATH LAB;  Service: Cardiovascular;;  . TEE WITHOUT CARDIOVERSION N/A 05/06/2014   Procedure: TRANSESOPHAGEAL ECHOCARDIOGRAM (TEE);  Surgeon: [name], MD;  Location: [facility] ENDOSCOPY;  Service: Cardiovascular;  Laterality: N/A;    reports that he quit smoking about 22 years ago. His smoking use included Cigarettes. He has a 20.00 pack-year smoking history. He has never used smokeless tobacco. He reports that he does not drink alcohol or use drugs. He was adopted. Family history is unknown by patient. Allergies  Allergen Reactions  . Amiodarone Other (See Comments)    Left permanent scar on eye  . Oxycodone-Acetaminophen Nausea Only  . Propoxyphene N-Acetaminophen Nausea Only    Outpatient Medications Prior to Visit  Medication Sig Dispense Refill  . Ascorbic Acid (VITAMIN C) 100 MG tablet Take 500 mg by mouth daily.     Marland Kitchen  b complex vitamins capsule Take 1 capsule by mouth daily.      . carvedilol (COREG) 6.25 MG tablet Take 1 tablet (6.25 mg total) by mouth 2 (two) times daily with a meal. 180 tablet 3  . celecoxib (CELEBREX) 200 MG capsule Take 200 mg by mouth daily as needed for moderate pain.     . Cholecalciferol (VITAMIN D) 1000 UNITS capsule Take 1,000 Units by mouth daily.      . niacin 250 MG CR capsule Take 250 mg by mouth daily.      . Omega-3 Fatty Acids  (FISH OIL PO) Take 2,400 mg by mouth daily.     . sacubitril-valsartan (ENTRESTO) 24-26 MG Take 1 tablet by mouth 2 (two) times daily. 60 tablet 6  . SUMAtriptan (IMITREX) 100 MG tablet Take 1 tablet (100 mg total) by mouth every 2 (two) hours as needed for migraine or headache. May repeat in 2 hours if headache persists or recurs. 10 tablet 5   No facility-administered medications prior to visit.     ROS Review of Systems  Constitutional: Negative.  Negative for activity change, chills, diaphoresis, fatigue and unexpected weight change.  HENT: Negative.  Negative for trouble swallowing.   Eyes: Negative for visual disturbance.  Respiratory: Positive for cough. Negative for chest tightness, shortness of breath, wheezing and stridor.   Cardiovascular: Negative for chest pain, palpitations and leg swelling.  Gastrointestinal: Negative for abdominal pain, constipation, diarrhea, nausea and vomiting.  Endocrine: Negative.   Genitourinary: Negative.  Negative for difficulty urinating, flank pain, frequency, penile swelling, scrotal swelling, testicular pain and urgency.  Musculoskeletal: Negative.  Negative for back pain and myalgias.  Skin: Negative.   Allergic/Immunologic: Negative.   Neurological: Negative.   Hematological: Negative for adenopathy. Does not bruise/bleed easily.  Psychiatric/Behavioral: Negative.     Objective:  BP 110/80   Pulse 71   Temp 97.8 F (36.6 C) (Oral)   Resp 16   Ht 6\' 4"  (1.93 m)   Wt 200 lb (90.7 kg)   SpO2 96%   BMI 24.34 kg/m   BP Readings from Last 3 Encounters:  11/29/16 110/80  11/25/16 134/74  04/01/16 120/78    Wt Readings from Last 3 Encounters:  11/29/16 200 lb (90.7 kg)  11/25/16 202 lb (91.6 kg)  04/01/16 198 lb 6.4 oz (90 kg)    Physical Exam  Constitutional: He is oriented to person, place, and time. No distress.  HENT:  Mouth/Throat: Oropharynx is clear and moist. No oropharyngeal exudate.  Eyes: Conjunctivae are normal.  Right eye exhibits no discharge. Left eye exhibits no discharge. No scleral icterus.  Neck: Normal range of motion. Neck supple. No JVD present. No tracheal deviation present. No thyromegaly present.  Cardiovascular: Normal rate, regular rhythm, normal heart sounds and intact distal pulses.  Exam reveals no gallop and no friction rub.   No murmur heard. Pulmonary/Chest: Effort normal and breath sounds normal. No stridor. No respiratory distress. He has no wheezes. He has no rales. He exhibits no tenderness.  Abdominal: Soft. Bowel sounds are normal. He exhibits no distension and no mass. There is no tenderness. There is no rebound and no guarding. Hernia confirmed negative in the right inguinal area and confirmed negative in the left inguinal area.  Genitourinary: Rectum normal, testes normal and penis normal. Rectal exam shows no external hemorrhoid, no internal hemorrhoid, no fissure, no mass, no tenderness, anal tone normal and guaiac negative stool. Prostate is enlarged (1+ smooth symm BPH). Prostate is  not tender. Right testis shows no mass, no swelling and no tenderness. Right testis is descended. Left testis shows no mass, no swelling and no tenderness. Left testis is descended. Uncircumcised. No phimosis, paraphimosis, hypospadias, penile erythema or penile tenderness. No discharge found.  Musculoskeletal: Normal range of motion. He exhibits no edema, tenderness or deformity.  Lymphadenopathy:    He has no cervical adenopathy.       Right: No inguinal adenopathy present.       Left: No inguinal adenopathy present.  Neurological: He is oriented to person, place, and time.  Skin: Skin is warm and dry. No rash noted. He is not diaphoretic. No erythema. No pallor.  Psychiatric: He has a normal mood and affect. His behavior is normal. Judgment and thought content normal.  Vitals reviewed.   Lab Results  Component Value Date   WBC 7.3 11/30/2016   HGB 16.5 11/30/2016   HCT 48.4 11/30/2016    PLT 157.0 11/30/2016   GLUCOSE 111 (H) 11/30/2016   CHOL 181 11/30/2016   TRIG 162.0 (H) 11/30/2016   HDL 44.20 11/30/2016   LDLDIRECT 120.5 09/30/2013   LDLCALC 104 (H) 11/30/2016   ALT 7 11/30/2016   AST 17 11/30/2016   NA 138 11/30/2016   K 3.4 (L) 11/30/2016   CL 103 11/30/2016   CREATININE 0.95 11/30/2016   BUN 16 11/30/2016   CO2 29 11/30/2016   TSH 1.31 11/30/2016   PSA 0.67 11/30/2016   INR 1.00 11/26/2013    Mr Wrist Right Wo Contrast  Result Date: 06/24/2015 CLINICAL DATA:  Right wrist pain at the base of the thumb with weakness for approximately 1 year. No known injury. EXAM: MR OF THE RIGHT WRIST WITHOUT CONTRAST TECHNIQUE: Multiplanar, multisequence MR imaging of the right wrist was performed. No intravenous contrast was administered. COMPARISON:  Plain films of the right wrist 03/20/2015. FINDINGS: Ligaments: The scapholunate ligament including the dorsal band is torn. The lunotriquetral ligament is intact. Triangular fibrocartilage: The disc of the triangular fibrocartilage is degenerated and but no discrete tear is seen. Tendons: Intact. A fluid collection measuring approximately 1.3 cm transverse by 0.4 cm AP by 0.9 cm craniocaudal is seen deep to the extensor carpi ulnaris at the level of the distal ulna most consistent with a ganglion cyst or adventitial bursa. Carpal tunnel/median nerve: Unremarkable. Guyon's canal: Unremarkable. Joint/cartilage: Degenerative disease is seen about the wrist with hyaline cartilage loss appearing worst about the scaphoid trapezium trapezoid and first CMC joints. Hyaline cartilage loss at the radiocarpal joint is also identified. Bones/carpal alignment: There is abnormal dorsal tilt of the lunate consistent with DISI. Subchondral edema is seen about the STT joint. A milder degree of subchondral edema seen at the radiocarpal joint. Other: None. IMPRESSION: Scapholunate ligament tear involves the dorsal band. There is associated DISI.  Degenerative disease about the wrist most notable at the STT and radiocarpal joints. Degenerated triangular fibrocartilage without tear identified. Small ganglion cyst versus adventitial bursa deep to the extensor carpi ulnaris tendon at the level of the distal ulna. Electronically Signed   By: Inge Rise M.D.   On: 06/24/2015 07:47    Assessment & Plan:   Prithvi was seen today for cough, annual exam, hyperlipidemia, atrial fibrillation and coronary artery disease.  Diagnoses and all orders for this visit:  Benign prostatic hyperplasia with lower urinary tract symptoms, symptom details unspecified- he has no symptoms that need to be treated and his PSA is normal so I am not concerned about prostate  cancer. -     PSA; Future  Chronic systolic heart failure ([place]) -     Ambulatory referral to Cardiology  Coronary artery disease involving native coronary artery of native heart without angina pectoris- I've asked him to start taking a statin. Will refer to a new cardiologist at his request. -     Ambulatory referral to Cardiology -     Lipid panel; Future  Chronic coughing -     Ambulatory referral to Pulmonology -     promethazine-dextromethorphan (PROMETHAZINE-DM) 6.25-15 MG/5ML syrup; Take 5 mLs by mouth 4 (four) times daily as needed for cough.  Migraine without aura and with status migrainosus, not intractable -     SUMAtriptan (IMITREX) 100 MG tablet; Take 1 tablet (100 mg total) by mouth every 2 (two) hours as needed for migraine or headache. May repeat in 2 hours if headache persists or recurs.  Hyperlipidemia with target low density lipoprotein (LDL) cholesterol less than 70 mg/dL- his Framingham risk was 12% so I've asked him to start taking a statin. -     Lipid panel; Future -     Comprehensive metabolic panel; Future -     CBC with Differential/Platelet; Future -     TSH; Future  Erectile dysfunction due to arterial insufficiency -     Discontinue: tadalafil (CIALIS) 20  MG tablet; Take 1 tablet (20 mg total) by mouth daily as needed for erectile dysfunction. -     sildenafil (REVATIO) 20 MG tablet; Take 5 tablets (100 mg total) by mouth daily as needed.  Hypokalemia -     potassium chloride SA (K-DUR,KLOR-CON) 20 MEQ tablet; Take 1 tablet (20 mEq total) by mouth 2 (two) times daily.   I have discontinued Mr. Scarberry's tadalafil. I am also having him start on promethazine-dextromethorphan, potassium chloride SA, and sildenafil. Additionally, I am having him maintain his b complex vitamins, Vitamin D, Omega-3 Fatty Acids (FISH OIL PO), niacin, celecoxib, vitamin C, carvedilol, sacubitril-valsartan, and SUMAtriptan.  Meds ordered this encounter  Medications  . promethazine-dextromethorphan (PROMETHAZINE-DM) 6.25-15 MG/5ML syrup    Sig: Take 5 mLs by mouth 4 (four) times daily as needed for cough.    Dispense:  118 mL    Refill:  0  . SUMAtriptan (IMITREX) 100 MG tablet    Sig: Take 1 tablet (100 mg total) by mouth every 2 (two) hours as needed for migraine or headache. May repeat in 2 hours if headache persists or recurs.    Dispense:  10 tablet    Refill:  5  . DISCONTD: tadalafil (CIALIS) 20 MG tablet    Sig: Take 1 tablet (20 mg total) by mouth daily as needed for erectile dysfunction.    Dispense:  6 tablet    Refill:  11  . potassium chloride SA (K-DUR,KLOR-CON) 20 MEQ tablet    Sig: Take 1 tablet (20 mEq total) by mouth 2 (two) times daily.    Dispense:  180 tablet    Refill:  1  . sildenafil (REVATIO) 20 MG tablet    Sig: Take 5 tablets (100 mg total) by mouth daily as needed.    Dispense:  100 tablet    Refill:  11   See AVS for instructions about healthy living and anticipatory guidance.  Follow-up: Return if symptoms worsen or fail to improve.  Scarlette Calico, MD

## 2016-11-30 ENCOUNTER — Encounter: Payer: Self-pay | Admitting: Internal Medicine

## 2016-11-30 ENCOUNTER — Other Ambulatory Visit (INDEPENDENT_AMBULATORY_CARE_PROVIDER_SITE_OTHER): Payer: Medicare Other

## 2016-11-30 DIAGNOSIS — E785 Hyperlipidemia, unspecified: Secondary | ICD-10-CM | POA: Diagnosis not present

## 2016-11-30 DIAGNOSIS — N401 Enlarged prostate with lower urinary tract symptoms: Secondary | ICD-10-CM

## 2016-11-30 DIAGNOSIS — I2510 Atherosclerotic heart disease of native coronary artery without angina pectoris: Secondary | ICD-10-CM | POA: Diagnosis not present

## 2016-11-30 DIAGNOSIS — E876 Hypokalemia: Secondary | ICD-10-CM | POA: Insufficient documentation

## 2016-11-30 LAB — PSA: PSA: 0.67 ng/mL (ref 0.10–4.00)

## 2016-11-30 LAB — CBC WITH DIFFERENTIAL/PLATELET
BASOS PCT: 0.5 % (ref 0.0–3.0)
Basophils Absolute: 0 10*3/uL (ref 0.0–0.1)
EOS PCT: 3.4 % (ref 0.0–5.0)
Eosinophils Absolute: 0.2 10*3/uL (ref 0.0–0.7)
HEMATOCRIT: 48.4 % (ref 39.0–52.0)
HEMOGLOBIN: 16.5 g/dL (ref 13.0–17.0)
Lymphocytes Relative: 28.3 % (ref 12.0–46.0)
Lymphs Abs: 2.1 10*3/uL (ref 0.7–4.0)
MCHC: 34 g/dL (ref 30.0–36.0)
MCV: 92.8 fl (ref 78.0–100.0)
MONOS PCT: 7.4 % (ref 3.0–12.0)
Monocytes Absolute: 0.5 10*3/uL (ref 0.1–1.0)
Neutro Abs: 4.4 10*3/uL (ref 1.4–7.7)
Neutrophils Relative %: 60.4 % (ref 43.0–77.0)
Platelets: 157 10*3/uL (ref 150.0–400.0)
RBC: 5.22 Mil/uL (ref 4.22–5.81)
RDW: 13.7 % (ref 11.5–15.5)
WBC: 7.3 10*3/uL (ref 4.0–10.5)

## 2016-11-30 LAB — COMPREHENSIVE METABOLIC PANEL
ALK PHOS: 79 U/L (ref 39–117)
ALT: 7 U/L (ref 0–53)
AST: 17 U/L (ref 0–37)
Albumin: 4.6 g/dL (ref 3.5–5.2)
BUN: 16 mg/dL (ref 6–23)
CHLORIDE: 103 meq/L (ref 96–112)
CO2: 29 mEq/L (ref 19–32)
Calcium: 9.2 mg/dL (ref 8.4–10.5)
Creatinine, Ser: 0.95 mg/dL (ref 0.40–1.50)
GFR: 83.82 mL/min (ref >60.00)
Glucose, Bld: 111 mg/dL — ABNORMAL HIGH (ref 70–99)
POTASSIUM: 3.4 meq/L — AB (ref 3.5–5.1)
Sodium: 138 mEq/L (ref 135–145)
TOTAL PROTEIN: 7.2 g/dL (ref 6.0–8.3)
Total Bilirubin: 0.6 mg/dL (ref 0.2–1.2)

## 2016-11-30 LAB — LIPID PANEL
Cholesterol: 181 mg/dL (ref 0–200)
HDL: 44.2 mg/dL (ref >39.00)
LDL CALC: 104 mg/dL — AB (ref 0–99)
NonHDL: 136.65
Total CHOL/HDL Ratio: 4
Triglycerides: 162 mg/dL — ABNORMAL HIGH (ref 0.0–149.0)
VLDL: 32.4 mg/dL (ref 0.0–40.0)

## 2016-11-30 LAB — TSH: TSH: 1.31 u[IU]/mL (ref 0.35–4.50)

## 2016-11-30 MED ORDER — POTASSIUM CHLORIDE CRYS ER 20 MEQ PO TBCR: 20.0000 meq | EXTENDED_RELEASE_TABLET | Freq: Two times a day (BID) | ORAL | 1 refills | Status: DC

## 2016-12-01 ENCOUNTER — Telehealth: Payer: Self-pay | Admitting: Internal Medicine

## 2016-12-01 ENCOUNTER — Telehealth: Payer: Self-pay | Admitting: *Deleted

## 2016-12-01 MED ORDER — SILDENAFIL CITRATE 20 MG PO TABS: 100.0000 mg | ORAL_TABLET | Freq: Every day | ORAL | 11 refills | Status: DC | PRN

## 2016-12-01 NOTE — Telephone Encounter (Signed)
Message created in error per [place].

## 2016-12-01 NOTE — Telephone Encounter (Signed)
Rec'd fax stating the Cialis will  cost $437 v/s paying $98.99 for sildenafil. Requesting to change rx to sildenafil 20 mg #60 to take 3-5 tabs daily. Pls advise...Johny Chess

## 2016-12-04 ENCOUNTER — Encounter: Payer: Self-pay | Admitting: Internal Medicine

## 2016-12-04 DIAGNOSIS — Z0000 Encounter for general adult medical examination without abnormal findings: Secondary | ICD-10-CM | POA: Insufficient documentation

## 2016-12-04 MED ORDER — ROSUVASTATIN CALCIUM 10 MG PO TABS: 10.0000 mg | ORAL_TABLET | Freq: Every day | ORAL | 1 refills | Status: DC

## 2016-12-04 NOTE — Assessment & Plan Note (Signed)

## 2016-12-05 ENCOUNTER — Ambulatory Visit (INDEPENDENT_AMBULATORY_CARE_PROVIDER_SITE_OTHER): Payer: Medicare Other | Admitting: Pharmacist

## 2016-12-05 VITALS — BP 110/82 | HR 66 | Wt 199.4 lb

## 2016-12-05 DIAGNOSIS — I5022 Chronic systolic (congestive) heart failure: Secondary | ICD-10-CM

## 2016-12-05 DIAGNOSIS — I2510 Atherosclerotic heart disease of native coronary artery without angina pectoris: Secondary | ICD-10-CM

## 2016-12-05 DIAGNOSIS — I2583 Coronary atherosclerosis due to lipid rich plaque: Secondary | ICD-10-CM

## 2016-12-05 DIAGNOSIS — I10 Essential (primary) hypertension: Secondary | ICD-10-CM | POA: Diagnosis not present

## 2016-12-05 NOTE — Progress Notes (Signed)
Patient ID: Keith Vazquez                 DOB: 1951-08-27                      MRN: [id number]     HPI: Keith Vazquez is a 66 y.o. male patient of Dr. Stanford Breed, referred by Dr. [name], with PMH below who presents today for medication titration. He was started on Entresto on 11/25/16 after a 36 hour wash out period of his ACEi.   Light headed for most of the morning on Wednesday and seems to recur each morning, but he feels this is unrelated to his blood pressure as his pressure remains steady as always 110/75 this morning and each time he monitors. He also is complaining about the size of the potassium pills. He would very much like to stop these.   Cardiac Hx: HTN, ischemic cardiomyopathy, Afib, CAD, CHF, HLD  Current HTN meds:  Entresto 24/26mg  BID Carvedilol 6.25mg  in the morning and 12.5mg  in the evening  Diet: Patient eats a very healthy diet and always has. He states there is no replacement for a good diet.   Exercise: Follows a strict workout regimen.   Home BP readings:  Avg home readings 110/78  Wt Readings from Last 3 Encounters:  12/05/16 199 lb 6.4 oz (90.4 kg)  11/29/16 200 lb (90.7 kg)  11/25/16 202 lb (91.6 kg)   BP Readings from Last 3 Encounters:  12/05/16 110/82  11/29/16 110/80  11/25/16 134/74   Pulse Readings from Last 3 Encounters:  12/05/16 66  11/29/16 71  11/25/16 63    Renal function: Estimated Creatinine Clearance: 97.8 mL/min (by C-G formula based on SCr of 0.9 mg/dL).  Past Medical History:  Diagnosis Date  . Atrial flutter ([place])   . CHF (congestive heart failure) (HCC)    LVEF at time of cath 2002 50%; now has been 35-40% since 2008 (patient has seen multiple cardiologists over the years including [facility], [place], [place] [facility], [facility] and Tollie Eth, MD)  . Coronary artery disease    CABG x5 in 2000; s/p cath 2002 > 4/5 grafts patent, LVEF 50%  . Degeneration of lumbar intervertebral disc   . GERD  (gastroesophageal reflux disease)   . GI bleed    duodenal ulcer  . Hypertension    Borderline  . Ischemic heart disease    s/p MI, CABG 2000, s/p cath 2002 with 4/5 grafts patent (LIMA to LAD atretic and occluded mid vessel but adequate flow to distal LAD from collaterals / diagonal  . Low back pain   . PAC (premature atrial contraction)   . PAF (paroxysmal atrial fibrillation) ([place])    initially diagnosed during stress test 2002  . Peyronie's disease     Current Outpatient Prescriptions on File Prior to Visit  Medication Sig Dispense Refill  . Ascorbic Acid (VITAMIN C) 100 MG tablet Take 500 mg by mouth daily.     Marland Kitchen b complex vitamins capsule Take 1 capsule by mouth daily.      . carvedilol (COREG) 6.25 MG tablet Take 1 tablet (6.25 mg total) by mouth 2 (two) times daily with a meal. (Patient taking differently: Take 6.25 mg by mouth 2 (two) times daily with a meal. Take 1 tablet in the morning and 2 tablets each evening) 180 tablet 3  . celecoxib (CELEBREX) 200 MG capsule Take 200 mg by mouth daily as needed for  moderate pain.     . Cholecalciferol (VITAMIN D) 1000 UNITS capsule Take 1,000 Units by mouth daily.      . niacin 250 MG CR capsule Take 250 mg by mouth daily.      . Omega-3 Fatty Acids (FISH OIL PO) Take 2,400 mg by mouth daily.     . potassium chloride SA (K-DUR,KLOR-CON) 20 MEQ tablet Take 1 tablet (20 mEq total) by mouth 2 (two) times daily. 180 tablet 1  . promethazine-dextromethorphan (PROMETHAZINE-DM) 6.25-15 MG/5ML syrup Take 5 mLs by mouth 4 (four) times daily as needed for cough. 118 mL 0  . sacubitril-valsartan (ENTRESTO) 24-26 MG Take 1 tablet by mouth 2 (two) times daily. 60 tablet 6  . SUMAtriptan (IMITREX) 100 MG tablet Take 1 tablet (100 mg total) by mouth every 2 (two) hours as needed for migraine or headache. May repeat in 2 hours if headache persists or recurs. 10 tablet 5  . sildenafil (REVATIO) 20 MG tablet Take 5 tablets (100 mg total) by mouth daily as  needed. (Patient not taking: Reported on 12/05/2016) 100 tablet 11   No current facility-administered medications on file prior to visit.     Allergies  Allergen Reactions  . Amiodarone Other (See Comments)    Left permanent scar on eye  . Oxycodone-Acetaminophen Nausea Only  . Propoxyphene N-Acetaminophen Nausea Only    Blood pressure 110/82, pulse 66, weight 199 lb 6.4 oz (90.4 kg).   Assessment/Plan: Hypertension: BP today within normal range. Will plan to titrate Entresto to mid-dose for max benefit pending lab results. Will repeat BMET and follow up in HTN clinic in 2-3 weeks. I am doubtful that patient's blood pressure will tolerate the high dose.   Spoke with patient and advised labs are WNL. Will proceed with increase Entresto to 49/51mg  BID as discussed yesterday. Will stop his potassium as patient has been choking on pills and potassium now WNL. Follow up as above.    Thank you, Lelan Pons. Patterson Hammersmith, [facility]  12/06/2016 8:10 AM

## 2016-12-05 NOTE — Patient Instructions (Addendum)
Return for a follow up appointment in 3-4 weeks   Check your blood pressure at home daily (if able) and keep record of the readings.  Take your BP meds as follows: We will plan to increase the [facility] pending lab results  Bring all of your meds, your BP cuff and your record of home blood pressures to your next appointment.  Exercise as you're able, try to walk approximately 30 minutes per day.  Keep salt intake to a minimum, especially watch canned and prepared boxed foods.  Eat more fresh fruits and vegetables and fewer canned items.  Avoid eating in fast food restaurants.    HOW TO TAKE YOUR BLOOD PRESSURE: . Rest 5 minutes before taking your blood pressure. .  Don't smoke or drink caffeinated beverages for at least 30 minutes before. . Take your blood pressure before (not after) you eat. . Sit comfortably with your back supported and both feet on the floor (don't cross your legs). . Elevate your arm to heart level on a table or a desk. . Use the proper sized cuff. It should fit smoothly and snugly around your bare upper arm. There should be enough room to slip a fingertip under the cuff. The bottom edge of the cuff should be 1 inch above the crease of the elbow. . Ideally, take 3 measurements at one sitting and record the average.

## 2016-12-06 ENCOUNTER — Encounter: Payer: Self-pay | Admitting: Pharmacist

## 2016-12-06 LAB — BASIC METABOLIC PANEL
BUN/Creatinine Ratio: 16 (ref 10–24)
BUN: 14 mg/dL (ref 8–27)
CALCIUM: 9.4 mg/dL (ref 8.6–10.2)
CO2: 22 mmol/L (ref 18–29)
CREATININE: 0.9 mg/dL (ref 0.76–1.27)
Chloride: 102 mmol/L (ref 96–106)
GFR calc Af Amer: 102 mL/min/{1.73_m2} (ref >59)
GFR, EST NON AFRICAN AMERICAN: 88 mL/min/{1.73_m2} (ref >59)
GLUCOSE: 95 mg/dL (ref 65–99)
Potassium: 4.8 mmol/L (ref 3.5–5.2)
SODIUM: 141 mmol/L (ref 134–144)

## 2016-12-06 MED ORDER — CARVEDILOL 6.25 MG PO TABS: ORAL_TABLET | ORAL | 3 refills | Status: DC

## 2016-12-06 MED ORDER — SACUBITRIL-VALSARTAN 49-51 MG PO TABS: 1.0000 | ORAL_TABLET | Freq: Two times a day (BID) | ORAL | 3 refills | Status: DC

## 2016-12-06 NOTE — Telephone Encounter (Signed)
MD change to [facility] and sent rx to [place]...Keith Vazquez

## 2016-12-09 ENCOUNTER — Ambulatory Visit: Payer: Medicare Other | Admitting: Cardiology

## 2016-12-12 ENCOUNTER — Telehealth: Payer: Self-pay | Admitting: Internal Medicine

## 2016-12-12 NOTE — Telephone Encounter (Signed)
Was talking with pt regarding a referral and he stated he went to the pharmacy to pick up a prescription and was given a bottle of calcium pills. He states he doesn't know anything about having low calcium and feels it was in error. He is returning the pill to the pharmacy.  Just an [place]

## 2016-12-14 NOTE — Telephone Encounter (Signed)
LVM for pt to call back as soon as possible.   

## 2016-12-15 ENCOUNTER — Telehealth: Payer: Self-pay | Admitting: Pharmacist

## 2016-12-15 MED ORDER — SACUBITRIL-VALSARTAN 24-26 MG PO TABS: 1.0000 | ORAL_TABLET | Freq: Two times a day (BID) | ORAL | 3 refills | Status: DC

## 2016-12-15 NOTE — Telephone Encounter (Signed)
Patient BP too low and want to discuss Entresto dose with Keith Vazquez.    OV at [employer] on 12/05/16

## 2016-12-15 NOTE — Telephone Encounter (Signed)
Returned call to patient. He states that since increasing Entresto dosage to the Entresto 49-51mg  BID dose, his BP has dropped to SBP < 100 consistently. He is lightheaded when standing - BP readings 98/50 and 90/58 at home. Will decrease Entresto dose back to the 24-26mg  BID dose. Rx refill sent in. Pt advised to keep his f/u appt next week.

## 2016-12-16 ENCOUNTER — Encounter: Payer: Self-pay | Admitting: Cardiology

## 2016-12-24 NOTE — Progress Notes (Signed)
Cardiology Office Note    Date:  12/26/2016   ID:  Keith Vazquez, DOB May 30, 1951, MRN [id number]  PCP:  Keith Calico, MD  Cardiologist:  Keith Flaming Martinique, MD    History of Present Illness:  Keith Vazquez is a 66 y.o. male seen for follow up CAD. He is followed by Dr. Rayann Vazquez for atrial fibrillation. He is post afib ablation 05/05/14.  He has had no afib post ablation.  He has an ischemic CM with chronic systolic CHF.   EF 40-45% by last Echo in June 2017. He is s/p CABG in 2000. Cardiac caths in 2002 and 2015 showed atretic LIMA to the LAD which filled by the SVG to the second diagonal. The SVG to the second diagonal, SVG sequentially to the ramus and OM, and the SVG to PDA were all patent. Last Myoview study in April 2016 was felt to be low risk. He had an ETT in June 2017 with no ischemia at 13 METs. He also has a history of HTN.  In January of this year he was started on Entresto.    On follow up today patient relates symptoms of awakening with dyspnea at 4-5 am. Has to get up to get comfortable. This has improved significantly since Afib ablation and can only remember about 5x that it has happened since. He has no edema. Is careful with sodium intake. He remains quite active at the gym. He denies any chest pain.   Past Medical History:  Diagnosis Date  . Atrial flutter ([place])   . CHF (congestive heart failure) (HCC)    LVEF at time of cath 2002 50%; now has been 35-40% since 2008 (patient has seen multiple cardiologists over the years including [facility], [place], [facility], [facility] and Tollie Eth, MD)  . Coronary artery disease    CABG x5 in 2000; s/p cath 2002 > 4/5 grafts patent, LVEF 50%  . Degeneration of lumbar intervertebral disc   . GERD (gastroesophageal reflux disease)   . GI bleed    duodenal ulcer  . Hypertension    Borderline  . Ischemic heart disease    s/p MI, CABG 2000, s/p cath 2002 with 4/5 grafts patent (LIMA to LAD atretic and  occluded mid vessel but adequate flow to distal LAD from collaterals / diagonal  . Low back pain   . PAC (premature atrial contraction)   . PAF (paroxysmal atrial fibrillation) ([place])    initially diagnosed during stress test 2002  . Peyronie's disease     Past Surgical History:  Procedure Laterality Date  . ABLATION  05-06-2014   PVI and CTI ablation by Dr Keith Vazquez  . ATRIAL FIBRILLATION ABLATION N/A 05/06/2014   Procedure: ATRIAL FIBRILLATION ABLATION;  Surgeon: [name], MD;  Location: [place] CATH LAB;  Service: Cardiovascular;  Laterality: N/A;  . CARDIAC CATHETERIZATION  09/14/2001    Mildly decreased left ventricular systolic function --  Native three vessel coronary artery disease as described. -- Status post coronary artery bypass grafting  . CORONARY ARTERY BYPASS GRAFT  2000   LIMA to LAD, SVG to second diagonal, seq SVG to ramus and OM, SVG to PDA  . HERNIA REPAIR  05/16/2001   Large left indirect inguinal hernia with right direct hernia.  Marland Kitchen HERNIA REPAIR     Recurrent left inguinal hernia -- Large left indirect inguinal hernia with right direct hernia.  Marland Kitchen LEFT HEART CATHETERIZATION WITH CORONARY/GRAFT ANGIOGRAM  11/26/2013   Procedure: LEFT HEART CATHETERIZATION  WITH Keith Vazquez;  Surgeon: Keith Vazquez M Martinique, MD;  Location[facility] CATH LAB;  Service: Cardiovascular;;  . TEE WITHOUT CARDIOVERSION N/A 05/06/2014   Procedure: TRANSESOPHAGEAL ECHOCARDIOGRAM (TEE);  Surgeon: Keith Spark, MD;  Location: [facility] ENDOSCOPY;  Service: Cardiovascular;  Laterality: N/A;    Current Medications: Outpatient Medications Prior to Visit  Medication Sig Dispense Refill  . Ascorbic Acid (VITAMIN C) 100 MG tablet Take 500 mg by mouth daily.     Marland Kitchen b complex vitamins capsule Take 1 capsule by mouth daily.      . carvedilol (COREG) 6.25 MG tablet Take 1 tablet each morning and 2 tablets each evening. 180 tablet 3  . celecoxib (CELEBREX) 200 MG capsule Take 200 mg by mouth daily as needed for  moderate pain.     . Cholecalciferol (VITAMIN D) 1000 UNITS capsule Take 1,000 Units by mouth daily.      Marland Kitchen co-enzyme Q-10 30 MG capsule Take 100 mg by mouth 3 (three) times daily.    . niacin 250 MG CR capsule Take 250 mg by mouth daily.      . Omega-3 Fatty Acids (FISH OIL PO) Take 2,400 mg by mouth daily.     . potassium chloride SA (K-DUR,KLOR-CON) 20 MEQ tablet Take 1 tablet (20 mEq total) by mouth 2 (two) times daily. 180 tablet 1  . promethazine-dextromethorphan (PROMETHAZINE-DM) 6.25-15 MG/5ML syrup Take 5 mLs by mouth 4 (four) times daily as needed for cough. 118 mL 0  . sacubitril-valsartan (ENTRESTO) 24-26 MG Take 1 tablet by mouth 2 (two) times daily. 180 tablet 3  . sildenafil (REVATIO) 20 MG tablet Take 5 tablets (100 mg total) by mouth daily as needed. (Patient not taking: Reported on 12/05/2016) 100 tablet 11  . SUMAtriptan (IMITREX) 100 MG tablet Take 1 tablet (100 mg total) by mouth every 2 (two) hours as needed for migraine or headache. May repeat in 2 hours if headache persists or recurs. 10 tablet 5  . vitamin E 200 UNIT capsule Take 200 Units by mouth daily.     No facility-administered medications prior to visit.      Allergies:   Amiodarone; Oxycodone-acetaminophen; and Propoxyphene n-acetaminophen   Social History   Social History  . Marital status: Single    Spouse name: N/A  . Number of children: 0  . Years of education: N/A   Occupational History  . garage owner/corporate pilot    Social History Main Topics  . Smoking status: Former Smoker    Packs/day: 1.00    Years: 20.00    Types: Cigarettes    Quit date: 10/31/1994  . Smokeless tobacco: Never Used  . Alcohol use No  . Drug use: No  . Sexual activity: Not Asked   Other Topics Concern  . None   Social History Narrative   Lives in [place].  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  Also flies airplanes     Family History:  The patient's He was adopted. Family history is unknown by  patient.   ROS:   Please see the history of present illness.    ROS All other systems reviewed and are negative.   PHYSICAL EXAM:   VS:  BP 110/78 (BP Location: Right Arm, Patient Position: Sitting, Cuff Size: Large)   Pulse 64   Ht 6\' 2"  (1.88 m)   Wt 200 lb (90.7 kg)   BMI 25.68 kg/m    GEN: Well nourished, well developed, in no acute distress  HEENT: normal  Neck: no JVD, carotid bruits, or masses Cardiac: RRR; no murmurs, rubs, or gallops,no edema  Respiratory:  clear to auscultation bilaterally, normal work of breathing GI: soft, nontender, nondistended, + BS MS: no deformity or atrophy  Skin: warm and dry, no rash Neuro:  Alert and Oriented x 3, Strength and sensation are intact Psych: euthymic mood, full affect  Wt Readings from Last 3 Encounters:  12/26/16 200 lb (90.7 kg)  12/05/16 199 lb 6.4 oz (90.4 kg)  11/29/16 200 lb (90.7 kg)      Studies/Labs Reviewed:   EKG:  EKG isnot ordered today.  The ekg ordered today demonstrates N/A  Recent Labs: 11/30/2016: ALT 7; Hemoglobin 16.5; Platelets 157.0; TSH 1.31 12/05/2016: BUN 14; Creatinine, Ser 0.90; Potassium 4.8; Sodium 141   Lipid Panel    Component Value Date/Time   CHOL 181 11/30/2016 0828   TRIG 162.0 (H) 11/30/2016 0828   HDL 44.20 11/30/2016 0828   CHOLHDL 4 11/30/2016 0828   VLDL 32.4 11/30/2016 0828   LDLCALC 104 (H) 11/30/2016 0828   LDLDIRECT 120.5 09/30/2013 1400    Additional studies/ records that were reviewed today include:  ETT 04/21/16: Study Highlights    There was no ST segment deviation noted during stress.   Ambient PVC;s at rest, with stress and recovery No significant NSVT No ischemia at 13.2 METS and 102% of PMHR   Echo: 04/21/16: Study Conclusions  - Left ventricle: The cavity size was normal. Wall thickness was   normal. Basal to mid inferior akinesis. Basal to mid   inferolateral akinesis. Anterolateral hypokinesis. Systolic   function was mildly to moderately reduced.  The estimated ejection   fraction was in the range of 40% to 45%. Doppler parameters are   consistent with abnormal left ventricular relaxation (grade 1   diastolic dysfunction). - Aortic valve: There was no stenosis. - Aorta: Mildly dilated aortic root. Aortic root dimension: 38 mm   (ED). - Mitral valve: Mildly calcified annulus. There was trivial   regurgitation. - Left atrium: The atrium was mildly dilated. - Right ventricle: The cavity size was normal. Systolic function   was mildly reduced. - Tricuspid valve: Peak RV-RA gradient (S): 16 mm Hg. - Pulmonary arteries: PA peak pressure: 19 mm Hg (S). - Inferior vena cava: The vessel was normal in size. The   respirophasic diameter changes were in the normal range (= 50%),   consistent with normal central venous pressure.  Impressions:  - Normal LV size with EF 40-45%. Wall motion abnormalities as noted   above. Normal RV size with mildly decreased systolic function. No   significant valvular abnormalities.   ASSESSMENT:    1. Chronic systolic heart failure (HCC)   2. Paroxysmal atrial fibrillation ([place])   3. Coronary artery disease due to lipid rich plaque   4. Pure hypercholesterolemia      PLAN:  In order of problems listed above:  1. The patient had prior symptoms of PND but this has improved significantly since Afib ablation. He appears to be euvolemic now. No indication for diuretic. On maximal tolerated dose of Entresto and Coreg. With higher doses developed symptomatic hypotension. Renal function stable. Continue current therapy and sodium restriction. 2. No evidence of recurrent Afib post ablation 3. Patient has no anginal symptom on medical therapy. Excellent ETT results this past year. 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 If so we will arrange. 4. Discussed statin therapy based on data showing reduced CV risk. He is concerned about side  effects and states his cholesterol is "pretty good". No interested in  lipid lowering therapy..  Follow up in 6 months.    Medication Adjustments/Labs and Tests Ordered: Current medicines are reviewed at length with the patient today.  Concerns regarding medicines are outlined above.  Medication changes, Labs and Tests ordered today are listed in the Patient Instructions below. Patient Instructions  Continue your current therapy  I will see you in 6 months.    Signed, Kaide Gage Martinique, MD ,[facility] 12/26/2016 1:20 PM    [place] [address] [place], [place], [address] [phone]

## 2016-12-26 ENCOUNTER — Ambulatory Visit (INDEPENDENT_AMBULATORY_CARE_PROVIDER_SITE_OTHER): Payer: Medicare Other | Admitting: Cardiology

## 2016-12-26 ENCOUNTER — Other Ambulatory Visit: Payer: Medicare Other | Admitting: *Deleted

## 2016-12-26 ENCOUNTER — Ambulatory Visit (INDEPENDENT_AMBULATORY_CARE_PROVIDER_SITE_OTHER): Payer: Medicare Other | Admitting: Pharmacist

## 2016-12-26 ENCOUNTER — Encounter: Payer: Self-pay | Admitting: Cardiology

## 2016-12-26 VITALS — BP 110/78 | HR 64 | Ht 74.0 in | Wt 200.0 lb

## 2016-12-26 VITALS — BP 120/74 | HR 69 | Wt 200.0 lb

## 2016-12-26 DIAGNOSIS — I2510 Atherosclerotic heart disease of native coronary artery without angina pectoris: Secondary | ICD-10-CM

## 2016-12-26 DIAGNOSIS — I10 Essential (primary) hypertension: Secondary | ICD-10-CM | POA: Diagnosis not present

## 2016-12-26 DIAGNOSIS — I2583 Coronary atherosclerosis due to lipid rich plaque: Secondary | ICD-10-CM | POA: Diagnosis not present

## 2016-12-26 DIAGNOSIS — E7800 Pure hypercholesterolemia, unspecified: Secondary | ICD-10-CM | POA: Diagnosis not present

## 2016-12-26 DIAGNOSIS — I5022 Chronic systolic (congestive) heart failure: Secondary | ICD-10-CM

## 2016-12-26 DIAGNOSIS — I480 Paroxysmal atrial fibrillation: Secondary | ICD-10-CM

## 2016-12-26 NOTE — Patient Instructions (Signed)
Continue your current therapy  I will see you in 6 months.   

## 2016-12-26 NOTE — Progress Notes (Signed)
Patient ID: Keith Vazquez                 DOB: January 06, 1951                      MRN: [id number]     HPI: Keith Vazquez is a 66 y.o. male patient of Dr. Martinique, referred by Dr. [name], with PMH below who presents today for medication titration. He was started on Entresto on 11/25/16 after a 36 hour wash out period of his ACEi. At his most recent OV his Delene Loll was increased to 49-51mg  BID. He has since called and reported that BP consistently <100 (98/50, 90/58) at home and his dose was decreased back to 24/26mg  BID. His potassium was also stopped at his most recent OV. BMET ordered for today.   He presents today stating that he has been doing better on lower dose of Entresto, but would like to give if a few more weeks before he decides to stay on the medication permanently. Discussed that we could change to valsartan component alone, but patient would prefer to go back to ramipril if need to stop Entresto.   Cardiac Hx: HTN, ischemic cardiomyopathy, Afib, CAD, CHF, HLD  Current HTN meds:  Entresto 24/26mg  BID Carvedilol 6.25mg  in the morning and 12.5mg  in the evening  Diet: Patient eats a very healthy diet and always has. He states there is no replacement for a good diet.   Exercise: Follows a strict workout regimen.  Home BP readings: 2-24 - 109/73 2-23 124/76 2-22 120/75 2-21 112//71  Wt Readings from Last 3 Encounters:  12/26/16 200 lb (90.7 kg)  12/05/16 199 lb 6.4 oz (90.4 kg)  11/29/16 200 lb (90.7 kg)   BP Readings from Last 3 Encounters:  12/26/16 120/74  12/26/16 110/78  12/05/16 110/82   Pulse Readings from Last 3 Encounters:  12/26/16 69  12/26/16 64  12/05/16 66    Renal function: CrCl cannot be calculated (Patient's most recent lab result is older than the maximum 21 days allowed.).  Past Medical History:  Diagnosis Date  . Atrial flutter ([place])   . CHF (congestive heart failure) (HCC)    LVEF at time of cath 2002 50%; now has been 35-40% since 2008  (patient has seen multiple cardiologists over the years including [facility], [place], [place] [facility], [facility] and Tollie Eth, MD)  . Coronary artery disease    CABG x5 in 2000; s/p cath 2002 > 4/5 grafts patent, LVEF 50%  . Degeneration of lumbar intervertebral disc   . GERD (gastroesophageal reflux disease)   . GI bleed    duodenal ulcer  . Hypertension    Borderline  . Ischemic heart disease    s/p MI, CABG 2000, s/p cath 2002 with 4/5 grafts patent (LIMA to LAD atretic and occluded mid vessel but adequate flow to distal LAD from collaterals / diagonal  . Low back pain   . PAC (premature atrial contraction)   . PAF (paroxysmal atrial fibrillation) ([place])    initially diagnosed during stress test 2002  . Peyronie's disease     Current Outpatient Prescriptions on File Prior to Visit  Medication Sig Dispense Refill  . Ascorbic Acid (VITAMIN C) 100 MG tablet Take 500 mg by mouth daily.     Marland Kitchen b complex vitamins capsule Take 1 capsule by mouth daily.      . carvedilol (COREG) 6.25 MG tablet Take 1 tablet each morning and 2  tablets each evening. 180 tablet 3  . celecoxib (CELEBREX) 200 MG capsule Take 200 mg by mouth daily as needed for moderate pain.     . Cholecalciferol (VITAMIN D) 1000 UNITS capsule Take 1,000 Units by mouth daily.      Marland Kitchen co-enzyme Q-10 30 MG capsule Take 100 mg by mouth 3 (three) times daily.    . niacin 250 MG CR capsule Take 250 mg by mouth daily.      . Omega-3 Fatty Acids (FISH OIL PO) Take 2,400 mg by mouth daily.     . potassium chloride SA (K-DUR,KLOR-CON) 20 MEQ tablet Take 1 tablet (20 mEq total) by mouth 2 (two) times daily. 180 tablet 1  . promethazine-dextromethorphan (PROMETHAZINE-DM) 6.25-15 MG/5ML syrup Take 5 mLs by mouth 4 (four) times daily as needed for cough. 118 mL 0  . sacubitril-valsartan (ENTRESTO) 24-26 MG Take 1 tablet by mouth 2 (two) times daily. 180 tablet 3  . sildenafil (REVATIO) 20 MG tablet Take 5 tablets  (100 mg total) by mouth daily as needed. (Patient not taking: Reported on 12/05/2016) 100 tablet 11  . SUMAtriptan (IMITREX) 100 MG tablet Take 1 tablet (100 mg total) by mouth every 2 (two) hours as needed for migraine or headache. May repeat in 2 hours if headache persists or recurs. 10 tablet 5  . vitamin E 200 UNIT capsule Take 200 Units by mouth daily.     No current facility-administered medications on file prior to visit.     Allergies  Allergen Reactions  . Amiodarone Other (See Comments)    Left permanent scar on eye  . Oxycodone-Acetaminophen Nausea Only  . Propoxyphene N-Acetaminophen Nausea Only    Blood pressure 120/74, pulse 69.   Assessment/Plan: Medication titration: BP ok today in office. Pending lab results will continue Entresto 24-26mg  BID. Provided clinic number to call if decides that he would like to go back to ramipril.   BMET returned WNL. Patient notified.    Thank you, Lelan Pons. Patterson Hammersmith, [facility]  12/26/2016 4:37 PM

## 2016-12-26 NOTE — Patient Instructions (Signed)
Check your blood pressure at home daily (if able) and keep record of the readings.  Take your BP meds as follows: Continue Entresto 24-26mg  twice daily  Please call [phone] with any additional low pressures.   Bring all of your meds, your BP cuff and your record of home blood pressures to your next appointment.  Exercise as you're able, try to walk approximately 30 minutes per day.  Keep salt intake to a minimum, especially watch canned and prepared boxed foods.  Eat more fresh fruits and vegetables and fewer canned items.  Avoid eating in fast food restaurants.    HOW TO TAKE YOUR BLOOD PRESSURE: . Rest 5 minutes before taking your blood pressure. .  Don't smoke or drink caffeinated beverages for at least 30 minutes before. . Take your blood pressure before (not after) you eat. . Sit comfortably with your back supported and both feet on the floor (don't cross your legs). . Elevate your arm to heart level on a table or a desk. . Use the proper sized cuff. It should fit smoothly and snugly around your bare upper arm. There should be enough room to slip a fingertip under the cuff. The bottom edge of the cuff should be 1 inch above the crease of the elbow. . Ideally, take 3 measurements at one sitting and record the average.

## 2016-12-27 ENCOUNTER — Encounter: Payer: Self-pay | Admitting: Pharmacist

## 2016-12-27 LAB — BASIC METABOLIC PANEL
BUN/Creatinine Ratio: 20 (ref 10–24)
BUN: 18 mg/dL (ref 8–27)
CALCIUM: 9 mg/dL (ref 8.6–10.2)
CO2: 26 mmol/L (ref 18–29)
CREATININE: 0.91 mg/dL (ref 0.76–1.27)
Chloride: 99 mmol/L (ref 96–106)
GFR calc Af Amer: 100 mL/min/{1.73_m2} (ref >59)
GFR, EST NON AFRICAN AMERICAN: 87 mL/min/{1.73_m2} (ref >59)
Glucose: 84 mg/dL (ref 65–99)
Potassium: 4.2 mmol/L (ref 3.5–5.2)
SODIUM: 139 mmol/L (ref 134–144)

## 2016-12-29 ENCOUNTER — Encounter: Payer: Self-pay | Admitting: Internal Medicine

## 2016-12-29 ENCOUNTER — Ambulatory Visit (INDEPENDENT_AMBULATORY_CARE_PROVIDER_SITE_OTHER): Payer: Medicare Other | Admitting: Internal Medicine

## 2016-12-29 DIAGNOSIS — I2583 Coronary atherosclerosis due to lipid rich plaque: Secondary | ICD-10-CM

## 2016-12-29 DIAGNOSIS — R0600 Dyspnea, unspecified: Secondary | ICD-10-CM | POA: Diagnosis not present

## 2016-12-29 DIAGNOSIS — I2510 Atherosclerotic heart disease of native coronary artery without angina pectoris: Secondary | ICD-10-CM | POA: Diagnosis not present

## 2016-12-29 MED ORDER — TRIAMTERENE-HCTZ 37.5-25 MG PO TABS: ORAL_TABLET | ORAL | 3 refills | Status: DC

## 2016-12-29 MED ORDER — AZELASTINE-FLUTICASONE 137-50 MCG/ACT NA SUSP: 1.0000 | Freq: Every day | NASAL | 99 refills | Status: DC

## 2016-12-29 NOTE — Progress Notes (Signed)
HPI M former smoker followed for dyspnea, complicated by Atrial fib, CHF, ischemic CM, duodenal ulcer, GERD  PFT 07/01/14- normal spirometry with insignificant response to bronchodilator, normal lung volumes, moderately severe diffusion defect  ---------------------------------------------------------------------------------------------  09/02/14- 63 yoM former smoker referred courtesy of Dr [name] for c/o dyspnea which pt had felt was worse when off amiodarone, though recently in sinus rhythm.  PFT 07/01/14- normal spirometry with insignificant response to bronchodilator, normal lung volumes, moderately severe diffusion defect. TEE before ablation 04/2014- EF 40-45%, LAE. Quit smoking in 1986. Says reading difficulty comes and goes irregularly. He notices that he begins to breathe more deeply/rapidly, over the past year and a half. Onset is random, day or night and regardless of position. He can be sitting at rest. For many years he has felt that his ability to take a deep breath and his tendency to get short of breath with exercise were not quite up to normal. He feels "my windpipe is too small". Never easy to breathe through his nose. In the last month or 2 he thinks he has slowly gotten better. AFib diagnosed 2 years ago. CABG in 2000. Hx MI. Stuffy nose bothers his sleep. 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  11/24/14-  66 yoM former smoker followedfor dyspnea which pt had felt was worse when off amiodarone, though recently in sinus rhythm FOLLOWS FOR: Could tell a bigger difference with Dymista-needs Rx with refills for 1 year; could not tell a difference with Spiriva Feels great. Working hard at gym. Breathes hard with exertion. Endurance improved/ Really likes Dymista "life changing"- helps air movement We discussed his PFT again showing no reversible obstructive disease  12/29/2016- 65 yoM former smoker followedfor dyspnea , complicated  by CM, CHF, AFib resolved/ablation Echo-04/21/2016-EF 40-45 percent, grade 1 diastolic dysfunction, PA 19  FOLLOWS FOR: Last seen 10-2014. Pt states he noticed SOB with exertion lately. Feels he is getting better and concerned of what caused it. He states he wakes 4:30-5am and gasping for air-had sleep study in the past-"this is not sleep apnea" The gasping for air usually lasts for about 4-5 hours.  He describes events where he wakes from sleep feeling need to draw a deeper breath to be comfortable, unassociated with cough, pain, wheeze. The feeling persisted even after he gets up and moves around and even goes to the gym. It doesn't seem to affect his exercise tolerance. He reports Dr. [name] suspected it was related to fluid shift into his thorax due to his cardiac condition. His home oxygen saturation usually runs 96-98% on room air. Atrial fibrillation has not returned. Asthma inhaler seems to make the feeling worse. Denies ankle edema.  ROS-see HPI Constitutional:   No-   weight loss, night sweats, fevers, chills, fatigue, lassitude. HEENT:   + headaches, difficulty swallowing, tooth/dental problems, sore throat,       No-  sneezing, itching, ear ache,+nasal congestion, post nasal drip,  CV:  No-   chest pain, orthopnea, PND, swelling in lower extremities, anasarca,  dizziness, palpitations Resp: + shortness of breath with exertion or at rest.              No-   productive cough,  No non-productive cough,  No- coughing up of blood.              No-   change in color of mucus.  No- wheezing.   Skin: No-   rash or lesions. GI:  No-   heartburn, indigestion, abdominal pain, nausea, vomiting,e GU:  MS:  No-   joint pain or swelling.  . Neuro-     nothing unusual Psych:  No- change in mood or affect. No depression or anxiety.  No memory loss.  OBJ- Physical Exam General- Alert, Oriented, Affect-appropriate, Distress- none acute, + looks comfortable Skin- rash-none, lesions- none, excoriation-  none,   Lymphadenopathy- none Head- atraumatic            Eyes- Gross vision intact, PERRLA, conjunctivae and secretions clear            Ears- Hearing, canals-normal            Nose- Clear, no-Septal dev, mucus, polyps, erosion, perforation             Throat- Mallampati III , mucosa clear , drainage- none, tonsils- atrophic Neck- flexible , trachea midline, no stridor , thyroid nl, carotid no bruit Chest - symmetrical excursion , unlabored           Heart/CV- RRR , no murmur , no gallop  , no rub, nl s1 s2                           - JVD- none , edema- none, stasis changes- none, varices- none           Lung- clear to P&A, wheeze- none, cough- none , dullness-none, rub- none           Chest wall-  Abd-  Br/ Gen/ Rectal- Not done, not indicated Extrem- cyanosis- none, clubbing, none, atrophy- none, strength- nl Neuro- grossly intact to observation

## 2016-12-29 NOTE — Patient Instructions (Signed)
Script sent for diuretic to use upon noting the shortness of breath episode. See if it speeds return to normal comfort.   Script sent refilling Dymista nasal spray  Please call as needed

## 2016-12-30 NOTE — Assessment & Plan Note (Signed)
His episodic dyspnea is atypical and hard to categorize as described. It would not be common in my experience for this feeling, if related to orthostatic fluid shift, to persist once he is upright and exercising. His cardiac history does suggest a relationship. Plan-limited supply of diuretic to take when he first notices these episodes. We're looking to see if they resolve more quickly with a diuretic has time to work. A therapeutic trial of NTG would be another consideration.

## 2017-02-09 DIAGNOSIS — M47816 Spondylosis without myelopathy or radiculopathy, lumbar region: Secondary | ICD-10-CM | POA: Diagnosis not present

## 2017-02-09 DIAGNOSIS — Z79891 Long term (current) use of opiate analgesic: Secondary | ICD-10-CM | POA: Diagnosis not present

## 2017-02-09 DIAGNOSIS — G894 Chronic pain syndrome: Secondary | ICD-10-CM | POA: Diagnosis not present

## 2017-06-05 ENCOUNTER — Telehealth: Payer: Self-pay | Admitting: Pharmacist

## 2017-06-05 MED ORDER — RAMIPRIL 10 MG PO CAPS
10.0000 mg | ORAL_CAPSULE | Freq: Every day | ORAL | 3 refills | Status: DC
Start: 2017-06-05 — End: 2017-11-02

## 2017-06-05 NOTE — Telephone Encounter (Signed)
Returned call to patient about his Delene Loll. He immediately "went off" on me in his terms. He states that he has reached his coverage gap and he will not in anyway pay for this medication. I offered to assist with application through [facility] and he stated that he has a net worth more than I can imagine because he was not "dumb with his money and spending it on fancy medications." I advised I would be happy to help with costs in anyway I could and that I did not feel that pts health was a "waste of money." He again stated that he was not impressed with Delene Loll or their Architect. I offered to change back to ramipril and he states he has already resumed ramipril. I told him I would send a prescription for his previous dose to pharmacy. He states appreciation and that he would not be returning to office for any provider visits unless he was not feeling well.

## 2017-07-06 ENCOUNTER — Encounter: Payer: Self-pay | Admitting: Nurse Practitioner

## 2017-07-06 ENCOUNTER — Ambulatory Visit (INDEPENDENT_AMBULATORY_CARE_PROVIDER_SITE_OTHER): Payer: Medicare Other | Admitting: Nurse Practitioner

## 2017-07-06 VITALS — BP 110/76 | HR 62 | Temp 98.2°F | Ht 73.5 in | Wt 201.0 lb

## 2017-07-06 DIAGNOSIS — L819 Disorder of pigmentation, unspecified: Secondary | ICD-10-CM | POA: Diagnosis not present

## 2017-07-06 NOTE — Patient Instructions (Signed)
You have an appt with [facility] 07/12/2017 at [place] with Robyne Askew, [place]. [address] [place], [place] [address]. [phone].

## 2017-07-06 NOTE — Progress Notes (Signed)
Subjective:  Patient ID: Keith Vazquez, male    DOB: 06/26/1951  Age: 66 y.o. MRN: [id number]  CC: Follow-up (spot on nose--10 years ago,bleed a little at times, discoloration/ )   Rash  This is a chronic problem. The current episode started more than 1 year ago. The problem has been waxing and waning since onset. Location: NOSE. The rash is characterized by scaling, redness and peeling. He was exposed to nothing. Pertinent negatives include no eye pain, facial edema or sore throat. Treatments tried: cryotherapy. The treatment provided no relief.    Outpatient Medications Prior to Visit  Medication Sig Dispense Refill  . Ascorbic Acid (VITAMIN C) 100 MG tablet Take 500 mg by mouth daily.     . Azelastine-Fluticasone (DYMISTA) 137-50 MCG/ACT SUSP Place 1-2 puffs into the nose at bedtime. 1 Bottle prn  . b complex vitamins capsule Take 1 capsule by mouth daily.      . carvedilol (COREG) 6.25 MG tablet Take 1 tablet each morning and 2 tablets each evening. 180 tablet 3  . celecoxib (CELEBREX) 200 MG capsule Take 200 mg by mouth daily as needed for moderate pain.     . Cholecalciferol (VITAMIN D) 1000 UNITS capsule Take 1,000 Units by mouth daily.      Marland Kitchen co-enzyme Q-10 30 MG capsule Take 100 mg by mouth 3 (three) times daily.    . niacin 250 MG CR capsule Take 250 mg by mouth daily.      . Omega-3 Fatty Acids (FISH OIL PO) Take 2,400 mg by mouth daily.     . promethazine-dextromethorphan (PROMETHAZINE-DM) 6.25-15 MG/5ML syrup Take 5 mLs by mouth 4 (four) times daily as needed for cough. 118 mL 0  . ramipril (ALTACE) 10 MG capsule Take 1 capsule (10 mg total) by mouth daily. 90 capsule 3  . sildenafil (REVATIO) 20 MG tablet Take 5 tablets (100 mg total) by mouth daily as needed. 100 tablet 11  . SUMAtriptan (IMITREX) 100 MG tablet Take 1 tablet (100 mg total) by mouth every 2 (two) hours as needed for migraine or headache. May repeat in 2 hours if headache persists or recurs. 10 tablet 5  .  triamterene-hydrochlorothiazide (MAXZIDE-25) 37.5-25 MG tablet 1 daily as directed for fluid ret 15 tablet 3  . vitamin E 200 UNIT capsule Take 200 Units by mouth daily.     No facility-administered medications prior to visit.     ROS See HPI  Objective:  BP 110/76   Pulse 62   Temp 98.2 F (36.8 C)   Ht 6' 1.5" (1.867 m)   Wt 201 lb (91.2 kg)   SpO2 98%   BMI 26.16 kg/m   BP Readings from Last 3 Encounters:  07/06/17 110/76  12/29/16 122/68  12/26/16 120/74    Wt Readings from Last 3 Encounters:  07/06/17 201 lb (91.2 kg)  12/29/16 198 lb 6.4 oz (90 kg)  12/26/16 200 lb (90.7 kg)    Physical Exam  Constitutional: No distress.  Cardiovascular: Normal rate.   Pulmonary/Chest: Effort normal.  Skin: Skin is warm and dry. Rash noted. Rash is papular.  Circular lesion on nose, symmetrical with telangiectasis and scaly.   Vitals reviewed.   Lab Results  Component Value Date   WBC 7.3 11/30/2016   HGB 16.5 11/30/2016   HCT 48.4 11/30/2016   PLT 157.0 11/30/2016   GLUCOSE 84 12/26/2016   CHOL 181 11/30/2016   TRIG 162.0 (H) 11/30/2016   HDL 44.20 11/30/2016  LDLDIRECT 120.5 09/30/2013   LDLCALC 104 (H) 11/30/2016   ALT 7 11/30/2016   AST 17 11/30/2016   NA 139 12/26/2016   K 4.2 12/26/2016   CL 99 12/26/2016   CREATININE 0.91 12/26/2016   BUN 18 12/26/2016   CO2 26 12/26/2016   TSH 1.31 11/30/2016   PSA 0.67 11/30/2016   INR 1.00 11/26/2013    Mr Wrist Right Wo Contrast  Result Date: 06/24/2015 CLINICAL DATA:  Right wrist pain at the base of the thumb with weakness for approximately 1 year. No known injury. EXAM: MR OF THE RIGHT WRIST WITHOUT CONTRAST TECHNIQUE: Multiplanar, multisequence MR imaging of the right wrist was performed. No intravenous contrast was administered. COMPARISON:  Plain films of the right wrist 03/20/2015. FINDINGS: Ligaments: The scapholunate ligament including the dorsal band is torn. The lunotriquetral ligament is intact.  Triangular fibrocartilage: The disc of the triangular fibrocartilage is degenerated and but no discrete tear is seen. Tendons: Intact. A fluid collection measuring approximately 1.3 cm transverse by 0.4 cm AP by 0.9 cm craniocaudal is seen deep to the extensor carpi ulnaris at the level of the distal ulna most consistent with a ganglion cyst or adventitial bursa. Carpal tunnel/median nerve: Unremarkable. Guyon's canal: Unremarkable. Joint/cartilage: Degenerative disease is seen about the wrist with hyaline cartilage loss appearing worst about the scaphoid trapezium trapezoid and first CMC joints. Hyaline cartilage loss at the radiocarpal joint is also identified. Bones/carpal alignment: There is abnormal dorsal tilt of the lunate consistent with DISI. Subchondral edema is seen about the STT joint. A milder degree of subchondral edema seen at the radiocarpal joint. Other: None. IMPRESSION: Scapholunate ligament tear involves the dorsal band. There is associated DISI. Degenerative disease about the wrist most notable at the STT and radiocarpal joints. Degenerated triangular fibrocartilage without tear identified. Small ganglion cyst versus adventitial bursa deep to the extensor carpi ulnaris tendon at the level of the distal ulna. Electronically Signed   By: Inge Rise M.D.   On: 06/24/2015 07:47    Assessment & Plan:   Rollen was seen today for follow-up.  Diagnoses and all orders for this visit:  Atypical pigmented lesion -     Ambulatory referral to Dermatology   I am having Mr. Drennon maintain his b complex vitamins, Vitamin D, Omega-3 Fatty Acids (FISH OIL PO), niacin, celecoxib, vitamin C, promethazine-dextromethorphan, SUMAtriptan, sildenafil, vitamin E, co-enzyme Q-10, carvedilol, triamterene-hydrochlorothiazide, Azelastine-Fluticasone, and ramipril.  No orders of the defined types were placed in this encounter.   Follow-up: No Follow-up on file.  Wilfred Lacy, NP

## 2017-07-12 ENCOUNTER — Other Ambulatory Visit: Payer: Self-pay | Admitting: Physician Assistant

## 2017-07-12 DIAGNOSIS — C44311 Basal cell carcinoma of skin of nose: Secondary | ICD-10-CM | POA: Diagnosis not present

## 2017-07-12 DIAGNOSIS — D229 Melanocytic nevi, unspecified: Secondary | ICD-10-CM | POA: Diagnosis not present

## 2017-08-10 DIAGNOSIS — M47816 Spondylosis without myelopathy or radiculopathy, lumbar region: Secondary | ICD-10-CM | POA: Diagnosis not present

## 2017-08-10 DIAGNOSIS — G894 Chronic pain syndrome: Secondary | ICD-10-CM | POA: Diagnosis not present

## 2017-08-10 DIAGNOSIS — Z79891 Long term (current) use of opiate analgesic: Secondary | ICD-10-CM | POA: Diagnosis not present

## 2017-09-12 ENCOUNTER — Ambulatory Visit (INDEPENDENT_AMBULATORY_CARE_PROVIDER_SITE_OTHER): Payer: Medicare Other | Admitting: General Practice

## 2017-09-12 DIAGNOSIS — Z23 Encounter for immunization: Secondary | ICD-10-CM

## 2017-10-03 DIAGNOSIS — C44311 Basal cell carcinoma of skin of nose: Secondary | ICD-10-CM | POA: Diagnosis not present

## 2017-11-02 ENCOUNTER — Ambulatory Visit (INDEPENDENT_AMBULATORY_CARE_PROVIDER_SITE_OTHER): Payer: Medicare Other | Admitting: Physician Assistant

## 2017-11-02 ENCOUNTER — Encounter: Payer: Self-pay | Admitting: Physician Assistant

## 2017-11-02 VITALS — BP 122/84 | HR 67 | Ht 73.5 in | Wt 207.2 lb

## 2017-11-02 DIAGNOSIS — E785 Hyperlipidemia, unspecified: Secondary | ICD-10-CM | POA: Diagnosis not present

## 2017-11-02 DIAGNOSIS — I2510 Atherosclerotic heart disease of native coronary artery without angina pectoris: Secondary | ICD-10-CM

## 2017-11-02 DIAGNOSIS — I10 Essential (primary) hypertension: Secondary | ICD-10-CM

## 2017-11-02 DIAGNOSIS — I255 Ischemic cardiomyopathy: Secondary | ICD-10-CM

## 2017-11-02 DIAGNOSIS — I480 Paroxysmal atrial fibrillation: Secondary | ICD-10-CM

## 2017-11-02 DIAGNOSIS — I5022 Chronic systolic (congestive) heart failure: Secondary | ICD-10-CM | POA: Diagnosis not present

## 2017-11-02 MED ORDER — ASPIRIN EC 81 MG PO TBEC: 81.0000 mg | DELAYED_RELEASE_TABLET | Freq: Every day | ORAL | 3 refills | Status: DC

## 2017-11-02 NOTE — Patient Instructions (Addendum)
Medication Instructions: START Aspirin 81 mg tablet daily.  If you need a refill on your cardiac medications before your next appointment, please call your pharmacy.   Labs:  Your physician recommends that you return for fasting lab work in a few days: CMET and Lipid.    Procedures/Testing: Your physician has requested that you have an echocardiogram. Echocardiography is a painless test that uses sound waves to create images of your heart. It provides your doctor with information about the size and shape of your heart and how well your heart's chambers and valves are working. This procedure takes approximately one hour. There are no restrictions for this procedure. This will be done at [address], [address].  Your physician has requested that you have an exercise stress myoview after the echocardiogram. For further information please visit [email or web address]. Please follow instruction sheet, as given. This will be done at [place], [address].   Follow-Up: Your physician wants you to follow-up in: 12 months with Dr. Martinique.. You will receive a reminder letter in the mail two months in advance. If you don't receive a letter, please call our office at [phone] to schedule this follow-up appointment.   Thank you for choosing [facility] at [facility]!!

## 2017-11-02 NOTE — Progress Notes (Addendum)
Cardiology Office Note   Date:  11/02/2017   ID:  Keith Vazquez, DOB 1951-01-15, MRN [id number]  PCP:  [name], MD  Cardiologist: Dr. Martinique, 12/26/2016 Rosaria Ferries, PA-C   CC: Cardiology evaluation and follow-up   History of Present Illness: Keith Vazquez is a 67 y.o. male with a history of PAF s/p ablation 2015, S-CHF, ICM w/ EF 35-40%, CABG 2000 w/ LIMA-LAD, SVG-RI-OM, SVG-D2, SVG-PDA. LIMA atretic, other grafts patent 2015 cath, DJD, GERD, HTN,  Keith Vazquez presents for cardiology follow up.   He has been doing well. He is interested in going back to flying. In order to do that, he has to get an echo and then a MV if his EF is > 40%. 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 He wants to go back to [place].   He exercises regularly, weights and cardio. He never gets chest pain or SOB w/ exertion. His exertion is not limited by his breathing.  He tracks his heart rate with exertion, and regularly reaches 100% of his target heart rate.  This does not give him chest pain or shortness of breath.  He feels his exercise capacity is good and he is maintaining this with his workouts.  He is compliant with his medications.  He works at eating a heart healthy diet.  He feels strongly that his lifestyle is the reason that he has done so well since his bypass 18 years ago.  He never has palpitations, denies presyncope or syncope.  He has not had any more atrial fibrillation.  His weight is up a little bit on our scales, but feels that most of this is the heavier clothes and shoes he wears in the winter.  He may have gained a pound or 2 over the holidays, but has not been having problems with lower extremity edema. Denies orthopnea or PND.  He denies any heart failure symptoms.   Past Medical History:  Diagnosis Date  . Atrial flutter ([place])   . CHF (congestive heart failure) (HCC)    LVEF at time of cath 2002 50%; now has been 35-40% since 2008  (patient has seen multiple cardiologists over the years including [facility], [place], [facility], [facility] and Tollie Eth, MD)  . Coronary artery disease    CABG x5 in 2000; s/p cath 2002 > 4/5 grafts patent, LVEF 50%  . Degeneration of lumbar intervertebral disc   . GERD (gastroesophageal reflux disease)   . GI bleed    duodenal ulcer  . Hypertension    Borderline  . Ischemic heart disease    s/p MI, CABG 2000, s/p cath 2002 with 4/5 grafts patent (LIMA to LAD atretic and occluded mid vessel but adequate flow to distal LAD from collaterals / diagonal  . Low back pain   . PAC (premature atrial contraction)   . PAF (paroxysmal atrial fibrillation) ([place])    initially diagnosed during stress test 2002  . Peyronie's disease     Past Surgical History:  Procedure Laterality Date  . ABLATION  05-06-2014   PVI and CTI ablation by Dr [name]  . ATRIAL FIBRILLATION ABLATION N/A 05/06/2014   Procedure: ATRIAL FIBRILLATION ABLATION;  Surgeon: [name], MD;  Location: [place] CATH LAB;  Service: Cardiovascular;  Laterality: N/A;  . CARDIAC CATHETERIZATION  09/14/2001    Mildly decreased left ventricular systolic function --  Native three vessel coronary artery disease as described. --  Status post coronary artery bypass grafting  . CORONARY ARTERY BYPASS GRAFT  2000   LIMA to LAD, SVG to second diagonal, seq SVG to ramus and OM, SVG to PDA  . HERNIA REPAIR  05/16/2001   Large left indirect inguinal hernia with right direct hernia.  Marland Kitchen HERNIA REPAIR     Recurrent left inguinal hernia -- Large left indirect inguinal hernia with right direct hernia.  Marland Kitchen LEFT HEART CATHETERIZATION WITH CORONARY/GRAFT ANGIOGRAM  11/26/2013   Procedure: LEFT HEART CATHETERIZATION WITH [name];  Surgeon: Peter M Martinique, MD;  Location: [facility] CATH LAB;  Service: Cardiovascular;;  . TEE WITHOUT CARDIOVERSION N/A 05/06/2014   Procedure: TRANSESOPHAGEAL ECHOCARDIOGRAM (TEE);  Surgeon:  [name], MD;  Location: [facility] ENDOSCOPY;  Service: Cardiovascular;  Laterality: N/A;    Current Outpatient Medications  Medication Sig Dispense Refill  . Ascorbic Acid (VITAMIN C) 100 MG tablet Take 500 mg by mouth daily.     Marland Kitchen b complex vitamins capsule Take 1 capsule by mouth daily.      . carvedilol (COREG) 6.25 MG tablet Take 1 tablet each morning and 2 tablets each evening. 180 tablet 3  . celecoxib (CELEBREX) 200 MG capsule Take 200 mg by mouth daily as needed for moderate pain.     . Cholecalciferol (VITAMIN D) 1000 UNITS capsule Take 1,000 Units by mouth daily.      Marland Kitchen co-enzyme Q-10 30 MG capsule Take 100 mg by mouth 3 (three) times daily.    Marland Kitchen ENTRESTO 24-26 MG Take 1 tablet by mouth 2 (two) times daily.    . niacin 250 MG CR capsule Take 250 mg by mouth daily.      . Omega-3 Fatty Acids (FISH OIL PO) Take 2,400 mg by mouth daily.     . promethazine-dextromethorphan (PROMETHAZINE-DM) 6.25-15 MG/5ML syrup Take 5 mLs by mouth 4 (four) times daily as needed for cough. 118 mL 0  . sildenafil (REVATIO) 20 MG tablet Take 5 tablets (100 mg total) by mouth daily as needed. 100 tablet 11  . SUMAtriptan (IMITREX) 100 MG tablet Take 1 tablet (100 mg total) by mouth every 2 (two) hours as needed for migraine or headache. May repeat in 2 hours if headache persists or recurs. 10 tablet 5  . triamterene-hydrochlorothiazide (MAXZIDE-25) 37.5-25 MG tablet 1 daily as directed for fluid ret 15 tablet 3  . vitamin E 200 UNIT capsule Take 200 Units by mouth daily.     No current facility-administered medications for this visit.     Allergies:   Amiodarone; Oxycodone-acetaminophen; and Propoxyphene n-acetaminophen    Social History:  The patient  reports that he quit smoking about 23 years ago. His smoking use included cigarettes. He has a 20.00 pack-year smoking history. he has never used smokeless tobacco. He reports that he does not drink alcohol or use drugs.   Family History:  He was  adopted. Family history is unknown by patient.    ROS:  Please see the history of present illness. All other systems are reviewed and negative.    PHYSICAL EXAM: VS:  BP 122/84 (BP Location: Left Arm, Patient Position: Sitting, Cuff Size: Normal)   Pulse 67   Ht 6' 1.5" (1.867 m)   Wt 207 lb 3.2 oz (94 kg)   BMI 26.97 kg/m  , BMI Body mass index is 26.97 kg/m. GEN: Well nourished, well developed, male in no acute distress  HEENT: normal for age  Neck: no JVD, no carotid bruit, no masses Cardiac:  RRR; no murmur, no rubs, or gallops Respiratory:  clear to auscultation bilaterally, normal work of breathing GI: soft, nontender, nondistended, + BS MS: no deformity or atrophy; no edema; distal pulses are 2+ in all 4 extremities   Skin: warm and dry, no rash Neuro:  Strength and sensation are intact Psych: euthymic mood, full affect   EKG:  EKG is ordered today. The ekg ordered today demonstrates sinus rhythm, heart rate 67, normal intervals, inferior Q waves are old.  No significant change from 11/25/2016   Recent Labs: 11/30/2016: ALT 7; Hemoglobin 16.5; Platelets 157.0; TSH 1.31 12/26/2016: BUN 18; Creatinine, Ser 0.91; Potassium 4.2; Sodium 139    Lipid Panel    Component Value Date/Time   CHOL 181 11/30/2016 0828   TRIG 162.0 (H) 11/30/2016 0828   HDL 44.20 11/30/2016 0828   CHOLHDL 4 11/30/2016 0828   VLDL 32.4 11/30/2016 0828   LDLCALC 104 (H) 11/30/2016 0828   LDLDIRECT 120.5 09/30/2013 1400     Wt Readings from Last 3 Encounters:  11/02/17 207 lb 3.2 oz (94 kg)  07/06/17 201 lb (91.2 kg)  12/29/16 198 lb 6.4 oz (90 kg)     Other studies Reviewed: Additional studies/ records that were reviewed today include: Office notes, hospital records and testing.  ASSESSMENT AND PLAN:  1.  CAD: He is having no ischemic symptoms.  He is on high dose statin, beta-blocker, and has sublingual nitroglycerin.  Since he is off anticoagulation, we will restart aspirin 81 mg daily.   This was not on his AVS, the patient was called later and requested to start it.  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  2.  Chronic systolic CHF: He is compliant with medications and diet.  He is not having any symptoms of volume overload.  He is on triamterene/HCTZ, check a metabolic profile.  Check an echo.  3.  Ischemic cardiomyopathy: He is on good medical therapy with a beta-blocker and Entresto.  He has not tolerated a higher dose of Entresto in the past due to hypotension.  4.  History of PAF, status post ablation: He has had no recurrence of atrial fibrillation.  He no longer requires systemic anticoagulation.  5.  Hypertension: His blood pressures under good control on beta-blocker, Entresto and Maxide.  6.  Hyperlipidemia: His LDL goal is less than 70.  His last LDL was 104.  He is taking coenzyme Q 10, omega-3 fatty acids and niacin.  Check a lipid profile when he comes for his stress test, then discussed the change in therapy if he is not at target.   Current medicines are reviewed at length with the patient today.  The patient does not have concerns regarding medicines.  The following changes have been made: Restart ASA 81 mg  Labs/ tests ordered today include:   Orders Placed This Encounter  Procedures  . Comprehensive metabolic panel  . Lipid panel  . MYOCARDIAL PERFUSION IMAGING  . EKG 12-Lead  . ECHOCARDIOGRAM COMPLETE     Disposition:   FU with Dr. Martinique  Signed, Rosaria Ferries, PA-C  11/02/2017 11:05 AM    [place] Phone: [phone]; Fax: [phone]  This note was written with the assistance of speech recognition software. Please excuse any transcriptional errors.  Addendum: Under #1 above, CAD, high-dose statin is mentioned. This is incorrect. Keith Vazquez has been reluctant to take statins due to concern about the  side effects. As long as his LDL is kept  low, our office has not objected to this.   The statement about high-dose statins was an error.   Rosaria Ferries, PA-C 02/20/2018 4:39 PM

## 2017-11-06 ENCOUNTER — Ambulatory Visit (HOSPITAL_COMMUNITY): Payer: Medicare Other | Attending: Cardiology

## 2017-11-06 ENCOUNTER — Other Ambulatory Visit: Payer: Self-pay

## 2017-11-06 DIAGNOSIS — I2510 Atherosclerotic heart disease of native coronary artery without angina pectoris: Secondary | ICD-10-CM | POA: Diagnosis not present

## 2017-11-06 DIAGNOSIS — Z951 Presence of aortocoronary bypass graft: Secondary | ICD-10-CM | POA: Diagnosis not present

## 2017-11-06 DIAGNOSIS — I255 Ischemic cardiomyopathy: Secondary | ICD-10-CM | POA: Diagnosis not present

## 2017-11-06 DIAGNOSIS — I509 Heart failure, unspecified: Secondary | ICD-10-CM | POA: Insufficient documentation

## 2017-11-06 DIAGNOSIS — I110 Hypertensive heart disease with heart failure: Secondary | ICD-10-CM | POA: Insufficient documentation

## 2017-11-06 DIAGNOSIS — I10 Essential (primary) hypertension: Secondary | ICD-10-CM

## 2017-11-06 DIAGNOSIS — I4891 Unspecified atrial fibrillation: Secondary | ICD-10-CM | POA: Insufficient documentation

## 2017-11-06 DIAGNOSIS — E785 Hyperlipidemia, unspecified: Secondary | ICD-10-CM | POA: Diagnosis not present

## 2017-11-06 LAB — COMPREHENSIVE METABOLIC PANEL
ALT: 7 IU/L (ref 0–44)
AST: 21 IU/L (ref 0–40)
Albumin/Globulin Ratio: 2 (ref 1.2–2.2)
Albumin: 4.5 g/dL (ref 3.6–4.8)
Alkaline Phosphatase: 70 IU/L (ref 39–117)
BUN/Creatinine Ratio: 15 (ref 10–24)
BUN: 15 mg/dL (ref 8–27)
Bilirubin Total: 0.8 mg/dL (ref 0.0–1.2)
CO2: 24 mmol/L (ref 20–29)
CREATININE: 0.99 mg/dL (ref 0.76–1.27)
Calcium: 9.4 mg/dL (ref 8.6–10.2)
Chloride: 103 mmol/L (ref 96–106)
GFR calc Af Amer: 90 mL/min/{1.73_m2} (ref >59)
GFR calc non Af Amer: 78 mL/min/{1.73_m2} (ref >59)
GLOBULIN, TOTAL: 2.3 g/dL (ref 1.5–4.5)
GLUCOSE: 102 mg/dL — AB (ref 65–99)
Potassium: 4.2 mmol/L (ref 3.5–5.2)
SODIUM: 139 mmol/L (ref 134–144)
Total Protein: 6.8 g/dL (ref 6.0–8.5)

## 2017-11-06 LAB — LIPID PANEL
CHOLESTEROL TOTAL: 122 mg/dL (ref 100–199)
Chol/HDL Ratio: 3 ratio (ref 0.0–5.0)
HDL: 41 mg/dL (ref >39)
LDL CALC: 58 mg/dL (ref 0–99)
TRIGLYCERIDES: 117 mg/dL (ref 0–149)
VLDL Cholesterol Cal: 23 mg/dL (ref 5–40)

## 2017-11-08 ENCOUNTER — Telehealth (HOSPITAL_COMMUNITY): Payer: Self-pay

## 2017-11-08 NOTE — Telephone Encounter (Signed)
Encounter complete. 

## 2017-11-10 ENCOUNTER — Ambulatory Visit (HOSPITAL_COMMUNITY)
Admission: RE | Admit: 2017-11-10 | Discharge: 2017-11-10 | Disposition: A | Discharge status: Home / Self Care | Payer: Medicare Other | Source: Ambulatory Visit | Attending: Cardiovascular Disease | Admitting: Cardiovascular Disease

## 2017-11-10 DIAGNOSIS — R9439 Abnormal result of other cardiovascular function study: Secondary | ICD-10-CM | POA: Diagnosis not present

## 2017-11-10 DIAGNOSIS — I110 Hypertensive heart disease with heart failure: Secondary | ICD-10-CM | POA: Diagnosis not present

## 2017-11-10 DIAGNOSIS — I2510 Atherosclerotic heart disease of native coronary artery without angina pectoris: Secondary | ICD-10-CM | POA: Diagnosis not present

## 2017-11-10 DIAGNOSIS — I480 Paroxysmal atrial fibrillation: Secondary | ICD-10-CM | POA: Diagnosis not present

## 2017-11-10 DIAGNOSIS — I10 Essential (primary) hypertension: Secondary | ICD-10-CM

## 2017-11-10 DIAGNOSIS — I5022 Chronic systolic (congestive) heart failure: Secondary | ICD-10-CM | POA: Insufficient documentation

## 2017-11-10 DIAGNOSIS — I255 Ischemic cardiomyopathy: Secondary | ICD-10-CM | POA: Diagnosis not present

## 2017-11-10 DIAGNOSIS — Z951 Presence of aortocoronary bypass graft: Secondary | ICD-10-CM | POA: Diagnosis not present

## 2017-11-10 LAB — MYOCARDIAL PERFUSION IMAGING
CHL CUP NUCLEAR SSS: 9
CHL CUP RESTING HR STRESS: 66 {beats}/min
CHL RATE OF PERCEIVED EXERTION: 17
CSEPED: 10 min
CSEPEDS: 8 s
CSEPHR: 101 %
Estimated workload: 11.9 METS
LV dias vol: 147 mL (ref 62–150)
LV sys vol: 102 mL
MPHR: 152 {beats}/min
Peak HR: 155 {beats}/min
SDS: 0
SRS: 9
TID: 0.93

## 2017-11-10 MED ORDER — TECHNETIUM TC 99M TETROFOSMIN IV KIT
30.6000 | PACK | Freq: Once | INTRAVENOUS | Status: AC | PRN
  Administered 2017-11-10: 30.6 via INTRAVENOUS
  Filled 2017-11-10: qty 31

## 2017-11-10 MED ORDER — TECHNETIUM TC 99M TETROFOSMIN IV KIT
11.0000 | PACK | Freq: Once | INTRAVENOUS | Status: AC | PRN
  Administered 2017-11-10: 11 via INTRAVENOUS
  Filled 2017-11-10: qty 11

## 2017-11-16 ENCOUNTER — Encounter: Payer: Self-pay | Admitting: Physician Assistant

## 2017-11-17 ENCOUNTER — Telehealth: Payer: Self-pay | Admitting: Physician Assistant

## 2017-11-17 NOTE — Telephone Encounter (Signed)
New message   Patient says that he needs to go over some paperwork with [facility] Please call

## 2017-11-17 NOTE — Telephone Encounter (Signed)
Returned the call to the patient. 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 He stated that the [facility] has specific requirements for this paperwork that he would like to speak personnelly with Rosaria Ferries, PA about.   He wants to know why his ejection fraction was 40-45% on the echo yet 30% on the stress test. 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 He stated that he feels like the medical community has let him down and he wants to know why we have not improved his ejection fraction. For his letter, he wants it to state that the nuclear stress test was incorrect and that further testing shows an improved ejection fraction.   Message routed.

## 2017-11-20 ENCOUNTER — Encounter: Payer: Self-pay | Admitting: Physician Assistant

## 2017-11-20 NOTE — Progress Notes (Signed)
Spoke to Keith Vazquez about what is needed for his [employer].  He needs the color Myoview images as well as the results, ECG, last office note, the ECGs from the Myoview, his echo results, and a 12-lead rhythm strip.  He also needs a letter stating that Dr. Martinique feels the EF on his echo is more accurate than the EF on the Myoview.  Dr. Martinique does feel that the EF of 40-45% is more accurate.

## 2017-11-21 NOTE — Telephone Encounter (Signed)
Spoke with patient and informed him that his paper work would be in the front ready for pick up. Patient voiced understanding.

## 2018-01-29 ENCOUNTER — Other Ambulatory Visit: Payer: Self-pay | Admitting: Cardiology

## 2018-01-29 ENCOUNTER — Other Ambulatory Visit: Payer: Self-pay | Admitting: Internal Medicine

## 2018-01-29 DIAGNOSIS — G43001 Migraine without aura, not intractable, with status migrainosus: Secondary | ICD-10-CM

## 2018-01-29 NOTE — Telephone Encounter (Signed)
REFILL 

## 2018-02-06 DIAGNOSIS — G894 Chronic pain syndrome: Secondary | ICD-10-CM | POA: Diagnosis not present

## 2018-02-06 DIAGNOSIS — Z79891 Long term (current) use of opiate analgesic: Secondary | ICD-10-CM | POA: Diagnosis not present

## 2018-02-06 DIAGNOSIS — M47816 Spondylosis without myelopathy or radiculopathy, lumbar region: Secondary | ICD-10-CM | POA: Diagnosis not present

## 2018-02-14 DIAGNOSIS — H52211 Irregular astigmatism, right eye: Secondary | ICD-10-CM | POA: Diagnosis not present

## 2018-02-14 DIAGNOSIS — H40013 Open angle with borderline findings, low risk, bilateral: Secondary | ICD-10-CM | POA: Diagnosis not present

## 2018-02-14 DIAGNOSIS — H5359 Other color vision deficiencies: Secondary | ICD-10-CM | POA: Diagnosis not present

## 2018-02-14 DIAGNOSIS — H5213 Myopia, bilateral: Secondary | ICD-10-CM | POA: Diagnosis not present

## 2018-02-14 DIAGNOSIS — H2513 Age-related nuclear cataract, bilateral: Secondary | ICD-10-CM | POA: Diagnosis not present

## 2018-02-14 DIAGNOSIS — H524 Presbyopia: Secondary | ICD-10-CM | POA: Diagnosis not present

## 2018-02-20 ENCOUNTER — Encounter: Payer: Self-pay | Admitting: Physician Assistant

## 2018-02-20 ENCOUNTER — Telehealth: Payer: Self-pay | Admitting: Physician Assistant

## 2018-02-20 DIAGNOSIS — Z79899 Other long term (current) drug therapy: Secondary | ICD-10-CM

## 2018-02-20 DIAGNOSIS — E785 Hyperlipidemia, unspecified: Secondary | ICD-10-CM

## 2018-02-20 NOTE — Telephone Encounter (Signed)
I replied directly to Keith Vazquez. Please see that note. There are some medication questions it that note, they need to be answered before we print and send it to the [place]. Please text me once everything is ready and I will redo the addendum. Thanks

## 2018-02-20 NOTE — Telephone Encounter (Signed)
Returned call to patient.  Patient reports he has to submit paperwork to the [facility] yearly.  He states Rosaria Ferries PA provided documentation in January.   He states the FAA did not like part of the documentation.   Specifically a part of the documentation states he is on a "high dose statin" and a statin is not listed on his medication list.   He is unsure where this statement is on the documentation but needs this fixed ASAP.   He states he has tried to get in touch with the [place] in order to find out where this statement was made but has been unsuccessful reaching someone.   He will reattempt.    Letter created by Rosaria Ferries PA in January does not state this.  Only location is in OV note.   He is unsure if this was sent as part of the documentation.     Patient would like a call from Rosaria Ferries PA asap to discuss this.   He also sent a MyChart message.     He will attempt again to reach out to the [facility] to discuss and will update if he receives any new information.     Patient also states he is due for fasting Lipid test.  Last drawn in January.  Placed order for repeat per request, FAA requirement.

## 2018-02-20 NOTE — Telephone Encounter (Signed)
New Message:   Please call,concerning some paper work for the [employer].

## 2018-02-22 DIAGNOSIS — E785 Hyperlipidemia, unspecified: Secondary | ICD-10-CM | POA: Diagnosis not present

## 2018-02-22 DIAGNOSIS — Z79899 Other long term (current) drug therapy: Secondary | ICD-10-CM | POA: Diagnosis not present

## 2018-02-22 LAB — COMPREHENSIVE METABOLIC PANEL
ALK PHOS: 76 IU/L (ref 39–117)
ALT: 11 IU/L (ref 0–44)
AST: 27 IU/L (ref 0–40)
Albumin/Globulin Ratio: 2.1 (ref 1.2–2.2)
Albumin: 5 g/dL — ABNORMAL HIGH (ref 3.6–4.8)
BILIRUBIN TOTAL: 0.7 mg/dL (ref 0.0–1.2)
BUN/Creatinine Ratio: 12 (ref 10–24)
BUN: 13 mg/dL (ref 8–27)
CHLORIDE: 101 mmol/L (ref 96–106)
CO2: 24 mmol/L (ref 20–29)
Calcium: 9.3 mg/dL (ref 8.6–10.2)
Creatinine, Ser: 1.05 mg/dL (ref 0.76–1.27)
GFR calc Af Amer: 83 mL/min/{1.73_m2} (ref >59)
GFR calc non Af Amer: 72 mL/min/{1.73_m2} (ref >59)
GLUCOSE: 88 mg/dL (ref 65–99)
Globulin, Total: 2.4 g/dL (ref 1.5–4.5)
Potassium: 3.9 mmol/L (ref 3.5–5.2)
Sodium: 139 mmol/L (ref 134–144)
Total Protein: 7.4 g/dL (ref 6.0–8.5)

## 2018-02-22 LAB — LIPID PANEL
CHOLESTEROL TOTAL: 196 mg/dL (ref 100–199)
Chol/HDL Ratio: 4.5 ratio (ref 0.0–5.0)
HDL: 44 mg/dL (ref >39)
LDL Calculated: 115 mg/dL — ABNORMAL HIGH (ref 0–99)
TRIGLYCERIDES: 183 mg/dL — AB (ref 0–149)
VLDL CHOLESTEROL CAL: 37 mg/dL (ref 5–40)

## 2018-02-22 NOTE — Telephone Encounter (Signed)
I contacted patient to discuss meds per [facility] request she wanted to know if patient wants to take Celebrex, Sildenafil, and Triamterene-hctz off the med list since it is only taken as needed.  Unable to reach patient; left message for patient to contact office to discuss meds.

## 2018-02-26 ENCOUNTER — Other Ambulatory Visit: Payer: Self-pay

## 2018-02-26 NOTE — Progress Notes (Addendum)
  [place], [place] # [id number] MID# [id number] MLY#[id number]     Addendum: The previous office note, a reference was made to the patient being on a statin. This was an error.  He is not on a statin.  Current Meds  Medication Sig  . Ascorbic Acid (VITAMIN C) 100 MG tablet Take 500 mg by mouth daily.   Marland Kitchen b complex vitamins capsule Take 1 capsule by mouth daily.    . carvedilol (COREG) 6.25 MG tablet Take 1 tablet each morning and 2 tablets each evening.  . celecoxib (CELEBREX) 200 MG capsule Take 200 mg by mouth daily as needed for moderate pain.   . Cholecalciferol (VITAMIN D) 1000 UNITS capsule Take 1,000 Units by mouth daily.    Marland Kitchen co-enzyme Q-10 30 MG capsule Take 100 mg by mouth 3 (three) times daily.  . niacin 250 MG CR capsule Take 250 mg by mouth daily.    . Omega-3 Fatty Acids (FISH OIL PO) Take 2,400 mg by mouth daily.   . vitamin E 200 UNIT capsule Take 200 Units by mouth daily.  Marland Kitchen ENTRESTO 24-26 MG Take 1 tablet by mouth 2 (two) times daily.   ASA 81 MG  Take 1 tablet by mouth daily  . SUMAtriptan (IMITREX) 100 MG tablet Take 1 tablet (100 mg total) by mouth every 2 (two) hours as needed for migraine or headache. May repeat in 2 hours if headache persists or recurs.     Rosaria Ferries, PA-C 02/26/2018 5:28 PM

## 2018-02-27 ENCOUNTER — Telehealth: Payer: Self-pay

## 2018-02-27 NOTE — Telephone Encounter (Signed)
Follow up    Patient calling, states he has picked up paperwork but has additional questions. Please call

## 2018-02-27 NOTE — Telephone Encounter (Signed)
Called Pt to inform [place] paperwork has been faxed over and a copy has been left at the front desk for pick up. Unable to leave message.

## 2018-02-27 NOTE — Telephone Encounter (Signed)
Returned call to pt. No answer and unable to leave message. 

## 2018-02-27 NOTE — Telephone Encounter (Signed)
Spoke with pt, copy of office note , lab results and up to date medication list placed at the front desk for pick up.

## 2018-03-14 ENCOUNTER — Telehealth: Payer: Self-pay

## 2018-03-14 NOTE — Telephone Encounter (Signed)
Called patient to make sure he received all the paperwork he needed from [place] for his [place] paperwork. Requested medical records faxed to [facility] on 02/27/2018 and patient stated they received what everything we faxed.

## 2018-03-15 ENCOUNTER — Other Ambulatory Visit (INDEPENDENT_AMBULATORY_CARE_PROVIDER_SITE_OTHER): Payer: Medicare Other

## 2018-03-15 ENCOUNTER — Telehealth: Payer: Self-pay | Admitting: Internal Medicine

## 2018-03-15 ENCOUNTER — Ambulatory Visit (INDEPENDENT_AMBULATORY_CARE_PROVIDER_SITE_OTHER): Payer: Medicare Other | Admitting: Family

## 2018-03-15 ENCOUNTER — Encounter: Payer: Self-pay | Admitting: Family

## 2018-03-15 VITALS — BP 110/78 | HR 72 | Temp 98.0°F | Ht 73.0 in | Wt 200.0 lb

## 2018-03-15 DIAGNOSIS — K5909 Other constipation: Secondary | ICD-10-CM | POA: Diagnosis not present

## 2018-03-15 DIAGNOSIS — Z125 Encounter for screening for malignant neoplasm of prostate: Secondary | ICD-10-CM

## 2018-03-15 DIAGNOSIS — K625 Hemorrhage of anus and rectum: Secondary | ICD-10-CM

## 2018-03-15 DIAGNOSIS — R351 Nocturia: Secondary | ICD-10-CM

## 2018-03-15 DIAGNOSIS — G43001 Migraine without aura, not intractable, with status migrainosus: Secondary | ICD-10-CM

## 2018-03-15 LAB — CBC WITH DIFFERENTIAL/PLATELET
BASOS PCT: 0.6 % (ref 0.0–3.0)
Basophils Absolute: 0 10*3/uL (ref 0.0–0.1)
EOS ABS: 0.2 10*3/uL (ref 0.0–0.7)
Eosinophils Relative: 2.7 % (ref 0.0–5.0)
HEMATOCRIT: 45.3 % (ref 39.0–52.0)
HEMOGLOBIN: 15.2 g/dL (ref 13.0–17.0)
LYMPHS PCT: 23.1 % (ref 12.0–46.0)
Lymphs Abs: 1.4 10*3/uL (ref 0.7–4.0)
MCHC: 33.6 g/dL (ref 30.0–36.0)
MCV: 92.8 fl (ref 78.0–100.0)
MONOS PCT: 8.3 % (ref 3.0–12.0)
Monocytes Absolute: 0.5 10*3/uL (ref 0.1–1.0)
NEUTROS ABS: 4 10*3/uL (ref 1.4–7.7)
Neutrophils Relative %: 65.3 % (ref 43.0–77.0)
PLATELETS: 159 10*3/uL (ref 150.0–400.0)
RBC: 4.88 Mil/uL (ref 4.22–5.81)
RDW: 13.6 % (ref 11.5–15.5)
WBC: 6.1 10*3/uL (ref 4.0–10.5)

## 2018-03-15 LAB — PSA: PSA: 0.76 ng/mL (ref 0.10–4.00)

## 2018-03-15 MED ORDER — SUMATRIPTAN SUCCINATE 100 MG PO TABS: 100.0000 mg | ORAL_TABLET | Freq: Once | ORAL | 11 refills | Status: DC

## 2018-03-15 NOTE — Progress Notes (Signed)
Keith Vazquez is a 67 y.o. male with the following history as recorded in EpicCare:  Patient Active Problem List   Diagnosis Date Noted  . Routine general medical examination at a health care facility 12/04/2016  . Hypokalemia 11/30/2016  . Chronic coughing 11/29/2016  . Migraine without aura and with status migrainosus, not intractable 11/29/2016  . Erectile dysfunction due to arterial insufficiency 11/29/2016  . Other hemorrhoids 02/24/2015  . BPH (benign prostatic hyperplasia) 02/24/2015  . Bilateral carpal tunnel syndrome 02/24/2015  . Atrial flutter ([place]) 03/20/2014  . Chronic systolic heart failure ([place]) 11/28/2013  . Coronary artery disease   . Dyspnea 09/30/2013  . PUD (peptic ulcer disease) 07/22/2013  . Rhinitis 07/22/2013  . Atrial fibrillation ([place]) 10/21/2011  . Hyperlipidemia with target low density lipoprotein (LDL) cholesterol less than 70 mg/dL 08/11/2010  . ISCHEMIC CARDIOMYOPATHY 08/11/2010  . Essential hypertension 08/10/2010  . OSTEOARTHRITIS, KNEE, RIGHT 01/20/2010  . GERD 04/23/2008    Current Outpatient Medications  Medication Sig Dispense Refill  . Ascorbic Acid (VITAMIN C) 100 MG tablet Take 500 mg by mouth daily.     Marland Kitchen aspirin EC 81 MG tablet Take 1 tablet (81 mg total) by mouth daily. 90 tablet 3  . b complex vitamins capsule Take 1 capsule by mouth daily.      . carvedilol (COREG) 6.25 MG tablet Take 1 tablet each morning and 2 tablets each evening. 180 tablet 3  . Cholecalciferol (VITAMIN D) 1000 UNITS capsule Take 1,000 Units by mouth daily.      Marland Kitchen co-enzyme Q-10 30 MG capsule Take 100 mg by mouth 3 (three) times daily.    Marland Kitchen ENTRESTO 24-26 MG TAKE 1 TABLET BY MOUTH TWICE DAILY. 180 tablet 3  . HYDROcodone-acetaminophen (NORCO) 7.5-325 MG tablet     . niacin 250 MG CR capsule Take 250 mg by mouth daily.      . Omega-3 Fatty Acids (FISH OIL PO) Take 2,400 mg by mouth daily.     . SUMAtriptan (IMITREX) 100 MG tablet Take 1 tablet (100 mg total) by  mouth once for 1 dose. May repeat in 2 hours if headache persists or recurs. 10 tablet 11  . vitamin E 200 UNIT capsule Take 200 Units by mouth daily.     No current facility-administered medications for this visit.     Allergies: Amiodarone; Oxycodone-acetaminophen; and Propoxyphene n-acetaminophen  Past Medical History:  Diagnosis Date  . Atrial flutter ([place])   . CHF (congestive heart failure) (HCC)    LVEF at time of cath 2002 50%; now has been 35-40% since 2008 (patient has seen multiple cardiologists over the years including [facility], [place], [facility], [facility] and Tollie Eth, MD)  . Coronary artery disease    CABG x5 in 2000; s/p cath 2002 > 4/5 grafts patent, LVEF 50%  . Degeneration of lumbar intervertebral disc   . GERD (gastroesophageal reflux disease)   . GI bleed    duodenal ulcer  . Hypertension    Borderline  . Ischemic heart disease    s/p MI, CABG 2000, s/p cath 2002 with 4/5 grafts patent (LIMA to LAD atretic and occluded mid vessel but adequate flow to distal LAD from collaterals / diagonal  . Low back pain   . PAC (premature atrial contraction)   . PAF (paroxysmal atrial fibrillation) ([place])    initially diagnosed during stress test 2002  . Peyronie's disease     Past Surgical History:  Procedure Laterality Date  .  ABLATION  05-06-2014   PVI and CTI ablation by Dr [name]  . ATRIAL FIBRILLATION ABLATION N/A 05/06/2014   Procedure: ATRIAL FIBRILLATION ABLATION;  Surgeon: [name], MD;  Location: [place] CATH LAB;  Service: Cardiovascular;  Laterality: N/A;  . CARDIAC CATHETERIZATION  09/14/2001    Mildly decreased left ventricular systolic function --  Native three vessel coronary artery disease as described. -- Status post coronary artery bypass grafting  . CORONARY ARTERY BYPASS GRAFT  2000   LIMA to LAD, SVG to second diagonal, seq SVG to ramus and OM, SVG to PDA  . HERNIA REPAIR  05/16/2001   Large left indirect inguinal  hernia with right direct hernia.  Marland Kitchen HERNIA REPAIR     Recurrent left inguinal hernia -- Large left indirect inguinal hernia with right direct hernia.  Marland Kitchen LEFT HEART CATHETERIZATION WITH CORONARY/GRAFT ANGIOGRAM  11/26/2013   Procedure: LEFT HEART CATHETERIZATION WITH [name];  Surgeon: Peter M Martinique, MD;  Location: [facility] CATH LAB;  Service: Cardiovascular;;  . TEE WITHOUT CARDIOVERSION N/A 05/06/2014   Procedure: TRANSESOPHAGEAL ECHOCARDIOGRAM (TEE);  Surgeon: [name], MD;  Location: [facility] ENDOSCOPY;  Service: Cardiovascular;  Laterality: N/A;    Family History  Adopted: Yes  Family history unknown: Yes    Social History   Tobacco Use  . Smoking status: Former Smoker    Packs/day: 1.00    Years: 20.00    Pack years: 20.00    Types: Cigarettes    Last attempt to quit: 10/31/1994    Years since quitting: 23.3  . Smokeless tobacco: Never Used  Substance Use Topics  . Alcohol use: No    Alcohol/week: 0.0 oz    Subjective:  Patient presents with recurrent/ chronic problems with constipation; this is not a new issue for the patient- has seen GI multiple times with these concerns;  describes sensation as incomplete emptying of bowel-- feel like I need to have a bowel movement and there is nothing there;  last seen by GI in 2016; notes that 2 weeks ago, had a "very severe episode" and decided to come in and discuss treatment options; has been unable to have colonoscopy in the past due to transportation issues/ needing someone to sit with him during and after procedure; denies any rectal bleeding but notes that did see some blood on the tissue; is requesting to see a different GI than who he has worked with in the past;   Sees his PCP as needed; majority of healthcare needs managed by his cardiologist; 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  Head: Normocephalic and atraumatic  Lungs: Respirations unlabored; clear to auscultation bilaterally without wheeze, rales, rhonchi  Abdomen: Soft; nontender; nondistended; normoactive bowel sounds; no masses or hepatosplenomegaly  Neurologic: Alert and oriented; speech intact; face symmetrical; moves all extremities well; CNII-XII intact without focal deficit   Assessment:  1. Chronic constipation   2. Rectal bleeding   3. Migraine without aura and with status migrainosus, not intractable   4. Prostate cancer screening   5. Nocturia     Plan:  1. Not a new  issue for patient- had recent episode that was more problematic; refer to GI per his request; 2. Check CBC today; Refer to GI; 3. Patient asking for 1 year refills on his Imitrex- may use Imitrex 3 x per month; 4. & 5. Check PSA today;    No follow-ups on file.  Orders Placed This Encounter  Procedures  . CBC w/Diff    Standing Status:   Future    Number of Occurrences:   1    Standing Expiration Date:   03/15/2019  . PSA    Standing Status:   Future    Number of Occurrences:   1    Standing Expiration Date:   03/15/2019  . Ambulatory referral to Gastroenterology    Referral Priority:   Routine    Referral Type:   Consultation    Referral Reason:   Specialty Services Required    Number of Visits Requested:   1    Requested Prescriptions   Signed Prescriptions Disp Refills  . SUMAtriptan (IMITREX) 100 MG tablet 10 tablet 11    Sig: Take 1 tablet (100 mg total) by mouth once for 1 dose. May repeat in 2 hours if headache persists or recurs.

## 2018-03-15 NOTE — Telephone Encounter (Signed)
Dr. Henrene Pastor see note below. Will you approve this switch?

## 2018-03-15 NOTE — Telephone Encounter (Signed)
Received referral for patient to be seen for Chronic constipation. Patient requesting to switch from Dr.Perry to Dr.Pyrtle due to his close friend who is a retired Art gallery manager, referring him to [place]. Is this switch okay?

## 2018-03-19 NOTE — Telephone Encounter (Signed)
See comment below.

## 2018-03-19 NOTE — Telephone Encounter (Signed)
sure

## 2018-03-19 NOTE — Telephone Encounter (Signed)
Dr. Pyrtle will you accept this pt? Please see notes below. 

## 2018-03-19 NOTE — Telephone Encounter (Signed)
Routing back to RN

## 2018-03-19 NOTE — Telephone Encounter (Signed)
ok For OV may need to 1st see an APP due to limited availability of my office appts in the coming weeks

## 2018-03-20 NOTE — Telephone Encounter (Signed)
Patient was called and scheduled with APP due to lack of appts with Dr.Pyrtle open.

## 2018-04-11 ENCOUNTER — Telehealth: Payer: Self-pay | Admitting: *Deleted

## 2018-04-11 ENCOUNTER — Encounter: Payer: Self-pay | Admitting: Physician Assistant

## 2018-04-11 ENCOUNTER — Ambulatory Visit (INDEPENDENT_AMBULATORY_CARE_PROVIDER_SITE_OTHER): Payer: Medicare Other | Admitting: Physician Assistant

## 2018-04-11 VITALS — BP 116/72 | HR 78 | Ht 73.5 in | Wt 195.2 lb

## 2018-04-11 DIAGNOSIS — R1084 Generalized abdominal pain: Secondary | ICD-10-CM | POA: Diagnosis not present

## 2018-04-11 DIAGNOSIS — Z8711 Personal history of peptic ulcer disease: Secondary | ICD-10-CM | POA: Diagnosis not present

## 2018-04-11 DIAGNOSIS — K5900 Constipation, unspecified: Secondary | ICD-10-CM | POA: Diagnosis not present

## 2018-04-11 DIAGNOSIS — I255 Ischemic cardiomyopathy: Secondary | ICD-10-CM

## 2018-04-11 DIAGNOSIS — R1013 Epigastric pain: Secondary | ICD-10-CM | POA: Diagnosis not present

## 2018-04-11 DIAGNOSIS — Z1211 Encounter for screening for malignant neoplasm of colon: Secondary | ICD-10-CM | POA: Diagnosis not present

## 2018-04-11 MED ORDER — PANTOPRAZOLE SODIUM 40 MG PO TBEC: DELAYED_RELEASE_TABLET | ORAL | 8 refills | Status: DC

## 2018-04-11 MED ORDER — GLYCOPYRROLATE 2 MG PO TABS: ORAL_TABLET | ORAL | 8 refills | Status: DC

## 2018-04-11 NOTE — Patient Instructions (Addendum)
You have been scheduled for an endoscopy and colonoscopy. Please follow the written instructions given to you at your visit today. Please pick up your prep supplies at the pharmacy within the next 1-3 days. If you use inhalers (even only as needed), please bring them with you on the day of your procedure. Your physician has requested that you go to www.startemmi.com and enter the access code given to you at your visit today. This web site gives a general overview about your procedure. However, you should still follow specific instructions given to you by our office regarding your preparation for the procedure.  Try IBGARD, over the counter at pharmacies, [employer], [place] provided. Take 2 tab by mouth twice daily.   If you are age 69 or older, your body mass index should be between 23-30. Your Body mass index is 25.4 kg/m. If this is out of the aforementioned range listed, please consider follow up with your Primary Care Provider.

## 2018-04-11 NOTE — Progress Notes (Addendum)
Subjective:    Patient ID: Keith Vazquez, male    DOB: 1951/02/03, 67 y.o.   MRN: [id number]  HPI Keith Vazquez is a 67 year old white male, known to Dr. Henrene Pastor, who was last seen in our office in 2016.  He comes in today with complaints of constipation, fecal urgency symptoms without regular bowel movements, excessive straining, and some more recent mid abdominal discomfort which has improved with a trial of over-the-counter omeprazole 20 mg daily. The time of last office visit he was scheduled for colonoscopy for screening as he had never had colonoscopy.  He did not follow through with this.  He expressed frustration when I brought this up today. Patient has history of hypertension, atrial fibrillation, coronary artery disease, history of migraine headaches, remote history of peptic ulcer disease in 1997, osteoarthritis and ischemic cardiomyopathy with EF of 40 to 45%.  He is currently on Entresto. Patient states that he has had long-standing issues with his bowels for years almost constant sense of need to have a bowel movement, however when he attempts to have a bowel movement he has a lot of straining, pressure etc. and frequently is unable to produce a bowel movement.  About 6 to 7 weeks ago he says the symptoms got worse which is why he made this appointment.  He has not had any hematochezia but says he did have some dark stools.  On further questioning he had been taking Pepto-Bismol fairly regularly because it seems to help his abdominal discomfort.  He put himself on a diet over the past couple of months and has lost 6 or 7 pounds.  He placed himself on OTC Prilosec about 3 weeks ago with improvement in symptoms.  His has no complaints of nausea or vomiting, no dysphagia or odynophagia.  He is not taking any regular aspirin or NSAIDs.  Occasionally will take OTC Aleve. He feels that his problem is actually in his stomach and not in his intestine because he has had significant improvement in symptoms  with omeprazole.  He says he always feels an almost immediate need to have a bowel movement after eating, and is frequently uncomfortable in the evenings after eating dinner.  Despite this so usually only have a bowel movement every 3 to 4 days over the past few years. He feels that he needs an upper endoscopy and states that he will be agreeable to a colonoscopy, if they can be scheduled simultaneously.  Review of Systems Pertinent positive and negative review of systems were noted in the above HPI section.  All other review of systems was otherwise negative.  Outpatient Encounter Medications as of 04/11/2018  Medication Sig  . Ascorbic Acid (VITAMIN C) 100 MG tablet Take 500 mg by mouth daily.   Marland Kitchen aspirin EC 81 MG tablet Take 1 tablet (81 mg total) by mouth daily.  Marland Kitchen b complex vitamins capsule Take 1 capsule by mouth daily.    . carvedilol (COREG) 6.25 MG tablet Take 1 tablet each morning and 2 tablets each evening.  . Cholecalciferol (VITAMIN D) 1000 UNITS capsule Take 1,000 Units by mouth daily.    Marland Kitchen co-enzyme Q-10 30 MG capsule Take 100 mg by mouth 3 (three) times daily.  Marland Kitchen ENTRESTO 24-26 MG TAKE 1 TABLET BY MOUTH TWICE DAILY.  . ferrous sulfate 325 (65 FE) MG tablet Take 325 mg by mouth daily with breakfast.  . niacin 250 MG CR capsule Take 250 mg by mouth daily.    . Omega-3 Fatty Acids (  FISH OIL PO) Take 2,400 mg by mouth daily.   Marland Kitchen omeprazole (PRILOSEC) 20 MG capsule Take 20 mg by mouth daily.  . vitamin E 200 UNIT capsule Take 200 Units by mouth daily.  Marland Kitchen glycopyrrolate (ROBINUL) 2 MG tablet Take 1 tab by mouth twice daily.  . pantoprazole (PROTONIX) 40 MG tablet Take 1 tab by mouth every morning.  . SUMAtriptan (IMITREX) 100 MG tablet Take 1 tablet (100 mg total) by mouth once for 1 dose. May repeat in 2 hours if headache persists or recurs.  . [DISCONTINUED] HYDROcodone-acetaminophen ([place]) 7.5-325 MG tablet    No facility-administered encounter medications on file as of  04/11/2018.    Allergies  Allergen Reactions  . Amiodarone Other (See Comments)    Left permanent scar on eye  . Oxycodone-Acetaminophen Nausea Only  . Propoxyphene N-Acetaminophen Nausea Only   Patient Active Problem List   Diagnosis Date Noted  . Routine general medical examination at a health care facility 12/04/2016  . Hypokalemia 11/30/2016  . Chronic coughing 11/29/2016  . Migraine without aura and with status migrainosus, not intractable 11/29/2016  . Erectile dysfunction due to arterial insufficiency 11/29/2016  . Other hemorrhoids 02/24/2015  . BPH (benign prostatic hyperplasia) 02/24/2015  . Bilateral carpal tunnel syndrome 02/24/2015  . Atrial flutter ([place]) 03/20/2014  . Chronic systolic heart failure ([place]) 11/28/2013  . Coronary artery disease   . Dyspnea 09/30/2013  . PUD (peptic ulcer disease) 07/22/2013  . Rhinitis 07/22/2013  . Atrial fibrillation ([place]) 10/21/2011  . Hyperlipidemia with target low density lipoprotein (LDL) cholesterol less than 70 mg/dL 08/11/2010  . ISCHEMIC CARDIOMYOPATHY 08/11/2010  . Essential hypertension 08/10/2010  . OSTEOARTHRITIS, KNEE, RIGHT 01/20/2010  . GERD 04/23/2008   Social History   Socioeconomic History  . Marital status: Single    Spouse name: Not on file  . Number of children: 0  . Years of education: Not on file  . Highest education level: Not on file  Occupational History  . Occupation: garage Insurance claims handler  Social Needs  . Financial resource strain: Not on file  . Food insecurity:    Worry: Not on file    Inability: Not on file  . Transportation needs:    Medical: Not on file    Non-medical: Not on file  Tobacco Use  . Smoking status: Former Smoker    Packs/day: 1.00    Years: 20.00    Pack years: 20.00    Types: Cigarettes    Last attempt to quit: 10/31/1994    Years since quitting: 23.4  . Smokeless tobacco: Never Used  Substance and Sexual Activity  . Alcohol use: No    Alcohol/week: 0.0 oz    . Drug use: No  . Sexual activity: Not on file  Lifestyle  . Physical activity:    Days per week: Not on file    Minutes per session: Not on file  . Stress: Not on file  Relationships  . Social connections:    Talks on phone: Not on file    Gets together: Not on file    Attends religious service: Not on file    Active member of club or organization: Not on file    Attends meetings of clubs or organizations: Not on file    Relationship status: Not on file  . Intimate partner violence:    Fear of current or ex partner: Not on file    Emotionally abused: Not on file    Physically abused: Not on  file    Forced sexual activity: Not on file  Other Topics Concern  . Not on file  Social History Narrative   Lives in [place].  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  Also flies airplanes    Keith Vazquez He was adopted. Family history is unknown by patient.      Objective:    Vitals:   04/11/18 1009  BP: 116/72  Pulse: 78    Physical Exam; well-developed older white male in no acute distress, blood pressure 116/72 pulse 78, height 6 foot 1, weight 195, BMI of 25.4.  HEENT; nontraumatic normocephalic EOMI PERRLA sclera anicteric, Oropharynx benign, Cardiovascular; regular rate and rhythm with S1-S2 no murmur rub or gallop, Pulmonary ;clear bilaterally, Abdomen; soft, no focal tenderness, no palpable mass or hepatosplenomegaly bowel sounds are present, Rectal; exam not done, Extremities; no clubbing cyanosis or edema skin warm and dry, Neuro psych ;alert and oriented, grossly nonfocal mood and affect appropriate       Assessment & Plan:   #90 68 year old white male with remote history of duodenal ulcer 1997 who comes in with recent complaints of mid abdominal discomfort and long-standing postprandial urgency has worsened recently.  Patient has had improvement in recent symptoms with a trial of OTC omeprazole. Rule out recurrent peptic ulcer disease, gastropathy, nonulcer  dyspepsia  #2 chronic constipation-, despite postprandial urgency sensation patient has bowel movement to 3 to 4 days, and frequent mid to lower abdominal discomfort. Etiology is not clear, this may be manifestation of IBS, rule out pelvic floor dyssynergy  #3 Colon cancer surveillance-patient has not had prior colonoscopy #4 ischemic cardiomyopathy/EF 40 to 45% #5 coronary artery disease #6.  Hypertension #7.  Osteoarthritis  Plan; Continue PPI, will give patient a trial of Protonix 40 mg p.o. every morning, prescription sent Trial of glycopyrrolate 2 mg p.o. twice daily.  Rx sent He was also advised he could try IBgard 2 p.o. twice daily, and continue if  helpful Patient will be scheduled for upper endoscopy and colonoscopy with Dr. Henrene Pastor.  Procedure was discussed in detail with patient including indications risks and benefits and he is agreeable to proceed.   After visit with the patient, he stated that he had requested to switch providers to Dr. Hilarie Fredrickson.  I was not aware of this.  Initially his procedures had been scheduled with Dr. Henrene Pastor, we immediately offered procedures to be scheduled with Dr. Hilarie Fredrickson however became upset as his schedule was out a bit further and patient left the office without scheduling.   Amani Marseille S Yuta Cipollone PA-C 04/11/2018   Cc: Marrian Salvage,*   Addendum: Reviewed and agree with initial management. I agree with the recommended upper endoscopy and colonoscopy. Unfortunately the patient left without scheduling procedures and it is apparent he was upset.  Hopefully he will follow our recommendations and call back to schedule the recommended procedures Pyrtle, Lajuan Lines, MD

## 2018-04-11 NOTE — Telephone Encounter (Signed)
[place] to advise the Glycopyrrolate 2 mg the prior authrization was approved. PA # [id number]. The pharmacist said the prescription went through.

## 2018-04-11 NOTE — Progress Notes (Signed)
[name] was unhappy that PA was not aware of provider change from Dr. Scarlette Shorts to Dr. Zenovia Jarred. We scheduled him today for an EGD/COLON with Dr. Scarlette Shorts.  I explained the procedure and when he discovered we scheduled him with Dr. Henrene Pastor instead of Dr. Hilarie Fredrickson he was upset.  When I told him I would change it to Dr. Hilarie Fredrickson and gave him the [place] schedule to choose a date with Dr. Hilarie Fredrickson he was not happy that Dr. Hilarie Fredrickson was out until the middle of August for a double procedure. The patient decided to leave the office without an appointment for the procedure.

## 2018-04-18 DIAGNOSIS — K219 Gastro-esophageal reflux disease without esophagitis: Secondary | ICD-10-CM | POA: Diagnosis not present

## 2018-04-18 DIAGNOSIS — R194 Change in bowel habit: Secondary | ICD-10-CM | POA: Diagnosis not present

## 2018-04-18 DIAGNOSIS — R634 Abnormal weight loss: Secondary | ICD-10-CM | POA: Diagnosis not present

## 2018-04-19 ENCOUNTER — Encounter: Payer: Medicare Other | Admitting: Internal Medicine

## 2018-04-24 ENCOUNTER — Emergency Department (HOSPITAL_COMMUNITY)
Admission: EM | Admit: 2018-04-24 | Discharge: 2018-04-24 | Disposition: A | Discharge status: Home / Self Care | Payer: Medicare Other | Attending: Emergency Medicine | Admitting: Emergency Medicine

## 2018-04-24 ENCOUNTER — Emergency Department (HOSPITAL_COMMUNITY): Payer: Medicare Other

## 2018-04-24 ENCOUNTER — Other Ambulatory Visit: Payer: Self-pay

## 2018-04-24 ENCOUNTER — Encounter (HOSPITAL_COMMUNITY): Payer: Self-pay | Admitting: Emergency Medicine

## 2018-04-24 ENCOUNTER — Encounter: Payer: Medicare Other | Admitting: Internal Medicine

## 2018-04-24 DIAGNOSIS — Z87891 Personal history of nicotine dependence: Secondary | ICD-10-CM | POA: Insufficient documentation

## 2018-04-24 DIAGNOSIS — Z79899 Other long term (current) drug therapy: Secondary | ICD-10-CM | POA: Insufficient documentation

## 2018-04-24 DIAGNOSIS — R1013 Epigastric pain: Secondary | ICD-10-CM | POA: Diagnosis present

## 2018-04-24 DIAGNOSIS — K529 Noninfective gastroenteritis and colitis, unspecified: Secondary | ICD-10-CM | POA: Insufficient documentation

## 2018-04-24 DIAGNOSIS — R1084 Generalized abdominal pain: Secondary | ICD-10-CM | POA: Diagnosis not present

## 2018-04-24 DIAGNOSIS — R638 Other symptoms and signs concerning food and fluid intake: Secondary | ICD-10-CM | POA: Diagnosis not present

## 2018-04-24 DIAGNOSIS — I483 Typical atrial flutter: Secondary | ICD-10-CM | POA: Diagnosis not present

## 2018-04-24 DIAGNOSIS — Z951 Presence of aortocoronary bypass graft: Secondary | ICD-10-CM | POA: Insufficient documentation

## 2018-04-24 DIAGNOSIS — R110 Nausea: Secondary | ICD-10-CM | POA: Diagnosis not present

## 2018-04-24 DIAGNOSIS — R52 Pain, unspecified: Secondary | ICD-10-CM | POA: Diagnosis not present

## 2018-04-24 DIAGNOSIS — I509 Heart failure, unspecified: Secondary | ICD-10-CM | POA: Diagnosis not present

## 2018-04-24 DIAGNOSIS — I2510 Atherosclerotic heart disease of native coronary artery without angina pectoris: Secondary | ICD-10-CM | POA: Diagnosis not present

## 2018-04-24 DIAGNOSIS — R1010 Upper abdominal pain, unspecified: Secondary | ICD-10-CM | POA: Diagnosis not present

## 2018-04-24 DIAGNOSIS — I252 Old myocardial infarction: Secondary | ICD-10-CM | POA: Diagnosis not present

## 2018-04-24 DIAGNOSIS — N200 Calculus of kidney: Secondary | ICD-10-CM | POA: Diagnosis not present

## 2018-04-24 LAB — BASIC METABOLIC PANEL
Anion gap: 11 (ref 5–15)
BUN: 15 mg/dL (ref 8–23)
CALCIUM: 10.1 mg/dL (ref 8.9–10.3)
CO2: 21 mmol/L — AB (ref 22–32)
CREATININE: 1.04 mg/dL (ref 0.61–1.24)
Chloride: 106 mmol/L (ref 98–111)
GFR calc non Af Amer: >60 mL/min (ref >60)
Glucose, Bld: 139 mg/dL — ABNORMAL HIGH (ref 70–99)
Potassium: 4 mmol/L (ref 3.5–5.1)
Sodium: 138 mmol/L (ref 135–145)

## 2018-04-24 LAB — CBC WITH DIFFERENTIAL/PLATELET
BASOS PCT: 0 %
Basophils Absolute: 0 10*3/uL (ref 0.0–0.1)
Eosinophils Absolute: 0 10*3/uL (ref 0.0–0.7)
Eosinophils Relative: 0 %
HEMATOCRIT: 48.2 % (ref 39.0–52.0)
Hemoglobin: 16.6 g/dL (ref 13.0–17.0)
LYMPHS ABS: 1.4 10*3/uL (ref 0.7–4.0)
Lymphocytes Relative: 9 %
MCH: 31.7 pg (ref 26.0–34.0)
MCHC: 34.4 g/dL (ref 30.0–36.0)
MCV: 92.2 fL (ref 78.0–100.0)
MONO ABS: 0.7 10*3/uL (ref 0.1–1.0)
Monocytes Relative: 5 %
Neutro Abs: 12.6 10*3/uL — ABNORMAL HIGH (ref 1.7–7.7)
Neutrophils Relative %: 86 %
Platelets: 185 10*3/uL (ref 150–400)
RBC: 5.23 MIL/uL (ref 4.22–5.81)
RDW: 13.4 % (ref 11.5–15.5)
WBC: 14.7 10*3/uL — ABNORMAL HIGH (ref 4.0–10.5)

## 2018-04-24 LAB — LIPASE, BLOOD: Lipase: 31 U/L (ref 11–51)

## 2018-04-24 MED ORDER — ONDANSETRON HCL 4 MG PO TABS: 4.0000 mg | ORAL_TABLET | Freq: Four times a day (QID) | ORAL | 0 refills | Status: DC

## 2018-04-24 MED ORDER — IOPAMIDOL (ISOVUE-300) INJECTION 61%
100.0000 mL | Freq: Once | INTRAVENOUS | Status: AC | PRN
  Administered 2018-04-24: 100 mL via INTRAVENOUS

## 2018-04-24 MED ORDER — IOPAMIDOL (ISOVUE-300) INJECTION 61%
INTRAVENOUS | Status: AC
  Administered 2018-04-24: 11:00:00
  Filled 2018-04-24: qty 100

## 2018-04-24 MED ORDER — IOHEXOL 300 MG/ML  SOLN
30.0000 mL | Freq: Once | INTRAMUSCULAR | Status: AC | PRN
Start: 2018-04-24 — End: 2018-04-24
  Administered 2018-04-24: 30 mL via ORAL

## 2018-04-24 NOTE — ED Triage Notes (Addendum)
Pt arriving via EMS with abdominal pain x2 days. Pt states he has also been constipated for a couple days. Pt has colonoscopy scheduled in a few days. Pt ambulatory. Pt stated [place] "[place] and called EMS so that he could be seen faster and he did not want to wait".

## 2018-04-24 NOTE — ED Notes (Signed)
Provided update.

## 2018-04-24 NOTE — ED Notes (Signed)
Pt ambulating back and forth from [facility], unable to urinate at this time, only having bowel movements.

## 2018-04-24 NOTE — ED Provider Notes (Signed)
[place] DEPT Provider Note   CSN: [id number] Arrival date & time: 04/24/18  0204     History   Chief Complaint Chief Complaint  Patient presents with  . Abdominal Pain    HPI Keith Vazquez is a 67 y.o. male.  HPI   67 year old male with history of PUD, CAD, atrial flutter not on any anticoagulant, cardiac disease brought here via EMS from home for evaluation of abdominal pain.  Patient report for the past 2 months he has had recurrent upper abdominal pain.  Pain is usually brought on by eating.  He report decreased appetite and afraid to eat due to his symptoms.  He was seen by his gastroenterologist for this recently and is scheduled to have an endoscopy and colonoscopy next week.  For the past 4 days he has had worsening abdominal pain.  Pain is sharp, radiated across his abdomen, with associated nausea and having loose stools without blood or mucus.  Pain intensified last night, moderate to severe but has since subside.  Pain is currently 2 out of 10.  He denies any associated fever, chills, chest pain, trouble breathing, productive cough, back pain, dysuria.  His last bowel movement was earlier this morning.  Patient denies alcohol abuse or NSAIDs abuse.     Past Medical History:  Diagnosis Date  . Atrial flutter ([place])   . CHF (congestive heart failure) (HCC)    LVEF at time of cath 2002 50%; now has been 35-40% since 2008 (patient has seen multiple cardiologists over the years including [facility] [place], [place] [facility], [facility] and Tollie Eth, MD)  . Coronary artery disease    CABG x5 in 2000; s/p cath 2002 > 4/5 grafts patent, LVEF 50%  . Degeneration of lumbar intervertebral disc   . GERD (gastroesophageal reflux disease)   . GI bleed    duodenal ulcer  . Hypertension    Borderline  . Ischemic heart disease    s/p MI, CABG 2000, s/p cath 2002 with 4/5 grafts patent (LIMA to LAD atretic and occluded  mid vessel but adequate flow to distal LAD from collaterals / diagonal  . Low back pain   . PAC (premature atrial contraction)   . PAF (paroxysmal atrial fibrillation) ([place])    initially diagnosed during stress test 2002  . Peyronie's disease     Patient Active Problem List   Diagnosis Date Noted  . Routine general medical examination at a health care facility 12/04/2016  . Hypokalemia 11/30/2016  . Chronic coughing 11/29/2016  . Migraine without aura and with status migrainosus, not intractable 11/29/2016  . Erectile dysfunction due to arterial insufficiency 11/29/2016  . Other hemorrhoids 02/24/2015  . BPH (benign prostatic hyperplasia) 02/24/2015  . Bilateral carpal tunnel syndrome 02/24/2015  . Atrial flutter ([place]) 03/20/2014  . Chronic systolic heart failure ([place]) 11/28/2013  . Coronary artery disease   . Dyspnea 09/30/2013  . PUD (peptic ulcer disease) 07/22/2013  . Rhinitis 07/22/2013  . Atrial fibrillation ([place]) 10/21/2011  . Hyperlipidemia with target low density lipoprotein (LDL) cholesterol less than 70 mg/dL 08/11/2010  . ISCHEMIC CARDIOMYOPATHY 08/11/2010  . Essential hypertension 08/10/2010  . OSTEOARTHRITIS, KNEE, RIGHT 01/20/2010  . GERD 04/23/2008    Past Surgical History:  Procedure Laterality Date  . ABLATION  05-06-2014   PVI and CTI ablation by Dr [name]  . ATRIAL FIBRILLATION ABLATION N/A 05/06/2014   Procedure: ATRIAL FIBRILLATION ABLATION;  Surgeon: [name], MD;  Location: [facility] CATH  LAB;  Service: Cardiovascular;  Laterality: N/A;  . CARDIAC CATHETERIZATION  09/14/2001    Mildly decreased left ventricular systolic function --  Native three vessel coronary artery disease as described. -- Status post coronary artery bypass grafting  . CORONARY ARTERY BYPASS GRAFT  2000   LIMA to LAD, SVG to second diagonal, seq SVG to ramus and OM, SVG to PDA  . HERNIA REPAIR  05/16/2001   Large left indirect inguinal hernia with right direct hernia.  Marland Kitchen HERNIA REPAIR      Recurrent left inguinal hernia -- Large left indirect inguinal hernia with right direct hernia.  Marland Kitchen LEFT HEART CATHETERIZATION WITH CORONARY/GRAFT ANGIOGRAM  11/26/2013   Procedure: LEFT HEART CATHETERIZATION WITH [name];  Surgeon: Peter M Martinique, MD;  Location: [facility] CATH LAB;  Service: Cardiovascular;;  . TEE WITHOUT CARDIOVERSION N/A 05/06/2014   Procedure: TRANSESOPHAGEAL ECHOCARDIOGRAM (TEE);  Surgeon: [name], MD;  Location: [facility] ENDOSCOPY;  Service: Cardiovascular;  Laterality: N/A;        Home Medications    Prior to Admission medications   Medication Sig Start Date End Date Taking? Authorizing Provider  Ascorbic Acid (VITAMIN C) 100 MG tablet Take 500 mg by mouth daily.     [provider]  aspirin EC 81 MG tablet Take 1 tablet (81 mg total) by mouth daily. 11/02/17   Barrett, Evelene Croon, PA-C  b complex vitamins capsule Take 1 capsule by mouth daily.      [provider]  carvedilol (COREG) 6.25 MG tablet Take 1 tablet each morning and 2 tablets each evening. 12/06/16   Lelon Perla, MD  Cholecalciferol (VITAMIN D) 1000 UNITS capsule Take 1,000 Units by mouth daily.      [provider]  co-enzyme Q-10 30 MG capsule Take 100 mg by mouth 3 (three) times daily.    [provider]  ENTRESTO 24-26 MG TAKE 1 TABLET BY MOUTH TWICE DAILY. 01/29/18   Lelon Perla, MD  ferrous sulfate 325 (65 FE) MG tablet Take 325 mg by mouth daily with breakfast.    [provider]  glycopyrrolate (ROBINUL) 2 MG tablet Take 1 tab by mouth twice daily. 04/11/18   Esterwood, Amy S, PA-C  niacin 250 MG CR capsule Take 250 mg by mouth daily.      [provider]  Omega-3 Fatty Acids (FISH OIL PO) Take 2,400 mg by mouth daily.     [provider]  omeprazole (PRILOSEC) 20 MG capsule Take 20 mg by mouth daily.    [provider]  pantoprazole (PROTONIX) 40 MG tablet Take 1 tab by mouth every morning. 04/11/18    Esterwood, Amy S, PA-C  SUMAtriptan (IMITREX) 100 MG tablet Take 1 tablet (100 mg total) by mouth once for 1 dose. May repeat in 2 hours if headache persists or recurs. 03/15/18 03/15/18  Marrian Salvage, FNP  vitamin E 200 UNIT capsule Take 200 Units by mouth daily.    [provider]    Family History Family History  Adopted: Yes  Family history unknown: Yes    Social History Social History   Tobacco Use  . Smoking status: Former Smoker    Packs/day: 1.00    Years: 20.00    Pack years: 20.00    Types: Cigarettes    Last attempt to quit: 10/31/1994    Years since quitting: 23.4  . Smokeless tobacco: Never Used  Substance Use Topics  . Alcohol use: No    Alcohol/week:  0.0 oz  . Drug use: No     Allergies   Amiodarone; Oxycodone-acetaminophen; and Propoxyphene n-acetaminophen   Review of Systems Review of Systems  All other systems reviewed and are negative.    Physical Exam Updated Vital Signs BP 122/89 (BP Location: Left Arm)   Pulse 86   Temp 98.1 F (36.7 C) (Oral)   Resp 14   Ht 6\' 1"  (1.854 m)   Wt 86.2 kg (190 lb)   SpO2 99%   BMI 25.07 kg/m   Physical Exam  Constitutional: He appears well-developed and well-nourished. No distress.  Elderly male resting comfortably in bed in no acute discomfort.  HENT:  Head: Atraumatic.  Eyes: Conjunctivae are normal.  Neck: Neck supple.  Cardiovascular: Normal rate and regular rhythm.  Pulmonary/Chest: Effort normal and breath sounds normal.  Abdominal: Soft. Normal appearance and bowel sounds are normal. There is tenderness in the epigastric area.  Neurological: He is alert.  Skin: No rash noted.  Psychiatric: He has a normal mood and affect.  Nursing note and vitals reviewed.    ED Treatments / Results  Labs (all labs ordered are listed, but only abnormal results are displayed) Labs Reviewed  CBC WITH DIFFERENTIAL/PLATELET - Abnormal; Notable for the following components:      Result  Value   WBC 14.7 (*)    Neutro Abs 12.6 (*)    All other components within normal limits  BASIC METABOLIC PANEL - Abnormal; Notable for the following components:   CO2 21 (*)    Glucose, Bld 139 (*)    All other components within normal limits  LIPASE, BLOOD  URINALYSIS, ROUTINE W REFLEX MICROSCOPIC    EKG None  Radiology CT ABDOMEN PELVIS W CONTRAST (Final result)  Result time 04/24/18 10:57:16  Final result by Logan Bores, MD (04/24/18 10:57:16)           Narrative:   CLINICAL DATA: Abdominal pain for 2 days. Constipation.  EXAM: CT ABDOMEN AND PELVIS WITH CONTRAST  TECHNIQUE: Multidetector CT imaging of the abdomen and pelvis was performed using the standard protocol following bolus administration of intravenous contrast.  CONTRAST: 126mL ISOVUE-300 IOPAMIDOL (ISOVUE-300) INJECTION 61%  COMPARISON: None.  FINDINGS: Lower chest: Numerous peripheral micronodules throughout both basilar lower lobes and right middle lobe, partially tree-in-bud in configuration. No pleural effusion.  Hepatobiliary: 6 mm low-density lesion laterally in a diminutive left hepatic lobe, too small to fully characterize. Unremarkable gallbladder. No biliary dilatation.  Pancreas: Unremarkable.  Spleen: Unremarkable.  Adrenals/Urinary Tract: Unremarkable adrenal glands. 3.8 cm left lower pole, 1.7 cm left upper pole, and 1.3 cm right upper pole renal cysts. Punctate renal calculi bilaterally. No hydronephrosis. Unremarkable bladder.  Stomach/Bowel: The stomach is within normal limits. Scattered colonic diverticulosis is noted without evidence of diverticulitis. Fluid-filled loops of distal ileum in the right mid and lower abdomen demonstrate scattered air-fluid levels and are borderline dilated with a small amount of adjacent stranding and trace paracolic gutter fluid. There is no evidence of mechanical bowel obstruction. The appendix is unremarkable.  Vascular/Lymphatic:  Abdominal aortic atherosclerosis without aneurysm. No enlarged lymph nodes.  Reproductive: Unremarkable prostate.  Other: No pneumoperitoneum. No loculated fluid collection. Small fat containing paraumbilical hernia.  Musculoskeletal: No suspicious osseous lesion. Thoracolumbar disc and facet degeneration including severe disc space narrowing at L5-S1 and severe right facet arthrosis at L4-5.  IMPRESSION: 1. Mildly prominent distal small bowel loops with scattered air-fluid levels and mild surrounding inflammation, query enteritis. No bowel obstruction. 2. Nonobstructing bilateral  nephrolithiasis. 3. Numerous centrilobular nodules in both lung bases which may reflect infectious or inflammatory bronchiolitis. 4. Aortic Atherosclerosis (ICD10-I70.0).   Electronically Signed By: Logan Bores M.D. On: 04/24/2018 10:57             Procedures Procedures (including critical care time)  Medications Ordered in ED Medications  iohexol (OMNIPAQUE) 300 MG/ML solution 30 mL (30 mLs Oral Contrast Given 04/24/18 0839)  iopamidol (ISOVUE-300) 61 % injection (  Contrast Given 04/24/18 1039)  iopamidol (ISOVUE-300) 61 % injection 100 mL (100 mLs Intravenous Contrast Given 04/24/18 1023)     Initial Impression / Assessment and Plan / ED Course  I have reviewed the triage vital signs and the nursing notes.  Pertinent labs & imaging results that were available during my care of the patient were reviewed by me and considered in my medical decision making (see chart for details).     BP 111/80   Pulse (!) 58   Temp 98.1 F (36.7 C) (Oral)   Resp 14   Ht 6\' 1"  (1.854 m)   Wt 86.2 kg (190 lb)   SpO2 97%   BMI 25.07 kg/m    Final Clinical Impressions(s) / ED Diagnoses   Final diagnoses:  Enteritis    ED Discharge Orders        Ordered    ondansetron (ZOFRAN) 4 MG tablet  Every 6 hours     04/24/18 1228     7:33 AM Patient here with recurrent upper abdominal pain  which intensified for the past several days.  He is scheduled to have a endoscopy and colonoscopy next week.  He is here due to worsening pain since last night.  His pain is minimal at this time.  Labs remarkable for mild leukocytosis with a WBC 14.  Otherwise labs are reassuring.  Given his age, abdominal pelvis CT scan ordered.  He is currently afebrile with stable normal vital sign.  Does have history of constipation but has had several bouts of diarrhea recently.  Low suspicion for SBO at this time.  12:25 PM Abdominal pelvis CT scan shows mildly prominent distal small bowel loop with scattered air-fluid level and mild surrounding inflammation suspicious for enteritis but no evidence of SBO.  Nonobstructing bilateral nephrolithiasis noted.  Numerous essentially lobular nodule in both lung base which may reflect just infectious or inflammatory bronchiolitis.  Since patient denies any chest pain shortness of breath or productive cough, it is unlikely that he has pulmonary infectious symptoms.  His symptom is more consistent with enteritis.  He is resting comfortably, vital signs stable, and blood work is unremarkable.  I discussed the finding with patient.  Patient voiced understanding.  He will follow-up with his GI specialist as previously scheduled.  Return precautions discussed.  Patient stable for discharge.   Domenic Moras, PA-C 04/24/18 [place], [place], DO 04/28/18 1333

## 2018-04-24 NOTE — ED Notes (Signed)
Urinal at bedside and patient aware a urine specimen is needed.

## 2018-04-26 ENCOUNTER — Telehealth: Payer: Self-pay

## 2018-04-26 ENCOUNTER — Telehealth: Payer: Self-pay | Admitting: *Deleted

## 2018-04-26 NOTE — Telephone Encounter (Signed)
   [facility] Pre-operative Risk Assessment    Request for surgical clearance:  1. What type of surgery is being performed? EGD AND COLONOSOCPY  2. When is this surgery scheduled? 05/10/18  3. What type of clearance is required (medical clearance vs. Pharmacy clearance to hold med vs. Both)? MEDICAL  4. Are there any medications that need to be held prior to surgery and how long? N/A  5. Practice name and name of physician performing surgery? [facility]  6. What is your office phone number [phone]   7.   What is your office fax number [phone]  8.   Anesthesia type (None, local, MAC, general) ? PROPOFOL   Devra Dopp 04/26/2018, 3:42 PM  _________________________________________________________________   (provider comments below)

## 2018-04-26 NOTE — Telephone Encounter (Signed)
Transition Care Management Follow-up Telephone Call   Date discharged?  04/24/2018   How have you been since you were released from the hospital? Pt is still having some pain but he is scheduled to see Dr. Benson Norway soon for his endoscopy and colonoscopy.    Do you understand why you were in the hospital? Yes   Do you understand the discharge instructions? Yes   Where were you discharged to? Home   Items Reviewed:  Medications reviewed: yes  Allergies reviewed: yes  Dietary changes reviewed: yes  Referrals reviewed: yes   Functional Questionnaire:   Activities of Daily Living (ADLs):   States they are independent in the following: all ADL's States they require assistance with the following: n/a   Any transportation issues/concerns?: no   Any patient concerns? The amount of time that it took to get in with [facility]. Patient is upset about the treatment and length of time to get in with [facility]   Confirmed importance and date/time of follow-up visits scheduled yes  Provider Appointment booked with Dr. Benson Norway  Confirmed with patient if condition begins to worsen call PCP or go to the ER.  Patient was given the office number and encouraged to call back with question or concerns: Yes

## 2018-04-27 ENCOUNTER — Other Ambulatory Visit: Payer: Self-pay | Admitting: Physician Assistant

## 2018-04-27 MED ORDER — CARVEDILOL 12.5 MG PO TABS: 12.5000 mg | ORAL_TABLET | Freq: Two times a day (BID) | ORAL | 11 refills | Status: DC

## 2018-04-27 NOTE — Telephone Encounter (Signed)
   Primary Cardiologist: Peter Martinique, MD  Chart reviewed as part of pre-operative protocol coverage. Patient was contacted 04/27/2018 in reference to pre-operative risk assessment for pending surgery as outlined below.  GLADYS GUTMAN was last seen on 11/02/2017 by Rosaria Ferries, PA-C.  Since that day, CARNEY SAXTON has done well.  He goes to the gym on a regular basis, exercises strenuously and has no chest pain or shortness of breath.  Of note, he states that he is willing to increase his beta-blocker, will send in a new prescription for 12.5 mg twice daily.  Therefore, based on ACC/AHA guidelines, the patient would be at acceptable risk for the planned procedure without further cardiovascular testing.   I will route this recommendation to the requesting party via Epic fax function and remove from pre-op pool.  Please call with questions.  Rosaria Ferries, PA-C 04/27/2018, 10:08 AM   [place] [facility] office phone number [phone]  office fax number [phone]

## 2018-05-10 DIAGNOSIS — K5730 Diverticulosis of large intestine without perforation or abscess without bleeding: Secondary | ICD-10-CM | POA: Diagnosis not present

## 2018-05-10 DIAGNOSIS — D120 Benign neoplasm of cecum: Secondary | ICD-10-CM | POA: Diagnosis not present

## 2018-05-10 DIAGNOSIS — R634 Abnormal weight loss: Secondary | ICD-10-CM | POA: Diagnosis not present

## 2018-05-10 DIAGNOSIS — K2950 Unspecified chronic gastritis without bleeding: Secondary | ICD-10-CM | POA: Diagnosis not present

## 2018-05-10 DIAGNOSIS — K208 Other esophagitis: Secondary | ICD-10-CM | POA: Diagnosis not present

## 2018-05-10 DIAGNOSIS — K210 Gastro-esophageal reflux disease with esophagitis: Secondary | ICD-10-CM | POA: Diagnosis not present

## 2018-05-10 DIAGNOSIS — K449 Diaphragmatic hernia without obstruction or gangrene: Secondary | ICD-10-CM | POA: Diagnosis not present

## 2018-05-10 DIAGNOSIS — K2980 Duodenitis without bleeding: Secondary | ICD-10-CM | POA: Diagnosis not present

## 2018-05-10 DIAGNOSIS — R1084 Generalized abdominal pain: Secondary | ICD-10-CM | POA: Diagnosis not present

## 2018-05-10 DIAGNOSIS — R194 Change in bowel habit: Secondary | ICD-10-CM | POA: Diagnosis not present

## 2018-05-10 DIAGNOSIS — K635 Polyp of colon: Secondary | ICD-10-CM | POA: Diagnosis not present

## 2018-05-10 LAB — HM COLONOSCOPY

## 2018-05-28 ENCOUNTER — Other Ambulatory Visit (INDEPENDENT_AMBULATORY_CARE_PROVIDER_SITE_OTHER): Payer: Medicare Other

## 2018-05-28 ENCOUNTER — Ambulatory Visit (INDEPENDENT_AMBULATORY_CARE_PROVIDER_SITE_OTHER): Payer: Medicare Other | Admitting: Family

## 2018-05-28 ENCOUNTER — Encounter: Payer: Self-pay | Admitting: Family

## 2018-05-28 VITALS — BP 108/70 | HR 51 | Temp 98.2°F | Ht 73.0 in | Wt 197.0 lb

## 2018-05-28 DIAGNOSIS — R200 Anesthesia of skin: Secondary | ICD-10-CM

## 2018-05-28 DIAGNOSIS — I255 Ischemic cardiomyopathy: Secondary | ICD-10-CM

## 2018-05-28 DIAGNOSIS — R109 Unspecified abdominal pain: Secondary | ICD-10-CM | POA: Diagnosis not present

## 2018-05-28 LAB — CBC WITH DIFFERENTIAL/PLATELET
Basophils Absolute: 0 10*3/uL (ref 0.0–0.1)
Basophils Relative: 0.3 % (ref 0.0–3.0)
EOS PCT: 2.4 % (ref 0.0–5.0)
Eosinophils Absolute: 0.2 10*3/uL (ref 0.0–0.7)
HEMATOCRIT: 45 % (ref 39.0–52.0)
Hemoglobin: 15.1 g/dL (ref 13.0–17.0)
LYMPHS PCT: 21.9 % (ref 12.0–46.0)
Lymphs Abs: 1.4 10*3/uL (ref 0.7–4.0)
MCHC: 33.6 g/dL (ref 30.0–36.0)
MCV: 91.9 fl (ref 78.0–100.0)
MONOS PCT: 7 % (ref 3.0–12.0)
Monocytes Absolute: 0.4 10*3/uL (ref 0.1–1.0)
NEUTROS ABS: 4.4 10*3/uL (ref 1.4–7.7)
Neutrophils Relative %: 68.4 % (ref 43.0–77.0)
Platelets: 167 10*3/uL (ref 150.0–400.0)
RBC: 4.9 Mil/uL (ref 4.22–5.81)
RDW: 13.7 % (ref 11.5–15.5)
WBC: 6.4 10*3/uL (ref 4.0–10.5)

## 2018-05-28 LAB — COMPREHENSIVE METABOLIC PANEL
ALT: 7 U/L (ref 0–53)
AST: 17 U/L (ref 0–37)
Albumin: 4.4 g/dL (ref 3.5–5.2)
Alkaline Phosphatase: 67 U/L (ref 39–117)
BUN: 11 mg/dL (ref 6–23)
CALCIUM: 9.5 mg/dL (ref 8.4–10.5)
CO2: 28 meq/L (ref 19–32)
Chloride: 103 mEq/L (ref 96–112)
Creatinine, Ser: 0.94 mg/dL (ref 0.40–1.50)
GFR: 84.48 mL/min (ref >60.00)
Glucose, Bld: 107 mg/dL — ABNORMAL HIGH (ref 70–99)
Potassium: 4.5 mEq/L (ref 3.5–5.1)
Sodium: 139 mEq/L (ref 135–145)
Total Bilirubin: 0.5 mg/dL (ref 0.2–1.2)
Total Protein: 7.4 g/dL (ref 6.0–8.3)

## 2018-05-28 LAB — VITAMIN B12: VITAMIN B 12: 610 pg/mL (ref 211–911)

## 2018-05-28 LAB — MAGNESIUM: MAGNESIUM: 2.2 mg/dL (ref 1.5–2.5)

## 2018-05-28 MED ORDER — CIPROFLOXACIN HCL 500 MG PO TABS
500.0000 mg | ORAL_TABLET | Freq: Two times a day (BID) | ORAL | 0 refills | Status: DC
Start: 2018-05-28 — End: 2018-07-05

## 2018-05-28 MED ORDER — OMEPRAZOLE 20 MG PO CPDR
20.0000 mg | DELAYED_RELEASE_CAPSULE | Freq: Every day | ORAL | 1 refills | Status: DC
Start: 2018-05-28 — End: 2018-08-23

## 2018-05-28 MED ORDER — METRONIDAZOLE 500 MG PO TABS: 500.0000 mg | ORAL_TABLET | Freq: Three times a day (TID) | ORAL | 0 refills | Status: DC

## 2018-05-28 NOTE — Progress Notes (Signed)
Keith Vazquez is a 67 y.o. male with the following history as recorded in EpicCare:  Patient Active Problem List   Diagnosis Date Noted  . Routine general medical examination at a health care facility 12/04/2016  . Hypokalemia 11/30/2016  . Chronic coughing 11/29/2016  . Migraine without aura and with status migrainosus, not intractable 11/29/2016  . Erectile dysfunction due to arterial insufficiency 11/29/2016  . Other hemorrhoids 02/24/2015  . BPH (benign prostatic hyperplasia) 02/24/2015  . Bilateral carpal tunnel syndrome 02/24/2015  . Atrial flutter ([place]) 03/20/2014  . Chronic systolic heart failure ([place]) 11/28/2013  . Coronary artery disease   . Dyspnea 09/30/2013  . PUD (peptic ulcer disease) 07/22/2013  . Rhinitis 07/22/2013  . Atrial fibrillation ([place]) 10/21/2011  . Hyperlipidemia with target low density lipoprotein (LDL) cholesterol less than 70 mg/dL 08/11/2010  . ISCHEMIC CARDIOMYOPATHY 08/11/2010  . Essential hypertension 08/10/2010  . OSTEOARTHRITIS, KNEE, RIGHT 01/20/2010  . GERD 04/23/2008    Current Outpatient Medications  Medication Sig Dispense Refill  . Ascorbic Acid (VITAMIN C) 100 MG tablet Take 500 mg by mouth daily.     Marland Kitchen b complex vitamins capsule Take 1 capsule by mouth daily.      . carvedilol (COREG) 12.5 MG tablet Take 1 tablet (12.5 mg total) by mouth 2 (two) times daily with a meal. 60 tablet 11  . co-enzyme Q-10 30 MG capsule Take 100 mg by mouth daily.     Marland Kitchen ENTRESTO 24-26 MG TAKE 1 TABLET BY MOUTH TWICE DAILY. 180 tablet 3  . ferrous sulfate 325 (65 FE) MG tablet Take 325 mg by mouth daily with breakfast.    . GAVILYTE-G 236 g solution     . Niacinamide-Zn-Cu-Methylfolate (NICOMIDE PO) Take 1 tablet by mouth daily.    . Omega-3 Fatty Acids (FISH OIL PO) Take 1,200 mg by mouth 2 (two) times daily.     . SUMAtriptan (IMITREX) 100 MG tablet     . vitamin E 200 UNIT capsule Take 200 Units by mouth daily.    . ciprofloxacin (CIPRO) 500 MG tablet  Take 1 tablet (500 mg total) by mouth 2 (two) times daily. 14 tablet 0  . metroNIDAZOLE (FLAGYL) 500 MG tablet Take 1 tablet (500 mg total) by mouth 3 (three) times daily. 21 tablet 0  . omeprazole (PRILOSEC) 20 MG capsule Take 1 capsule (20 mg total) by mouth daily. May take twice a day as directed 60 capsule 1   No current facility-administered medications for this visit.     Allergies: Amiodarone; Oxycodone-acetaminophen; and Propoxyphene n-acetaminophen  Past Medical History:  Diagnosis Date  . Atrial flutter ([place])   . CHF (congestive heart failure) (HCC)    LVEF at time of cath 2002 50%; now has been 35-40% since 2008 (patient has seen multiple cardiologists over the years including [facility] [place], [place] [facility], [facility] and Tollie Eth, MD)  . Coronary artery disease    CABG x5 in 2000; s/p cath 2002 > 4/5 grafts patent, LVEF 50%  . Degeneration of lumbar intervertebral disc   . GERD (gastroesophageal reflux disease)   . GI bleed    duodenal ulcer  . Hypertension    Borderline  . Ischemic heart disease    s/p MI, CABG 2000, s/p cath 2002 with 4/5 grafts patent (LIMA to LAD atretic and occluded mid vessel but adequate flow to distal LAD from collaterals / diagonal  . Low back pain   . PAC (premature atrial contraction)   .  PAF (paroxysmal atrial fibrillation) (HCC)    initially diagnosed during stress test 2002  . Peyronie's disease     Past Surgical History:  Procedure Laterality Date  . ABLATION  05-06-2014   PVI and CTI ablation by Dr Allred  . ATRIAL FIBRILLATION ABLATION N/A 05/06/2014   Procedure: ATRIAL FIBRILLATION ABLATION;  Surgeon: Leonte D Allred, MD;  Location: [facility] CATH LAB;  Service: Cardiovascular;  Laterality: N/A;  . CARDIAC CATHETERIZATION  09/14/2001    Mildly decreased left ventricular systolic function --  Native three vessel coronary artery disease as described. -- Status post coronary artery bypass grafting  . CORONARY  ARTERY BYPASS GRAFT  2000   LIMA to LAD, SVG to second diagonal, seq SVG to ramus and OM, SVG to PDA  . HERNIA REPAIR  05/16/2001   Large left indirect inguinal hernia with right direct hernia.  . HERNIA REPAIR     Recurrent left inguinal hernia -- Large left indirect inguinal hernia with right direct hernia.  . LEFT HEART CATHETERIZATION WITH CORONARY/GRAFT ANGIOGRAM  11/26/2013   Procedure: LEFT HEART CATHETERIZATION WITH CORONARY/GRAFT ANGIOGRAM;  Surgeon: Peter M Jordan, MD;  Location: [facility] CATH LAB;  Service: Cardiovascular;;  . TEE WITHOUT CARDIOVERSION N/A 05/06/2014   Procedure: TRANSESOPHAGEAL ECHOCARDIOGRAM (TEE);  Surgeon: Katarina H Nelson, MD;  Location: [facility] ENDOSCOPY;  Service: Cardiovascular;  Laterality: N/A;    Family History  Adopted: Yes  Family history unknown: Yes    Social History   Tobacco Use  . Smoking status: Former Smoker    Packs/day: 1.00    Years: 20.00    Pack years: 20.00    Types: Cigarettes    Last attempt to quit: 10/31/1994    Years since quitting: 23.5  . Smokeless tobacco: Never Used  Substance Use Topics  . Alcohol use: No    Alcohol/week: 0.0 oz    Subjective:  Patient presents with continued stomach concerns- has now seen 2 different GI providers; is concerned about underlying infection in his colon causing the symptoms and is adamantly requesting antibiotics; has recently had colonosocpy/ endoscopy and is scheduled to see his GI in follow-up in the next few weeks; patient does know that he did not have H. Pylori/ is not sure Celiac testing was done; could not tolerated Protonix- felt that he did better on the Omeprazole 20 mg daily; would like to get Rx for this;    Objective:  Vitals:   05/28/18 1126  BP: 108/70  Pulse: (!) 51  Temp: 98.2 F (36.8 C)  TempSrc: Oral  SpO2: 96%  Weight: 197 lb 0.6 oz (89.4 kg)  Height: 6' 1" (1.854 m)    General: Well developed, well nourished, in no acute distress  Skin : Warm and dry.  Head:  Normocephalic and atraumatic  Lungs: Respirations unlabored; clear to auscultation bilaterally without wheeze, rales, rhonchi  Abdomen: Soft; nontender; Neurologic: Alert and oriented; speech intact; face symmetrical; moves all extremities well; CNII-XII intact without focal deficit  Assessment:  1. Abdominal pain, unspecified abdominal location   2. Numbness     Plan:  This is not a new condition for patient; under care of GI and has follow-up scheduled there; agree to treat for underlying infection with combination of Cipro and Flagyl; discussed breath testing/ small bowel infection- that this would need to be done by GI; ? Celiac disease- labs to be updated; patient is agreeable and would be interested in seeing an allergist- follow-up to be determined based on labs; Have asked   that he get his GI records released to us for review.   No follow-ups on file.  Orders Placed This Encounter  Procedures  . CBC w/Diff    Standing Status:   Future    Number of Occurrences:   1    Standing Expiration Date:   05/28/2019  . Comp Met (CMET)    Standing Status:   Future    Number of Occurrences:   1    Standing Expiration Date:   05/28/2019  . Magnesium    Standing Status:   Future    Number of Occurrences:   1    Standing Expiration Date:   05/28/2019  . Celiac Disease Panel    Standing Status:   Future    Number of Occurrences:   1    Standing Expiration Date:   05/29/2019  . B12    Standing Status:   Future    Number of Occurrences:   1    Standing Expiration Date:   05/28/2019    Requested Prescriptions   Signed Prescriptions Disp Refills  . ciprofloxacin (CIPRO) 500 MG tablet 14 tablet 0    Sig: Take 1 tablet (500 mg total) by mouth 2 (two) times daily.  . metroNIDAZOLE (FLAGYL) 500 MG tablet 21 tablet 0    Sig: Take 1 tablet (500 mg total) by mouth 3 (three) times daily.  . omeprazole (PRILOSEC) 20 MG capsule 60 capsule 1    Sig: Take 1 capsule (20 mg total) by mouth daily. May take  twice a day as directed     

## 2018-05-31 ENCOUNTER — Encounter: Payer: Self-pay | Admitting: Family

## 2018-05-31 LAB — CELIAC DISEASE PANEL
(tTG) Ab, IgA: 1 U/mL
(tTG) Ab, IgG: 3 U/mL
Gliadin IgA: 6 Units
Gliadin IgG: 3 Units
Immunoglobulin A: 208 mg/dL (ref 20–320)

## 2018-06-03 ENCOUNTER — Encounter: Payer: Self-pay | Admitting: Family

## 2018-06-04 ENCOUNTER — Other Ambulatory Visit: Payer: Self-pay | Admitting: Family

## 2018-06-04 DIAGNOSIS — Z91018 Allergy to other foods: Secondary | ICD-10-CM

## 2018-06-05 DIAGNOSIS — M79671 Pain in right foot: Secondary | ICD-10-CM | POA: Diagnosis not present

## 2018-06-05 DIAGNOSIS — M79672 Pain in left foot: Secondary | ICD-10-CM | POA: Diagnosis not present

## 2018-06-05 DIAGNOSIS — M722 Plantar fascial fibromatosis: Secondary | ICD-10-CM | POA: Diagnosis not present

## 2018-06-05 DIAGNOSIS — Q667 Congenital pes cavus: Secondary | ICD-10-CM | POA: Diagnosis not present

## 2018-07-05 ENCOUNTER — Ambulatory Visit (INDEPENDENT_AMBULATORY_CARE_PROVIDER_SITE_OTHER): Payer: Medicare Other | Admitting: Allergy & Immunology

## 2018-07-05 ENCOUNTER — Encounter: Payer: Self-pay | Admitting: Allergy & Immunology

## 2018-07-05 VITALS — BP 112/76 | HR 92 | Temp 97.6°F | Resp 16 | Ht 72.5 in | Wt 197.4 lb

## 2018-07-05 DIAGNOSIS — J310 Chronic rhinitis: Secondary | ICD-10-CM

## 2018-07-05 DIAGNOSIS — I255 Ischemic cardiomyopathy: Secondary | ICD-10-CM | POA: Diagnosis not present

## 2018-07-05 DIAGNOSIS — J3089 Other allergic rhinitis: Secondary | ICD-10-CM

## 2018-07-05 DIAGNOSIS — T781XXD Other adverse food reactions, not elsewhere classified, subsequent encounter: Secondary | ICD-10-CM

## 2018-07-05 DIAGNOSIS — T485X5A Adverse effect of other anti-common-cold drugs, initial encounter: Secondary | ICD-10-CM

## 2018-07-05 DIAGNOSIS — T485X1A Poisoning by other anti-common-cold drugs, accidental (unintentional), initial encounter: Secondary | ICD-10-CM | POA: Diagnosis not present

## 2018-07-05 DIAGNOSIS — J302 Other seasonal allergic rhinitis: Secondary | ICD-10-CM

## 2018-07-05 MED ORDER — MONTELUKAST SODIUM 10 MG PO TABS: 10.0000 mg | ORAL_TABLET | Freq: Every day | ORAL | 5 refills | Status: DC

## 2018-07-05 MED ORDER — FLUTICASONE PROPIONATE 93 MCG/ACT NA EXHU: 2.0000 | INHALANT_SUSPENSION | Freq: Two times a day (BID) | NASAL | 5 refills | Status: DC

## 2018-07-05 NOTE — Progress Notes (Signed)
NEW PATIENT  Date of Service/Encounter:  07/05/18  Referring provider: [name], MD   Assessment:   Seasonal and perennial allergic rhinitis (trees, weeds, grasses, dust mites and cockroach)  Mixed rhinitis - with rhinitis medicamentosa, allergic rhinitis, and gustatory rhinitis  Adverse food reaction - with negative testing to the most common foods   Mr. Shevchenko is a delightful 67 year old gentleman presenting with a long-standing history of chronic rhinitis.  Testing today indicates sensitizations to trees, weeds, grasses, dust mites, and cockroach.  I anticipate that dust mites are the most likely allergen contributing to his symptoms, as he has year-round symptoms.  However, he also has a couple of other etiologies of his rhinitis, 1 of them being chronic use of a nasal decongestion spray.  This leads to a condition known as rhinitis medicamentosa which presents due to a chronic need for the antihistamine.  He also had clearly has gustatory rhinitis, which presents with runny nose during meals.  However, I am hopeful that the addition of this nasal spray will help him wean off of the nasal decongestion and provide some coverage of his marked rhinitis symptoms.   Plan/Recommendations:   1. Seasonal and perennial allergic rhinitis - Testing today showed: trees, weeds, grasses, dust mites and cockroach - Avoidance measures provided. - Start taking: Singulair (montelukast) 10mg  daily and Xhance (fluticasone) 1-2 sprays per nostril daily - You can use an extra dose of the antihistamine, if needed, for breakthrough symptoms.  - Consider nasal saline rinses 1-2 times daily to remove allergens from the nasal cavities as well as help with mucous clearance (this is especially helpful to do before the nasal sprays are given) - Consider allergy shots as a means of long-term control. - Allergy shots "re-train" and "reset" the immune system to ignore environmental allergens and decrease  the resulting immune response to those allergens (sneezing, itchy watery eyes, runny nose, nasal congestion, etc).    - Allergy shots improve symptoms in 75-85% of patients.  - We can discuss more at the next appointment if the medications are not working for you.  2. Rhinitis medicamentosa - Try to wean the Afrin as tolerated, but it will take several months since you have been on it for so long.   3. Adverse food reaction - Testing to the most common foods was negative (peanut, sesame, cashew, soy, fish mix, shellfish mix, wheat, milk, casein, egg) - This rules out 97% of all food allergies.   4. Return in about 6 months (around 01/03/2019).  Subjective:   JUVENAL UMAR is a 67 y.o. male presenting today for evaluation of  Chief Complaint  Patient presents with  . Abdominal Pain    started after a taco salad at a [employer]  . Nasal Congestion    all the time constantly for 40 yrs    VAL SCHIAVO has a history of the following: Patient Active Problem List   Diagnosis Date Noted  . Seasonal and perennial allergic rhinitis 07/05/2018  . Rhinitis medicamentosa 07/05/2018  . Routine general medical examination at a health care facility 12/04/2016  . Hypokalemia 11/30/2016  . Chronic coughing 11/29/2016  . Migraine without aura and with status migrainosus, not intractable 11/29/2016  . Erectile dysfunction due to arterial insufficiency 11/29/2016  . Other hemorrhoids 02/24/2015  . BPH (benign prostatic hyperplasia) 02/24/2015  . Bilateral carpal tunnel syndrome 02/24/2015  . Atrial flutter ([place]) 03/20/2014  . Chronic systolic heart failure ([place]) 11/28/2013  . Coronary artery  disease   . Dyspnea 09/30/2013  . PUD (peptic ulcer disease) 07/22/2013  . Rhinitis 07/22/2013  . Atrial fibrillation ([place]) 10/21/2011  . Hyperlipidemia with target low density lipoprotein (LDL) cholesterol less than 70 mg/dL 08/11/2010  . ISCHEMIC CARDIOMYOPATHY 08/11/2010  . Essential  hypertension 08/10/2010  . OSTEOARTHRITIS, KNEE, RIGHT 01/20/2010  . GERD 04/23/2008    History obtained from: chart review and patient.  [name] was referred by [name], MD.     Tannon is a 67 y.o. male presenting for an evaluation of rhinitis. He reports that he has had nasal congestion for 40+ years. His adoptive parents "forced him to come here" when he was 47 from [place]. He thinks that his symptoms developed in his late 4s or early 21s. He does not really get sinus infections, but he does get severe headaches in the front of his head. His thinks that they were sinus associated, and Sudafed and ASA would improve it. Sumitriptan does help the headache.   He is on an Afrin like medication frequently. He is on Dymista which did provide some minimal relief. He does have rhinorrhea with eating. He has never identified anything that seems to make his sinus issues worse. HE does have a clean house with little carpeting. There is a modern air filtration system as well as a humidifier used in the winter time.   Symptom ebb and flow but he has never been able to associate it with a change in season. He has never been allergy tested but he has seen an ENT doctor whom he saw once.  He did not seem impressed with the ENT physician.  He developed "terrible breathing problems" five years ago with ADLs. He went to see Dr. Annamaria Boots, who gave him the [place]. He was started on an asthma inhaler without improvement. There was no conclusion to the etiology of his symptoms. Overall it has improved by itself. He is doing more cardio in the gym but he was doing a lot of that before. He did take a beta blocker with improvement in his symptoms. He was started on carvediolol which seems to have helped the SOB.   He denies history of food allergies, but he has had some chronic colitis for the past 4 to 6 weeks.  This is been managed by his primary care provider and is overall getting better.  He did not  have any systemic symptoms including hives, wheezing, or throat swelling with these reactions.  He was on a course of antibiotics to help with this.  He does tolerate all of the major food allergens without adverse event, but he has limited exposure to a number of them including nuts as well as cows milk.  Otherwise, there is no history of other atopic diseases, including asthma, drug allergies, stinging insect allergies, eczema or urticaria. There is no significant infectious history. Vaccinations are up to date.    Past Medical History: Patient Active Problem List   Diagnosis Date Noted  . Seasonal and perennial allergic rhinitis 07/05/2018  . Rhinitis medicamentosa 07/05/2018  . Routine general medical examination at a health care facility 12/04/2016  . Hypokalemia 11/30/2016  . Chronic coughing 11/29/2016  . Migraine without aura and with status migrainosus, not intractable 11/29/2016  . Erectile dysfunction due to arterial insufficiency 11/29/2016  . Other hemorrhoids 02/24/2015  . BPH (benign prostatic hyperplasia) 02/24/2015  . Bilateral carpal tunnel syndrome 02/24/2015  . Atrial flutter ([place]) 03/20/2014  . Chronic systolic heart failure (  [place]) 11/28/2013  . Coronary artery disease   . Dyspnea 09/30/2013  . PUD (peptic ulcer disease) 07/22/2013  . Rhinitis 07/22/2013  . Atrial fibrillation ([place]) 10/21/2011  . Hyperlipidemia with target low density lipoprotein (LDL) cholesterol less than 70 mg/dL 08/11/2010  . ISCHEMIC CARDIOMYOPATHY 08/11/2010  . Essential hypertension 08/10/2010  . OSTEOARTHRITIS, KNEE, RIGHT 01/20/2010  . GERD 04/23/2008    Medication List:  Allergies as of 07/05/2018      Reactions   Amiodarone Other (See Comments)   Left permanent scar on eye   Oxycodone-acetaminophen Nausea Only   Propoxyphene N-acetaminophen Nausea Only      Medication List        Accurate as of 07/05/18  5:01 PM. Always use your most recent med list.          b complex  vitamins capsule Take 1 capsule by mouth daily.   carvedilol 12.5 MG tablet Commonly known as:  COREG Take 1 tablet (12.5 mg total) by mouth 2 (two) times daily with a meal.   co-enzyme Q-10 30 MG capsule Take 100 mg by mouth daily.   ENTRESTO 24-26 MG Generic drug:  sacubitril-valsartan TAKE 1 TABLET BY MOUTH TWICE DAILY.   ferrous sulfate 325 (65 FE) MG tablet Take 325 mg by mouth daily with breakfast.   FISH OIL PO Take 1,200 mg by mouth 2 (two) times daily.   Fluticasone Propionate 93 MCG/ACT Exhu Place 2 sprays into the nose 2 (two) times daily.   montelukast 10 MG tablet Commonly known as:  SINGULAIR Take 1 tablet (10 mg total) by mouth at bedtime.   omeprazole 20 MG capsule Commonly known as:  PRILOSEC Take 1 capsule (20 mg total) by mouth daily. May take twice a day as directed   SUMAtriptan 100 MG tablet Commonly known as:  IMITREX   vitamin C 100 MG tablet Take 500 mg by mouth daily.   vitamin E 200 UNIT capsule Take 200 Units by mouth daily.       Birth History: non-contributory  Developmental History: non-contributory.   Past Surgical History: Past Surgical History:  Procedure Laterality Date  . ABLATION  05-06-2014   PVI and CTI ablation by Dr [name]  . ATRIAL FIBRILLATION ABLATION N/A 05/06/2014   Procedure: ATRIAL FIBRILLATION ABLATION;  Surgeon: [name], MD;  Location: [facility] CATH [facility];  Service: Cardiovascular;  Laterality: N/A;  . CARDIAC CATHETERIZATION  09/14/2001    Mildly decreased left ventricular systolic function --  Native three vessel coronary artery disease as described. -- Status post coronary artery bypass grafting  . CORONARY ARTERY BYPASS GRAFT  2000   LIMA to LAD, SVG to second diagonal, seq SVG to ramus and OM, SVG to PDA  . HERNIA REPAIR  05/16/2001   Large left indirect inguinal hernia with right direct hernia.  Marland Kitchen HERNIA REPAIR     Recurrent left inguinal hernia -- Large left indirect inguinal hernia with right direct  hernia.  Marland Kitchen LEFT HEART CATHETERIZATION WITH CORONARY/GRAFT ANGIOGRAM  11/26/2013   Procedure: LEFT HEART CATHETERIZATION WITH [name];  Surgeon: Peter M Martinique, MD;  Location: [facility] CATH LAB;  Service: Cardiovascular;;  . TEE WITHOUT CARDIOVERSION N/A 05/06/2014   Procedure: TRANSESOPHAGEAL ECHOCARDIOGRAM (TEE);  Surgeon: [name], MD;  Location: [facility] ENDOSCOPY;  Service: Cardiovascular;  Laterality: N/A;     Family History: Family History  Adopted: Yes     Social History: Jedaiah lives in a house that is 69 years old.  There is carpeting  in the main living areas and would in the bedrooms.  He has gas heating and central cooling.  There are no animals inside or outside of the home.  There are no dust mite covers on the bedding.  There is no tobacco exposure.  He currently works as an Financial controller of a Aeronautical engineer, which focuses on vintage cars.  He also works as a Insurance underwriter for Engineer, structural of the [employer]    Review of Systems: a 14-point review of systems is pertinent for what is mentioned in HPI.  Otherwise, all other systems were negative. Constitutional: negative other than that listed in the HPI Eyes: negative other than that listed in the HPI Ears, nose, mouth, throat, and face: negative other than that listed in the HPI Respiratory: negative other than that listed in the HPI Cardiovascular: negative other than that listed in the HPI Gastrointestinal: negative other than that listed in the HPI Genitourinary: negative other than that listed in the HPI Integument: negative other than that listed in the HPI Hematologic: negative other than that listed in the HPI Musculoskeletal: negative other than that listed in the HPI Neurological: negative other than that listed in the HPI Allergy/Immunologic: negative other than that listed in the HPI    Objective:   Blood pressure 112/76, pulse 92, temperature 97.6 F (36.4 C), temperature source Oral, resp. rate 16,  height 6' 0.5" (1.842 m), weight 197 lb 6.4 oz (89.5 kg). Body mass index is 26.4 kg/m.   Physical Exam:  General: Alert, interactive, in no acute distress.  Pleasant talkative male. Eyes: No conjunctival injection bilaterally, no discharge on the right, no discharge on the left and no Horner-Trantas dots present. PERRL bilaterally. EOMI without pain. No photophobia.  Ears: Right TM pearly gray with normal light reflex, Left TM pearly gray with normal light reflex, Right TM intact without perforation and Left TM intact without perforation.  Nose/Throat: External nose within normal limits and septum midline. Turbinates edematous with clear discharge. Posterior oropharynx erythematous with cobblestoning in the posterior oropharynx. Tonsils 2+ without exudates.  Tongue without thrush. Neck: Supple without thyromegaly. Trachea midline. Adenopathy: no enlarged lymph nodes appreciated in the anterior cervical, occipital, axillary, epitrochlear, inguinal, or popliteal regions. Lungs: Clear to auscultation without wheezing, rhonchi or rales. No increased work of breathing. CV: Normal S1/S2. No murmurs. Capillary refill <2 seconds.  Abdomen: Nondistended, nontender. No guarding or rebound tenderness. Bowel sounds faint and present in all fields  Skin: Warm and dry, without lesions or rashes. Extremities:  No clubbing, cyanosis or edema. Neuro:   Grossly intact. No focal deficits appreciated. Responsive to questions.  Diagnostic studies:   Allergy Studies:   Indoor/Outdoor Percutaneous Adult Environmental Panel: positive to Df mite, Dp mites and cockroach. Otherwise negative with adequate controls.  Indoor/Outdoor Selected Intradermal Environmental Panel: positive to [employer], weed mix and tree mix. Otherwise negative with adequate controls.  Most Common Foods Panel (peanut, cashew, soy, fish mix, shellfish mix, wheat, milk, egg): Negative to all with adequate controls.   Allergy testing  results were read and interpreted by myself, documented by clinical staff.       Salvatore Marvel, MD [facility] [place]

## 2018-07-05 NOTE — Patient Instructions (Addendum)
1. Seasonal and perennial allergic rhinitis - Testing today showed: trees, weeds, grasses, dust mites and cockroach - Avoidance measures provided. - Start taking: Singulair (montelukast) 10mg  daily and Xhance (fluticasone) 1-2 sprays per nostril daily - You can use an extra dose of the antihistamine, if needed, for breakthrough symptoms.  - Consider nasal saline rinses 1-2 times daily to remove allergens from the nasal cavities as well as help with mucous clearance (this is especially helpful to do before the nasal sprays are given) - Consider allergy shots as a means of long-term control. - Allergy shots "re-train" and "reset" the immune system to ignore environmental allergens and decrease the resulting immune response to those allergens (sneezing, itchy watery eyes, runny nose, nasal congestion, etc).    - Allergy shots improve symptoms in 75-85% of patients.  - We can discuss more at the next appointment if the medications are not working for you.  2. Rhinitis medicamentosa - Try to wean the Afrin as tolerated, but it will take several months since you have been on it for so long.   3. Adverse food reaction - Testing to the most common foods was negative (peanut, sesame, cashew, soy, fish mix, shellfish mix, wheat, milk, casein, egg) - This rules out 97% of all food allergies.   4. Return in about 6 months (around 01/03/2019).   Please inform us of any Emergency Department visits, hospitalizations, or changes in symptoms. Call us before going to the ED for breathing or allergy symptoms since we might be able to fit you in for a sick visit. Feel free to contact us anytime with any questions, problems, or concerns.  It was a pleasure to meet you today!  Websites that have reliable patient information: 1. American Academy of Asthma, Allergy, and Immunology: www.aaaai.org 2. Food Allergy Research and Education (FARE): foodallergy.org 3. Mothers of Asthmatics:  http://www.asthmacommunitynetwork.org 4. American College of Allergy, Asthma, and Immunology: MonthlyElectricBill.co.uk   Make sure you are registered to vote! If you have moved or changed any of your contact information, you will need to get this updated before voting!    Reducing Pollen Exposure  The American Academy of Allergy, Asthma and Immunology suggests the following steps to reduce your exposure to pollen during allergy seasons.    1. Do not hang sheets or clothing out to dry; pollen may collect on these items. 2. Do not mow lawns or spend time around freshly cut grass; mowing stirs up pollen. 3. Keep windows closed at night.  Keep car windows closed while driving. 4. Minimize morning activities outdoors, a time when pollen counts are usually at their highest. 5. Stay indoors as much as possible when pollen counts or humidity is high and on windy days when pollen tends to remain in the air longer. 6. Use air conditioning when possible.  Many air conditioners have filters that trap the pollen spores. 7. Use a HEPA room air filter to remove pollen form the indoor air you breathe.  Control of House Dust Mite Allergen    House dust mites play a major role in allergic asthma and rhinitis.  They occur in environments with high humidity wherever human skin, the food for dust mites is found. High levels have been detected in dust obtained from mattresses, pillows, carpets, upholstered furniture, bed covers, clothes and soft toys.  The principal allergen of the house dust mite is found in its feces.  A gram of dust may contain 1,000 mites and 250,000 fecal particles.  Mite antigen  is easily measured in the air during house cleaning activities.    1. Encase mattresses, including the box spring, and pillow, in an air tight cover.  Seal the zipper end of the encased mattresses with wide adhesive tape. 2. Wash the bedding in water of 130 degrees Farenheit weekly.  Avoid cotton comforters/quilts and  flannel bedding: the most ideal bed covering is the dacron comforter. 3. Remove all upholstered furniture from the bedroom. 4. Remove carpets, carpet padding, rugs, and non-washable window drapes from the bedroom.  Wash drapes weekly or use plastic window coverings. 5. Remove all non-washable stuffed toys from the bedroom.  Wash stuffed toys weekly. 6. Have the room cleaned frequently with a vacuum cleaner and a damp dust-mop.  The patient should not be in a room which is being cleaned and should wait 1 hour after cleaning before going into the room. 7. Close and seal all heating outlets in the bedroom.  Otherwise, the room will become filled with dust-laden air.  An electric heater can be used to heat the room. 8. Reduce indoor humidity to less than 50%.  Do not use a humidifier.  Control of Cockroach Allergen  Cockroach allergen has been identified as an important cause of acute attacks of asthma, especially in urban settings.  There are fifty-five species of cockroach that exist in the [place], however only three, the [place], [place] species produce allergen that can affect patients with Asthma.  Allergens can be obtained from fecal particles, egg casings and secretions from cockroaches.    1. Remove food sources. 2. Reduce access to water. 3. Seal access and entry points. 4. Spray runways with 0.5-1% Diazinon or Chlorpyrifos 5. Blow boric acid power under stoves and refrigerator. 6. Place bait stations (hydramethylnon) at feeding sites.  Allergy Shots   Allergies are the result of a chain reaction that starts in the immune system. Your immune system controls how your body defends itself. For instance, if you have an allergy to pollen, your immune system identifies pollen as an invader or allergen. Your immune system overreacts by producing antibodies called Immunoglobulin E (IgE). These antibodies travel to cells that release chemicals, causing an allergic  reaction.  The concept behind allergy immunotherapy, whether it is received in the form of shots or tablets, is that the immune system can be desensitized to specific allergens that trigger allergy symptoms. Although it requires time and patience, the payback can be long-term relief.  How Do Allergy Shots Work?  Allergy shots work much like a vaccine. Your body responds to injected amounts of a particular allergen given in increasing doses, eventually developing a resistance and tolerance to it. Allergy shots can lead to decreased, minimal or no allergy symptoms.  There generally are two phases: build-up and maintenance. Build-up often ranges from three to six months and involves receiving injections with increasing amounts of the allergens. The shots are typically given once or twice a week, though more rapid build-up schedules are sometimes used.  The maintenance phase begins when the most effective dose is reached. This dose is different for each person, depending on how allergic you are and your response to the build-up injections. Once the maintenance dose is reached, there are longer periods between injections, typically two to four weeks.  Occasionally doctors give cortisone-type shots that can temporarily reduce allergy symptoms. These types of shots are different and should not be confused with allergy immunotherapy shots.  Who Can Be Treated with Allergy Shots?  Allergy  shots may be a good treatment approach for people with allergic rhinitis (hay fever), allergic asthma, conjunctivitis (eye allergy) or stinging insect allergy.   Before deciding to begin allergy shots, you should consider:  . The length of allergy season and the severity of your symptoms . Whether medications and/or changes to your environment can control your symptoms . Your desire to avoid long-term medication use . Time: allergy immunotherapy requires a major time commitment . Cost: may vary depending on your  insurance coverage  Allergy shots for children age 58 and older are effective and often well tolerated. They might prevent the onset of new allergen sensitivities or the progression to asthma.  Allergy shots are not started on patients who are pregnant but can be continued on patients who become pregnant while receiving them. In some patients with other medical conditions or who take certain common medications, allergy shots may be of risk. It is important to mention other medications you talk to your allergist.   When Will I Feel Better?  Some may experience decreased allergy symptoms during the build-up phase. For others, it may take as long as 12 months on the maintenance dose. If there is no improvement after a year of maintenance, your allergist will discuss other treatment options with you.  If you aren't responding to allergy shots, it may be because there is not enough dose of the allergen in your vaccine or there are missing allergens that were not identified during your allergy testing. Other reasons could be that there are high levels of the allergen in your environment or major exposure to non-allergic triggers like tobacco smoke.  What Is the Length of Treatment?  Once the maintenance dose is reached, allergy shots are generally continued for three to five years. The decision to stop should be discussed with your allergist at that time. Some people may experience a permanent reduction of allergy symptoms. Others may relapse and a longer course of allergy shots can be considered.  What Are the Possible Reactions?  The two types of adverse reactions that can occur with allergy shots are local and systemic. Common local reactions include very mild redness and swelling at the injection site, which can happen immediately or several hours after. A systemic reaction, which is less common, affects the entire body or a particular body system. They are usually mild and typically respond  quickly to medications. Signs include increased allergy symptoms such as sneezing, a stuffy nose or hives.  Rarely, a serious systemic reaction called anaphylaxis can develop. Symptoms include swelling in the throat, wheezing, a feeling of tightness in the chest, nausea or dizziness. Most serious systemic reactions develop within 30 minutes of allergy shots. This is why it is strongly recommended you wait in your doctor's office for 30 minutes after your injections. Your allergist is trained to watch for reactions, and his or her staff is trained and equipped with the proper medications to identify and treat them.  Who Should Administer Allergy Shots?  The preferred location for receiving shots is your prescribing allergist's office. Injections can sometimes be given at another facility where the physician and staff are trained to recognize and treat reactions, and have received instructions by your prescribing allergist.

## 2018-08-06 DIAGNOSIS — Z79891 Long term (current) use of opiate analgesic: Secondary | ICD-10-CM | POA: Diagnosis not present

## 2018-08-06 DIAGNOSIS — G894 Chronic pain syndrome: Secondary | ICD-10-CM | POA: Diagnosis not present

## 2018-08-06 DIAGNOSIS — M47816 Spondylosis without myelopathy or radiculopathy, lumbar region: Secondary | ICD-10-CM | POA: Diagnosis not present

## 2018-08-08 DIAGNOSIS — M79671 Pain in right foot: Secondary | ICD-10-CM | POA: Diagnosis not present

## 2018-08-08 DIAGNOSIS — M722 Plantar fascial fibromatosis: Secondary | ICD-10-CM | POA: Diagnosis not present

## 2018-08-08 DIAGNOSIS — M79672 Pain in left foot: Secondary | ICD-10-CM | POA: Diagnosis not present

## 2018-08-23 ENCOUNTER — Other Ambulatory Visit: Payer: Self-pay

## 2018-08-23 MED ORDER — PANTOPRAZOLE SODIUM 20 MG PO TBEC: 20.0000 mg | DELAYED_RELEASE_TABLET | Freq: Every day | ORAL | 1 refills | Status: DC

## 2018-08-23 MED ORDER — OMEPRAZOLE 10 MG PO CPDR: 10.0000 mg | DELAYED_RELEASE_CAPSULE | Freq: Every day | ORAL | 1 refills | Status: DC

## 2018-08-23 MED ORDER — OMEPRAZOLE 20 MG PO CPDR: 20.0000 mg | DELAYED_RELEASE_CAPSULE | Freq: Every day | ORAL | 1 refills | Status: DC

## 2018-08-23 NOTE — Telephone Encounter (Signed)
Patient is requesting the Pantoprazole 20mg  instead of the 40mg . They used to be on this according to the med history list. I called and left message for [facility] to cancel the omeprazole 20 mg and give him the pantoprazole as requested. They resent a fax saying he's requesting the omeprazole 10mg  so we have sent that in.

## 2018-09-05 ENCOUNTER — Telehealth: Payer: Self-pay | Admitting: Allergy

## 2018-09-05 NOTE — Telephone Encounter (Signed)
Talked with patient about the [place]. Patient said he tried it twiced and it made him sick and he was not going  To use it.

## 2018-09-10 ENCOUNTER — Ambulatory Visit (INDEPENDENT_AMBULATORY_CARE_PROVIDER_SITE_OTHER): Payer: Medicare Other

## 2018-09-10 DIAGNOSIS — Z23 Encounter for immunization: Secondary | ICD-10-CM | POA: Diagnosis not present

## 2018-09-10 DIAGNOSIS — Z299 Encounter for prophylactic measures, unspecified: Secondary | ICD-10-CM

## 2019-01-07 DIAGNOSIS — G894 Chronic pain syndrome: Secondary | ICD-10-CM | POA: Diagnosis not present

## 2019-01-07 DIAGNOSIS — Z79891 Long term (current) use of opiate analgesic: Secondary | ICD-10-CM | POA: Diagnosis not present

## 2019-01-07 DIAGNOSIS — M47816 Spondylosis without myelopathy or radiculopathy, lumbar region: Secondary | ICD-10-CM | POA: Diagnosis not present

## 2019-01-09 ENCOUNTER — Other Ambulatory Visit: Payer: Self-pay

## 2019-01-09 ENCOUNTER — Other Ambulatory Visit (INDEPENDENT_AMBULATORY_CARE_PROVIDER_SITE_OTHER): Payer: Medicare Other

## 2019-01-09 ENCOUNTER — Encounter: Payer: Self-pay | Admitting: Family

## 2019-01-09 ENCOUNTER — Ambulatory Visit (INDEPENDENT_AMBULATORY_CARE_PROVIDER_SITE_OTHER): Payer: Medicare Other | Admitting: Family

## 2019-01-09 VITALS — BP 116/76 | HR 72 | Temp 97.9°F | Ht 72.5 in | Wt 202.0 lb

## 2019-01-09 DIAGNOSIS — I10 Essential (primary) hypertension: Secondary | ICD-10-CM | POA: Diagnosis not present

## 2019-01-09 DIAGNOSIS — Z13220 Encounter for screening for lipoid disorders: Secondary | ICD-10-CM | POA: Diagnosis not present

## 2019-01-09 DIAGNOSIS — R5383 Other fatigue: Secondary | ICD-10-CM

## 2019-01-09 DIAGNOSIS — R220 Localized swelling, mass and lump, head: Secondary | ICD-10-CM

## 2019-01-09 DIAGNOSIS — I5022 Chronic systolic (congestive) heart failure: Secondary | ICD-10-CM

## 2019-01-09 LAB — COMPREHENSIVE METABOLIC PANEL
ALT: 6 U/L (ref 0–53)
AST: 19 U/L (ref 0–37)
Albumin: 4.3 g/dL (ref 3.5–5.2)
Alkaline Phosphatase: 73 U/L (ref 39–117)
BILIRUBIN TOTAL: 0.4 mg/dL (ref 0.2–1.2)
BUN: 19 mg/dL (ref 6–23)
CALCIUM: 9.4 mg/dL (ref 8.4–10.5)
CO2: 28 meq/L (ref 19–32)
Chloride: 105 mEq/L (ref 96–112)
Creatinine, Ser: 0.96 mg/dL (ref 0.40–1.50)
GFR: 77.44 mL/min (ref >60.00)
GLUCOSE: 76 mg/dL (ref 70–99)
POTASSIUM: 4.3 meq/L (ref 3.5–5.1)
Sodium: 139 mEq/L (ref 135–145)
Total Protein: 7 g/dL (ref 6.0–8.3)

## 2019-01-09 LAB — LIPID PANEL
CHOL/HDL RATIO: 4
Cholesterol: 166 mg/dL (ref 0–200)
HDL: 44 mg/dL (ref >39.00)
LDL CALC: 88 mg/dL (ref 0–99)
NONHDL: 121.71
TRIGLYCERIDES: 167 mg/dL — AB (ref 0.0–149.0)
VLDL: 33.4 mg/dL (ref 0.0–40.0)

## 2019-01-09 LAB — CBC WITH DIFFERENTIAL/PLATELET
BASOS ABS: 0.1 10*3/uL (ref 0.0–0.1)
Basophils Relative: 0.8 % (ref 0.0–3.0)
EOS PCT: 3.2 % (ref 0.0–5.0)
Eosinophils Absolute: 0.2 10*3/uL (ref 0.0–0.7)
HEMATOCRIT: 45.4 % (ref 39.0–52.0)
Hemoglobin: 15.5 g/dL (ref 13.0–17.0)
LYMPHS ABS: 1.4 10*3/uL (ref 0.7–4.0)
LYMPHS PCT: 21.5 % (ref 12.0–46.0)
MCHC: 34.1 g/dL (ref 30.0–36.0)
MCV: 91.1 fl (ref 78.0–100.0)
MONOS PCT: 6.8 % (ref 3.0–12.0)
Monocytes Absolute: 0.4 10*3/uL (ref 0.1–1.0)
NEUTROS ABS: 4.4 10*3/uL (ref 1.4–7.7)
NEUTROS PCT: 67.7 % (ref 43.0–77.0)
PLATELETS: 162 10*3/uL (ref 150.0–400.0)
RBC: 4.99 Mil/uL (ref 4.22–5.81)
RDW: 13.7 % (ref 11.5–15.5)
WBC: 6.5 10*3/uL (ref 4.0–10.5)

## 2019-01-09 MED ORDER — CARVEDILOL 12.5 MG PO TABS: 12.5000 mg | ORAL_TABLET | Freq: Two times a day (BID) | ORAL | 3 refills | Status: DC

## 2019-01-09 MED ORDER — SACUBITRIL-VALSARTAN 24-26 MG PO TABS: 1.0000 | ORAL_TABLET | Freq: Two times a day (BID) | ORAL | 3 refills | Status: DC

## 2019-01-09 MED ORDER — AMOXICILLIN-POT CLAVULANATE 875-125 MG PO TABS: 1.0000 | ORAL_TABLET | Freq: Two times a day (BID) | ORAL | 0 refills | Status: DC

## 2019-01-09 MED ORDER — PREDNISONE 20 MG PO TABS: 40.0000 mg | ORAL_TABLET | Freq: Every day | ORAL | 0 refills | Status: DC

## 2019-01-09 NOTE — Progress Notes (Signed)
Keith Vazquez is a 68 y.o. male with the following history as recorded in EpicCare:  Patient Active Problem List   Diagnosis Date Noted  . Seasonal and perennial allergic rhinitis 07/05/2018  . Rhinitis medicamentosa 07/05/2018  . Routine general medical examination at a health care facility 12/04/2016  . Hypokalemia 11/30/2016  . Chronic coughing 11/29/2016  . Migraine without aura and with status migrainosus, not intractable 11/29/2016  . Erectile dysfunction due to arterial insufficiency 11/29/2016  . Other hemorrhoids 02/24/2015  . BPH (benign prostatic hyperplasia) 02/24/2015  . Bilateral carpal tunnel syndrome 02/24/2015  . Atrial flutter ([place]) 03/20/2014  . Chronic systolic heart failure ([place]) 11/28/2013  . Coronary artery disease   . Dyspnea 09/30/2013  . PUD (peptic ulcer disease) 07/22/2013  . Rhinitis 07/22/2013  . Atrial fibrillation ([place]) 10/21/2011  . Hyperlipidemia with target low density lipoprotein (LDL) cholesterol less than 70 mg/dL 08/11/2010  . ISCHEMIC CARDIOMYOPATHY 08/11/2010  . Essential hypertension 08/10/2010  . OSTEOARTHRITIS, KNEE, RIGHT 01/20/2010  . GERD 04/23/2008    Current Outpatient Medications  Medication Sig Dispense Refill  . Ascorbic Acid (VITAMIN C) 100 MG tablet Take 500 mg by mouth daily.     Marland Kitchen b complex vitamins capsule Take 1 capsule by mouth daily.      . carvedilol (COREG) 12.5 MG tablet Take 1 tablet (12.5 mg total) by mouth 2 (two) times daily with a meal. 90 tablet 3  . celecoxib (CELEBREX) 200 MG capsule     . co-enzyme Q-10 30 MG capsule Take 100 mg by mouth daily.     . ferrous sulfate 325 (65 FE) MG tablet Take 325 mg by mouth daily with breakfast.    . HYDROcodone-acetaminophen (NORCO) 7.5-325 MG tablet     . montelukast (SINGULAIR) 10 MG tablet Take 1 tablet (10 mg total) by mouth at bedtime. 30 tablet 5  . Omega-3 Fatty Acids (FISH OIL PO) Take 1,200 mg by mouth 2 (two) times daily.     . sacubitril-valsartan (ENTRESTO)  24-26 MG Take 1 tablet by mouth 2 (two) times daily. 180 tablet 3  . SUMAtriptan (IMITREX) 100 MG tablet     . vitamin E 200 UNIT capsule Take 200 Units by mouth daily.    Marland Kitchen amoxicillin-clavulanate (AUGMENTIN) 875-125 MG tablet Take 1 tablet by mouth 2 (two) times daily. 20 tablet 0  . omeprazole (PRILOSEC) 10 MG capsule Take 1 capsule (10 mg total) by mouth daily. (Patient not taking: Reported on 01/09/2019) 30 capsule 1  . predniSONE (DELTASONE) 20 MG tablet Take 2 tablets (40 mg total) by mouth daily with breakfast. 10 tablet 0   No current facility-administered medications for this visit.     Allergies: Amiodarone; Oxycodone-acetaminophen; and Propoxyphene n-acetaminophen  Past Medical History:  Diagnosis Date  . Atrial flutter ([place])   . CHF (congestive heart failure) (HCC)    LVEF at time of cath 2002 50%; now has been 35-40% since 2008 (patient has seen multiple cardiologists over the years including [facility], [place], [facility], [facility] and Tollie Eth, MD)  . Coronary artery disease    CABG x5 in 2000; s/p cath 2002 > 4/5 grafts patent, LVEF 50%  . Degeneration of lumbar intervertebral disc   . GERD (gastroesophageal reflux disease)   . GI bleed    duodenal ulcer  . Hypertension    Borderline  . Ischemic heart disease    s/p MI, CABG 2000, s/p cath 2002 with 4/5 grafts patent (LIMA to LAD  atretic and occluded mid vessel but adequate flow to distal LAD from collaterals / diagonal  . Low back pain   . PAC (premature atrial contraction)   . PAF (paroxysmal atrial fibrillation) ([place])    initially diagnosed during stress test 2002  . Peyronie's disease     Past Surgical History:  Procedure Laterality Date  . ABLATION  05-06-2014   PVI and CTI ablation by Dr [name]  . ATRIAL FIBRILLATION ABLATION N/A 05/06/2014   Procedure: ATRIAL FIBRILLATION ABLATION;  Surgeon: [name], MD;  Location: [place] CATH LAB;  Service: Cardiovascular;  Laterality:  N/A;  . CARDIAC CATHETERIZATION  09/14/2001    Mildly decreased left ventricular systolic function --  Native three vessel coronary artery disease as described. -- Status post coronary artery bypass grafting  . CORONARY ARTERY BYPASS GRAFT  2000   LIMA to LAD, SVG to second diagonal, seq SVG to ramus and OM, SVG to PDA  . HERNIA REPAIR  05/16/2001   Large left indirect inguinal hernia with right direct hernia.  Marland Kitchen HERNIA REPAIR     Recurrent left inguinal hernia -- Large left indirect inguinal hernia with right direct hernia.  Marland Kitchen LEFT HEART CATHETERIZATION WITH CORONARY/GRAFT ANGIOGRAM  11/26/2013   Procedure: LEFT HEART CATHETERIZATION WITH [name];  Surgeon: Peter M Martinique, MD;  Location: [facility] C[facility] LAB;  Service: Cardiovascular;;  . TEE WITHOUT CARDIOVERSION N/A 05/06/2014   Procedure: TRANSESOPHAGEAL ECHOCARDIOGRAM (TEE);  Surgeon: [name], MD;  Location: [facility] ENDOSCOPY;  Service: Cardiovascular;  Laterality: N/A;    Family History  Adopted: Yes    Social History   Tobacco Use  . Smoking status: Former Smoker    Packs/day: 1.00    Years: 20.00    Pack years: 20.00    Types: Cigarettes    Last attempt to quit: 10/31/1994    Years since quitting: 24.2  . Smokeless tobacco: Never Used  Substance Use Topics  . Alcohol use: No    Alcohol/week: 0.0 standard drinks    Subjective:  Patient presents with concerns for possible sinus infection; symptoms x 6 weeks; + nasal pressure, headache; low energy; concerned that has chronic sinus issues underlying- notes that his sinuses "always feel swollen." Has had evaluation with asthma/ allergy with limited benefit;  Requesting refills on his Entrestro and Coreg; notes that he was only seeing cardiology in order to get his [place] physical done; will not be needing that physical this year; does not want to go back to cardiology.     Objective:  Vitals:   01/09/19 0923  BP: 116/76  Pulse: 72  Temp: 97.9 F (36.6 C)   TempSrc: Oral  SpO2: 99%  Weight: 202 lb (91.6 kg)  Height: 6' 0.5" (1.842 m)    General: Well developed, well nourished, in no acute distress  Skin : Warm and dry.  Head: Normocephalic and atraumatic  Eyes: Sclera and conjunctiva clear; pupils round and reactive to light; extraocular movements intact  Ears: External normal; canals clear; tympanic membranes normal  Oropharynx: Pink, supple. No suspicious lesions  Neck: Supple without thyromegaly, adenopathy  Lungs: Respirations unlabored; clear to auscultation bilaterally without wheeze, rales, rhonchi  CVS exam: normal rate and regular rhythm.  Neurologic: Alert and oriented; speech intact; face symmetrical; moves all extremities well; CNII-XII intact without focal deficit   Assessment:  1. Other fatigue   2. Localized swelling, mass, and lump of head   3. Lipid screening   4. Chronic systolic heart failure ([place])  5. Essential hypertension     Plan:  Will treat for possible acute sinus infection with combination of Augmentin 875 mg bid x 10 days and Prednisone 40 mg qd x 5 days; will update sinus CT as well- may need to refer back to ENT. Patient does not want to go back to cardiology- asks that his Entresto and Coreg be refilled by our office; agrees to updating labs today including CBC, CMP and lipid panel.  No follow-ups on file.  Orders Placed This Encounter  Procedures  . CT Maxillofacial WO CM    Standing Status:   Future    Standing Expiration Date:   04/10/2020    Order Specific Question:   Preferred imaging location?    Answer:   [id number]    Order Specific Question:   Radiology Contrast Protocol - do NOT remove file path    Answer:   [email or web address]  . CBC w/Diff    Standing Status:   Future    Number of Occurrences:   1    Standing Expiration Date:   01/09/2020  . Comp Met (CMET)    Standing Status:   Future    Number of Occurrences:   1    Standing Expiration Date:    01/09/2020  . Lipid panel    Standing Status:   Future    Number of Occurrences:   1    Standing Expiration Date:   01/09/2020    Requested Prescriptions   Signed Prescriptions Disp Refills  . sacubitril-valsartan (ENTRESTO) 24-26 MG 180 tablet 3    Sig: Take 1 tablet by mouth 2 (two) times daily.  . carvedilol (COREG) 12.5 MG tablet 90 tablet 3    Sig: Take 1 tablet (12.5 mg total) by mouth 2 (two) times daily with a meal.  . amoxicillin-clavulanate (AUGMENTIN) 875-125 MG tablet 20 tablet 0    Sig: Take 1 tablet by mouth 2 (two) times daily.  . predniSONE (DELTASONE) 20 MG tablet 10 tablet 0    Sig: Take 2 tablets (40 mg total) by mouth daily with breakfast.

## 2019-01-21 ENCOUNTER — Other Ambulatory Visit: Payer: Self-pay | Admitting: Family

## 2019-01-21 DIAGNOSIS — J329 Chronic sinusitis, unspecified: Secondary | ICD-10-CM

## 2019-01-23 ENCOUNTER — Other Ambulatory Visit: Payer: Medicare Other

## 2019-02-12 ENCOUNTER — Telehealth: Payer: Self-pay | Admitting: *Deleted

## 2019-02-12 NOTE — Telephone Encounter (Signed)
Called patient to schedule AWV, patient declined.  

## 2019-02-14 ENCOUNTER — Telehealth: Payer: Self-pay | Admitting: Cardiology

## 2019-02-14 NOTE — Telephone Encounter (Signed)
LVM to schedule. 02-14-19

## 2019-02-18 ENCOUNTER — Telehealth: Payer: Self-pay

## 2019-02-18 DIAGNOSIS — I2510 Atherosclerotic heart disease of native coronary artery without angina pectoris: Secondary | ICD-10-CM

## 2019-02-18 DIAGNOSIS — I4892 Unspecified atrial flutter: Secondary | ICD-10-CM

## 2019-02-18 DIAGNOSIS — I480 Paroxysmal atrial fibrillation: Secondary | ICD-10-CM

## 2019-02-18 DIAGNOSIS — I5022 Chronic systolic (congestive) heart failure: Secondary | ICD-10-CM

## 2019-02-18 NOTE — Telephone Encounter (Signed)
Spoke to patient he stated he needs stress myoview scheduled as soon as we are allowed to start scheduling again.He also will need a follow up scheduled with Rosaria Ferries PA.Advised scheduler will call back to schedule.

## 2019-02-28 NOTE — Progress Notes (Deleted)
{Choose 1 Note Type (Telehealth Visit or Telephone Visit):[phone]}   Evaluation Performed:  Follow-up visit  Date:  02/28/2019   ID:  Keith Vazquez, DOB [birth year], MRN [id number]  {Patient [phone]::"Home"} {Provider [phone]}  PCP:  Keith Vazquez  Cardiologist:  Keith Vazquez Electrophysiologist:  None   Chief Complaint:  Follow up CAD  History of Present Illness:    Keith Vazquez is a 68 y.o. male seen for follow up CAD. He is followed by Dr. [name] for atrial fibrillation. He is post afib ablation 05/05/14. He has had no afib post ablation. He has an ischemic CM with chronic systolic CHF.  EF 40-45% by last Echo in June 2017. He is s/p CABG in 2000. Cardiac caths in 2002 and 2015 showed atretic LIMA to the LAD which filled by the SVG to the second diagonal. The SVG to the second diagonal, SVG sequentially to the ramus and OM, and the SVG to PDA were all patent. Last Myoview study in April 2016 was felt to be low risk. He had an ETT in June 2017 with no ischemia at 13 METs. He also has a history of HTN.  In January of 2018 he was started on [place]. 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 Echo showed stable EF 40-45%. Myoview showed scar but no ischemia.   The patient {does/does not:200015} have symptoms concerning for COVID-19 infection (fever, chills, cough, or new shortness of breath).    Past Medical History:  Diagnosis Date  . Atrial flutter ([place])   . CHF (congestive heart failure) (HCC)    LVEF at time of cath 2002 50%; now has been 35-40% since 2008 (patient has seen multiple cardiologists over the years including [facility], [place], [facility], [facility] and Tollie Eth, Vazquez)  . Coronary artery disease    CABG x5 in 2000; s/p cath 2002 > 4/5 grafts patent, LVEF 50%  . Degeneration of lumbar intervertebral disc   . GERD (gastroesophageal reflux disease)   . GI  bleed    duodenal ulcer  . Hypertension    Borderline  . Ischemic heart disease    s/p MI, CABG 2000, s/p cath 2002 with 4/5 grafts patent (LIMA to LAD atretic and occluded mid vessel but adequate flow to distal LAD from collaterals / diagonal  . Low back pain   . PAC (premature atrial contraction)   . PAF (paroxysmal atrial fibrillation) ([place])    initially diagnosed during stress test 2002  . Peyronie's disease    Past Surgical History:  Procedure Laterality Date  . ABLATION  05-06-2014   PVI and CTI ablation by Dr [name]  . ATRIAL FIBRILLATION ABLATION N/A 05/06/2014   Procedure: ATRIAL FIBRILLATION ABLATION;  Surgeon: [name], Vazquez;  Location: [facility] CATH LAB;  Service: Cardiovascular;  Laterality: N/A;  . CARDIAC CATHETERIZATION  09/14/2001    Mildly decreased left ventricular systolic function --  Native three vessel coronary artery disease as described. -- Status post coronary artery bypass grafting  . CORONARY ARTERY BYPASS GRAFT  2000   LIMA to LAD, SVG to second diagonal, seq SVG to ramus and OM, SVG to PDA  . HERNIA REPAIR  05/16/2001   Large left indirect inguinal hernia with right direct hernia.  Marland Kitchen HERNIA REPAIR     Recurrent left inguinal hernia -- Large left indirect inguinal hernia with right direct hernia.  Marland Kitchen LEFT HEART CATHETERIZATION WITH CORONARY/GRAFT ANGIOGRAM  11/26/2013  Procedure: LEFT HEART CATHETERIZATION WITH Keith Vazquez;  Surgeon: Keith M Martinique, Vazquez;  Location: [facility] CATH LAB;  Service: Cardiovascular;;  . TEE WITHOUT CARDIOVERSION N/A 05/06/2014   Procedure: TRANSESOPHAGEAL ECHOCARDIOGRAM (TEE);  Surgeon: Keith Vazquez;  Location: [facility] ENDOSCOPY;  Service: Cardiovascular;  Laterality: N/A;     No outpatient medications have been marked as taking for the 03/06/19 encounter (Appointment) with Martinique, Keith M, Vazquez.     Allergies:   Amiodarone; Oxycodone-acetaminophen; and Propoxyphene n-acetaminophen   Social History   Tobacco Use  . Smoking  status: Former Smoker    Packs/day: 1.00    Years: 20.00    Pack years: 20.00    Types: Cigarettes    Last attempt to quit: 10/31/1994    Years since quitting: 24.3  . Smokeless tobacco: Never Used  Substance Use Topics  . Alcohol use: No    Alcohol/week: 0.0 standard drinks  . Drug use: No     Family Hx: The patient's family history is not on file. He was adopted.  ROS:   Please see the history of present illness.    *** All other systems reviewed and are negative.   Prior CV studies:   The following studies were reviewed today:  Myoview 1/11/19Study Highlights     Nuclear stress EF: 30%.  The left ventricular ejection fraction is moderately decreased (30-44%).  Defect 1: There is a defect present in the basal inferolateral and mid inferolateral location.  Findings consistent with prior myocardial infarction.  This is a high risk study.   High risk stress nuclear study due to severely reduced global systolic function. There is evidence of an inferolateral scar (previously seen), but there is no reversible ischemia. There is global hypokinesis, worse in the inferolateral wall. LVEF has decreased slightly from 2016 (previously 36%), otherwise little change.     Echo 11/06/17: Study Conclusions  - Left ventricle: The cavity size was moderately dilated. Systolic   function was mildly to moderately reduced. The estimated ejection   fraction was in the range of 40% to 45%. Features are consistent   with a pseudonormal left ventricular filling pattern, with   concomitant abnormal relaxation and increased filling pressure   (grade 2 diastolic dysfunction). - Aortic valve: Mildly calcified annulus. - Mitral valve: Valve area by pressure half-time: 2.27 cm^2.  Labs/Other Tests and Data Reviewed:    EKG:  {EKG/Telemetry Strips Reviewed:[phone]}  Recent Labs: 05/28/2018: Magnesium 2.2 01/09/2019: ALT 6; BUN 19; Creatinine, Ser 0.96; Hemoglobin 15.5; Platelets  162.0; Potassium 4.3; Sodium 139   Recent Lipid Panel Lab Results  Component Value Date/Time   CHOL 166 01/09/2019 09:57 AM   CHOL 196 02/22/2018 10:02 AM   TRIG 167.0 (H) 01/09/2019 09:57 AM   HDL 44.00 01/09/2019 09:57 AM   HDL 44 02/22/2018 10:02 AM   CHOLHDL 4 01/09/2019 09:57 AM   LDLCALC 88 01/09/2019 09:57 AM   LDLCALC 115 (H) 02/22/2018 10:02 AM   LDLDIRECT 120.5 09/30/2013 02:00 PM    Wt Readings from Last 3 Encounters:  01/09/19 202 lb (91.6 kg)  07/05/18 197 lb 6.4 oz (89.5 kg)  05/28/18 197 lb 0.6 oz (89.4 kg)     Objective:    Vital Signs:  There were no vitals taken for this visit.   {[facility] Virtual Exam (Optional):[phone]::"VITAL SIGNS:  reviewed"}  ASSESSMENT & PLAN:    1.   CAD: He is having no ischemic symptoms.  He is on high dose statin, beta-blocker, and has sublingual  nitroglycerin.  Since he is off anticoagulation, we will restart aspirin 81 mg daily.  This was not on his AVS, the patient was called later and requested to start it.  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  2.  Chronic systolic CHF: He is compliant with medications and diet.  He is not having any symptoms of volume overload.  He is on triamterene/HCTZ, check a metabolic profile.  Check an echo.  3.  Ischemic cardiomyopathy: He is on good medical therapy with a beta-blocker and Entresto.  He has not tolerated a higher dose of Entresto in the past due to hypotension.  4.  History of PAF, status post ablation: He has had no recurrence of atrial fibrillation.  He no longer requires systemic anticoagulation.  5.  Hypertension: His blood pressures under good control on beta-blocker, Entresto and Maxide.  6.  Hyperlipidemia: His LDL goal is less than 70.  His last LDL was 104.  He is taking coenzyme Q 10, omega-3 fatty acids and niacin.  Check a lipid profile when he comes for his stress test, then discussed the change in  therapy if he is not at target.  COVID-19 Education: The signs and symptoms of COVID-19 were discussed with the patient and how to seek care for testing (follow up with PCP or arrange E-visit).  ***The importance of social distancing was discussed today.  Time:   Today, I have spent *** minutes with the patient with telehealth technology discussing the above problems.     Medication Adjustments/Labs and Tests Ordered: Current medicines are reviewed at length with the patient today.  Concerns regarding medicines are outlined above.   Tests Ordered: No orders of the defined types were placed in this encounter.   Medication Changes: No orders of the defined types were placed in this encounter.   Disposition:  Follow up {follow up:15908}  Signed, Keith Vazquez  02/28/2019 8:49 AM    [facility]

## 2019-03-04 ENCOUNTER — Telehealth: Payer: Self-pay | Admitting: Cardiology

## 2019-03-05 ENCOUNTER — Telehealth: Payer: Self-pay | Admitting: *Deleted

## 2019-03-05 NOTE — Telephone Encounter (Signed)
Called Keith Vazquez, to tell him we needed to reschedule his appointment, he said he will call us back.

## 2019-03-06 ENCOUNTER — Telehealth: Payer: Medicare Other | Admitting: Cardiology

## 2019-03-08 ENCOUNTER — Telehealth: Payer: Self-pay | Admitting: Cardiology

## 2019-03-08 NOTE — Telephone Encounter (Signed)
LVM for patient to schedule his appointment with Eulas Post.

## 2019-03-21 ENCOUNTER — Telehealth: Payer: Self-pay

## 2019-03-21 NOTE — Telephone Encounter (Signed)
New message     Just an FYI. We have made several attempts to contact this patient including sending a letter to schedule or reschedule their myocardial perfusion . We will be removing the patient from the WQ.   5.21.20 @ 9:40am Both # are the same left a message to call the office back Keith Vazquez  5.18.2020 @ 9;21am lm on home vm Keith Vazquez  5.14.20 @ 10:12am Both # are the same - lm on home vm Keith Vazquez  5.12.20 @ 11:11am lm on home vm Keith Vazquez  5.7.20 @ 3:46pm lm on home vm - Keith Vazquez  Thank you

## 2019-03-21 NOTE — Telephone Encounter (Signed)
FORWARD TO  DR Martinique 'S NURSE   UNABLE TO REACH FOR MYOVIEW

## 2019-03-26 ENCOUNTER — Ambulatory Visit (INDEPENDENT_AMBULATORY_CARE_PROVIDER_SITE_OTHER): Payer: Medicare Other | Admitting: Internal Medicine

## 2019-03-26 ENCOUNTER — Encounter: Payer: Self-pay | Admitting: Internal Medicine

## 2019-03-26 ENCOUNTER — Other Ambulatory Visit: Payer: Self-pay

## 2019-03-26 DIAGNOSIS — I2510 Atherosclerotic heart disease of native coronary artery without angina pectoris: Secondary | ICD-10-CM | POA: Diagnosis not present

## 2019-03-26 DIAGNOSIS — K594 Anal spasm: Secondary | ICD-10-CM | POA: Diagnosis not present

## 2019-03-26 MED ORDER — DICYCLOMINE HCL 10 MG PO CAPS: 10.0000 mg | ORAL_CAPSULE | Freq: Three times a day (TID) | ORAL | 0 refills | Status: DC

## 2019-03-26 NOTE — Progress Notes (Signed)
Virtual Visit via Video Note  I connected with Keith Vazquez on 03/26/19 at  3:30 PM EDT by a video enabled telemedicine application and verified that I am speaking with the correct person using two identifiers.   I discussed the limitations of evaluation and management by telemedicine and the availability of in person appointments. The patient expressed understanding and agreed to proceed.  History of Present Illness: He checked in for a virtual visit.  He was not willing to come in for an in person visit due to the COVID-19 pandemic.  He complains of anal spasms off and on for the last year.  He tells me he saw a gastroenterologist about a year ago and underwent a colonoscopy that was unremarkable.  He says the GI doctor told him to try Linzess.  He complains that Linzess made him feel worse.  He denies abdominal pain, nausea, vomiting, loss of appetite, constipation, diarrhea, melena, or bloody stools.  He says that he has painful spasms throughout the day but cannot produce any stool.    Observations/Objective: Healthy-appearing male.  No distress.  Calm, cooperative, and appropriate.  Lab Results  Component Value Date   WBC 6.5 01/09/2019   HGB 15.5 01/09/2019   HCT 45.4 01/09/2019   PLT 162.0 01/09/2019   GLUCOSE 76 01/09/2019   CHOL 166 01/09/2019   TRIG 167.0 (H) 01/09/2019   HDL 44.00 01/09/2019   LDLDIRECT 120.5 09/30/2013   LDLCALC 88 01/09/2019   ALT 6 01/09/2019   AST 19 01/09/2019   NA 139 01/09/2019   K 4.3 01/09/2019   CL 105 01/09/2019   CREATININE 0.96 01/09/2019   BUN 19 01/09/2019   CO2 28 01/09/2019   TSH 1.31 11/30/2016   PSA 0.76 03/15/2018   INR 1.00 11/26/2013     Assessment and Plan: I think he has anal spasms.  I am not sure if this is psychogenic.  I think he would benefit from trying an antispasmodic like Bentyl.  Will start at the 10 mg dose 3 times a day and will increase or decrease the dose depending on his response to it.   Follow Up  Instructions: He agrees to try Bentyl at the prescribed dose.  He will let me know his response and whether not he wants to try a higher or lower dose.  He will also let me know if he develops any new or worsening symptoms.    I discussed the assessment and treatment plan with the patient. The patient was provided an opportunity to ask questions and all were answered. The patient agreed with the plan and demonstrated an understanding of the instructions.   The patient was advised to call back or seek an in-person evaluation if the symptoms worsen or if the condition fails to improve as anticipated.  I provided 25 minutes of non-face-to-face time during this encounter.   Scarlette Calico, MD

## 2019-04-10 NOTE — Telephone Encounter (Signed)
Spoke to patient about scheduling appointment with Dr.Jordan he stated he did not need one at this time.

## 2019-04-22 ENCOUNTER — Other Ambulatory Visit: Payer: Self-pay | Admitting: Family

## 2019-04-26 ENCOUNTER — Encounter: Payer: Self-pay | Admitting: Cardiology

## 2019-04-29 NOTE — Telephone Encounter (Signed)
Opened in error

## 2019-05-02 ENCOUNTER — Telehealth (HOSPITAL_COMMUNITY): Payer: Self-pay

## 2019-05-02 NOTE — Telephone Encounter (Signed)
New message   Just an FYI. We have made several attempts to contact this patient including sending a letter to schedule or reschedule their Myocardial Perfusion . We will be removing the patient from the WQ.     6.26.20 mail reminder letter Alger Kerstein  5.21.20 @ 9:40am Both # are the same left a message to call the office back Gloriann Riede  5.18.2020 @ 9;21am lm on home vm Jayelyn Barno  5.14.20 @ 10:12am Both # are the same - lm on home vm gesial  5.12.20 @ 11:11am lm on home vm Vandella Ord  5.7.20 @ 3:46pm lm on home vm - Maverick Patman

## 2019-05-02 NOTE — Telephone Encounter (Signed)
I will make Dr.Jordan aware.

## 2019-05-08 ENCOUNTER — Encounter: Payer: Self-pay | Admitting: Internal Medicine

## 2019-05-16 ENCOUNTER — Other Ambulatory Visit: Payer: Self-pay

## 2019-05-16 ENCOUNTER — Encounter: Payer: Self-pay | Admitting: Internal Medicine

## 2019-05-16 ENCOUNTER — Other Ambulatory Visit: Payer: Self-pay | Admitting: Internal Medicine

## 2019-05-16 ENCOUNTER — Ambulatory Visit (INDEPENDENT_AMBULATORY_CARE_PROVIDER_SITE_OTHER): Payer: Medicare Other | Admitting: Internal Medicine

## 2019-05-16 VITALS — BP 132/82 | HR 74 | Temp 98.4°F | Ht 72.5 in | Wt 202.0 lb

## 2019-05-16 DIAGNOSIS — K594 Anal spasm: Secondary | ICD-10-CM | POA: Diagnosis not present

## 2019-05-16 DIAGNOSIS — I2510 Atherosclerotic heart disease of native coronary artery without angina pectoris: Secondary | ICD-10-CM | POA: Diagnosis not present

## 2019-05-16 DIAGNOSIS — G43001 Migraine without aura, not intractable, with status migrainosus: Secondary | ICD-10-CM | POA: Diagnosis not present

## 2019-05-16 DIAGNOSIS — I5022 Chronic systolic (congestive) heart failure: Secondary | ICD-10-CM | POA: Diagnosis not present

## 2019-05-16 DIAGNOSIS — K581 Irritable bowel syndrome with constipation: Secondary | ICD-10-CM | POA: Insufficient documentation

## 2019-05-16 DIAGNOSIS — Z23 Encounter for immunization: Secondary | ICD-10-CM | POA: Diagnosis not present

## 2019-05-16 DIAGNOSIS — I480 Paroxysmal atrial fibrillation: Secondary | ICD-10-CM

## 2019-05-16 MED ORDER — CILIDINIUM-CHLORDIAZEPOXIDE 2.5-5 MG PO CAPS: 1.0000 | ORAL_CAPSULE | Freq: Three times a day (TID) | ORAL | 3 refills | Status: DC

## 2019-05-16 MED ORDER — SUMATRIPTAN SUCCINATE 100 MG PO TABS: ORAL_TABLET | ORAL | 3 refills | Status: DC

## 2019-05-16 NOTE — Progress Notes (Signed)
Subjective:  Patient ID: Keith Vazquez, male    DOB: 18-Mar-1951  Age: 68 y.o. MRN: [id number]  CC: No chief complaint on file.   HPI Keith Vazquez presents for f/up - He complains of a greater than 3-year history of the constant urge to have a bowel movement.  He describes getting some symptom relief with dicyclomine but he would like to get better control of his symptoms.  He states his symptoms are also improved when he inserts Preparation H in his rectum.  He has mild intermittent bloating but denies abdominal pain, nausea, vomiting, loss of appetite, weight loss, melena, or bright red blood per rectum.  He has done some research on his own and would like to try Librax.  The Dicyclomine works fairly well to relieve my symptoms-I would say about 85%, However I have to take 4 pills per day. It's either not strong enough or it needs to last longer. I would request either a stronger dose or, if you have a better solution I would try that.    Thanks  Shelly Rubenstein     Outpatient Medications Prior to Visit  Medication Sig Dispense Refill  . carvedilol (COREG) 12.5 MG tablet Take 1 tablet (12.5 mg total) by mouth 2 (two) times daily with a meal. 90 tablet 3  . celecoxib (CELEBREX) 200 MG capsule     . montelukast (SINGULAIR) 10 MG tablet Take 1 tablet (10 mg total) by mouth at bedtime. 30 tablet 5  . omeprazole (PRILOSEC) 10 MG capsule Take 1 capsule (10 mg total) by mouth daily. 30 capsule 1  . sacubitril-valsartan (ENTRESTO) 24-26 MG Take 1 tablet by mouth 2 (two) times daily. 180 tablet 3  . vitamin E 200 UNIT capsule Take 200 Units by mouth daily.    Marland Kitchen dicyclomine (BENTYL) 10 MG capsule Take 1 capsule (10 mg total) by mouth 3 (three) times daily before meals. 270 capsule 0  . ferrous sulfate 325 (65 FE) MG tablet Take 325 mg by mouth daily with breakfast.    . HYDROcodone-acetaminophen (NORCO) 7.5-325 MG tablet     . Omega-3 Fatty Acids (FISH OIL PO) Take 1,200 mg by mouth 2 (two)  times daily.     . SUMAtriptan (IMITREX) 100 MG tablet TAKE 1 TABLET AT ONSET OF MIGRAINE- MAY REPEAT ONCE IN 2 HOURS. LIMIT2/24 HOURS. AVOID DAILY USE. 10 tablet 0   No facility-administered medications prior to visit.     ROS Review of Systems  Constitutional: Positive for unexpected weight change (wt gain). Negative for diaphoresis and fatigue.  HENT: Negative.   Eyes: Negative.   Respiratory: Negative for cough, chest tightness, shortness of breath and wheezing.   Cardiovascular: Negative for chest pain, palpitations and leg swelling.  Gastrointestinal: Positive for abdominal distention and constipation. Negative for anal bleeding, blood in stool, diarrhea, nausea and vomiting.  Endocrine: Negative.   Genitourinary: Negative.  Negative for difficulty urinating.  Musculoskeletal: Negative.  Negative for arthralgias and myalgias.  Skin: Negative.   Neurological: Negative.  Negative for dizziness, weakness and light-headedness.       His headaches are well controlled with Imitrex.  Hematological: Negative for adenopathy. Does not bruise/bleed easily.  Psychiatric/Behavioral: Negative.  Negative for dysphoric mood, sleep disturbance and suicidal ideas. The patient is not nervous/anxious.     Objective:  BP 132/82 (BP Location: Left Arm, Patient Position: Sitting, Cuff Size: Normal)   Pulse 74   Temp 98.4 F (36.9 C) (Oral)   Ht 6'  0.5" (1.842 m)   Wt 202 lb (91.6 kg)   SpO2 97%   BMI 27.02 kg/m   BP Readings from Last 3 Encounters:  05/16/19 132/82  01/09/19 116/76  07/05/18 112/76    Wt Readings from Last 3 Encounters:  05/16/19 202 lb (91.6 kg)  01/09/19 202 lb (91.6 kg)  07/05/18 197 lb 6.4 oz (89.5 kg)    Physical Exam Constitutional:      Appearance: He is not ill-appearing or diaphoretic.  HENT:     Nose: Nose normal.     Mouth/Throat:     Mouth: Mucous membranes are moist.  Eyes:     General: No scleral icterus.    Conjunctiva/sclera: Conjunctivae  normal.  Neck:     Musculoskeletal: Normal range of motion. No neck rigidity.  Cardiovascular:     Rate and Rhythm: Normal rate and regular rhythm.     Heart sounds: No murmur.  Pulmonary:     Effort: Pulmonary effort is normal.     Breath sounds: No stridor. No wheezing, rhonchi or rales.  Abdominal:     General: Abdomen is protuberant. Bowel sounds are normal. There is no distension.     Palpations: Abdomen is soft. There is no hepatomegaly.     Tenderness: There is no abdominal tenderness.     Hernia: No hernia is present.  Musculoskeletal: Normal range of motion.     Right lower leg: No edema.     Left lower leg: No edema.  Lymphadenopathy:     Cervical: No cervical adenopathy.  Skin:    General: Skin is warm and dry.     Coloration: Skin is not pale.  Neurological:     General: No focal deficit present.     Mental Status: He is alert and oriented to person, place, and time. Mental status is at baseline.  Psychiatric:        Mood and Affect: Mood normal.        Behavior: Behavior normal.     Lab Results  Component Value Date   WBC 6.5 01/09/2019   HGB 15.5 01/09/2019   HCT 45.4 01/09/2019   PLT 162.0 01/09/2019   GLUCOSE 76 01/09/2019   CHOL 166 01/09/2019   TRIG 167.0 (H) 01/09/2019   HDL 44.00 01/09/2019   LDLDIRECT 120.5 09/30/2013   LDLCALC 88 01/09/2019   ALT 6 01/09/2019   AST 19 01/09/2019   NA 139 01/09/2019   K 4.3 01/09/2019   CL 105 01/09/2019   CREATININE 0.96 01/09/2019   BUN 19 01/09/2019   CO2 28 01/09/2019   TSH 1.31 11/30/2016   PSA 0.76 03/15/2018   INR 1.00 11/26/2013    Ct Abdomen Pelvis W Contrast  Result Date: 04/24/2018 CLINICAL DATA:  Abdominal pain for 2 days.  Constipation. EXAM: CT ABDOMEN AND PELVIS WITH CONTRAST TECHNIQUE: Multidetector CT imaging of the abdomen and pelvis was performed using the standard protocol following bolus administration of intravenous contrast. CONTRAST:  124mL ISOVUE-300 IOPAMIDOL (ISOVUE-300)  INJECTION 61% COMPARISON:  None. FINDINGS: Lower chest: Numerous peripheral micronodules throughout both basilar lower lobes and right middle lobe, partially tree-in-bud in configuration. No pleural effusion. Hepatobiliary: 6 mm low-density lesion laterally in a diminutive left hepatic lobe, too small to fully characterize. Unremarkable gallbladder. No biliary dilatation. Pancreas: Unremarkable. Spleen: Unremarkable. Adrenals/Urinary Tract: Unremarkable adrenal glands. 3.8 cm left lower pole, 1.7 cm left upper pole, and 1.3 cm right upper pole renal cysts. Punctate renal calculi bilaterally. No hydronephrosis. Unremarkable bladder. Stomach/Bowel:  The stomach is within normal limits. Scattered colonic diverticulosis is noted without evidence of diverticulitis. Fluid-filled loops of distal ileum in the right mid and lower abdomen demonstrate scattered air-fluid levels and are borderline dilated with a small amount of adjacent stranding and trace paracolic gutter fluid. There is no evidence of mechanical bowel obstruction. The appendix is unremarkable. Vascular/Lymphatic: Abdominal aortic atherosclerosis without aneurysm. No enlarged lymph nodes. Reproductive: Unremarkable prostate. Other: No pneumoperitoneum. No loculated fluid collection. Small fat containing paraumbilical hernia. Musculoskeletal: No suspicious osseous lesion. Thoracolumbar disc and facet degeneration including severe disc space narrowing at L5-S1 and severe right facet arthrosis at L4-5. IMPRESSION: 1. Mildly prominent distal small bowel loops with scattered air-fluid levels and mild surrounding inflammation, query enteritis. No bowel obstruction. 2. Nonobstructing bilateral nephrolithiasis. 3. Numerous centrilobular nodules in both lung bases which may reflect infectious or inflammatory bronchiolitis. 4.  Aortic Atherosclerosis (ICD10-I70.0). Electronically Signed   By: Logan Bores M.D.   On: 04/24/2018 10:57    Assessment & Plan:   Diagnoses  and all orders for this visit:  Anal sphincter spasm -     Ambulatory referral to Gastroenterology -     dicyclomine (BENTYL) 10 MG capsule; Take 1 capsule (10 mg total) by mouth 3 (three) times daily before meals.  Chronic systolic CHF (congestive heart failure), NYHA class 1 ([place])- He has a normal fluid status.  Paroxysmal atrial fibrillation ([place])- He has good rate and rhythm control.  He is not willing to be anticoagulated.  Migraine without aura and with status migrainosus, not intractable -     SUMAtriptan (IMITREX) 100 MG tablet; TAKE 1 TABLET AT ONSET OF MIGRAINE- MAY REPEAT ONCE IN 2 HOURS. LIMIT2/24 HOURS. AVOID DAILY USE.  Irritable bowel syndrome with constipation- I prescribed Librax but he tells me it is too expensive.  I have asked him to stay on the current dose of dicyclomine and to follow-up with GI to consider additional diagnostic and treatment options. -     Discontinue: clidinium-chlordiazePOXIDE (LIBRAX) 5-2.5 MG capsule; Take 1 capsule by mouth 3 (three) times daily before meals. -     Ambulatory referral to Gastroenterology -     dicyclomine (BENTYL) 10 MG capsule; Take 1 capsule (10 mg total) by mouth 3 (three) times daily before meals.  Need for Tdap vaccination -     Tdap vaccine greater than or equal to 7yo IM  Need for pneumococcal vaccination -     Pneumococcal conjugate vaccine 13-valent   I have discontinued Keith Vazquez. Keith "Jim"'s Omega-3 Fatty Acids (FISH OIL PO), ferrous sulfate, HYDROcodone-acetaminophen, and clidinium-chlordiazePOXIDE. I am also having him maintain his vitamin E, montelukast, omeprazole, celecoxib, sacubitril-valsartan, carvedilol, SUMAtriptan, and dicyclomine.  Meds ordered this encounter  Medications  . SUMAtriptan (IMITREX) 100 MG tablet    Sig: TAKE 1 TABLET AT ONSET OF MIGRAINE- MAY REPEAT ONCE IN 2 HOURS. LIMIT2/24 HOURS. AVOID DAILY USE.    Dispense:  10 tablet    Refill:  3  . DISCONTD: clidinium-chlordiazePOXIDE  (LIBRAX) 5-2.5 MG capsule    Sig: Take 1 capsule by mouth 3 (three) times daily before meals.    Dispense:  90 capsule    Refill:  3  . dicyclomine (BENTYL) 10 MG capsule    Sig: Take 1 capsule (10 mg total) by mouth 3 (three) times daily before meals.    Dispense:  270 capsule    Refill:  1     Follow-up: Return in about 4 months (around 09/16/2019).  Marcello Moores  Ronnald Ramp, MD

## 2019-05-16 NOTE — Patient Instructions (Signed)

## 2019-05-17 ENCOUNTER — Encounter: Payer: Self-pay | Admitting: Internal Medicine

## 2019-05-17 NOTE — Telephone Encounter (Signed)
Pt is requesting a call on [phone]

## 2019-05-18 MED ORDER — DICYCLOMINE HCL 10 MG PO CAPS: 10.0000 mg | ORAL_CAPSULE | Freq: Three times a day (TID) | ORAL | 1 refills | Status: DC

## 2019-05-22 DIAGNOSIS — K588 Other irritable bowel syndrome: Secondary | ICD-10-CM | POA: Diagnosis not present

## 2019-06-03 ENCOUNTER — Other Ambulatory Visit: Payer: Self-pay

## 2019-08-06 DIAGNOSIS — G894 Chronic pain syndrome: Secondary | ICD-10-CM | POA: Diagnosis not present

## 2019-08-06 DIAGNOSIS — M47816 Spondylosis without myelopathy or radiculopathy, lumbar region: Secondary | ICD-10-CM | POA: Diagnosis not present

## 2019-08-06 DIAGNOSIS — Z79891 Long term (current) use of opiate analgesic: Secondary | ICD-10-CM | POA: Diagnosis not present

## 2019-08-23 ENCOUNTER — Other Ambulatory Visit: Payer: Self-pay

## 2019-08-23 ENCOUNTER — Ambulatory Visit (INDEPENDENT_AMBULATORY_CARE_PROVIDER_SITE_OTHER): Payer: Medicare Other

## 2019-08-23 ENCOUNTER — Telehealth: Payer: Self-pay

## 2019-08-23 DIAGNOSIS — Z23 Encounter for immunization: Secondary | ICD-10-CM | POA: Diagnosis not present

## 2019-08-23 NOTE — Progress Notes (Signed)
flu

## 2019-08-23 NOTE — Telephone Encounter (Signed)
Routing to dr Ronnald Ramp, patient would like to have sumatriptan rx refilled, thanks

## 2019-08-24 ENCOUNTER — Other Ambulatory Visit: Payer: Self-pay | Admitting: Internal Medicine

## 2019-08-24 DIAGNOSIS — G43001 Migraine without aura, not intractable, with status migrainosus: Secondary | ICD-10-CM

## 2019-08-24 MED ORDER — SUMATRIPTAN SUCCINATE 100 MG PO TABS: ORAL_TABLET | ORAL | 3 refills | Status: DC

## 2019-09-10 ENCOUNTER — Other Ambulatory Visit: Payer: Self-pay | Admitting: Internal Medicine

## 2019-09-10 DIAGNOSIS — G43001 Migraine without aura, not intractable, with status migrainosus: Secondary | ICD-10-CM

## 2019-09-10 MED ORDER — SUMATRIPTAN SUCCINATE 100 MG PO TABS: ORAL_TABLET | ORAL | 1 refills | Status: DC

## 2020-01-09 ENCOUNTER — Other Ambulatory Visit: Payer: Self-pay | Admitting: Family

## 2020-01-13 ENCOUNTER — Telehealth: Payer: Self-pay | Admitting: Cardiology

## 2020-01-13 MED ORDER — CARVEDILOL 12.5 MG PO TABS: 12.5000 mg | ORAL_TABLET | Freq: Two times a day (BID) | ORAL | 0 refills | Status: DC

## 2020-01-13 MED ORDER — ENTRESTO 24-26 MG PO TABS: 1.0000 | ORAL_TABLET | Freq: Two times a day (BID) | ORAL | 0 refills | Status: DC

## 2020-01-13 NOTE — Telephone Encounter (Signed)
New message   Patient needs a new prescription for carvedilol (COREG) 12.5 MG tablet and sacubitril-valsartan (ENTRESTO) 24-26 MG sent to [place], [place]

## 2020-01-13 NOTE — Telephone Encounter (Signed)
Spoke to patient advised I sent in 1 month refill for coreg and entresto.Last visit 11/02/2017.Advised to keep appointment already scheduled with Rosaria Ferries PA 02/05/20 at 3:45 pm.

## 2020-01-15 ENCOUNTER — Telehealth: Payer: Self-pay | Admitting: Internal Medicine

## 2020-01-15 DIAGNOSIS — I4892 Unspecified atrial flutter: Secondary | ICD-10-CM

## 2020-01-15 NOTE — Progress Notes (Signed)
  Chronic Care Management   Note  01/15/2020 Name: [name] MRN: [id number] DOB: [birth year]  OMNI HELLMICH is a 69 y.o. year old male who is a primary care patient of [name], MD. I reached out to [name] by phone today in response to a referral sent by [name]'s PCP, [name], MD.   [name] was given information about Chronic Care Management services today including:  1. CCM service includes personalized support from designated clinical staff supervised by his physician, including individualized plan of care and coordination with other care providers 2. 24/7 contact phone numbers for assistance for urgent and routine care needs. 3. Service will only be billed when office clinical staff spend 20 minutes or more in a month to coordinate care. 4. Only one practitioner may furnish and bill the service in a calendar month. 5. The patient may stop CCM services at any time (effective at the end of the month) by phone call to the office staff.   Patient agreed to services and verbal consent obtained.   Follow up plan:  [name] [facility]

## 2020-01-20 DIAGNOSIS — H2513 Age-related nuclear cataract, bilateral: Secondary | ICD-10-CM | POA: Diagnosis not present

## 2020-01-20 DIAGNOSIS — H43813 Vitreous degeneration, bilateral: Secondary | ICD-10-CM | POA: Diagnosis not present

## 2020-01-20 DIAGNOSIS — H52211 Irregular astigmatism, right eye: Secondary | ICD-10-CM | POA: Diagnosis not present

## 2020-01-20 DIAGNOSIS — H40013 Open angle with borderline findings, low risk, bilateral: Secondary | ICD-10-CM | POA: Diagnosis not present

## 2020-02-03 ENCOUNTER — Ambulatory Visit: Payer: Medicare Other | Admitting: Pharmacist

## 2020-02-03 ENCOUNTER — Other Ambulatory Visit: Payer: Self-pay

## 2020-02-03 DIAGNOSIS — E785 Hyperlipidemia, unspecified: Secondary | ICD-10-CM

## 2020-02-03 DIAGNOSIS — I5022 Chronic systolic (congestive) heart failure: Secondary | ICD-10-CM

## 2020-02-03 DIAGNOSIS — I10 Essential (primary) hypertension: Secondary | ICD-10-CM

## 2020-02-03 DIAGNOSIS — G43001 Migraine without aura, not intractable, with status migrainosus: Secondary | ICD-10-CM

## 2020-02-03 DIAGNOSIS — I2510 Atherosclerotic heart disease of native coronary artery without angina pectoris: Secondary | ICD-10-CM

## 2020-02-03 NOTE — Patient Instructions (Addendum)
Visit Information  Thank you for meeting with me to discuss your medications! I look forward to working with you to achieve your health care goals. Below is a summary of what we talked about during the visit:  Goals Addressed            This Visit's Progress   . Pharmacy Care Plan       CARE PLAN ENTRY  Current Barriers:  . Chronic Disease Management support, education, and care coordination needs related to CHF, CAD, HTN, and headaches  Pharmacist Clinical Goal(s):  Marland Kitchen Over the next 30 days, patient will work with pharmacist and cardiologist to determine best medication for breathing difficulties and headaches  Interventions: . Comprehensive medication review performed. . Recommended to discuss beta blocker with cardiologist . Plan to work with [facility] to determine which manufacturer suits best  Patient Self Care Activities:  . Self administers medications as prescribed, Calls pharmacy for medication refills, and Calls provider office for new concerns or questions  Initial goal documentation       Keith Vazquez was given information about Chronic Care Management services today including:  1. CCM service includes personalized support from designated clinical staff supervised by his physician, including individualized plan of care and coordination with other care providers 2. 24/7 contact phone numbers for assistance for urgent and routine care needs. 3. Standard insurance, coinsurance, copays and deductibles apply for chronic care management only during months in which we provide at least 20 minutes of these services. Most insurances cover these services at 100%, however patients may be responsible for any copay, coinsurance and/or deductible if applicable. This service may help you avoid the need for more expensive face-to-face services. 4. Only one practitioner may furnish and bill the service in a calendar month. 5. The patient may stop CCM services at any time  (effective at the end of the month) by phone call to the office staff.  Patient agreed to services and verbal consent obtained.   The patient verbalized understanding of instructions provided today and declined a print copy of patient instruction materials.  Telephone follow up appointment with pharmacy team member scheduled for: 3 months  Charlene Brooke, PharmD Clinical Pharmacist [facility] [phone]    General Headache Without Cause A headache is pain or discomfort felt around the head or neck area. The specific cause of a headache may not be found. There are many causes and types of headaches. A few common ones are:  Tension headaches.  Migraine headaches.  Cluster headaches.  Chronic daily headaches. Follow these instructions at home: Watch your condition for any changes. Let your health care provider know about them. Take these steps to help with your condition: Managing pain      Take over-the-counter and prescription medicines only as told by your health care provider.  Lie down in a dark, quiet room when you have a headache.  If directed, put ice on your head and neck area: ? Put ice in a plastic bag. ? Place a towel between your skin and the bag. ? Leave the ice on for 20 minutes, 2-3 times per day.  If directed, apply heat to the affected area. Use the heat source that your health care provider recommends, such as a moist heat pack or a heating pad. ? Place a towel between your skin and the heat source. ? Leave the heat on for 20-30 minutes. ? Remove the heat if your skin turns bright red.  This is especially important if you are unable to feel pain, heat, or cold. You may have a greater risk of getting burned.  Keep lights dim if bright lights bother you or make your headaches worse. Eating and drinking  Eat meals on a regular schedule.  If you drink alcohol: ? Limit how much you use to:  0-1 drink a day for women.  0-2  drinks a day for men. ? Be aware of how much alcohol is in your drink. In the [place] one drink equals one 12 oz bottle of beer (355 mL), one 5 oz glass of wine (148 mL), or one 1 oz glass of hard liquor (44 mL).  Stop drinking caffeine, or decrease the amount of caffeine you drink. General instructions   Keep a headache journal to help find out what may trigger your headaches. For example, write down: ? What you eat and drink. ? How much sleep you get. ? Any change to your diet or medicines.  Try massage or other relaxation techniques.  Limit stress.  Sit up straight, and do not tense your muscles.  Do not use any products that contain nicotine or tobacco, such as cigarettes, e-cigarettes, and chewing tobacco. If you need help quitting, ask your health care provider.  Exercise regularly as told by your health care provider.  Sleep on a regular schedule. Get 7-9 hours of sleep each night, or the amount recommended by your health care provider.  Keep all follow-up visits as told by your health care provider. This is important. Contact a health care provider if:  Your symptoms are not helped by medicine.  You have a headache that is different from the usual headache.  You have nausea or you vomit.  You have a fever. Get help right away if:  Your headache becomes severe quickly.  Your headache gets worse after moderate to intense physical activity.  You have repeated vomiting.  You have a stiff neck.  You have a loss of vision.  You have problems with speech.  You have pain in the eye or ear.  You have muscular weakness or loss of muscle control.  You lose your balance or have trouble walking.  You feel faint or pass out.  You have confusion.  You have a seizure. Summary  A headache is pain or discomfort felt around the head or neck area.  There are many causes and types of headaches. In some cases, the cause may not be found.  Keep a headache journal to  help find out what may trigger your headaches. Watch your condition for any changes. Let your health care provider know about them.  Contact a health care provider if you have a headache that is different from the usual headache, or if your symptoms are not helped by medicine.  Get help right away if your headache becomes severe, you vomit, you have a loss of vision, you lose your balance, or you have a seizure. This information is not intended to replace advice given to you by your health care provider. Make sure you discuss any questions you have with your health care provider. Document Revised: 05/07/2018 Document Reviewed: 05/07/2018 Elsevier Patient Education  [place].

## 2020-02-03 NOTE — Chronic Care Management (AMB) (Signed)
Chronic Care Management Pharmacy  Name: Keith Vazquez  MRN: [id number] DOB: [birth year]   Chief Complaint/ HPI  Keith Vazquez,  69 y.o. , male presents for their Initial CCM visit with the clinical pharmacist via telephone due to COVID-19 Pandemic.  PCP : Keith Lima, MD  Their chronic conditions include: HTN, CAD (CABG x 5 in 2000), CHF, GERD/PUD, migraines, osteoarthritis, HLD  Pt is a pilot- needs physical every 6 months.   Office Visits: 05/16/19 Dr Ronnald Ramp OV: IBS, tried Librax but too expensive, referred to GI.   Consult Visit: 05/22/19 Dr Benson Norway (GI): via claims.  Medications: Outpatient Encounter Medications as of 02/03/2020  Medication Sig  . Ascorbic Acid (VITAMIN C) 1000 MG tablet Take 1,000 mg by mouth daily.  Marland Kitchen b complex vitamins tablet Take 1 tablet by mouth daily.  . carvedilol (COREG) 12.5 MG tablet Take 1 tablet (12.5 mg total) by mouth 2 (two) times daily with a meal.  . celecoxib (CELEBREX) 200 MG capsule Take 200 mg by mouth as needed.   . cholecalciferol (VITAMIN D3) 25 MCG (1000 UNIT) tablet Take 1,000 Units by mouth daily.  Marland Kitchen co-enzyme Q-10 30 MG capsule Take 30 mg by mouth daily.  Marland Kitchen dicyclomine (BENTYL) 10 MG capsule Take 1 capsule (10 mg total) by mouth 3 (three) times daily before meals. (Patient taking differently: Take 10 mg by mouth 2 (two) times daily before a meal. )  . niacin 500 MG tablet Take 500 mg by mouth at bedtime.  Marland Kitchen omeprazole (PRILOSEC) 10 MG capsule Take 1 capsule (10 mg total) by mouth daily. (Patient taking differently: Take 10 mg by mouth as needed. )  . Probiotic Product (PROBIOTIC-10 PO) Take 1 capsule by mouth daily.   . sacubitril-valsartan (ENTRESTO) 24-26 MG Take 1 tablet by mouth 2 (two) times daily.  . SUMAtriptan (IMITREX) 100 MG tablet TAKE 1 TABLET AT ONSET OF MIGRAINE- MAY REPEAT ONCE IN 2 HOURS. LIMIT2/24 HOURS. AVOID DAILY USE.  Marland Kitchen TURMERIC PO Take 1 tablet by mouth daily.  . vitamin E 200 UNIT capsule Take 200 Units  by mouth daily.  . montelukast (SINGULAIR) 10 MG tablet Take 1 tablet (10 mg total) by mouth at bedtime. (Patient not taking: Reported on 02/03/2020)   No facility-administered encounter medications on file as of 02/03/2020.     Current Diagnosis/Assessment:  SDOH Interventions     Most Recent Value  SDOH Interventions  SDOH Interventions for the Following Domains  Stress  Stress Interventions  Other (Comment) [managing stress well at the moment]      Goals Addressed            This Visit's Progress   . Pharmacy Care Plan       CARE PLAN ENTRY  Current Barriers:  . Chronic Disease Management support, education, and care coordination needs related to CHF, CAD, HTN, and headaches  Pharmacist Clinical Goal(s):  Marland Kitchen Over the next 30 days, patient will work with pharmacist and cardiologist to determine best medication for breathing difficulties and headaches  Interventions: . Comprehensive medication review performed. . Recommended to discuss beta blocker with cardiologist . Plan to work with [facility] to determine which manufacturer suits best  Patient Self Care Activities:  . Self administers medications as prescribed, Calls pharmacy for medication refills, and Calls provider office for new concerns or questions  Initial goal documentation       Heart Failure/HTN   Type: Systolic  Last ejection fraction: 40-45% (11/06/2017) NYHA  Class: I (no actitivty limitation) AHA HF Stage: B (Heart disease present - no symptoms present)  Office blood pressures are  BP Readings from Last 3 Encounters:  05/16/19 132/82  01/09/19 116/76  07/05/18 112/76   Patient checks BP at home weekly  Patient home BP readings are ranging: "good"  Patient has failed these meds in past: amiodarone, lisinopril, ramipril   Patient is currently controlled on the following medications: carvedilol 12.5 mg BID, Entresto 24-26 mg BID  We discussed diet and exercise extensively, pt reports  issues with certain lots of carvedilol at pharmacy - he does best with Zeta brand. Other brands cause him to feel dizzy upon standing, and get winded going up stairs - per [facility] the other brand they carry is [employer]. Entresto also made him feel dizzy at higher dose.  Pt is interested in finding the best possible drug/manufacturer that helps his sx the most. He is doing well with the current regimen but wants to try other options to see if they work even better. Pt has appt with cardiologist this week, plans to discuss then.   Plan  Continue current medications and control with diet and exercise    Hyperlipidemia/CAD   Lipid Panel     Component Value Date/Time   CHOL 166 01/09/2019 0957   CHOL 196 02/22/2018 1002   TRIG 167.0 (H) 01/09/2019 0957   HDL 44.00 01/09/2019 0957   HDL 44 02/22/2018 1002   CHOLHDL 4 01/09/2019 0957   VLDL 33.4 01/09/2019 0957   LDLCALC 88 01/09/2019 0957   LDLCALC 115 (H) 02/22/2018 1002    Per cardiology (11/02/2017): pt is reluctant to take statins due to concern over side effects. As long as LDL is at goal cardiologist is ok with this.   Patient has failed these meds in past: pravastatin, rosuvastatin Patient is currently controlled on the following medications: CoQ10, niacin  We discussed:  diet and exercise extensively. Pt is no longer taking aspirin - he used to take a lot of aspirin for headaches, and had a GI bleed because of it, so he is wary of aspirin. Discussed benefits of aspirin after CABG - pt has been very stable since surgery 21 years ago, recommended that he discuss aspirin with cardiologist. He also quit taking fish oil 2 mos ago, has not had headaches since.   Plan  Continue current medications and control with diet and exercise   GERD/PUD   Patient has failed these meds in past: n/a Patient is currently controlled on the following medications: omeprazole 10 mg PRN, dicyclomine 10 mg QD, probiotic  We discussed:  Pt takes  omeprazole and dicyclomine prn, once he started taking a probiotic his GI issues have vastly improved.  Plan  Continue current medications    Osteoarthritis   Patient has failed these meds in past: n/a Patient is currently controlled on the following medications: celecoxib 200 mg (not filled recently)  We discussed:  Pt uses celebrex very sparingly for back pain.  Plan  Continue current medications    Headaches   Patient has failed these meds in past: amitriptyline Patient is currently controlled on the following medications: sumatriptan 100 mg PRN  We discussed:  Pt reports he's had headaches all his life, about 6-7 years they got worse and more frequent, sumatriptan stops HA very well. Takes 1/3 of tablet to stop HA. Rarely takes 2/3 within 24 hrs. He reports he has been to migraine specialists and he does not believe he has  migraines. He noticed Enresto helps prevent his headaches, and he has not had headaches in 2 months since stopping fish oil.   Plan  Continue current medications    Health Maintenance   Patient is currently controlled on the following medications: Vitamin E, Vitamin B, Vitamin C, Vitamin D, turmeric, CoQ10, niacin  We discussed:  Patient is satisfied with current OTC regimen and denies issues  Plan  Continue current medications   Medication Management   Pt uses [facility] for all medications Does not use pill box Pt endorses 100% compliance  We discussed: benefits of med sync, packaging and delivery with [facility], pt has been with [facility] for many years and would prefer to stay.  Plan  Continue current medication management strategy     Follow up: 3 month phone visit  Charlene Brooke, PharmD Clinical Pharmacist [facility] [phone]

## 2020-02-04 NOTE — Progress Notes (Signed)
I have collaborated with the care management provider regarding care management and care coordination activities outlined in this encounter and have reviewed this encounter including documentation in the note and care plan. I am certifying that I agree with the content of this note and encounter as supervising physician.  

## 2020-02-04 NOTE — Progress Notes (Addendum)
Patient called back, he would like to try [facility] after all.   Verbal consent obtained for [facility] enhanced pharmacy services (medication synchronization, adherence packaging, delivery coordination). A medication sync plan was created to allow patient to get all medications delivered once every 30 to 90 days per patient preference. Patient understands they have freedom to choose pharmacy and clinical pharmacist will coordinate care between all prescribers and [facility].  Goals Addressed            This Visit's Progress   . Pharmacy Care Plan       CARE PLAN ENTRY  Current Barriers:  . Chronic Disease Management support, education, and care coordination needs related to CHF, CAD, HTN, and headaches  Pharmacist Clinical Goal(s):  Marland Kitchen Over the next 30 days, patient will work with pharmacist and cardiologist to determine best medication for breathing difficulties and headaches  Interventions: . Comprehensive medication review performed. . Patient will discuss beta blocker with cardiologist . Utilize [facility] pharmacy for medication synchronization, packaging and delivery  Patient Self Care Activities:  . Self administers medications as prescribed, Calls pharmacy for medication refills, and Calls provider office for new concerns or questions  Initial goal documentation       Plan: Utilize [facility] pharmacy for medication synchronization, packaging and delivery  Spent 1 hour coordinating care with patient, specialists and pharmacy.

## 2020-02-05 ENCOUNTER — Encounter: Payer: Self-pay | Admitting: Physician Assistant

## 2020-02-05 ENCOUNTER — Ambulatory Visit (INDEPENDENT_AMBULATORY_CARE_PROVIDER_SITE_OTHER): Payer: Medicare Other | Admitting: Physician Assistant

## 2020-02-05 ENCOUNTER — Other Ambulatory Visit: Payer: Self-pay

## 2020-02-05 VITALS — BP 112/68 | HR 72 | Ht 72.5 in | Wt 193.0 lb

## 2020-02-05 DIAGNOSIS — I480 Paroxysmal atrial fibrillation: Secondary | ICD-10-CM

## 2020-02-05 DIAGNOSIS — I10 Essential (primary) hypertension: Secondary | ICD-10-CM | POA: Diagnosis not present

## 2020-02-05 DIAGNOSIS — I5022 Chronic systolic (congestive) heart failure: Secondary | ICD-10-CM

## 2020-02-05 DIAGNOSIS — E785 Hyperlipidemia, unspecified: Secondary | ICD-10-CM

## 2020-02-05 DIAGNOSIS — I2510 Atherosclerotic heart disease of native coronary artery without angina pectoris: Secondary | ICD-10-CM | POA: Diagnosis not present

## 2020-02-05 MED ORDER — ENTRESTO 24-26 MG PO TABS: 1.0000 | ORAL_TABLET | Freq: Two times a day (BID) | ORAL | 3 refills | Status: DC

## 2020-02-05 MED ORDER — CARVEDILOL 12.5 MG PO TABS: 12.5000 mg | ORAL_TABLET | Freq: Two times a day (BID) | ORAL | 3 refills | Status: DC

## 2020-02-05 NOTE — Progress Notes (Signed)
Cardiology Office Note   Date:  02/05/2020   ID:  Keith Vazquez, DOB 1951-10-23, MRN [id number]  PCP:  [name], MD Cardiologist:  Peter Martinique, MD 12/26/2016 Electrphysiologist: Thompson Grayer, MD 11/25/2016 Rosaria Ferries, PA-C   No chief complaint on file.   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 LIMA atretic, other grafts patent 2015 cath, DJD, GERD, HTN, OA  Keith Vazquez presents for cardiology follow up.   He has not flown in the last year. He will fly again soon.   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 He mostly works on [employer].   He never gets chest pain. 2-3 x a year, he will wake up not being able to breathe well. He feels his HR is very rapid. The sx will last for a while. They resolve eventually, and he does not have to seek help. He has taken an extra Coreg at some point and this may have helped. His HR will get up to 160, but he questions the accuracy.   He quit taking the [facility] for a while, but his HA increased and he started back on the medication.   No LE edema, no orthopnea or PND.   No chest pain.    Past Medical History:  Diagnosis Date  . Atrial flutter ([place])   . CHF (congestive heart failure) (HCC)    LVEF at time of cath 2002 50%; now has been 35-40% since 2008 (patient has seen multiple cardiologists over the years including [facility], [place], [place] [facility], [facility] and Tollie Eth, MD)  . Coronary artery disease    CABG x5 in 2000; s/p cath 2002 > 4/5 grafts patent, LVEF 50%  . Degeneration of lumbar intervertebral disc   . GERD (gastroesophageal reflux disease)   . GI bleed    duodenal ulcer  . Hypertension    Borderline  . Ischemic heart disease    s/p MI, CABG 2000, s/p cath 2002 with 4/5 grafts patent (LIMA to LAD atretic and  occluded mid vessel but adequate flow to distal LAD from collaterals / diagonal  . Low back pain   . PAC (premature atrial contraction)   . PAF (paroxysmal atrial fibrillation) ([place])    initially diagnosed during stress test 2002  . Peyronie's disease     Past Surgical History:  Procedure Laterality Date  . ABLATION  05-06-2014   PVI and CTI ablation by Dr [name]  . ATRIAL FIBRILLATION ABLATION N/A 05/06/2014   Procedure: ATRIAL FIBRILLATION ABLATION;  Surgeon: [name], MD;  Location: [facility] CATH LAB;  Service: Cardiovascular;  Laterality: N/A;  . CARDIAC CATHETERIZATION  09/14/2001    Mildly decreased left ventricular systolic function --  Native three vessel coronary artery disease as described. -- Status post coronary artery bypass grafting  . CORONARY ARTERY BYPASS GRAFT  2000   LIMA to LAD, SVG to second diagonal, seq SVG to ramus and OM, SVG to PDA  . HERNIA REPAIR  05/16/2001   Large left indirect inguinal hernia with right direct hernia.  Marland Kitchen HERNIA REPAIR     Recurrent left inguinal hernia -- Large left indirect inguinal hernia with right direct hernia.  Marland Kitchen LEFT HEART CATHETERIZATION WITH CORONARY/GRAFT ANGIOGRAM  11/26/2013   Procedure: LEFT HEART CATHETERIZATION WITH [name];  Surgeon: Ander Slade  Martinique, MD;  Location: [facility] CATH LAB;  Service: Cardiovascular;;  . TEE WITHOUT CARDIOVERSION N/A 05/06/2014   Procedure: TRANSESOPHAGEAL ECHOCARDIOGRAM (TEE);  Surgeon: [name], MD;  Location: [facility] ENDOSCOPY;  Service: Cardiovascular;  Laterality: N/A;    Current Outpatient Medications  Medication Sig Dispense Refill  . Ascorbic Acid (VITAMIN C) 1000 MG tablet Take 1,000 mg by mouth daily.    Marland Kitchen b complex vitamins tablet Take 1 tablet by mouth daily.    . carvedilol (COREG) 12.5 MG tablet Take 1 tablet (12.5 mg total) by mouth 2 (two) times daily with a meal. 60 tablet 0  . celecoxib (CELEBREX) 200 MG capsule Take 200 mg by mouth as needed.     . cholecalciferol  (VITAMIN D3) 25 MCG (1000 UNIT) tablet Take 1,000 Units by mouth daily.    Marland Kitchen co-enzyme Q-10 30 MG capsule Take 30 mg by mouth daily.    Marland Kitchen dicyclomine (BENTYL) 10 MG capsule Take 1 capsule (10 mg total) by mouth 3 (three) times daily before meals. (Patient taking differently: Take 10 mg by mouth 2 (two) times daily before a meal. ) 270 capsule 1  . montelukast (SINGULAIR) 10 MG tablet Take 1 tablet (10 mg total) by mouth at bedtime. 30 tablet 5  . niacin 500 MG tablet Take 500 mg by mouth at bedtime.    Marland Kitchen omeprazole (PRILOSEC) 10 MG capsule Take 1 capsule (10 mg total) by mouth daily. (Patient taking differently: Take 10 mg by mouth as needed. ) 30 capsule 1  . Probiotic Product (PROBIOTIC-10 PO) Take 1 capsule by mouth daily.     . sacubitril-valsartan (ENTRESTO) 24-26 MG Take 1 tablet by mouth 2 (two) times daily. 60 tablet 0  . SUMAtriptan (IMITREX) 100 MG tablet TAKE 1 TABLET AT ONSET OF MIGRAINE- MAY REPEAT ONCE IN 2 HOURS. LIMIT2/24 HOURS. AVOID DAILY USE. 30 tablet 1  . TURMERIC PO Take 1 tablet by mouth daily.    . vitamin E 200 UNIT capsule Take 200 Units by mouth daily.     No current facility-administered medications for this visit.    Allergies:   Amiodarone, Oxycodone-acetaminophen, and Propoxyphene n-acetaminophen    Social History:  The patient  reports that he quit smoking about 25 years ago. His smoking use included cigarettes. He has a 20.00 pack-year smoking history. He has never used smokeless tobacco. He reports that he does not drink alcohol or use drugs.   Family History:  The patient's family history is not on file. He was adopted.  is adopted.     ROS:  Please see the history of present illness. All other systems are reviewed and negative.    PHYSICAL EXAM: VS:  BP 112/68   Pulse 72   Ht 6' 0.5" (1.842 m)   Wt 193 lb (87.5 kg)   BMI 25.82 kg/m  , BMI Body mass index is 25.82 kg/m. GEN: Well nourished, well developed, male in no acute distress HEENT: normal  for age  Neck: no JVD, no carotid bruit, no masses Cardiac: RRR; no murmur, no rubs, or gallops Respiratory:  clear to auscultation bilaterally, normal work of breathing GI: soft, nontender, nondistended, + BS MS: no deformity or atrophy; no edema; distal pulses are 2+ in all 4 extremities  Skin: warm and dry, no rash Neuro:  Strength and sensation are intact Psych: euthymic mood, full affect   EKG:  EKG is ordered today. The ekg ordered today demonstrates SR. HR 72, no acute ischemic changes, Q  waves in leads III and aVF are old  ECHO: 11/06/2017 - Left ventricle: The cavity size was moderately dilated. Systolic  function was mildly to moderately reduced. The estimated ejection  fraction was in the range of 40% to 45%. Features are consistent  with a pseudonormal left ventricular filling pattern, with  concomitant abnormal relaxation and increased filling pressure  (grade 2 diastolic dysfunction).  - Aortic valve: Mildly calcified annulus.  - Mitral valve: Valve area by pressure half-time: 2.27 cm^2.   CATH: 11/26/2013 Left mainstem: Normal  Left anterior descending (LAD):  100% occlusion in the mid vessel after the first diagonal and septal perforator.   Ramus intermediate: 40% proximal disease.  Left circumflex (LCx):  100% occlusion in the mid vessel.   Right coronary artery (RCA):  100% occlusion proximally.  SVG to the PDA is widely patent and fills the entire RCA.  SVG to the second diagonal is widely patent and fills the mid to distal LAD.  SVG sequential to the ramus intermediate and OM is widely patent.  LIMA to the LAD is atretic.  Left ventriculography: Left ventricular size is enlarged, LVEF is estimated at 30%, There is severe global hypokinesis, there is no significant mitral regurgitation   Final Conclusions:   1. Severe 3 vessel obstructive CAD 2. SVG to the diagonal is patent 3. SVG sequential to the ramus intermediate and OM is  patent. 4. SVG to the PDA is patent.  5. LIMA to the LAD is atretic but the LAD fills well from the SVG to the diagonal. 6. Severe LV dysfunction.  Recommendations: Medical  management to optimize CHF therapy.  MYOVIEW: 11/10/2017  Nuclear stress EF: 30%.  The left ventricular ejection fraction is moderately decreased (30-44%).  Defect 1: There is a defect present in the basal inferolateral and mid inferolateral location.  Findings consistent with prior myocardial infarction.  This is a high risk study.   High risk stress nuclear study due to severely reduced global systolic function. There is evidence of an inferolateral scar (previously seen), but there is no reversible ischemia. There is global hypokinesis, worse in the inferolateral wall. LVEF has decreased slightly from 2016 (previously 36%), otherwise little change.   Recent Labs: No results found for requested labs within last 8760 hours.  CBC    Component Value Date/Time   WBC 6.5 01/09/2019 0957   RBC 4.99 01/09/2019 0957   HGB 15.5 01/09/2019 0957   HCT 45.4 01/09/2019 0957   PLT 162.0 01/09/2019 0957   MCV 91.1 01/09/2019 0957   MCH 31.7 04/24/2018 0301   MCHC 34.1 01/09/2019 0957   RDW 13.7 01/09/2019 0957   LYMPHSABS 1.4 01/09/2019 0957   MONOABS 0.4 01/09/2019 0957   EOSABS 0.2 01/09/2019 0957   BASOSABS 0.1 01/09/2019 0957   CMP Latest Ref Rng & Units 01/09/2019 05/28/2018 04/24/2018  Glucose 70 - 99 mg/dL 76 107(H) 139(H)  BUN 6 - 23 mg/dL 19 11 15   Creatinine 0.40 - 1.50 mg/dL 0.96 0.94 1.04  Sodium 135 - 145 mEq/L 139 139 138  Potassium 3.5 - 5.1 mEq/L 4.3 4.5 4.0  Chloride 96 - 112 mEq/L 105 103 106  CO2 19 - 32 mEq/L 28 28 21(L)  Calcium 8.4 - 10.5 mg/dL 9.4 9.5 10.1  Total Protein 6.0 - 8.3 g/dL 7.0 7.4 -  Total Bilirubin 0.2 - 1.2 mg/dL 0.4 0.5 -  Alkaline Phos 39 - 117 U/L 73 67 -  AST 0 - 37 U/L 19 17 -  ALT 0 - 53 U/L 6 7 -     Lipid Panel Lab Results  Component Value Date   CHOL  166 01/09/2019   HDL 44.00 01/09/2019   LDLCALC 88 01/09/2019   LDLDIRECT 120.5 09/30/2013   TRIG 167.0 (H) 01/09/2019   CHOLHDL 4 01/09/2019      Wt Readings from Last 3 Encounters:  02/05/20 193 lb (87.5 kg)  05/16/19 202 lb (91.6 kg)  01/09/19 202 lb (91.6 kg)     Other studies Reviewed: Additional studies/ records that were reviewed today include: Office notes, hospital records and testing.  ASSESSMENT AND PLAN:  1.  CAD: -He is on a beta-blocker -He was on aspirin previously, but it disappeared off his med list in July 2019, for unclear reasons.  At the time, he was having GI issues and they may have told him to stop it because he had a colonoscopy and EGD.  However, he should have restarted it after that. This was not noticed until after he left the office, we contacted him and he will restart the aspirin.  2.  Chronic systolic CHF: -His weight is down almost 10 pounds from July 2020. -He is on the carvedilol and Entresto, compliant with both. -I emphasized the importance of compliance with the medications. -As he is not having any CHF symptoms, I will hold off on checking an echo -  He did not tolerate a higher dose of Entresto. -We will try to increase the beta-blocker  3.  Hypertension: - Mr. Lelli wishes to push the envelope regarding his beta-blocker. -Increase the carvedilol to 18.75 mg twice daily and see how tolerated. -Refill the carvedilol 12.5 mg twice daily for now, and change the prescription if he tolerates the higher dose.   4.  Hyperlipidemia: -His labs are followed by his PCP -He is not on a statin, but is taking coenzyme Q 10 and niacin. -His LDL was 88 when it was checked last year, it is time to check it again -Target LDL is less than 70, he may need referral to the lipid clinic  5.  Palpitations, history of PAF: -With a heart rate of 160, it would be good to know what the rhythm is. -However, the episodes only occur 2 or 3 times a year, an  event monitor would not be helpful. -Increase the beta-blocker as described above.  If he continues to have episodes, even infrequently, we need to make sure that it is not VT.  Consider Linq, but will leave to Dr. Martinique and Dr. [name]   Current medicines are reviewed at length with the patient today.  The patient has concerns regarding medicines.  Concerns were addressed  The following changes have been made: Increase carvedilol per instructions  Labs/ tests ordered today include:  No orders of the defined types were placed in this encounter.    Disposition:   FU with Peter Martinique, MD  Signed, Rosaria Ferries, PA-C  02/05/2020 4:28 PM    [place] Phone: [phone]; Fax: [phone]

## 2020-02-05 NOTE — Patient Instructions (Signed)
Medication Instructions:   Try increasing Carvedilol to 1 & 1/2 tablets twice daily. Call if your blood pressure tolerates this.  *If you need a refill on your cardiac medications before your next appointment, please call your pharmacy*   Follow-Up: At [facility], you and your health needs are our priority.  As part of our continuing mission to provide you with exceptional heart care, we have created designated Provider Care Teams.  These Care Teams include your primary Cardiologist (physician) and Advanced Practice Providers (APPs -  Physician Assistants and Nurse Practitioners) who all work together to provide you with the care you need, when you need it.  We recommend signing up for the patient portal called "MyChart".  Sign up information is provided on this After Visit Summary.  MyChart is used to connect with patients for Virtual Visits (Telemedicine).  Patients are able to view lab/test results, encounter notes, upcoming appointments, etc.  Non-urgent messages can be sent to your provider as well.   To learn more about what you can do with MyChart, go to [email or web address].    Your next appointment:   1 year(s)  The format for your next appointment:   In Person  Provider:   You may see Peter Martinique, MD or one of the following Advanced Practice Providers on your designated Care Team:    Rosaria Ferries, PA-C

## 2020-02-07 ENCOUNTER — Telehealth: Payer: Self-pay

## 2020-02-07 NOTE — Telephone Encounter (Signed)
-----   Message from Charlton Haws, [facility] sent at 02/04/2020  2:36 PM EDT ----- Regarding: Med refills Hi, can you send sumatriptan and dicyclomine to [facility] for this patient?

## 2020-02-07 NOTE — Telephone Encounter (Signed)
Can you please call pt, he is over due for an appt and is wanting rx sent to new pharm ([facility]).

## 2020-02-07 NOTE — Telephone Encounter (Signed)
LVM for patient to call back and schedule a follow up. Please schedule once he calls back.

## 2020-02-08 NOTE — Addendum Note (Signed)
Addended by: Aviva Signs M on: 02/08/2020 07:01 AM   Modules accepted: Orders

## 2020-02-08 NOTE — Telephone Encounter (Signed)
I have collaborated with the care management provider regarding care management and care coordination activities outlined in this encounter and have reviewed this encounter including documentation in the note and care plan. I am certifying that I agree with the content of this note and encounter as supervising physician.  

## 2020-02-14 ENCOUNTER — Telehealth: Payer: Self-pay | Admitting: Physician Assistant

## 2020-02-14 DIAGNOSIS — I5022 Chronic systolic (congestive) heart failure: Secondary | ICD-10-CM

## 2020-02-14 DIAGNOSIS — I480 Paroxysmal atrial fibrillation: Secondary | ICD-10-CM

## 2020-02-14 DIAGNOSIS — I2510 Atherosclerotic heart disease of native coronary artery without angina pectoris: Secondary | ICD-10-CM

## 2020-02-14 MED ORDER — CARVEDILOL 12.5 MG PO TABS: 18.7500 mg | ORAL_TABLET | Freq: Two times a day (BID) | ORAL | 3 refills | Status: DC

## 2020-02-14 NOTE — Telephone Encounter (Signed)
   Spoke with patient regarding Linq monitor.  Described the device and the process.  Keith Vazquez says he is doing very well on the increased dose of carvedilol.  However, he did have an episode the other night where he woke up suddenly and wondered if he might have been having palpitations.  After he woke up, he checked his heart rate and blood pressure and both were normal although his heart rate was a little low at 55.  He says his heart rate is in the 50s at times, this is not unusual for him.  He says he is completely asymptomatic and doing very well right now so is not feeling that he needs to get the monitor.  He also states that most of the episodes occur in the fall and winter, so he may change his mind in the next few months.  If he changes his mind about getting the monitor, he will let me know.  I will send in a new prescription for the carvedilol with the increased dose of 18.75 mg twice daily.  He is to let us know if his symptoms change and keep follow-up appointments as scheduled.  Rosaria Ferries, PA-C 02/14/2020 2:33 PM

## 2020-02-17 DIAGNOSIS — H2513 Age-related nuclear cataract, bilateral: Secondary | ICD-10-CM | POA: Diagnosis not present

## 2020-02-17 DIAGNOSIS — H2510 Age-related nuclear cataract, unspecified eye: Secondary | ICD-10-CM | POA: Diagnosis not present

## 2020-02-18 ENCOUNTER — Other Ambulatory Visit: Payer: Self-pay | Admitting: Physician Assistant

## 2020-02-26 ENCOUNTER — Telehealth: Payer: Self-pay

## 2020-02-26 DIAGNOSIS — I480 Paroxysmal atrial fibrillation: Secondary | ICD-10-CM

## 2020-02-26 DIAGNOSIS — I5022 Chronic systolic (congestive) heart failure: Secondary | ICD-10-CM

## 2020-02-26 NOTE — Telephone Encounter (Signed)
Order for echo placed.  Message sent via mychart to patient.

## 2020-02-26 NOTE — Telephone Encounter (Signed)
-----   Message from Thompson Grayer, MD sent at 02/25/2020 11:28 PM EDT ----- [name]!  [place], please schedule patient to see me in office for LINQ.  [name], can we order an echo prior to the visit?  Last echo was 2019.  Thanks [name]!   ----- Message ----- From: Reola Mosher Sent: 02/25/2020  12:10 PM EDT To: Thompson Grayer, MD, Lonn Georgia, PA-C, #  [name] has decided he would like to have the [facility] monitor inserted.  How do we proceed? Let me know what I need to do. Thanks [name]

## 2020-03-03 DIAGNOSIS — H2512 Age-related nuclear cataract, left eye: Secondary | ICD-10-CM | POA: Diagnosis not present

## 2020-03-03 DIAGNOSIS — H25812 Combined forms of age-related cataract, left eye: Secondary | ICD-10-CM | POA: Diagnosis not present

## 2020-03-11 DIAGNOSIS — H2511 Age-related nuclear cataract, right eye: Secondary | ICD-10-CM | POA: Diagnosis not present

## 2020-03-16 ENCOUNTER — Encounter: Payer: Self-pay | Admitting: Family

## 2020-03-16 ENCOUNTER — Other Ambulatory Visit: Payer: Self-pay

## 2020-03-16 ENCOUNTER — Ambulatory Visit (INDEPENDENT_AMBULATORY_CARE_PROVIDER_SITE_OTHER): Payer: Medicare Other | Admitting: Family

## 2020-03-16 ENCOUNTER — Ambulatory Visit (HOSPITAL_COMMUNITY): Payer: Medicare Other | Attending: Cardiology

## 2020-03-16 ENCOUNTER — Ambulatory Visit (INDEPENDENT_AMBULATORY_CARE_PROVIDER_SITE_OTHER)
Admission: RE | Admit: 2020-03-16 | Discharge: 2020-03-16 | Disposition: A | Discharge status: Home / Self Care | Payer: Medicare Other | Source: Ambulatory Visit | Attending: Family | Admitting: Family

## 2020-03-16 VITALS — BP 120/74 | HR 66 | Temp 98.2°F | Ht 72.5 in | Wt 198.0 lb

## 2020-03-16 DIAGNOSIS — J329 Chronic sinusitis, unspecified: Secondary | ICD-10-CM

## 2020-03-16 DIAGNOSIS — G43001 Migraine without aura, not intractable, with status migrainosus: Secondary | ICD-10-CM | POA: Diagnosis not present

## 2020-03-16 DIAGNOSIS — M545 Low back pain, unspecified: Secondary | ICD-10-CM

## 2020-03-16 DIAGNOSIS — I480 Paroxysmal atrial fibrillation: Secondary | ICD-10-CM | POA: Diagnosis not present

## 2020-03-16 DIAGNOSIS — G8929 Other chronic pain: Secondary | ICD-10-CM

## 2020-03-16 DIAGNOSIS — I5022 Chronic systolic (congestive) heart failure: Secondary | ICD-10-CM | POA: Diagnosis not present

## 2020-03-16 MED ORDER — SUMATRIPTAN SUCCINATE 100 MG PO TABS: ORAL_TABLET | ORAL | 1 refills | Status: DC

## 2020-03-16 MED ORDER — DULOXETINE HCL 30 MG PO CPEP: 30.0000 mg | ORAL_CAPSULE | Freq: Every day | ORAL | 1 refills | Status: DC

## 2020-03-16 NOTE — Progress Notes (Signed)
Keith Vazquez is a 69 y.o. male with the following history as recorded in EpicCare:  Patient Active Problem List   Diagnosis Date Noted  . Irritable bowel syndrome with constipation 05/16/2019  . Anal sphincter spasm 03/26/2019  . Seasonal and perennial allergic rhinitis 07/05/2018  . Routine general medical examination at a health care facility 12/04/2016  . Migraine without aura and with status migrainosus, not intractable 11/29/2016  . Erectile dysfunction due to arterial insufficiency 11/29/2016  . Other hemorrhoids 02/24/2015  . BPH (benign prostatic hyperplasia) 02/24/2015  . Bilateral carpal tunnel syndrome 02/24/2015  . Atrial flutter ([place]) 03/20/2014  . Chronic systolic heart failure ([place]) 11/28/2013  . Coronary artery disease   . PUD (peptic ulcer disease) 07/22/2013  . Atrial fibrillation ([place]) 10/21/2011  . Hyperlipidemia with target low density lipoprotein (LDL) cholesterol less than 70 mg/dL 08/11/2010  . ISCHEMIC CARDIOMYOPATHY 08/11/2010  . Essential hypertension 08/10/2010  . OSTEOARTHRITIS, KNEE, RIGHT 01/20/2010  . GERD 04/23/2008    Current Outpatient Medications  Medication Sig Dispense Refill  . Ascorbic Acid (VITAMIN C) 1000 MG tablet Take 1,000 mg by mouth daily.    Marland Kitchen b complex vitamins tablet Take 1 tablet by mouth daily.    . carvedilol (COREG) 12.5 MG tablet Take 1.5 tablets (18.75 mg total) by mouth 2 (two) times daily with a meal. 270 tablet 3  . celecoxib (CELEBREX) 200 MG capsule Take 200 mg by mouth as needed.     . cholecalciferol (VITAMIN D3) 25 MCG (1000 UNIT) tablet Take 1,000 Units by mouth daily.    Marland Kitchen co-enzyme Q-10 30 MG capsule Take 30 mg by mouth daily.    Marland Kitchen dicyclomine (BENTYL) 10 MG capsule Take 1 capsule (10 mg total) by mouth 3 (three) times daily before meals. (Patient taking differently: Take 10 mg by mouth 2 (two) times daily before a meal. ) 270 capsule 1  . dicyclomine (BENTYL) 20 MG tablet Take 20 mg by mouth 4 (four) times  daily.    Marland Kitchen LOTEMAX SM 0.38 % GEL     . montelukast (SINGULAIR) 10 MG tablet Take 1 tablet (10 mg total) by mouth at bedtime. 30 tablet 5  . niacin 500 MG tablet Take 500 mg by mouth at bedtime.    Marland Kitchen omeprazole (PRILOSEC) 10 MG capsule Take 1 capsule (10 mg total) by mouth daily. (Patient taking differently: Take 10 mg by mouth as needed. ) 30 capsule 1  . Probiotic Product (PROBIOTIC-10 PO) Take 1 capsule by mouth daily.     Marland Kitchen PROLENSA 0.07 % SOLN     . sacubitril-valsartan (ENTRESTO) 24-26 MG Take 1 tablet by mouth 2 (two) times daily. 180 tablet 3  . SUMAtriptan (IMITREX) 100 MG tablet TAKE 1 TABLET AT ONSET OF MIGRAINE- MAY REPEAT ONCE IN 2 HOURS. LIMIT2/24 HOURS. AVOID DAILY USE. 30 tablet 1  . TURMERIC PO Take 1 tablet by mouth daily.    . vitamin E 200 UNIT capsule Take 200 Units by mouth daily.    . DULoxetine (CYMBALTA) 30 MG capsule Take 1 capsule (30 mg total) by mouth daily. 30 capsule 1   No current facility-administered medications for this visit.    Allergies: Amiodarone, Oxycodone-acetaminophen, and Propoxyphene n-acetaminophen  Past Medical History:  Diagnosis Date  . Atrial flutter ([place])   . CHF (congestive heart failure) (HCC)    LVEF at time of cath 2002 50%; now has been 35-40% since 2008 (patient has seen multiple cardiologists over the years including [facility], [place], [facility], [facility] and Tollie Eth, MD)  . Coronary artery disease    CABG x5 in 2000; s/p cath 2002 > 4/5 grafts patent, LVEF 50%  . Degeneration of lumbar intervertebral disc   . GERD (gastroesophageal reflux disease)   . GI bleed    duodenal ulcer  . Hypertension    Borderline  . Ischemic heart disease    s/p MI, CABG 2000, s/p cath 2002 with 4/5 grafts patent (LIMA to LAD atretic and occluded mid vessel but adequate flow to distal LAD from collaterals / diagonal  . Low back pain   . PAC (premature atrial contraction)   . PAF (paroxysmal atrial  fibrillation) ([place])    initially diagnosed during stress test 2002  . Peyronie's disease     Past Surgical History:  Procedure Laterality Date  . ABLATION  05-06-2014   PVI and CTI ablation by Dr [name]  . ATRIAL FIBRILLATION ABLATION N/A 05/06/2014   Procedure: ATRIAL FIBRILLATION ABLATION;  Surgeon: [name], MD;  Location: [place] CATH LAB;  Service: Cardiovascular;  Laterality: N/A;  . CARDIAC CATHETERIZATION  09/14/2001    Mildly decreased left ventricular systolic function --  Native three vessel coronary artery disease as described. -- Status post coronary artery bypass grafting  . CORONARY ARTERY BYPASS GRAFT  2000   LIMA to LAD, SVG to second diagonal, seq SVG to ramus and OM, SVG to PDA  . HERNIA REPAIR  05/16/2001   Large left indirect inguinal hernia with right direct hernia.  Marland Kitchen HERNIA REPAIR     Recurrent left inguinal hernia -- Large left indirect inguinal hernia with right direct hernia.  Marland Kitchen LEFT HEART CATHETERIZATION WITH CORONARY/GRAFT ANGIOGRAM  11/26/2013   Procedure: LEFT HEART CATHETERIZATION WITH [name];  Surgeon: Peter M Martinique, MD;  Location: [facility] C[facility];  Service: Cardiovascular;;  . TEE WITHOUT CARDIOVERSION N/A 05/06/2014   Procedure: TRANSESOPHAGEAL ECHOCARDIOGRAM (TEE);  Surgeon: [name], MD;  Location: [facility] ENDOSCOPY;  Service: Cardiovascular;  Laterality: N/A;    Family History  Adopted: Yes    Social History   Tobacco Use  . Smoking status: Former Smoker    Packs/day: 1.00    Years: 20.00    Pack years: 20.00    Types: Cigarettes    Quit date: 10/31/1994    Years since quitting: 25.3  . Smokeless tobacco: Never Used  Substance Use Topics  . Alcohol use: No    Alcohol/week: 0.0 standard drinks    Subjective:  Complaining of chronic low back pain; "This has been my entire adult life." Has seen back specialists in the past but was told that not a surgical candidate; Has done some research and would like to try Cymbalta to help  with pain; notes that last saw orthopedist about his back 15+ years ago;  Requesting refill on his Imitrex- patient is adamant that he does not have migraine headaches but notes that pain does respond to medication; he feels there is a fluid issue in his brain causing the headaches; headaches were much worse when he was off Entresto; notes he saw a headache specialist 3-4 years ago with limited benefit and opted not to go back; Requesting updated sinus CT as well to evaluate for chronic sinus issues;     Objective:  Vitals:   03/16/20 1507  BP: 120/74  Pulse: 66  Temp: 98.2 F (36.8 C)  TempSrc: Oral  SpO2: 98%  Weight: 198 lb (89.8 kg)  Height: 6' 0.5" (  1.842 m)    General: Well developed, well nourished, in no acute distress  Skin : Warm and dry.  Head: Normocephalic and atraumatic  Lungs: Respirations unlabored; Musculoskeletal: No deformities; no active joint inflammation  Extremities: No edema, cyanosis, clubbing  Vessels: Symmetric bilaterally  Neurologic: Alert and oriented; speech intact; face symmetrical; moves all extremities well; CNII-XII intact without focal deficit  Assessment:  1. Chronic low back pain without sciatica, unspecified back pain laterality   2. Migraine without aura and with status migrainosus, not intractable   3. Chronic sinusitis, unspecified location     Plan:  1. Update lumbar X-ray; may need to consider MRI; trial of Cymbalta is appropriate- he will call back with his response.  2. Refill given on Imitrex; asked patient to consider neurology referral- he will consider at later date; 3. Update sinus CT ( if approved by insurance); will most likely need to go back to ENT.   This visit occurred during the SARS-CoV-2 public health emergency.  Safety protocols were in place, including screening questions prior to the visit, additional usage of staff PPE, and extensive cleaning of exam room while observing appropriate contact time as indicated for  disinfecting solutions.     No follow-ups on file.  Orders Placed This Encounter  Procedures  . DG Lumbar Spine Complete    Standing Status:   Future    Number of Occurrences:   1    Standing Expiration Date:   05/16/2021    Order Specific Question:   Reason for Exam (SYMPTOM  OR DIAGNOSIS REQUIRED)    Answer:   low back pain    Order Specific Question:   Preferred imaging location?    Answer:   [facility]    Order Specific Question:   Radiology Contrast Protocol - do NOT remove file path    Answer:   \\[email or web address]  . CT Maxillofacial WO CM    Standing Status:   Future    Standing Expiration Date:   06/16/2021    Order Specific Question:   ** REASON FOR EXAM (FREE TEXT)    Answer:   chronic sinusitis    Order Specific Question:   Preferred imaging location?    Answer:   [id number]    Order Specific Question:   Radiology Contrast Protocol - do NOT remove file path    Answer:   \\[email or web address]    Requested Prescriptions   Signed Prescriptions Disp Refills  . SUMAtriptan (IMITREX) 100 MG tablet 30 tablet 1    Sig: TAKE 1 TABLET AT ONSET OF MIGRAINE- MAY REPEAT ONCE IN 2 HOURS. LIMIT2/24 HOURS. AVOID DAILY USE.  . DULoxetine (CYMBALTA) 30 MG capsule 30 capsule 1    Sig: Take 1 capsule (30 mg total) by mouth daily.

## 2020-03-24 DIAGNOSIS — H2511 Age-related nuclear cataract, right eye: Secondary | ICD-10-CM | POA: Diagnosis not present

## 2020-03-25 ENCOUNTER — Encounter: Payer: Self-pay | Admitting: Internal Medicine

## 2020-03-25 ENCOUNTER — Ambulatory Visit (INDEPENDENT_AMBULATORY_CARE_PROVIDER_SITE_OTHER): Payer: Medicare Other | Admitting: Internal Medicine

## 2020-03-25 ENCOUNTER — Other Ambulatory Visit: Payer: Self-pay

## 2020-03-25 VITALS — BP 126/78 | HR 78 | Ht 73.0 in | Wt 200.0 lb

## 2020-03-25 DIAGNOSIS — I110 Hypertensive heart disease with heart failure: Secondary | ICD-10-CM | POA: Diagnosis not present

## 2020-03-25 DIAGNOSIS — I5022 Chronic systolic (congestive) heart failure: Secondary | ICD-10-CM

## 2020-03-25 DIAGNOSIS — I480 Paroxysmal atrial fibrillation: Secondary | ICD-10-CM

## 2020-03-25 MED ORDER — SPIRONOLACTONE 25 MG PO TABS
25.0000 mg | ORAL_TABLET | Freq: Every day | ORAL | 3 refills | Status: DC
Start: 2020-03-25 — End: 2020-04-06

## 2020-03-25 NOTE — Patient Instructions (Addendum)
Medication Instructions:  Your physician has recommended you make the following change in your medication:   1.  Start taking spironolactone 25 mg-  Take one tablet by mouth daily   Labwork: None ordered.  Testing/Procedures: None ordered.  Follow-Up: Your physician wants you to follow-up in: 2 months with Dr. Martinique at the [facility] office.  May 26, 2020 at 1:20 PM   Any Other Special Instructions Will Be Listed Below (If Applicable).  If you need a refill on your cardiac medications before your next appointment, please call your pharmacy.   Spironolactone Oral Tablets What is this medicine? SPIRONOLACTONE (speer on [place] LAK tone) is a diuretic. It helps you make more urine and to lose excess water from your body. This medicine is used to treat high blood pressure, and edema or swelling from heart, kidney, or liver disease. It is also used to treat patients who make too much aldosterone or have low potassium. This medicine may be used for other purposes; ask your health care provider or pharmacist if you have questions. COMMON BRAND NAME(S): Aldactone What should I tell my health care provider before I take this medicine? They need to know if you have any of these conditions:  high blood level of potassium  kidney disease or trouble making urine  liver disease  an unusual or allergic reaction to spironolactone, other medicines, foods, dyes, or preservatives  pregnant or trying to get pregnant  breast-feeding How should I use this medicine? Take this drug by mouth. Take it as directed on the prescription label at the same time every day. You can take it with or without food. You should always take it the same way. Keep taking it unless your health care provider tells you to stop. Talk to your health care provider about the use of this drug in children. Special care may be needed. Overdosage: If you think you have taken too much of this medicine contact a poison control  center or emergency room at once. NOTE: This medicine is only for you. Do not share this medicine with others. What if I miss a dose? If you miss a dose, take it as soon as you can. If it is almost time for your next dose, take only that dose. Do not take double or extra doses. What may interact with this medicine? Do not take this medicine with any of the following medications:  cidofovir  eplerenone  tranylcypromine This medicine may also interact with the following medications:  aspirin  certain medicines for blood pressure or heart disease like benazepril, lisinopril, losartan, valsartan  certain medicines that treat or prevent blood clots like heparin and enoxaparin  cholestyramine  cyclosporine  digoxin  lithium  medicines that relax muscles for surgery  NSAIDs, medicines for pain and inflammation, like ibuprofen or naproxen  other diuretics  potassium supplements  steroid medicines like prednisone or cortisone  trimethoprim This list may not describe all possible interactions. Give your health care provider a list of all the medicines, herbs, non-prescription drugs, or dietary supplements you use. Also tell them if you smoke, drink alcohol, or use illegal drugs. Some items may interact with your medicine. What should I watch for while using this medicine? Visit your doctor or health care professional for regular checks on your progress. Check your blood pressure as directed. Ask your doctor what your blood pressure should be, and when you should contact them. You may need to be on a special diet while taking this medicine. Ask  your doctor. Also, ask how many glasses of fluid you need to drink a day. You must not get dehydrated. This medicine may make you feel confused, dizzy or lightheaded. Drinking alcohol and taking some medicines can make this worse. Do not drive, use machinery, or do anything that needs mental alertness until you know how this medicine affects  you. Do not sit or stand up quickly. What side effects may I notice from receiving this medicine? Side effects that you should report to your doctor or health care professional as soon as possible:  allergic reactions such as skin rash or itching, hives, swelling of the lips, mouth, tongue, or throat  black or tarry stools  fast, irregular heartbeat  fever  muscle pain, cramps  numbness, tingling in hands or feet  trouble breathing  trouble passing urine  unusual bleeding  unusually weak or tired Side effects that usually do not require medical attention (report to your doctor or health care professional if they continue or are bothersome):  change in voice or hair growth  confusion  dizzy, drowsy  dry mouth, increased thirst  enlarged or tender breasts  headache  irregular menstrual periods  sexual difficulty, unable to have an erection  stomach upset This list may not describe all possible side effects. Call your doctor for medical advice about side effects. You may report side effects to FDA at 1-800-FDA-1088. Where should I keep my medicine? Keep out of the reach of children and pets. Store at room temperature between 20 and 25 degrees C (68 and 77 degrees F). Throw away any unused drug after the expiration date. NOTE: This sheet is a summary. It may not cover all possible information. If you have questions about this medicine, talk to your doctor, pharmacist, or health care provider.  2020 Elsevier/Gold Standard (2019-06-11 12:38:34)

## 2020-03-25 NOTE — Progress Notes (Signed)
PCP: [name], MD Primary Cardiologist: Dr Martinique Primary EP: Dr Tyson Dense is a 69 y.o. male who presents today for routine electrophysiology followup.  I have not seen him in several years.  He is referred by Dr Doug Sou team for further evaluation of his CHF and afib.  He says he has done well since his AF ablation.  He has had some palpitations, though appears to primarily be in sinus rhythm.  Since last being seen in our clinic, the patient reports doing reasonably well.  He is hoping to continue to fly.  Echo 03/16/20 reveals EF of 25% (reviewed).  He has SOB with moderate activity. Today, he denies symptoms of chest pain,  lower extremity edema, dizziness, presyncope, or syncope.  The patient is otherwise without complaint today.   Past Medical History:  Diagnosis Date  . Atrial flutter ([place])   . CHF (congestive heart failure) (HCC)    LVEF at time of cath 2002 50%; now has been 35-40% since 2008 (patient has seen multiple cardiologists over the years including [facility], [place], [facility], [facility] and Tollie Eth, MD)  . Coronary artery disease    CABG x5 in 2000; s/p cath 2002 > 4/5 grafts patent, LVEF 50%  . Degeneration of lumbar intervertebral disc   . GERD (gastroesophageal reflux disease)   . GI bleed    duodenal ulcer  . Hypertension    Borderline  . Ischemic heart disease    s/p MI, CABG 2000, s/p cath 2002 with 4/5 grafts patent (LIMA to LAD atretic and occluded mid vessel but adequate flow to distal LAD from collaterals / diagonal  . Low back pain   . PAC (premature atrial contraction)   . PAF (paroxysmal atrial fibrillation) ([place])    initially diagnosed during stress test 2002  . Peyronie's disease    Past Surgical History:  Procedure Laterality Date  . ABLATION  05-06-2014   PVI and CTI ablation by Dr [name]  . ATRIAL FIBRILLATION ABLATION N/A 05/06/2014   Procedure: ATRIAL FIBRILLATION ABLATION;  Surgeon:  [name], MD;  Location: [place] CATH LAB;  Service: Cardiovascular;  Laterality: N/A;  . CARDIAC CATHETERIZATION  09/14/2001    Mildly decreased left ventricular systolic function --  Native three vessel coronary artery disease as described. -- Status post coronary artery bypass grafting  . CORONARY ARTERY BYPASS GRAFT  2000   LIMA to LAD, SVG to second diagonal, seq SVG to ramus and OM, SVG to PDA  . HERNIA REPAIR  05/16/2001   Large left indirect inguinal hernia with right direct hernia.  Marland Kitchen HERNIA REPAIR     Recurrent left inguinal hernia -- Large left indirect inguinal hernia with right direct hernia.  Marland Kitchen LEFT HEART CATHETERIZATION WITH CORONARY/GRAFT ANGIOGRAM  11/26/2013   Procedure: LEFT HEART CATHETERIZATION WITH [name];  Surgeon: Peter M Martinique, MD;  Location: [facility] C[facility] LAB;  Service: Cardiovascular;;  . TEE WITHOUT CARDIOVERSION N/A 05/06/2014   Procedure: TRANSESOPHAGEAL ECHOCARDIOGRAM (TEE);  Surgeon: [name], MD;  Location: [facility] ENDOSCOPY;  Service: Cardiovascular;  Laterality: N/A;    ROS- all systems are reviewed and negatives except as per HPI above  Current Outpatient Medications  Medication Sig Dispense Refill  . Ascorbic Acid (VITAMIN C) 1000 MG tablet Take 1,000 mg by mouth daily.    Marland Kitchen b complex vitamins tablet Take 1 tablet by mouth daily.    . carvedilol (COREG) 12.5 MG tablet Take 1.5 tablets (18.75  mg total) by mouth 2 (two) times daily with a meal. 270 tablet 3  . celecoxib (CELEBREX) 200 MG capsule Take 200 mg by mouth as needed.     . cholecalciferol (VITAMIN D3) 25 MCG (1000 UNIT) tablet Take 1,000 Units by mouth daily.    Marland Kitchen co-enzyme Q-10 30 MG capsule Take 30 mg by mouth daily.    Marland Kitchen dicyclomine (BENTYL) 10 MG capsule Take 1 capsule (10 mg total) by mouth 3 (three) times daily before meals. 270 capsule 1  . dicyclomine (BENTYL) 20 MG tablet Take 20 mg by mouth 4 (four) times daily.    . DULoxetine (CYMBALTA) 30 MG capsule Take 1 capsule (30  mg total) by mouth daily. 30 capsule 1  . LOTEMAX SM 0.38 % GEL     . montelukast (SINGULAIR) 10 MG tablet Take 1 tablet (10 mg total) by mouth at bedtime. 30 tablet 5  . niacin 500 MG tablet Take 500 mg by mouth at bedtime.    Marland Kitchen omeprazole (PRILOSEC) 10 MG capsule Take 1 capsule (10 mg total) by mouth daily. 30 capsule 1  . Probiotic Product (PROBIOTIC-10 PO) Take 1 capsule by mouth daily.     Marland Kitchen PROLENSA 0.07 % SOLN     . sacubitril-valsartan (ENTRESTO) 24-26 MG Take 1 tablet by mouth 2 (two) times daily. 180 tablet 3  . SUMAtriptan (IMITREX) 100 MG tablet TAKE 1 TABLET AT ONSET OF MIGRAINE- MAY REPEAT ONCE IN 2 HOURS. LIMIT2/24 HOURS. AVOID DAILY USE. 30 tablet 1  . TURMERIC PO Take 1 tablet by mouth daily.    . vitamin E 200 UNIT capsule Take 200 Units by mouth daily.     No current facility-administered medications for this visit.    Physical Exam: Vitals:   03/25/20 1011  BP: 126/78  Pulse: 78  SpO2: 97%  Weight: 200 lb (90.7 kg)  Height: 6\' 1"  (1.854 m)    GEN- The patient is well appearing, alert and oriented x 3 today.   Head- normocephalic, atraumatic Eyes-  Sclera clear, conjunctiva pink Ears- hearing intact Oropharynx- clear Lungs- Clear to ausculation bilaterally, normal work of breathing Heart- Regular rate and rhythm, no murmurs, rubs or gallops, PMI not laterally displaced GI- soft, NT, ND, + BS Extremities- no clubbing, cyanosis, or edema  Wt Readings from Last 3 Encounters:  03/25/20 200 lb (90.7 kg)  03/16/20 198 lb (89.8 kg)  02/05/20 193 lb (87.5 kg)    EKG tracing ordered today is personally reviewed and shows sinus rhythm 78 bpm, PR 194 msec, QRS 114 msec, QTc 401 msec, PVCs  Assessment and Plan:  1. Ischemic CM/ chronic systolic dysfunction/ CAD No ischemic symptoms The patient has EF of 25% with NYHA Class II CHF, and CAD. At this time, he meets MADIT II/ SCD-HeFT criteria for ICD implantation for primary prevention of sudden death.  He has a  narrow QRS and therefore does not meet criteria for CRT.  I have had a thorough discussion with the patient reviewing options.  I have advised ICD implantation.  The patient has had opportunities to ask questions and have them answered. The patient is very clear that he does not wish to have an ICD implanted at this time.  I think that this is largely due to his wishes to continue to fly.  I have cautioned that it may not be best for him to continue to fly with his ischemic CM and palpitations. He is interested in further medicine to promote improvement  in his EF. I have therefore started spironolactone 25mg  daily today.  He can follow-up with Dr Martinique for further medicine titration.  2. afib  He has done very well s/p ablation by me. He has palpitations of unclear etiology.  There have been conversations about an implantable loop recorder.  As he meets criteria for an ICD, I do not think that there would be benefit from ILR implant at this time.  His chads2vasc score is at least 4.  Ideally, he should be anticoagulated, though he has declined.  3. Hypertensive cardiovascular disease with CHF Spironolactone added today.  He prefers to see me as needed and to follow-up with Dr Martinique.  Risks, benefits and potential toxicities for medications prescribed and/or refilled reviewed with patient today.   Thompson Grayer MD, [facility] 03/25/2020 10:45 AM

## 2020-03-31 ENCOUNTER — Other Ambulatory Visit: Payer: Self-pay

## 2020-03-31 ENCOUNTER — Ambulatory Visit
Admission: RE | Admit: 2020-03-31 | Discharge: 2020-03-31 | Disposition: A | Discharge status: Home / Self Care | Payer: Medicare Other | Source: Ambulatory Visit | Attending: Family | Admitting: Family

## 2020-03-31 DIAGNOSIS — J329 Chronic sinusitis, unspecified: Secondary | ICD-10-CM | POA: Diagnosis not present

## 2020-04-06 ENCOUNTER — Ambulatory Visit (INDEPENDENT_AMBULATORY_CARE_PROVIDER_SITE_OTHER): Payer: Medicare Other | Admitting: Internal Medicine

## 2020-04-06 ENCOUNTER — Ambulatory Visit: Payer: Medicare Other | Admitting: Family

## 2020-04-06 ENCOUNTER — Other Ambulatory Visit: Payer: Self-pay

## 2020-04-06 ENCOUNTER — Encounter: Payer: Self-pay | Admitting: Internal Medicine

## 2020-04-06 VITALS — BP 132/82 | HR 76 | Temp 98.5°F | Ht 73.0 in | Wt 197.0 lb

## 2020-04-06 DIAGNOSIS — M5136 Other intervertebral disc degeneration, lumbar region: Secondary | ICD-10-CM | POA: Insufficient documentation

## 2020-04-06 DIAGNOSIS — G43001 Migraine without aura, not intractable, with status migrainosus: Secondary | ICD-10-CM

## 2020-04-06 DIAGNOSIS — I2510 Atherosclerotic heart disease of native coronary artery without angina pectoris: Secondary | ICD-10-CM

## 2020-04-06 DIAGNOSIS — M5416 Radiculopathy, lumbar region: Secondary | ICD-10-CM

## 2020-04-06 DIAGNOSIS — M48061 Spinal stenosis, lumbar region without neurogenic claudication: Secondary | ICD-10-CM | POA: Insufficient documentation

## 2020-04-06 DIAGNOSIS — M51369 Other intervertebral disc degeneration, lumbar region without mention of lumbar back pain or lower extremity pain: Secondary | ICD-10-CM | POA: Insufficient documentation

## 2020-04-06 MED ORDER — TRAMADOL HCL 50 MG PO TABS: 50.0000 mg | ORAL_TABLET | Freq: Four times a day (QID) | ORAL | 1 refills | Status: AC | PRN

## 2020-04-06 MED ORDER — SUMATRIPTAN SUCCINATE 100 MG PO TABS: ORAL_TABLET | ORAL | 1 refills | Status: DC

## 2020-04-06 MED ORDER — METHYLPREDNISOLONE 4 MG PO TBPK: ORAL_TABLET | ORAL | 0 refills | Status: AC

## 2020-04-06 NOTE — Progress Notes (Signed)
Subjective:  Patient ID: Keith Vazquez, male    DOB: Jun 16, 1951  Age: 69 y.o. MRN: [id number]  CC: Back Pain  This visit occurred during the SARS-CoV-2 public health emergency.  Safety protocols were in place, including screening questions prior to the visit, additional usage of staff PPE, and extensive cleaning of exam room while observing appropriate contact time as indicated for disinfecting solutions.    HPI Keith Vazquez presents for follow-up on low back pain.  He has chronic low back pain.  About a week ago he feels like he exacerbated his low back pain by doing some exercises at the gym.  He recently had plain films done that showed degenerative disc disease with endplate spurring.  He says the back pain he has had for the last week is a combination of an aching and a sharp sensation that radiates into his left thigh.  He says the pain increases with standing and movement.  He denies paresthesias in his lower extremities.  He is not getting much symptom relief with Celebrex.  Outpatient Medications Prior to Visit  Medication Sig Dispense Refill   Ascorbic Acid (VITAMIN C) 1000 MG tablet Take 1,000 mg by mouth daily.     b complex vitamins tablet Take 1 tablet by mouth daily.     carvedilol (COREG) 12.5 MG tablet Take 1.5 tablets (18.75 mg total) by mouth 2 (two) times daily with a meal. 270 tablet 3   celecoxib (CELEBREX) 200 MG capsule Take 200 mg by mouth as needed.      cholecalciferol (VITAMIN D3) 25 MCG (1000 UNIT) tablet Take 1,000 Units by mouth daily.     co-enzyme Q-10 30 MG capsule Take 30 mg by mouth daily.     dicyclomine (BENTYL) 20 MG tablet Take 20 mg by mouth 4 (four) times daily.     LOTEMAX SM 0.38 % GEL      montelukast (SINGULAIR) 10 MG tablet Take 1 tablet (10 mg total) by mouth at bedtime. 30 tablet 5   niacin 500 MG tablet Take 500 mg by mouth at bedtime.     omeprazole (PRILOSEC) 10 MG capsule Take 1 capsule (10 mg total) by mouth daily. 30  capsule 1   Probiotic Product (PROBIOTIC-10 PO) Take 1 capsule by mouth daily.      PROLENSA 0.07 % SOLN      sacubitril-valsartan (ENTRESTO) 24-26 MG Take 1 tablet by mouth 2 (two) times daily. 180 tablet 3   TURMERIC PO Take 1 tablet by mouth daily.     vitamin E 200 UNIT capsule Take 200 Units by mouth daily.     SUMAtriptan (IMITREX) 100 MG tablet TAKE 1 TABLET AT ONSET OF MIGRAINE- MAY REPEAT ONCE IN 2 HOURS. LIMIT2/24 HOURS. AVOID DAILY USE. 30 tablet 1   dicyclomine (BENTYL) 10 MG capsule Take 1 capsule (10 mg total) by mouth 3 (three) times daily before meals. 270 capsule 1   DULoxetine (CYMBALTA) 30 MG capsule Take 1 capsule (30 mg total) by mouth daily. 30 capsule 1   spironolactone (ALDACTONE) 25 MG tablet Take 1 tablet (25 mg total) by mouth daily. 90 tablet 3   No facility-administered medications prior to visit.    ROS Review of Systems  Constitutional: Negative for appetite change, diaphoresis, fatigue and unexpected weight change.  Eyes: Negative.   Respiratory: Negative for cough, chest tightness, shortness of breath and wheezing.   Cardiovascular: Negative for chest pain and leg swelling.  Gastrointestinal: Negative for abdominal pain,  constipation, diarrhea, nausea and vomiting.  Endocrine: Negative.   Genitourinary: Negative.  Negative for difficulty urinating.  Musculoskeletal: Positive for back pain. Negative for myalgias.  Skin: Negative.  Negative for rash.  Neurological: Negative.  Negative for dizziness, weakness, light-headedness and numbness.  Hematological: Negative for adenopathy. Does not bruise/bleed easily.  Psychiatric/Behavioral: Negative.     Objective:  BP 132/82 (BP Location: Left Arm, Patient Position: Sitting, Cuff Size: Normal)    Pulse 76    Temp 98.5 F (36.9 C) (Oral)    Ht 6\' 1"  (1.854 m)    Wt 197 lb (89.4 kg)    SpO2 98%    BMI 25.99 kg/m   BP Readings from Last 3 Encounters:  04/06/20 132/82  03/25/20 126/78  03/16/20  120/74    Wt Readings from Last 3 Encounters:  04/06/20 197 lb (89.4 kg)  03/25/20 200 lb (90.7 kg)  03/16/20 198 lb (89.8 kg)    Physical Exam Vitals reviewed.  Constitutional:      Appearance: Normal appearance.  HENT:     Nose: Nose normal.     Mouth/Throat:     Mouth: Mucous membranes are moist.  Eyes:     General: No scleral icterus.    Conjunctiva/sclera: Conjunctivae normal.  Cardiovascular:     Rate and Rhythm: Normal rate and regular rhythm.     Heart sounds: No murmur.  Pulmonary:     Effort: Pulmonary effort is normal.     Breath sounds: No stridor. No wheezing, rhonchi or rales.  Abdominal:     General: Abdomen is flat. Bowel sounds are normal. There is no distension.     Palpations: There is no hepatomegaly or splenomegaly.  Musculoskeletal:     Cervical back: Normal and neck supple.     Thoracic back: Normal.     Lumbar back: Normal. No deformity, tenderness or bony tenderness. Normal range of motion. Negative right straight leg raise test and negative left straight leg raise test.  Lymphadenopathy:     Cervical: No cervical adenopathy.  Neurological:     Mental Status: He is alert.     Cranial Nerves: Cranial nerves are intact.     Sensory: Sensation is intact.     Motor: Motor function is intact. No weakness.     Coordination: Coordination is intact.     Gait: Gait is intact. Gait normal.     Deep Tendon Reflexes: Reflexes normal.     Reflex Scores:      Tricep reflexes are 0 on the right side and 0 on the left side.      Bicep reflexes are 0 on the right side and 0 on the left side.      Brachioradialis reflexes are 0 on the right side and 0 on the left side.      Patellar reflexes are 0 on the right side and 0 on the left side.      Achilles reflexes are 0 on the right side and 0 on the left side.    Lab Results  Component Value Date   WBC 6.5 01/09/2019   HGB 15.5 01/09/2019   HCT 45.4 01/09/2019   PLT 162.0 01/09/2019   GLUCOSE 76  01/09/2019   CHOL 166 01/09/2019   TRIG 167.0 (H) 01/09/2019   HDL 44.00 01/09/2019   LDLDIRECT 120.5 09/30/2013   LDLCALC 88 01/09/2019   ALT 6 01/09/2019   AST 19 01/09/2019   NA 139 01/09/2019   K 4.3 01/09/2019  CL 105 01/09/2019   CREATININE 0.96 01/09/2019   BUN 19 01/09/2019   CO2 28 01/09/2019   TSH 1.31 11/30/2016   PSA 0.76 03/15/2018   INR 1.00 11/26/2013    CT Maxillofacial WO CM  Result Date: 04/01/2020 CLINICAL DATA:  Chronic sinusitis EXAM: CT MAXILLOFACIAL WITHOUT CONTRAST TECHNIQUE: Multidetector CT imaging of the maxillofacial structures was performed. Multiplanar CT image reconstructions were also generated. COMPARISON:  None. FINDINGS: Osseous: No fracture or mandibular dislocation. No destructive process. Orbits: Negative. No traumatic or inflammatory finding. Sinuses: Clear. No mucosal thickening, air-fluid levels, or bony change of the sinus walls. The ostiomeatal complexes and frontal recesses are patent. Soft tissues: Negative. Limited intracranial: No significant or unexpected finding. IMPRESSION: No CT evidence of chronic or acute sinusitis. Normal sinus aeration. The ostiomeatal complexes and frontal recesses are patent. Electronically Signed   By: Eddie Candle M.D.   On: 04/01/2020 08:57    FINDINGS: Vertebral body heights and alignment are maintained. There is multilevel disc space narrowing, greatest at L5-S1. Small endplate osteophytes are present. There is facet hypertrophy throughout. Calcified plaque is present the aorta. Sacroiliac joints are unremarkable.  IMPRESSION: Multilevel lumbar spondylosis.   Assessment & Plan:   Mrk was seen today for back pain.  Diagnoses and all orders for this visit:  Left lumbar radiculitis- He has radiating low back pain but appears to be neurologically intact.  Plain film is concerning for spinal stenosis.  I recommended that he undergo an MRI of the lumbar spine to see if he would be a candidate for  surgical intervention.  In the meantime, will treat this exacerbation of pain with a 6-day course of methylprednisolone and tramadol as needed.  I have asked him to continue the current dose of celecoxib. -     MR Lumbar Spine Wo Contrast; Future -     methylPREDNISolone (MEDROL DOSEPAK) 4 MG TBPK tablet; TAKE AS DIRECTED -     traMADol (ULTRAM) 50 MG tablet; Take 1 tablet (50 mg total) by mouth every 6 (six) hours as needed for up to 5 days.  Migraine without aura and with status migrainosus, not intractable -     SUMAtriptan (IMITREX) 100 MG tablet; TAKE 1 TABLET AT ONSET OF MIGRAINE- MAY REPEAT ONCE IN 2 HOURS. LIMIT2/24 HOURS. AVOID DAILY USE.  DDD (degenerative disc disease), lumbar -     methylPREDNISolone (MEDROL DOSEPAK) 4 MG TBPK tablet; TAKE AS DIRECTED -     traMADol (ULTRAM) 50 MG tablet; Take 1 tablet (50 mg total) by mouth every 6 (six) hours as needed for up to 5 days.   I have discontinued Keith Vazquez. Keith Vazquez DULoxetine and spironolactone. I am also having him start on methylPREDNISolone and traMADol. Additionally, I am having him maintain his vitamin E, montelukast, omeprazole, celecoxib, Probiotic Product (PROBIOTIC-10 PO), vitamin C, b complex vitamins, cholecalciferol, TURMERIC PO, co-enzyme Q-10, niacin, Entresto, carvedilol, Lotemax SM, dicyclomine, Prolensa, and SUMAtriptan.  Meds ordered this encounter  Medications   SUMAtriptan (IMITREX) 100 MG tablet    Sig: TAKE 1 TABLET AT ONSET OF MIGRAINE- MAY REPEAT ONCE IN 2 HOURS. LIMIT2/24 HOURS. AVOID DAILY USE.    Dispense:  30 tablet    Refill:  1   methylPREDNISolone (MEDROL DOSEPAK) 4 MG TBPK tablet    Sig: TAKE AS DIRECTED    Dispense:  21 tablet    Refill:  0   traMADol (ULTRAM) 50 MG tablet    Sig: Take 1 tablet (50 mg total) by  mouth every 6 (six) hours as needed for up to 5 days.    Dispense:  75 tablet    Refill:  1    Follow-up: Return in about 4 weeks (around 05/04/2020).  Scarlette Calico, MD

## 2020-04-06 NOTE — Patient Instructions (Signed)
Acute Back Pain, Adult Acute back pain is sudden and usually short-lived. It is often caused by an injury to the muscles and tissues in the back. The injury may result from:  A muscle or ligament getting overstretched or torn (strained). Ligaments are tissues that connect bones to each other. Lifting something improperly can cause a back strain.  Wear and tear (degeneration) of the spinal disks. Spinal disks are circular tissue that provides cushioning between the bones of the spine (vertebrae).  Twisting motions, such as while playing sports or doing yard work.  A hit to the back.  Arthritis. You may have a physical exam, lab tests, and imaging tests to find the cause of your pain. Acute back pain usually goes away with rest and home care. Follow these instructions at home: Managing pain, stiffness, and swelling  Take over-the-counter and prescription medicines only as told by your health care provider.  Your health care provider may recommend applying ice during the first 24-48 hours after your pain starts. To do this: ? Put ice in a plastic bag. ? Place a towel between your skin and the bag. ? Leave the ice on for 20 minutes, 2-3 times a day.  If directed, apply heat to the affected area as often as told by your health care provider. Use the heat source that your health care provider recommends, such as a moist heat pack or a heating pad. ? Place a towel between your skin and the heat source. ? Leave the heat on for 20-30 minutes. ? Remove the heat if your skin turns bright red. This is especially important if you are unable to feel pain, heat, or cold. You have a greater risk of getting burned. Activity   Do not stay in bed. Staying in bed for more than 1-2 days can delay your recovery.  Sit up and stand up straight. Avoid leaning forward when you sit, or hunching over when you stand. ? If you work at a desk, sit close to it so you do not need to lean over. Keep your chin tucked  in. Keep your neck drawn back, and keep your elbows bent at a right angle. Your arms should look like the letter "L." ? Sit high and close to the steering wheel when you drive. Add lower back (lumbar) support to your car seat, if needed.  Take short walks on even surfaces as soon as you are able. Try to increase the length of time you walk each day.  Do not sit, drive, or stand in one place for more than 30 minutes at a time. Sitting or standing for long periods of time can put stress on your back.  Do not drive or use heavy machinery while taking prescription pain medicine.  Use proper lifting techniques. When you bend and lift, use positions that put less stress on your back: ? Bend your knees. ? Keep the load close to your body. ? Avoid twisting.  Exercise regularly as told by your health care provider. Exercising helps your back heal faster and helps prevent back injuries by keeping muscles strong and flexible.  Work with a physical therapist to make a safe exercise program, as recommended by your health care provider. Do any exercises as told by your physical therapist. Lifestyle  Maintain a healthy weight. Extra weight puts stress on your back and makes it difficult to have good posture.  Avoid activities or situations that make you feel anxious or stressed. Stress and anxiety increase muscle   tension and can make back pain worse. Learn ways to manage anxiety and stress, such as through exercise. General instructions  Sleep on a firm mattress in a comfortable position. Try lying on your side with your knees slightly bent. If you lie on your back, put a pillow under your knees.  Follow your treatment plan as told by your health care provider. This may include: ? Cognitive or behavioral therapy. ? Acupuncture or massage therapy. ? Meditation or yoga. Contact a health care provider if:  You have pain that is not relieved with rest or medicine.  You have increasing pain going down  into your legs or buttocks.  Your pain does not improve after 2 weeks.  You have pain at night.  You lose weight without trying.  You have a fever or chills. Get help right away if:  You develop new bowel or bladder control problems.  You have unusual weakness or numbness in your arms or legs.  You develop nausea or vomiting.  You develop abdominal pain.  You feel faint. Summary  Acute back pain is sudden and usually short-lived.  Use proper lifting techniques. When you bend and lift, use positions that put less stress on your back.  Take over-the-counter and prescription medicines and apply heat or ice as directed by your health care provider. This information is not intended to replace advice given to you by your health care provider. Make sure you discuss any questions you have with your health care provider. Document Revised: 02/05/2019 Document Reviewed: 05/31/2017 [employer] Patient Education  2020 [employer]  

## 2020-04-09 DIAGNOSIS — M47816 Spondylosis without myelopathy or radiculopathy, lumbar region: Secondary | ICD-10-CM | POA: Diagnosis not present

## 2020-04-09 DIAGNOSIS — G894 Chronic pain syndrome: Secondary | ICD-10-CM | POA: Diagnosis not present

## 2020-04-09 DIAGNOSIS — Z79891 Long term (current) use of opiate analgesic: Secondary | ICD-10-CM | POA: Diagnosis not present

## 2020-05-06 ENCOUNTER — Ambulatory Visit
Admission: RE | Admit: 2020-05-06 | Discharge: 2020-05-06 | Disposition: A | Discharge status: Home / Self Care | Payer: Medicare Other | Source: Ambulatory Visit | Attending: Internal Medicine | Admitting: Internal Medicine

## 2020-05-06 DIAGNOSIS — M5416 Radiculopathy, lumbar region: Secondary | ICD-10-CM

## 2020-05-15 ENCOUNTER — Encounter: Payer: Self-pay | Admitting: Internal Medicine

## 2020-05-18 ENCOUNTER — Other Ambulatory Visit: Payer: Self-pay | Admitting: Internal Medicine

## 2020-05-18 DIAGNOSIS — M48061 Spinal stenosis, lumbar region without neurogenic claudication: Secondary | ICD-10-CM

## 2020-05-21 ENCOUNTER — Encounter: Payer: Self-pay | Admitting: Physical Medicine and Rehabilitation

## 2020-05-23 NOTE — Progress Notes (Signed)
Cardiology Office Note    Date:  05/26/2020   ID:  Keith Vazquez, DOB 1951-10-07, MRN [id number]  PCP:  [name], MD  Cardiologist:  Amarie Viles Martinique, MD    History of Present Illness:  Keith Vazquez is a 69 y.o. male seen for follow up CAD and CHF.Last seen by me in 2018. He has been seen regularly by Rosaria Ferries PA-C.  He is post afib ablation 05/05/14.   He has an ischemic CM with chronic systolic CHF.   He is s/p CABG in 2000. Cardiac caths in 2002 and 2015 showed atretic LIMA to the LAD which filled by the SVG to the second diagonal. The SVG to the second diagonal, SVG sequentially to the ramus and OM, and the SVG to PDA were all patent. Last Myoview study in April 2016 was felt to be low risk. He had an ETT in June 2017 with no ischemia at 13 METs. He also has a history of HTN.  In January of 2018 he was started on Entresto- reported intolerance of higher doses. He is on Coreg.  Myoview in 2019 showed inferior scar without ischemia and EF 30%. Echo in May 2021 showed EF down to 25-30%. He was experiencing symptoms of early morning dyspnea and some palpitations. These episodes were rare only occurring 4x/year. [name] suggested he be considered for a Linq device. He was seen by Dr. [name]. No mention of a ILR but he did discuss consideration of ICD. Not a candidate for CRT due to narrow QRS. 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   The patient has since gotten an Apple watch. This does demonstrate episodes of Afib that correlate with his symptoms. HR 85. Baseline HR upper 50s in NSR. Patient is still very active going to the gym 3x/week. No limitations with dyspnea or chest pain. 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 Thinks his low EF is a "red herring" since he feels so well.       Past Medical History:  Diagnosis Date  . Atrial flutter ([place])   . CHF (congestive heart failure) (HCC)    LVEF  at time of cath 2002 50%; now has been 35-40% since 2008 (patient has seen multiple cardiologists over the years including [facility], [place], [facility], [facility] and Tollie Eth, MD)  . Coronary artery disease    CABG x5 in 2000; s/p cath 2002 > 4/5 grafts patent, LVEF 50%  . Degeneration of lumbar intervertebral disc   . GERD (gastroesophageal reflux disease)   . GI bleed    duodenal ulcer  . Hypertension    Borderline  . Ischemic heart disease    s/p MI, CABG 2000, s/p cath 2002 with 4/5 grafts patent (LIMA to LAD atretic and occluded mid vessel but adequate flow to distal LAD from collaterals / diagonal  . Low back pain   . PAC (premature atrial contraction)   . PAF (paroxysmal atrial fibrillation) ([place])    initially diagnosed during stress test 2002  . Peyronie's disease     Past Surgical History:  Procedure Laterality Date  . ABLATION  05-06-2014   PVI and CTI ablation by Dr [name]  . ATRIAL FIBRILLATION ABLATION N/A 05/06/2014   Procedure: ATRIAL FIBRILLATION ABLATION;  Surgeon: [name], MD;  Location: [facility] CATH LAB;  Service: Cardiovascular;  Laterality: N/A;  . CARDIAC CATHETERIZATION  09/14/2001  Mildly decreased left ventricular systolic function --  Native three vessel coronary artery disease as described. -- Status post coronary artery bypass grafting  . CORONARY ARTERY BYPASS GRAFT  2000   LIMA to LAD, SVG to second diagonal, seq SVG to ramus and OM, SVG to PDA  . HERNIA REPAIR  05/16/2001   Large left indirect inguinal hernia with right direct hernia.  Marland Kitchen HERNIA REPAIR     Recurrent left inguinal hernia -- Large left indirect inguinal hernia with right direct hernia.  Marland Kitchen LEFT HEART CATHETERIZATION WITH CORONARY/GRAFT ANGIOGRAM  11/26/2013   Procedure: LEFT HEART CATHETERIZATION WITH [name];  Surgeon: Gaylord Seydel M Martinique, MD;  Location[facility] CATH LAB;  Service: Cardiovascular;;  . TEE WITHOUT CARDIOVERSION N/A 05/06/2014    Procedure: TRANSESOPHAGEAL ECHOCARDIOGRAM (TEE);  Surgeon: [name], MD;  Location: [facility] ENDOSCOPY;  Service: Cardiovascular;  Laterality: N/A;    Current Medications: Outpatient Medications Prior to Visit  Medication Sig Dispense Refill  . Ascorbic Acid (VITAMIN C) 1000 MG tablet Take 1,000 mg by mouth daily.    Marland Kitchen b complex vitamins tablet Take 1 tablet by mouth daily.    . celecoxib (CELEBREX) 200 MG capsule Take 200 mg by mouth as needed.     . cholecalciferol (VITAMIN D3) 25 MCG (1000 UNIT) tablet Take 1,000 Units by mouth daily.    Marland Kitchen co-enzyme Q-10 30 MG capsule Take 30 mg by mouth daily.    Marland Kitchen dicyclomine (BENTYL) 20 MG tablet Take 20 mg by mouth 4 (four) times daily.    Marland Kitchen LOTEMAX SM 0.38 % GEL     . montelukast (SINGULAIR) 10 MG tablet Take 1 tablet (10 mg total) by mouth at bedtime. 30 tablet 5  . niacin 500 MG tablet Take 500 mg by mouth at bedtime.    Marland Kitchen omeprazole (PRILOSEC) 10 MG capsule Take 1 capsule (10 mg total) by mouth daily. 30 capsule 1  . Probiotic Product (PROBIOTIC-10 PO) Take 1 capsule by mouth daily.     Marland Kitchen PROLENSA 0.07 % SOLN     . SUMAtriptan (IMITREX) 100 MG tablet TAKE 1 TABLET AT ONSET OF MIGRAINE- MAY REPEAT ONCE IN 2 HOURS. LIMIT2/24 HOURS. AVOID DAILY USE. 30 tablet 1  . TURMERIC PO Take 1 tablet by mouth daily.    . vitamin E 200 UNIT capsule Take 200 Units by mouth daily.    . carvedilol (COREG) 12.5 MG tablet Take 1.5 tablets (18.75 mg total) by mouth 2 (two) times daily with a meal. 270 tablet 3  . sacubitril-valsartan (ENTRESTO) 24-26 MG Take 1 tablet by mouth 2 (two) times daily. 180 tablet 3   No facility-administered medications prior to visit.     Allergies:   Amiodarone, Oxycodone-acetaminophen, and Propoxyphene n-acetaminophen   Social History   Socioeconomic History  . Marital status: Single    Spouse name: Not on file  . Number of children: 0  . Years of education: Not on file  . Highest education level: Not on file  Occupational  History  . Occupation: garage Insurance claims handler  Tobacco Use  . Smoking status: Former Smoker    Packs/day: 1.00    Years: 20.00    Pack years: 20.00    Types: Cigarettes    Quit date: 10/31/1994    Years since quitting: 25.5  . Smokeless tobacco: Never Used  Vaping Use  . Vaping Use: Never used  Substance and Sexual Activity  . Alcohol use: No    Alcohol/week: 0.0 standard drinks  .  Drug use: No  . Sexual activity: Not on file  Other Topics Concern  . Not on file  Social History Narrative   Lives in [place].  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  Also flies airplanes   Social Determinants of Health   Financial Resource Strain:   . Difficulty of Paying Living Expenses:   Food Insecurity:   . Worried About Charity fundraiser in the Last Year:   . Arboriculturist in the Last Year:   Transportation Needs:   . Film/video editor (Medical):   Marland Kitchen Lack of Transportation (Non-Medical):   Physical Activity:   . Days of Exercise per Week:   . Minutes of Exercise per Session:   Stress: No Stress Concern Present  . Feeling of Stress : Only a little  Social Connections:   . Frequency of Communication with Friends and Family:   . Frequency of Social Gatherings with Friends and Family:   . Attends Religious Services:   . Active Member of Clubs or Organizations:   . Attends Archivist Meetings:   Marland Kitchen Marital Status:      Family History:  The patient's family history is not on file. He was adopted.   ROS:   Please see the history of present illness.    ROS All other systems reviewed and are negative.   PHYSICAL EXAM:   VS:  BP (!) 130/88   Pulse 76   Ht 6\' 1"  (1.854 m)   Wt 192 lb (87.1 kg)   SpO2 98%   BMI 25.33 kg/m    GEN: Well nourished, well developed, in no acute distress  HEENT: normal  Neck: no JVD, carotid bruits, or masses Cardiac: RRR; no murmurs, rubs, or gallops,no edema  Respiratory:  clear to auscultation bilaterally, normal  work of breathing GI: soft, nontender, nondistended, + BS MS: no deformity or atrophy  Skin: warm and dry, no rash Neuro:  Alert and Oriented x 3, Strength and sensation are intact Psych: euthymic mood, full affect  Wt Readings from Last 3 Encounters:  05/26/20 192 lb (87.1 kg)  04/06/20 197 lb (89.4 kg)  03/25/20 200 lb (90.7 kg)      Studies/Labs Reviewed:   EKG:  EKG isnot ordered today.  The ekg ordered today demonstrates N/A  Recent Labs: No results found for requested labs within last 8760 hours.   Lipid Panel    Component Value Date/Time   CHOL 166 01/09/2019 0957   CHOL 196 02/22/2018 1002   TRIG 167.0 (H) 01/09/2019 0957   HDL 44.00 01/09/2019 0957   HDL 44 02/22/2018 1002   CHOLHDL 4 01/09/2019 0957   VLDL 33.4 01/09/2019 0957   LDLCALC 88 01/09/2019 0957   LDLCALC 115 (H) 02/22/2018 1002   LDLDIRECT 120.5 09/30/2013 1400    Additional studies/ records that were reviewed today include:  ETT 04/21/16: Study Highlights    There was no ST segment deviation noted during stress.   Ambient PVC;s at rest, with stress and recovery No significant NSVT No ischemia at 13.2 METS and 102% of PMHR   Echo: 04/21/16: Study Conclusions  - Left ventricle: The cavity size was normal. Wall thickness was   normal. Basal to mid inferior akinesis. Basal to mid   inferolateral akinesis. Anterolateral hypokinesis. Systolic   function was mildly to moderately reduced. The estimated ejection   fraction was in the range of 40% to 45%. Doppler parameters are   consistent with abnormal left  ventricular relaxation (grade 1   diastolic dysfunction). - Aortic valve: There was no stenosis. - Aorta: Mildly dilated aortic root. Aortic root dimension: 38 mm   (ED). - Mitral valve: Mildly calcified annulus. There was trivial   regurgitation. - Left atrium: The atrium was mildly dilated. - Right ventricle: The cavity size was normal. Systolic function   was mildly reduced. -  Tricuspid valve: Peak RV-RA gradient (S): 16 mm Hg. - Pulmonary arteries: PA peak pressure: 19 mm Hg (S). - Inferior vena cava: The vessel was normal in size. The   respirophasic diameter changes were in the normal range (= 50%),   consistent with normal central venous pressure.  Impressions:  - Normal LV size with EF 40-45%. Wall motion abnormalities as noted   above. Normal RV size with mildly decreased systolic function. No   significant valvular abnormalities.  Myoview 11/10/17: Study Highlights    Nuclear stress EF: 30%.  The left ventricular ejection fraction is moderately decreased (30-44%).  Defect 1: There is a defect present in the basal inferolateral and mid inferolateral location.  Findings consistent with prior myocardial infarction.  This is a high risk study.   High risk stress nuclear study due to severely reduced global systolic function. There is evidence of an inferolateral scar (previously seen), but there is no reversible ischemia. There is global hypokinesis, worse in the inferolateral wall. LVEF has decreased slightly from 2016 (previously 36%), otherwise little change.  Echo 03/16/20: IMPRESSIONS    1. Global hypokinesis with akinesis of the inferobasal and inferolateral  walls; overall severe LV dysfunction; moderate LVE; mild LVH; grade 1  diastolic dysfunction; mildly dilated aortic root.  2. Left ventricular ejection fraction, by estimation, is 25 to 30%. The  left ventricle has severely decreased function. The left ventricle  demonstrates regional wall motion abnormalities (see scoring  diagram/findings for description). The left  ventricular internal cavity size was moderately dilated. There is mild  left ventricular hypertrophy. Left ventricular diastolic parameters are  consistent with Grade I diastolic dysfunction (impaired relaxation).  3. Right ventricular systolic function is normal. The right ventricular  size is normal.  4. The  mitral valve is normal in structure. Trivial mitral valve  regurgitation. No evidence of mitral stenosis.  5. The aortic valve is tricuspid. Aortic valve regurgitation is not  visualized. No aortic stenosis is present.  6. Aortic dilatation noted. There is mild dilatation of the aortic root  measuring 38 mm.   ASSESSMENT:    1. Chronic systolic heart failure (HCC)   2. Paroxysmal atrial fibrillation ([place])   3. Coronary artery disease involving native coronary artery of native heart without angina pectoris   4. Essential hypertension   5. Hyperlipidemia LDL goal <70      PLAN:   1.  CAD: s/p CABG 2000. Known atretic LIMA to LAD but other grafts patent by cath in 2015.  -He is on a beta-blocker - asymptomatic - defers taking ASA because concerned about bleeding risk.   2.  Chronic systolic CHF: EF 32-44% -He is on the carvedilol and Entresto, compliant with both. - I have recommended the addition of aldactone 25 mg daily -  He did not tolerate a higher dose of Entresto. - refused ICD - will update lab work including renal and potassium next week.   3.  Hypertension: -BP is well controlled.   4.  Hyperlipidemia: -need to update lab work - will do next week -He is not on a statin, but is  taking coenzyme Q 10 and niacin. -Target LDL is less than 70, he may need referral to the lipid clinic  5.  Paroxysmal Afib - I reviewed Apple watch monitor tracings and confirm he does have some AFib. Rate controlled on Coreg.  -fortunately symptoms are infrequent. No indication for AAD therapy or ablation at this time.  - [place] vasc score of 3- discussed recommendation for long term anticoagulation to reduce risk of embolic CVA. Patient states he does not want to take this due to concern about bleeding risk. Discussed risk of CVA vs bleeding.    Follow up in 4 months.    Medication Adjustments/Labs and Tests Ordered: Current medicines are reviewed at length with the patient today.   Concerns regarding medicines are outlined above.  Medication changes, Labs and Tests ordered today are listed in the Patient Instructions below. Patient Instructions  Go ahead and start aldactone 25 mg daily  We will check lab work next week.  Continue your other therapy   Follow up 4 months     Signed, Montravious Weigelt Martinique, MD ,[facility] 05/26/2020 3:19 PM    [place] [address] [place], [place], [address] [phone]

## 2020-05-26 ENCOUNTER — Other Ambulatory Visit: Payer: Self-pay

## 2020-05-26 ENCOUNTER — Encounter: Payer: Self-pay | Admitting: Cardiology

## 2020-05-26 ENCOUNTER — Ambulatory Visit (INDEPENDENT_AMBULATORY_CARE_PROVIDER_SITE_OTHER): Payer: Medicare Other | Admitting: Cardiology

## 2020-05-26 VITALS — BP 130/88 | HR 76 | Ht 73.0 in | Wt 192.0 lb

## 2020-05-26 DIAGNOSIS — E785 Hyperlipidemia, unspecified: Secondary | ICD-10-CM | POA: Diagnosis not present

## 2020-05-26 DIAGNOSIS — I2510 Atherosclerotic heart disease of native coronary artery without angina pectoris: Secondary | ICD-10-CM

## 2020-05-26 DIAGNOSIS — I5022 Chronic systolic (congestive) heart failure: Secondary | ICD-10-CM | POA: Diagnosis not present

## 2020-05-26 DIAGNOSIS — I480 Paroxysmal atrial fibrillation: Secondary | ICD-10-CM

## 2020-05-26 DIAGNOSIS — I10 Essential (primary) hypertension: Secondary | ICD-10-CM | POA: Diagnosis not present

## 2020-05-26 MED ORDER — ENTRESTO 24-26 MG PO TABS: 1.0000 | ORAL_TABLET | Freq: Two times a day (BID) | ORAL | 3 refills | Status: DC

## 2020-05-26 MED ORDER — SPIRONOLACTONE 25 MG PO TABS
25.0000 mg | ORAL_TABLET | Freq: Every day | ORAL | 3 refills | Status: DC
Start: 2020-05-26 — End: 2020-09-14

## 2020-05-26 MED ORDER — CARVEDILOL 12.5 MG PO TABS: 18.7500 mg | ORAL_TABLET | Freq: Two times a day (BID) | ORAL | 3 refills | Status: DC

## 2020-05-26 NOTE — Patient Instructions (Addendum)
Go ahead and start aldactone 25 mg daily  We will check lab work next week.  Continue your other therapy   Follow up 4 months

## 2020-06-08 ENCOUNTER — Telehealth: Payer: Medicare Other

## 2020-06-24 NOTE — Telephone Encounter (Signed)
Watch tracing does show Afib with controlled rate. I think we need to get a better idea of how much Afib he is having and relating this to symptoms. I would recommend a 30 day event monitor and then follow up with me after to discuss options.   Phyllip Claw Martinique MD, [facility]

## 2020-06-25 ENCOUNTER — Encounter: Payer: Self-pay | Admitting: Physical Medicine and Rehabilitation

## 2020-06-25 ENCOUNTER — Encounter
Payer: Medicare Other | Attending: Physical Medicine and Rehabilitation | Admitting: Physical Medicine and Rehabilitation

## 2020-06-25 ENCOUNTER — Other Ambulatory Visit: Payer: Self-pay

## 2020-06-25 VITALS — BP 122/82 | HR 68 | Temp 97.8°F | Ht 73.0 in | Wt 197.2 lb

## 2020-06-25 DIAGNOSIS — M48061 Spinal stenosis, lumbar region without neurogenic claudication: Secondary | ICD-10-CM | POA: Diagnosis not present

## 2020-06-25 NOTE — Progress Notes (Signed)
Subjective:    Patient ID: Keith Vazquez, male    DOB: 07/27/1951, 69 y.o.   MRN: [id number]  HPI  Keith Vazquez is a 69 year old man who presents to establish care for chronic low back pain.   He has had back aches since he was 69 years old. It is worse with standing. Celebrex helps a little bit. Walking very slowly causes pain. He sleeps on couch with legs elevated. His pain worsened after using the sitting machine 2 months ago. Prior to this the pain was on the right side, but then it moved to the left side. He has right sided face joint injection. Average pain is 5/10 and right now is 7/10. He would also like to see the results of his MRI.   He cannot take aspirin as he has had a bleeding ulcer. He has taken Tramadol and Norco and this has helped.   Pain Inventory Average Pain 5 Pain Right Now 7 My pain is constant, burning, dull and aching  In the last 24 hours, has pain interfered with the following? General activity 4 Relation with others 4 Enjoyment of life 4 What TIME of day is your pain at its worst? morning , daytime, evening, night and varies Sleep (in general) Fair  Pain is worse with: walking, bending, sitting, inactivity, standing, unsure, some activites and unsure of what makes it happen Pain improves with: heat/ice and therapy/exercise Relief from Meds: 7-8  walk without assistance ability to climb steps?  yes do you drive?  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  no  No problems in this area  New Patient  New Patient    Family History  Adopted: Yes   Social History   Socioeconomic History  . Marital status: Single    Spouse name: Not on file  . Number of children: 0  . Years of education: Not on file  . Highest education level: Not on file  Occupational History  . Occupation: garage Insurance claims handler  Tobacco Use  . Smoking status: Former Smoker    Packs/day:  1.00    Years: 20.00    Pack years: 20.00    Types: Cigarettes    Quit date: 10/31/1994    Years since quitting: 25.6  . Smokeless tobacco: Never Used  Vaping Use  . Vaping Use: Never used  Substance and Sexual Activity  . Alcohol use: No    Alcohol/week: 0.0 standard drinks  . Drug use: No  . Sexual activity: Yes  Other Topics Concern  . Not on file  Social History Narrative   Lives in [place].  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  Also flies airplanes   Social Determinants of Health   Financial Resource Strain:   . Difficulty of Paying Living Expenses: Not on file  Food Insecurity:   . Worried About Charity fundraiser in the Last Year: Not on file  . Ran Out of Food in the Last Year: Not on file  Transportation Needs:   . Lack of Transportation (Medical): Not on file  . Lack of Transportation (Non-Medical): Not on file  Physical Activity:   . Days of Exercise per Week: Not on file  . Minutes of Exercise per Session: Not on file  Stress: No Stress Concern Present  . Feeling of Stress : Only a little  Social Connections:   . Frequency  of Communication with Friends and Family: Not on file  . Frequency of Social Gatherings with Friends and Family: Not on file  . Attends Religious Services: Not on file  . Active Member of Clubs or Organizations: Not on file  . Attends Archivist Meetings: Not on file  . Marital Status: Not on file   Past Surgical History:  Procedure Laterality Date  . ABLATION  05-06-2014   PVI and CTI ablation by Dr [name]  . ATRIAL FIBRILLATION ABLATION N/A 05/06/2014   Procedure: ATRIAL FIBRILLATION ABLATION;  Surgeon: [name], MD;  Location: [place] CATH LAB;  Service: Cardiovascular;  Laterality: N/A;  . CARDIAC CATHETERIZATION  09/14/2001    Mildly decreased left ventricular systolic function --  Native three vessel coronary artery disease as described. -- Status post coronary artery bypass grafting  . CORONARY ARTERY BYPASS  GRAFT  2000   LIMA to LAD, SVG to second diagonal, seq SVG to ramus and OM, SVG to PDA  . HERNIA REPAIR  05/16/2001   Large left indirect inguinal hernia with right direct hernia.  Marland Kitchen HERNIA REPAIR     Recurrent left inguinal hernia -- Large left indirect inguinal hernia with right direct hernia.  Marland Kitchen LEFT HEART CATHETERIZATION WITH CORONARY/GRAFT ANGIOGRAM  11/26/2013   Procedure: LEFT HEART CATHETERIZATION WITH [name];  Surgeon: Peter M Martinique, MD;  Location: [facility] CATH LAB;  Service: Cardiovascular;;  . TEE WITHOUT CARDIOVERSION N/A 05/06/2014   Procedure: TRANSESOPHAGEAL ECHOCARDIOGRAM (TEE);  Surgeon: [name], MD;  Location: [facility] ENDOSCOPY;  Service: Cardiovascular;  Laterality: N/A;   Past Medical History:  Diagnosis Date  . Atrial flutter ([place])   . CHF (congestive heart failure) (HCC)    LVEF at time of cath 2002 50%; now has been 35-40% since 2008 (patient has seen multiple cardiologists over the years including [facility], [place], [facility], [facility] and Tollie Eth, MD)  . Coronary artery disease    CABG x5 in 2000; s/p cath 2002 > 4/5 grafts patent, LVEF 50%  . Degeneration of lumbar intervertebral disc   . GERD (gastroesophageal reflux disease)   . GI bleed    duodenal ulcer  . Hypertension    Borderline  . Ischemic heart disease    s/p MI, CABG 2000, s/p cath 2002 with 4/5 grafts patent (LIMA to LAD atretic and occluded mid vessel but adequate flow to distal LAD from collaterals / diagonal  . Low back pain   . PAC (premature atrial contraction)   . PAF (paroxysmal atrial fibrillation) ([place])    initially diagnosed during stress test 2002  . Peyronie's disease    BP 122/82   Pulse 68   Temp 97.8 F (36.6 C)   Ht 6\' 1"  (1.854 m)   Wt 197 lb 3.2 oz (89.4 kg)   SpO2 97%   BMI 26.02 kg/m   Opioid Risk Score:   Fall Risk Score:  `1  Depression screen PHQ 2/9  Depression screen [facility] 2/9 06/25/2020 05/16/2019 12/04/2016    Decreased Interest 0 0 0  Down, Depressed, Hopeless 0 0 0  PHQ - 2 Score 0 0 0  Altered sleeping 0 - -  Tired, decreased energy 0 - -  Change in appetite 0 - -  Feeling bad or failure about yourself  0 - -  Trouble concentrating 0 - -  Moving slowly or fidgety/restless 0 - -  Suicidal thoughts 0 - -  PHQ-9 Score 0 - -   Review  of Systems  Constitutional: Negative.   HENT: Negative.   Eyes: Negative.   Respiratory: Negative.   Cardiovascular: Negative.   Gastrointestinal: Negative.   Endocrine: Negative.   Genitourinary: Negative.   Musculoskeletal: Positive for back pain and gait problem.  Skin: Negative.   Allergic/Immunologic: Negative.   Hematological: Negative.   Psychiatric/Behavioral: Negative.   All other systems reviewed and are negative.      Objective:   Physical Exam Gen: no distress, normal appearing HEENT: oral mucosa pink and moist, NCAT Cardio: Reg rate Chest: normal effort, normal rate of breathing Abd: soft, non-distended Ext: no edema Skin: intact Neuro: Alert and oriented x3 Musculoskeletal: Tenderness to palpation Psych: pleasant, normal affect    Assessment & Plan:  Mr. Kirkendall is a 69 year old man who presents to establish care for bilateral low back pain secondary to foraminal stenosis.  -Reviewed MRI report and image with the patient. It is from 05/06/20 and shows right sided L2-L3 foraminal stenosis and bilateral L5-S1 disc degeneration and foraminal stenosis.   -He has tried physical therapy, medication, injection and is looking for a more permanent solution.   --Discussed [employer] PNS system as an option of pain treatment via neuromodulation. Provided following link for patient to learn more about the system: [email or web address] He is interested in this option. Discussed risks and benefits of procedure. Will schedule for next available appointment

## 2020-06-25 NOTE — Patient Instructions (Signed)
[email or web address]

## 2020-07-03 ENCOUNTER — Other Ambulatory Visit: Payer: Self-pay

## 2020-07-03 ENCOUNTER — Telehealth: Payer: Self-pay | Admitting: Radiology

## 2020-07-03 ENCOUNTER — Telehealth: Payer: Self-pay

## 2020-07-03 DIAGNOSIS — I480 Paroxysmal atrial fibrillation: Secondary | ICD-10-CM

## 2020-07-03 NOTE — Telephone Encounter (Signed)
Enrolled patient for a 30 day [employer] Event monitor to be mailed to patients home.  

## 2020-07-03 NOTE — Telephone Encounter (Signed)
Called patient left Dr.Jordan'a advice on personal voice mail.

## 2020-07-03 NOTE — Progress Notes (Signed)
Called patient left message on personal voice mail Dr.Jordan advised 30 day event monitor.Advised monitor will be mailed to your home.Tech will call to explain how to put on.Advised to keep appointment already scheduled with Dr.Jordan 11/15 at 1:40 pm.

## 2020-07-12 ENCOUNTER — Ambulatory Visit (INDEPENDENT_AMBULATORY_CARE_PROVIDER_SITE_OTHER): Payer: Medicare Other

## 2020-07-12 DIAGNOSIS — I480 Paroxysmal atrial fibrillation: Secondary | ICD-10-CM | POA: Diagnosis not present

## 2020-07-12 NOTE — Progress Notes (Signed)
Cardiology Office Note   Date:  07/14/2020   ID:  Keith Vazquez, DOB Feb 18, 1951, MRN [id number]  PCP:  [name], MD  Cardiologist:  Peter Martinique, MD EP: Thompson Grayer, MD  Chief Complaint  Patient presents with  . Atrial Fibrillation      History of Present Illness: Keith Vazquez is a 69 y.o. male with a PMH of CAD s/p CABG in 2000 (atretic LIMA to LAD filled by SVG to 2nd diagonal, otherwise patent grafts on cath in 2015), chronic combined CHF, paroxysmal atrial fibrillation s/p ablation in 2015 with recent reoccurrence, HTN, HLD, and GERD with hx of GI bleed who presents for follow-up of his atrial fibrillation.  He was last evaluated by cardiology at an outpatient visit with Dr. Martinique 04/2020, at which time he had recently gotten an apple watch and noted episodes of atrial fibrillation that correlated with symptoms of palpitations. Anticoagulation need was discussed at that visit, however patient again concerned about potential bleeding risk and refused anticoagulation despite CHA2DS2-VASc Score of 4 (CHF, HTN, Vascular, and Age 42-74). He had no anginal complaints at that visit and BP was stable, therefore no medication changes occurred. Prior to this he was seen by Dr. [name] who discussed ICD placement, however plan for ICD was deferred as patient wished to continue flying, though was advised against flying nonetheless due to his ischemic cardiomyopathy.    He contacted our office to report worsening symptoms in regards to his atrial fibrillation and was recommended to undergo a 30 day monitor to determine frequency/duration of his atrial fibrillation episodes.   He presents today feeling frustrated about his recent care. He feels like his atrial fibrillation concerns are not being addressed. He continues to have episodes once every 2-3 weeks which can last upwards of 12 hours at a time and leave him feeling weak, anxious, and somewhat short of breath. He is eager to  pursue an ablation but reports he has lost trust with Dr. [name] after his last visit where he felt his atrial fibrillation concerns were dismissed and discussion was focused on his CHF and need for an ICD. He feels like there is a negative outlook on his overall cardiac health despite his regular exercise. He has no complaints of chest pain, SOB, DOE, dizziness, lightheadedness, syncope, or LE edema. We discussed need for anticoagulation going forward if he wished to pursue an ablation. He is concerned about prolonged bleeding time as he works with his hands and cuts himself frequently. He has no complaints of hematuria, melena, or hematochezia.     Past Medical History:  Diagnosis Date  . Atrial flutter ([place])   . CHF (congestive heart failure) (HCC)    LVEF at time of cath 2002 50%; now has been 35-40% since 2008 (patient has seen multiple cardiologists over the years including [facility], [place], [facility], [facility] and Tollie Eth, MD)  . Coronary artery disease    CABG x5 in 2000; s/p cath 2002 > 4/5 grafts patent, LVEF 50%  . Degeneration of lumbar intervertebral disc   . GERD (gastroesophageal reflux disease)   . GI bleed    duodenal ulcer  . Hypertension    Borderline  . Ischemic heart disease    s/p MI, CABG 2000, s/p cath 2002 with 4/5 grafts patent (LIMA to LAD atretic and occluded mid vessel but adequate flow to distal LAD from collaterals / diagonal  . Low back pain   . PAC (premature  atrial contraction)   . PAF (paroxysmal atrial fibrillation) ([place])    initially diagnosed during stress test 2002  . Peyronie's disease     Past Surgical History:  Procedure Laterality Date  . ABLATION  05-06-2014   PVI and CTI ablation by Dr [name]  . ATRIAL FIBRILLATION ABLATION N/A 05/06/2014   Procedure: ATRIAL FIBRILLATION ABLATION;  Surgeon: [name], MD;  Location: [place] CATH LAB;  Service: Cardiovascular;  Laterality: N/A;  . CARDIAC CATHETERIZATION   09/14/2001    Mildly decreased left ventricular systolic function --  Native three vessel coronary artery disease as described. -- Status post coronary artery bypass grafting  . CORONARY ARTERY BYPASS GRAFT  2000   LIMA to LAD, SVG to second diagonal, seq SVG to ramus and OM, SVG to PDA  . HERNIA REPAIR  05/16/2001   Large left indirect inguinal hernia with right direct hernia.  Marland Kitchen HERNIA REPAIR     Recurrent left inguinal hernia -- Large left indirect inguinal hernia with right direct hernia.  Marland Kitchen LEFT HEART CATHETERIZATION WITH CORONARY/GRAFT ANGIOGRAM  11/26/2013   Procedure: LEFT HEART CATHETERIZATION WITH [name];  Surgeon: Peter M Martinique, MD;  Location: [facility] CATH LAB;  Service: Cardiovascular;;  . TEE WITHOUT CARDIOVERSION N/A 05/06/2014   Procedure: TRANSESOPHAGEAL ECHOCARDIOGRAM (TEE);  Surgeon: [name], MD;  Location: [facility] ENDOSCOPY;  Service: Cardiovascular;  Laterality: N/A;     Current Outpatient Medications  Medication Sig Dispense Refill  . Ascorbic Acid (VITAMIN C) 1000 MG tablet Take 1,000 mg by mouth daily.    Marland Kitchen b complex vitamins tablet Take 1 tablet by mouth daily.    . carvedilol (COREG) 12.5 MG tablet Take 1.5 tablets (18.75 mg total) by mouth 2 (two) times daily with a meal. 270 tablet 3  . celecoxib (CELEBREX) 200 MG capsule Take 200 mg by mouth as needed.     . cholecalciferol (VITAMIN D3) 25 MCG (1000 UNIT) tablet Take 1,000 Units by mouth daily.    Marland Kitchen co-enzyme Q-10 30 MG capsule Take 30 mg by mouth daily.    Marland Kitchen dicyclomine (BENTYL) 20 MG tablet Take 20 mg by mouth 4 (four) times daily.    Marland Kitchen HYDROcodone-acetaminophen (NORCO) 7.5-325 MG tablet     . LOTEMAX SM 0.38 % GEL     . montelukast (SINGULAIR) 10 MG tablet Take 1 tablet (10 mg total) by mouth at bedtime. 30 tablet 5  . niacin 500 MG tablet Take 500 mg by mouth at bedtime.    Marland Kitchen omeprazole (PRILOSEC) 10 MG capsule Take 1 capsule (10 mg total) by mouth daily. 30 capsule 1  . Probiotic Product  (PROBIOTIC-10 PO) Take 1 capsule by mouth daily.     Marland Kitchen PROLENSA 0.07 % SOLN     . sacubitril-valsartan (ENTRESTO) 24-26 MG Take 1 tablet by mouth 2 (two) times daily. 180 tablet 3  . spironolactone (ALDACTONE) 25 MG tablet Take 1 tablet (25 mg total) by mouth daily. 90 tablet 3  . SUMAtriptan (IMITREX) 100 MG tablet TAKE 1 TABLET AT ONSET OF MIGRAINE- MAY REPEAT ONCE IN 2 HOURS. LIMIT2/24 HOURS. AVOID DAILY USE. 30 tablet 1  . traMADol (ULTRAM) 50 MG tablet Take by mouth.    . TURMERIC PO Take 1 tablet by mouth daily.    . vitamin E 200 UNIT capsule Take 200 Units by mouth daily.    Marland Kitchen apixaban (ELIQUIS) 5 MG TABS tablet Take 1 tablet (5 mg total) by mouth 2 (two) times daily. 180 tablet 1  No current facility-administered medications for this visit.    Allergies:   Amiodarone, Oxycodone-acetaminophen, and Propoxyphene n-acetaminophen    Social History:  The patient  reports that he quit smoking about 25 years ago. His smoking use included cigarettes. He has a 20.00 pack-year smoking history. He has never used smokeless tobacco. He reports that he does not drink alcohol and does not use drugs.   Family History:  The patient's family history is not on file. He was adopted.    ROS:  Please see the history of present illness.   Otherwise, review of systems are positive for none.   All other systems are reviewed and negative.    PHYSICAL EXAM: VS:  BP 90/72   Pulse 69   Temp 98.2 F (36.8 C)   Ht 6\' 1"  (1.854 m)   Wt 197 lb 9.6 oz (89.6 kg)   SpO2 97%   BMI 26.07 kg/m  , BMI Body mass index is 26.07 kg/m. GEN: Well nourished, well developed, in no acute distress HEENT: sclera anicteric Neck: no JVD, carotid bruits, or masses Cardiac: RRR; no murmurs, rubs, or gallops, no edema  Respiratory:  clear to auscultation bilaterally, normal work of breathing GI: soft, nontender, nondistended, + BS MS: no deformity or atrophy Skin: warm and dry, no rash Neuro:  Strength and sensation  are intact Psych: euthymic mood, full affect   EKG:  EKG is ordered today. The ekg ordered today demonstrates sinus rhythm with 1st degree AV block, PVC, rate 69 bpm, no STE/D  Recent Labs: No results found for requested labs within last 8760 hours.    Lipid Panel    Component Value Date/Time   CHOL 166 01/09/2019 0957   CHOL 196 02/22/2018 1002   TRIG 167.0 (H) 01/09/2019 0957   HDL 44.00 01/09/2019 0957   HDL 44 02/22/2018 1002   CHOLHDL 4 01/09/2019 0957   VLDL 33.4 01/09/2019 0957   LDLCALC 88 01/09/2019 0957   LDLCALC 115 (H) 02/22/2018 1002   LDLDIRECT 120.5 09/30/2013 1400      Wt Readings from Last 3 Encounters:  07/14/20 197 lb 9.6 oz (89.6 kg)  06/25/20 197 lb 3.2 oz (89.4 kg)  05/26/20 192 lb (87.1 kg)      Other studies Reviewed: Additional studies/ records that were reviewed today include:   Echocardiogram 02/2020: 1. Global hypokinesis with akinesis of the inferobasal and inferolateral  walls; overall severe LV dysfunction; moderate LVE; mild LVH; grade 1  diastolic dysfunction; mildly dilated aortic root.  2. Left ventricular ejection fraction, by estimation, is 25 to 30%. The  left ventricle has severely decreased function. The left ventricle  demonstrates regional wall motion abnormalities (see scoring  diagram/findings for description). The left  ventricular internal cavity size was moderately dilated. There is mild  left ventricular hypertrophy. Left ventricular diastolic parameters are  consistent with Grade I diastolic dysfunction (impaired relaxation).  3. Right ventricular systolic function is normal. The right ventricular  size is normal.  4. The mitral valve is normal in structure. Trivial mitral valve  regurgitation. No evidence of mitral stenosis.  5. The aortic valve is tricuspid. Aortic valve regurgitation is not  visualized. No aortic stenosis is present.  6. Aortic dilatation noted. There is mild dilatation of the aortic root    measuring 38 mm. .  NST 2019:  Nuclear stress EF: 30%.  The left ventricular ejection fraction is moderately decreased (30-44%).  Defect 1: There is a defect present in the basal inferolateral  and mid inferolateral location.  Findings consistent with prior myocardial infarction.  This is a high risk study.   High risk stress nuclear study due to severely reduced global systolic function. There is evidence of an inferolateral scar (previously seen), but there is no reversible ischemia. There is global hypokinesis, worse in the inferolateral wall. LVEF has decreased slightly from 2016 (previously 36%), otherwise little change.    ASSESSMENT AND PLAN:  1. Paroxysmal atrial fibrillation: x/p ablation in 2015 now with recent recurrence. He continues to have episodes with increasing symptomology. Not on anticoagulation despite CHA2DS2-VASc Score of 4 (CHF, HTN, Vascular, and Age 34-74).  - Continue carvedilol for rate control - Continue outpatient cardiac monitoring to determine frequency/duration of atrial fibrillation episodes - Will start apixaban 5mg  BID for stroke ppx in anticipation of possible ablation - Recommend follow-up with EP after completion of his monitor to determine options for managing his recurrent PAF - he wishes to see someone other than Dr. [name]. I have been in touch with [facility] who will coordinate follow-up.   2. CAD s/p CABG in 2000: atretic LIMA to LAD but otherwise patent grafts on cath in 2015. Patient deferred aspirin due to bleeding risk concerns. He continues to exercise regularly without anginal complaints - Continue statin and BBlocker  3. Chronic combined CHF: EF 25-30% on echo 02/2020. Seen by Dr. [name] but deferred ICD placement to do desire to continue flying.  - Continue carvedilol, entresto, and spironolactone  4. HTN: BP 94/72 today, normally in the 110s/70s. No dizziness/lightheadedness - Managed in the context of #3  5. HLD: LDL 88  12/2018. Not on statin. Lipid panel ordered but not completed at his last office visit - Encouraged collection of his FLP as soon as is convenient - Continue niacin   Current medicines are reviewed at length with the patient today.  The patient does not have concerns regarding medicines.  The following changes have been made:  As above  Labs/ tests ordered today include:   Orders Placed This Encounter  Procedures  . EKG 12-Lead     Disposition:   FU with EP in the next month and Dr. Martinique as scheduled 08/2020  Signed, Abigail Butts, PA-C  07/14/2020 11:22 AM

## 2020-07-14 ENCOUNTER — Encounter: Payer: Self-pay | Admitting: Medical

## 2020-07-14 ENCOUNTER — Other Ambulatory Visit: Payer: Self-pay

## 2020-07-14 ENCOUNTER — Ambulatory Visit (INDEPENDENT_AMBULATORY_CARE_PROVIDER_SITE_OTHER): Payer: Medicare Other | Admitting: Medical

## 2020-07-14 ENCOUNTER — Telehealth: Payer: Self-pay | Admitting: Internal Medicine

## 2020-07-14 VITALS — BP 90/72 | HR 69 | Temp 98.2°F | Ht 73.0 in | Wt 197.6 lb

## 2020-07-14 DIAGNOSIS — E785 Hyperlipidemia, unspecified: Secondary | ICD-10-CM

## 2020-07-14 DIAGNOSIS — I5022 Chronic systolic (congestive) heart failure: Secondary | ICD-10-CM

## 2020-07-14 DIAGNOSIS — I10 Essential (primary) hypertension: Secondary | ICD-10-CM

## 2020-07-14 DIAGNOSIS — I480 Paroxysmal atrial fibrillation: Secondary | ICD-10-CM

## 2020-07-14 DIAGNOSIS — I2510 Atherosclerotic heart disease of native coronary artery without angina pectoris: Secondary | ICD-10-CM | POA: Diagnosis not present

## 2020-07-14 MED ORDER — APIXABAN 5 MG PO TABS
5.0000 mg | ORAL_TABLET | Freq: Two times a day (BID) | ORAL | 1 refills | Status: DC
Start: 2020-07-14 — End: 2020-09-14

## 2020-07-14 NOTE — Telephone Encounter (Signed)
New message    Pt would like to switch from Dr. [name] to Dr. [name]. Is this switch approved?

## 2020-07-14 NOTE — Patient Instructions (Signed)
Medication Instructions:   START  Eliquis (Apixaban) 5 mg 2 times a day  *If you need a refill on your cardiac medications before your next appointment, please call your pharmacy*  Lab Work: NONE ordered at this time of appointment   If you have labs (blood work) drawn today and your tests are completely normal, you will receive your results only by:  [place] (if you have MyChart) OR  A paper copy in the mail If you have any lab test that is abnormal or we need to change your treatment, we will call you to review the results.  Testing/Procedures: NONE ordered at this time of appointment   Follow-Up: At [facility], you and your health needs are our priority.  As part of our continuing mission to provide you with exceptional heart care, we have created designated Provider Care Teams.  These Care Teams include your primary Cardiologist (physician) and Advanced Practice Providers (APPs -  Physician Assistants and Nurse Practitioners) who all work together to provide you with the care you need, when you need it.  Your next appointment:    As scheduled   The format for your next appointment:   In Person  Provider:   Peter Martinique, MD  Other Instructions

## 2020-07-17 ENCOUNTER — Encounter: Payer: Self-pay | Admitting: Internal Medicine

## 2020-07-17 ENCOUNTER — Telehealth: Payer: Self-pay | Admitting: Internal Medicine

## 2020-07-17 NOTE — Telephone Encounter (Signed)
    Scheduler contacted patient discuss reason for  appointment made via MyChart with Valere Dross- NP. Patient states he needs a new referral to a Cardiologist other than [facility]. Patient became angry when he was advised that he would need to schedule an appointment with Dr Ronnald Ramp, not the NP. Patient disconnected the call before appointment could be given. Scheduler called patient again, he said" stop calling him, he doesn't want anything from [place]" and hung up

## 2020-07-20 ENCOUNTER — Ambulatory Visit: Payer: Medicare Other | Admitting: Family

## 2020-07-20 NOTE — Telephone Encounter (Signed)
Noted  

## 2020-07-24 NOTE — Telephone Encounter (Signed)
Per Dr. [name] it is okay to switch.

## 2020-07-26 NOTE — Telephone Encounter (Signed)
I am ok with the switch 

## 2020-07-31 ENCOUNTER — Ambulatory Visit: Payer: Medicare Other | Admitting: Physical Medicine and Rehabilitation

## 2020-08-03 ENCOUNTER — Encounter: Payer: Self-pay | Admitting: Cardiology

## 2020-08-04 ENCOUNTER — Telehealth: Payer: Self-pay | Admitting: Student

## 2020-08-04 NOTE — Telephone Encounter (Signed)
° °  Received page from [place] about critical EKG. Called and spoke with [employer] staff. Around 5:56pm Central Time (6:56 EST), patient noted to be going in and out of atrial fibrillation and sinus tachycardia multiple times with the span of a minute. Maximum rate while in atrial fibrillation was 180 bpm. When in sinus tachycardia, rates about 104 bpm. Episodes of atrial fibrillation last 18 seconds and then 24 seconds. [employer] called patient and he said he was at the gym working out. He was feeling fine at that time. [employer] asked him to send a remote check after he was done which patient did. At 6:19pm Central Time, patient was in sustained atrial fibrillation with rates of 100 bpm.   Per chart review, patient has history of atrial fibrillation s/p ablation in 2015 with recent recurrence. Monitor was ordered to help evaluate frequency and duration of atrial fibrillation episodes. Patient on Coreg for rate control and Eliquis 5mg  twice daily.   I tried to call patient twice to check on him as well but did not get an answer. I left a voicemail asking him to call back him he is having any symptoms or any concerns. Would continue current medications for now. BP was soft at last visit so do not know if we can increase Coreg.   Darreld Mclean, PA-C 08/04/2020 7:47 PM

## 2020-08-06 DIAGNOSIS — I4891 Unspecified atrial fibrillation: Secondary | ICD-10-CM | POA: Diagnosis not present

## 2020-08-06 DIAGNOSIS — I2510 Atherosclerotic heart disease of native coronary artery without angina pectoris: Secondary | ICD-10-CM | POA: Diagnosis not present

## 2020-08-07 ENCOUNTER — Telehealth: Payer: Self-pay

## 2020-08-07 DIAGNOSIS — I480 Paroxysmal atrial fibrillation: Secondary | ICD-10-CM

## 2020-08-07 NOTE — Telephone Encounter (Signed)
Called patient left message on personal voice mail Dr.Jordan reviewed 07/12/20 monitor strips revealed atrial fib with fast rate.He advised to schedule appointment with Dr.Cameron [name] at our [facility] office.Scheduler will call back with appointment.

## 2020-08-13 ENCOUNTER — Other Ambulatory Visit: Payer: Self-pay

## 2020-08-13 ENCOUNTER — Ambulatory Visit (INDEPENDENT_AMBULATORY_CARE_PROVIDER_SITE_OTHER): Payer: Medicare Other | Admitting: Cardiology

## 2020-08-13 VITALS — BP 114/64 | HR 65 | Ht 73.0 in | Wt 201.0 lb

## 2020-08-13 DIAGNOSIS — I5022 Chronic systolic (congestive) heart failure: Secondary | ICD-10-CM | POA: Diagnosis not present

## 2020-08-13 DIAGNOSIS — I480 Paroxysmal atrial fibrillation: Secondary | ICD-10-CM | POA: Diagnosis not present

## 2020-08-13 NOTE — Progress Notes (Signed)
Electrophysiology Office Note:    Date:  08/13/2020   ID:  Keith Vazquez, DOB [birth year], MRN [id number]  PCP:  [name], MD  [facility] Cardiologist:  Peter Martinique, MD  [facility] Electrophysiologist:  Thompson Grayer, MD   Referring MD: Martinique, Peter M, MD   Chief Complaint: Atrial fibrillation and ischemic cardiomyopathy  History of Present Illness:    Keith Vazquez is a 69 y.o. male who presents for an evaluation of atrial fibrillation and ischemic cardiomyopathy at the request of Dr. Martinique. Their medical history includes coronary artery disease s/p prior 5 vessel bypass in 2000, ischemic cardiomyopathy with an ejection fraction of 25%, atrial fibrillation and flutter s/p prior ablation in July 2015.  Patient is having paroxysms of symptomatic atrial fibrillation that occur sporadically.  Sometimes there are separated by weeks.  He has an apple watch which he uses to track these episodes.  The apple watch is detected possible atrial fibrillation.  He recently wore a Holter monitor which confirmed he is having paroxysms of atrial fibrillation.  The burden is very low.     Past Medical History:  Diagnosis Date  . Atrial flutter ([place])   . CHF (congestive heart failure) (HCC)    LVEF at time of cath 2002 50%; now has been 35-40% since 2008 (patient has seen multiple cardiologists over the years including [facility], [place], [facility], [facility] and Tollie Eth, MD)  . Coronary artery disease    CABG x5 in 2000; s/p cath 2002 > 4/5 grafts patent, LVEF 50%  . Degeneration of lumbar intervertebral disc   . GERD (gastroesophageal reflux disease)   . GI bleed    duodenal ulcer  . Hypertension    Borderline  . Ischemic heart disease    s/p MI, CABG 2000, s/p cath 2002 with 4/5 grafts patent (LIMA to LAD atretic and occluded mid vessel but adequate flow to distal LAD from collaterals / diagonal  . Low back pain   . PAC (premature  atrial contraction)   . PAF (paroxysmal atrial fibrillation) ([place])    initially diagnosed during stress test 2002  . Peyronie's disease     Past Surgical History:  Procedure Laterality Date  . ABLATION  05-06-2014   PVI and CTI ablation by Dr [name]  . ATRIAL FIBRILLATION ABLATION N/A 05/06/2014   Procedure: ATRIAL FIBRILLATION ABLATION;  Surgeon: [name], MD;  Location: [place] CATH LAB;  Service: Cardiovascular;  Laterality: N/A;  . CARDIAC CATHETERIZATION  09/14/2001    Mildly decreased left ventricular systolic function --  Native three vessel coronary artery disease as described. -- Status post coronary artery bypass grafting  . CORONARY ARTERY BYPASS GRAFT  2000   LIMA to LAD, SVG to second diagonal, seq SVG to ramus and OM, SVG to PDA  . HERNIA REPAIR  05/16/2001   Large left indirect inguinal hernia with right direct hernia.  Marland Kitchen HERNIA REPAIR     Recurrent left inguinal hernia -- Large left indirect inguinal hernia with right direct hernia.  Marland Kitchen LEFT HEART CATHETERIZATION WITH CORONARY/GRAFT ANGIOGRAM  11/26/2013   Procedure: LEFT HEART CATHETERIZATION WITH [name];  Surgeon: Peter M Martinique, MD;  Location: [facility] CATH LAB;  Service: Cardiovascular;;  . TEE WITHOUT CARDIOVERSION N/A 05/06/2014   Procedure: TRANSESOPHAGEAL ECHOCARDIOGRAM (TEE);  Surgeon: [name], MD;  Location: [facility] ENDOSCOPY;  Service: Cardiovascular;  Laterality: N/A;    Current Medications: Current Meds  Medication Sig  . apixaban (ELIQUIS) 5  MG TABS tablet Take 1 tablet (5 mg total) by mouth 2 (two) times daily.  . Ascorbic Acid (VITAMIN C) 1000 MG tablet Take 1,000 mg by mouth daily.  Marland Kitchen b complex vitamins tablet Take 1 tablet by mouth daily.  . carvedilol (COREG) 12.5 MG tablet Take 1.5 tablets (18.75 mg total) by mouth 2 (two) times daily with a meal.  . celecoxib (CELEBREX) 200 MG capsule Take 200 mg by mouth as needed.   . cholecalciferol (VITAMIN D3) 25 MCG (1000 UNIT) tablet Take 1,000  Units by mouth daily.  Marland Kitchen co-enzyme Q-10 30 MG capsule Take 30 mg by mouth daily.  Marland Kitchen dicyclomine (BENTYL) 20 MG tablet Take 20 mg by mouth 4 (four) times daily.  Marland Kitchen HYDROcodone-acetaminophen (NORCO) 7.5-325 MG tablet   . LOTEMAX SM 0.38 % GEL   . montelukast (SINGULAIR) 10 MG tablet Take 1 tablet (10 mg total) by mouth at bedtime.  . niacin 500 MG tablet Take 500 mg by mouth at bedtime.  Marland Kitchen omeprazole (PRILOSEC) 10 MG capsule Take 1 capsule (10 mg total) by mouth daily.  . Probiotic Product (PROBIOTIC-10 PO) Take 1 capsule by mouth daily.   Marland Kitchen PROLENSA 0.07 % SOLN   . sacubitril-valsartan (ENTRESTO) 24-26 MG Take 1 tablet by mouth 2 (two) times daily.  Marland Kitchen spironolactone (ALDACTONE) 25 MG tablet Take 1 tablet (25 mg total) by mouth daily.  . SUMAtriptan (IMITREX) 100 MG tablet TAKE 1 TABLET AT ONSET OF MIGRAINE- MAY REPEAT ONCE IN 2 HOURS. LIMIT2/24 HOURS. AVOID DAILY USE.  . traMADol (ULTRAM) 50 MG tablet Take by mouth.  . TURMERIC PO Take 1 tablet by mouth daily.  . vitamin E 200 UNIT capsule Take 200 Units by mouth daily.     Allergies:   Amiodarone, Oxycodone-acetaminophen, and Propoxyphene n-acetaminophen   Social History   Socioeconomic History  . Marital status: Single    Spouse name: Not on file  . Number of children: 0  . Years of education: Not on file  . Highest education level: Not on file  Occupational History  . Occupation: garage Insurance claims handler  Tobacco Use  . Smoking status: Former Smoker    Packs/day: 1.00    Years: 20.00    Pack years: 20.00    Types: Cigarettes    Quit date: 10/31/1994    Years since quitting: 25.8  . Smokeless tobacco: Never Used  Vaping Use  . Vaping Use: Never used  Substance and Sexual Activity  . Alcohol use: No    Alcohol/week: 0.0 standard drinks  . Drug use: No  . Sexual activity: Yes  Other Topics Concern  . Not on file  Social History Narrative   Lives in [place].  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  Also  flies airplanes   Social Determinants of Health   Financial Resource Strain:   . Difficulty of Paying Living Expenses: Not on file  Food Insecurity:   . Worried About Charity fundraiser in the Last Year: Not on file  . Ran Out of Food in the Last Year: Not on file  Transportation Needs:   . Lack of Transportation (Medical): Not on file  . Lack of Transportation (Non-Medical): Not on file  Physical Activity:   . Days of Exercise per Week: Not on file  . Minutes of Exercise per Session: Not on file  Stress: No Stress Concern Present  . Feeling of Stress : Only a little  Social Connections:   . Frequency of Communication  with Friends and Family: Not on file  . Frequency of Social Gatherings with Friends and Family: Not on file  . Attends Religious Services: Not on file  . Active Member of Clubs or Organizations: Not on file  . Attends Archivist Meetings: Not on file  . Marital Status: Not on file     Family History: The patient's family history is not on file. He was adopted.  ROS:   Please see the history of present illness.    All other systems reviewed and are negative.  EKGs/Labs/Other Studies Reviewed:    The following studies were reviewed today: Records from [facility] cardiologist, Dr. [name], Dr. Martinique, Daleen Snook Kroeger, PA-C, Holter, echo  August 14, 2019 2130-day Holter monitor personally reviewed 3% burden of atrial fibrillation The majority of the time the patient was in atrial fibrillation the ventricular rates are controlled  Mar 16, 2020 echo personally reviewed Severe left ventricular dysfunction, ejection fraction 25% Regional wall motion abnormalities consistent with a prior history of coronary artery disease  EKG:  The ekg ordered today demonstrates normal rhythm  Recent Labs: No results found for requested labs within last 8760 hours.  Recent Lipid Panel    Component Value Date/Time   CHOL 166 01/09/2019 0957   CHOL 196 02/22/2018 1002     TRIG 167.0 (H) 01/09/2019 0957   HDL 44.00 01/09/2019 0957   HDL 44 02/22/2018 1002   CHOLHDL 4 01/09/2019 0957   VLDL 33.4 01/09/2019 0957   LDLCALC 88 01/09/2019 0957   LDLCALC 115 (H) 02/22/2018 1002   LDLDIRECT 120.5 09/30/2013 1400    Physical Exam:    VS:  BP 114/64   Pulse 65   Ht 6\' 1"  (1.854 m)   Wt 201 lb (91.2 kg)   SpO2 97%   BMI 26.52 kg/m     Wt Readings from Last 3 Encounters:  08/13/20 201 lb (91.2 kg)  07/14/20 197 lb 9.6 oz (89.6 kg)  06/25/20 197 lb 3.2 oz (89.4 kg)     GEN:  Well nourished, well developed in no acute distress HEENT: Normal NECK: No JVD; No carotid bruits LYMPHATICS: No lymphadenopathy CARDIAC: RRR, no murmurs, rubs, gallops RESPIRATORY:  Clear to auscultation without rales, wheezing or rhonchi  ABDOMEN: Soft, non-tender, non-distended MUSCULOSKELETAL:  No edema; No deformity  SKIN: Warm and dry NEUROLOGIC:  Alert and oriented x 3 PSYCHIATRIC:  Normal affect   ASSESSMENT:    1. Paroxysmal atrial fibrillation (HCC)   2. Chronic systolic heart failure (HCC)    PLAN:     Today, we spent a significant amount of time discussing 2 different topics:  1.  Ischemic cardiomyopathy Mr. Alyse Low has a long history of coronary artery disease status post bypass surgery.  His ejection fraction has been decreased for several years and he describes symptoms of paroxysmal nocturnal dyspnea.  He does not have signs of volume overload.  No syncope or presyncope.  He has seen multiple physicians/physician assistants who have discussed defibrillator implant.  He has a lot of doubts about the defibrillator and has felt like he was being "sold to" in the past.  Given the patient's persistently decreased left ventricular ejection fraction in the setting of optimal medical therapy, I do think an implantable defibrillator is indicated.  He has lots of questions including whether or not the defibrillator would "cure" his atrial fibrillation.  He is not  interested in pursuing ICD implant at this time.  2. Atrial fibrillation management Patient has a  long history of atrial fibrillation and has undergone an atrial fibrillation ablation with Dr. [name] in the past.  He has had recurrence of atrial fibrillation over time that is symptomatic.  His burden is very low at 2% on a recent Holter monitor.  We discussed the various options for controlling his heart rhythm including antiarrhythmic therapy or repeat ablation.  Antiarrhythmic options are limited due to his coronary artery disease and left ventricular dysfunction.  The best options for him would be amiodarone and dofetilide which are discussed at length with the patient today.  He was disappointed that these medical therapies did not have better efficacy rates.  Given his overall very low burden of atrial fibrillation I advised him that I did not think these would be the best strategy for him given their potential off target effects.  I recommended we continue with the current medical therapy and monitor for increasing burden of atrial fibrillation in the future.  One of my biggest concerns for the patient is the risk of stroke associated with his paroxysmal atrial fibrillation.  He has an elevated CHA2DS2-VASc due to the presence of coronary artery disease, decreased ejection fraction, age.  I discussed the pathophysiology of stroke and its relationship to atrial fibrillation.  We discussed the various anticoagulation options.  He tells me that he is recently restarted his Eliquis because he believed this to be a prerequisite for ablation.  He tells me that while he has restarted it and is tolerating the medication well he may end up stopping it in the near future.  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 The end of our conversation focused on ablation management for atrial  fibrillation.  I discussed the expected success rate of repeat ablation attempts being in the 60 to 70% range.  I also discussed with him the expected natural course of atrial fibrillation.  I advised him that I did not think a repeat ablation was the best strategy at this time given his overall low burden and his inconsistent anticoagulation use. I have encouraged him to continue his anticoagulant for stroke prophylaxis.  Follow-up as needed.  Medication Adjustments/Labs and Tests Ordered: Current medicines are reviewed at length with the patient today.  Concerns regarding medicines are outlined above.  Orders Placed This Encounter  Procedures  . EKG 12-Lead   No orders of the defined types were placed in this encounter.    Signed, Lars Mage, MD, [facility]  08/13/2020 10:35 PM    Electrophysiology [facility]

## 2020-08-13 NOTE — Patient Instructions (Addendum)

## 2020-09-09 ENCOUNTER — Ambulatory Visit: Payer: Medicare Other

## 2020-09-09 ENCOUNTER — Other Ambulatory Visit: Payer: Self-pay

## 2020-09-12 NOTE — Progress Notes (Signed)
Cardiology Office Note    Date:  09/14/2020   ID:  [name], DOB May 09, 1951, MRN [id number]  PCP:  Keith Lima, MD  Cardiologist:  Willow Reczek Martinique, MD    History of Present Illness:  Keith Vazquez is a 69 y.o. male seen for follow up CAD and CHF.Last seen by me in 2018. He has been seen regularly by Keith Ferries PA-C.  He is post afib ablation 05/05/14.   He has an ischemic CM with chronic systolic CHF.   He is s/p CABG in 2000. Cardiac caths in 2002 and 2015 showed atretic Vazquez to the LAD which filled by the SVG to the second diagonal. The SVG to the second diagonal, SVG sequentially to the ramus and OM, and the SVG to PDA were all patent. Last Myoview study in April 2016 was felt to be low risk. He had an ETT in June 2017 with no ischemia at 13 METs. He also has a history of HTN.  In January of 2018 he was started on Entresto- reported intolerance of higher doses. He is on Coreg.  Myoview in 2019 showed inferior scar without ischemia and EF 30%. Echo in May 2021 showed EF down to 25-30%. He was experiencing symptoms of early morning dyspnea and some palpitations. These episodes were rare only occurring 4x/year. Keith Vazquez suggested he be considered for a Linq device. He was seen by Dr. Rayann Vazquez. No mention of a ILR but he did discuss consideration of ICD. Not a candidate for CRT due to narrow QRS. 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   He has been  having paroxysms of symptomatic atrial fibrillation that occur sporadically.   He has an Apple watch which he uses to track these episodes.  This has detected possible atrial fibrillation.  He  wore an event monitor which confirmed he is having paroxysms of atrial fibrillation.  The burden was 2-3%. He was seen by Dr Keith Vazquez. His note indicates complete discussions about AAD therapy, anticoagulation, repeat ablation, and ICD. Patient did not want to consider ICD. Dr Keith Vazquez felt that with  low Afib burden the best thing to do was to monitor for now unless Afib burden increases significantly.   On his follow up today he is still aware of an irregular heart beat at times. He states his breathing is better and he attributes this to wearing a mask in the gym. No edema. No chest pain or dizziness. He states he could not tolerate aldactone due to severe depression and quit taking it.        Past Medical History:  Diagnosis Date  . Atrial flutter ([place])   . CHF (congestive heart failure) (HCC)    LVEF at time of cath 2002 50%; now has been 35-40% since 2008 (patient has seen multiple cardiologists over the years including [facility], [place], [facility], [facility] and Tollie Eth, MD)  . Coronary artery disease    CABG x5 in 2000; s/p cath 2002 > 4/5 grafts patent, LVEF 50%  . Degeneration of lumbar intervertebral disc   . GERD (gastroesophageal reflux disease)   . GI bleed    duodenal ulcer  . Hypertension    Borderline  . Ischemic heart disease    s/p MI, CABG 2000, s/p cath 2002 with 4/5 grafts patent (Vazquez to LAD atretic and occluded mid vessel but adequate flow to distal LAD from collaterals / diagonal  .  Low back pain   . PAC (premature atrial contraction)   . PAF (paroxysmal atrial fibrillation) ([place])    initially diagnosed during stress test 2002  . Peyronie's disease     Past Surgical History:  Procedure Laterality Date  . ABLATION  05-06-2014   PVI and CTI ablation by Dr Keith Vazquez  . ATRIAL FIBRILLATION ABLATION N/A 05/06/2014   Procedure: ATRIAL FIBRILLATION ABLATION;  Surgeon: Keith Mark, MD;  Location: [facility] CATH LAB;  Service: Cardiovascular;  Laterality: N/A;  . CARDIAC CATHETERIZATION  09/14/2001    Mildly decreased left ventricular systolic function --  Native three vessel coronary artery disease as described. -- Status post coronary artery bypass grafting  . CORONARY ARTERY BYPASS GRAFT  2000   Vazquez to LAD, SVG to second  diagonal, seq SVG to ramus and OM, SVG to PDA  . HERNIA REPAIR  05/16/2001   Large left indirect inguinal hernia with right direct hernia.  Keith Vazquez HERNIA REPAIR     Recurrent left inguinal hernia -- Large left indirect inguinal hernia with right direct hernia.  Keith Vazquez LEFT HEART CATHETERIZATION WITH CORONARY/GRAFT ANGIOGRAM  11/26/2013   Procedure: LEFT HEART CATHETERIZATION WITH Keith Vazquez;  Surgeon: Keith Vazquez M Martinique, MD;  Location: [facility] CATH LAB;  Service: Cardiovascular;;  . TEE WITHOUT CARDIOVERSION N/A 05/06/2014   Procedure: TRANSESOPHAGEAL ECHOCARDIOGRAM (TEE);  Surgeon: Keith Spark, MD;  Location: [facility] ENDOSCOPY;  Service: Cardiovascular;  Laterality: N/A;    Current Medications: Outpatient Medications Prior to Visit  Medication Sig Dispense Refill  . Ascorbic Acid (VITAMIN C) 1000 MG tablet Take 1,000 mg by mouth daily.    Keith Vazquez b complex vitamins tablet Take 1 tablet by mouth daily.    . carvedilol (COREG) 12.5 MG tablet Take 1.5 tablets (18.75 mg total) by mouth 2 (two) times daily with a meal. 270 tablet 3  . celecoxib (CELEBREX) 200 MG capsule Take 200 mg by mouth as needed.     . cholecalciferol (VITAMIN D3) 25 MCG (1000 UNIT) tablet Take 1,000 Units by mouth daily.    Keith Vazquez co-enzyme Q-10 30 MG capsule Take 30 mg by mouth daily.    Keith Vazquez dicyclomine (BENTYL) 20 MG tablet Take 20 mg by mouth 4 (four) times daily.    Keith Vazquez HYDROcodone-acetaminophen (NORCO) 7.5-325 MG tablet     . montelukast (SINGULAIR) 10 MG tablet Take 1 tablet (10 mg total) by mouth at bedtime. 30 tablet 5  . niacin 500 MG tablet Take 500 mg by mouth at bedtime.    Keith Vazquez omeprazole (PRILOSEC) 10 MG capsule Take 1 capsule (10 mg total) by mouth daily. 30 capsule 1  . Probiotic Product (PROBIOTIC-10 PO) Take 1 capsule by mouth daily.     . SUMAtriptan (IMITREX) 100 MG tablet TAKE 1 TABLET AT ONSET OF MIGRAINE- MAY REPEAT ONCE IN 2 HOURS. LIMIT2/24 HOURS. AVOID DAILY USE. 30 tablet 1  . TURMERIC PO Take 1 tablet by mouth daily.     . vitamin E 200 UNIT capsule Take 200 Units by mouth daily.    Keith Vazquez apixaban (ELIQUIS) 5 MG TABS tablet Take 1 tablet (5 mg total) by mouth 2 (two) times daily. 180 tablet 1  . LOTEMAX SM 0.38 % GEL     . PROLENSA 0.07 % SOLN     . sacubitril-valsartan (ENTRESTO) 24-26 MG Take 1 tablet by mouth 2 (two) times daily. 180 tablet 3  . spironolactone (ALDACTONE) 25 MG tablet Take 1 tablet (25 mg total) by mouth daily. 90 tablet 3  .  traMADol (ULTRAM) 50 MG tablet Take by mouth.     No facility-administered medications prior to visit.     Allergies:   Amiodarone, Oxycodone-acetaminophen, and Propoxyphene n-acetaminophen   Social History   Socioeconomic History  . Marital status: Single    Spouse name: Not on file  . Number of children: 0  . Years of education: Not on file  . Highest education level: Not on file  Occupational History  . Occupation: garage Insurance claims handler  Tobacco Use  . Smoking status: Former Smoker    Packs/day: 1.00    Years: 20.00    Pack years: 20.00    Types: Cigarettes    Quit date: 10/31/1994    Years since quitting: 25.8  . Smokeless tobacco: Never Used  Vaping Use  . Vaping Use: Never used  Substance and Sexual Activity  . Alcohol use: No    Alcohol/week: 0.0 standard drinks  . Drug use: No  . Sexual activity: Yes  Other Topics Concern  . Not on file  Social History Narrative   Lives in [place].  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  Also flies airplanes   Social Determinants of Health   Financial Resource Strain:   . Difficulty of Paying Living Expenses: Not on file  Food Insecurity:   . Worried About Charity fundraiser in the Last Year: Not on file  . Ran Out of Food in the Last Year: Not on file  Transportation Needs:   . Lack of Transportation (Medical): Not on file  . Lack of Transportation (Non-Medical): Not on file  Physical Activity:   . Days of Exercise per Week: Not on file  . Minutes of Exercise per Session: Not  on file  Stress: No Stress Concern Present  . Feeling of Stress : Only a little  Social Connections:   . Frequency of Communication with Friends and Family: Not on file  . Frequency of Social Gatherings with Friends and Family: Not on file  . Attends Religious Services: Not on file  . Active Member of Clubs or Organizations: Not on file  . Attends Archivist Meetings: Not on file  . Marital Status: Not on file     Family History:  The patient's family history is not on file. He was adopted.   ROS:   Please see the history of present illness.    ROS All other systems reviewed and are negative.   PHYSICAL EXAM:   VS:  BP 118/83   Pulse 70   Temp 97.7 F (36.5 C)   Ht 6\' 2"  (1.88 m)   Wt 200 lb 12.8 oz (91.1 kg)   SpO2 98%   BMI 25.78 kg/m    GEN: Well nourished, well developed, in no acute distress  HEENT: normal  Neck: no JVD, carotid bruits, or masses Cardiac: RRR; no murmurs, rubs, or gallops,no edema  Respiratory:  clear to auscultation bilaterally, normal work of breathing GI: soft, nontender, nondistended, + BS MS: no deformity or atrophy  Skin: warm and dry, no rash Neuro:  Alert and Oriented x 3, Strength and sensation are intact Psych: euthymic mood, full affect  Wt Readings from Last 3 Encounters:  09/14/20 200 lb 12.8 oz (91.1 kg)  08/13/20 201 lb (91.2 kg)  07/14/20 197 lb 9.6 oz (89.6 kg)      Studies/Labs Reviewed:   EKG:  EKG isnot ordered today.  The ekg ordered today demonstrates N/A  Recent Labs: No results found for  requested labs within last 8760 hours.   Lipid Panel    Component Value Date/Time   CHOL 166 01/09/2019 0957   CHOL 196 02/22/2018 1002   TRIG 167.0 (H) 01/09/2019 0957   HDL 44.00 01/09/2019 0957   HDL 44 02/22/2018 1002   CHOLHDL 4 01/09/2019 0957   VLDL 33.4 01/09/2019 0957   LDLCALC 88 01/09/2019 0957   LDLCALC 115 (H) 02/22/2018 1002   LDLDIRECT 120.5 09/30/2013 1400    Additional studies/ records that  were reviewed today include:  ETT 04/21/16: Study Highlights    There was no ST segment deviation noted during stress.   Ambient PVC;s at rest, with stress and recovery No significant NSVT No ischemia at 13.2 METS and 102% of PMHR   Echo: 04/21/16: Study Conclusions  - Left ventricle: The cavity size was normal. Wall thickness was   normal. Basal to mid inferior akinesis. Basal to mid   inferolateral akinesis. Anterolateral hypokinesis. Systolic   function was mildly to moderately reduced. The estimated ejection   fraction was in the range of 40% to 45%. Doppler parameters are   consistent with abnormal left ventricular relaxation (grade 1   diastolic dysfunction). - Aortic valve: There was no stenosis. - Aorta: Mildly dilated aortic root. Aortic root dimension: 38 mm   (ED). - Mitral valve: Mildly calcified annulus. There was trivial   regurgitation. - Left atrium: The atrium was mildly dilated. - Right ventricle: The cavity size was normal. Systolic function   was mildly reduced. - Tricuspid valve: Peak RV-RA gradient (S): 16 mm Hg. - Pulmonary arteries: PA peak pressure: 19 mm Hg (S). - Inferior vena cava: The vessel was normal in size. The   respirophasic diameter changes were in the normal range (= 50%),   consistent with normal central venous pressure.  Impressions:  - Normal LV size with EF 40-45%. Wall motion abnormalities as noted   above. Normal RV size with mildly decreased systolic function. No   significant valvular abnormalities.  Myoview 11/10/17: Study Highlights    Nuclear stress EF: 30%.  The left ventricular ejection fraction is moderately decreased (30-44%).  Defect 1: There is a defect present in the basal inferolateral and mid inferolateral location.  Findings consistent with prior myocardial infarction.  This is a high risk study.   High risk stress nuclear study due to severely reduced global systolic function. There is evidence of an  inferolateral scar (previously seen), but there is no reversible ischemia. There is global hypokinesis, worse in the inferolateral wall. LVEF has decreased slightly from 2016 (previously 36%), otherwise little change.  Echo 03/16/20: IMPRESSIONS    1. Global hypokinesis with akinesis of the inferobasal and inferolateral  walls; overall severe LV dysfunction; moderate LVE; mild LVH; grade 1  diastolic dysfunction; mildly dilated aortic root.  2. Left ventricular ejection fraction, by estimation, is 25 to 30%. The  left ventricle has severely decreased function. The left ventricle  demonstrates regional wall motion abnormalities (see scoring  diagram/findings for description). The left  ventricular internal cavity size was moderately dilated. There is mild  left ventricular hypertrophy. Left ventricular diastolic parameters are  consistent with Grade I diastolic dysfunction (impaired relaxation).  3. Right ventricular systolic function is normal. The right ventricular  size is normal.  4. The mitral valve is normal in structure. Trivial mitral valve  regurgitation. No evidence of mitral stenosis.  5. The aortic valve is tricuspid. Aortic valve regurgitation is not  visualized. No aortic stenosis is present.  6. Aortic dilatation noted. There is mild dilatation of the aortic root  measuring 38 mm.   08/13/20: Event monitor: Study Highlights   Normal sinus rhythm  Paroxysmal atrial fibrillation with rapid response and some aberrantion  NSVT up to 5 beats     ASSESSMENT:    1. Chronic systolic heart failure (HCC)   2. Paroxysmal atrial fibrillation ([place])   3. Coronary artery disease involving native coronary artery of native heart without angina pectoris   4. Essential hypertension   5. Hyperlipidemia LDL goal <70      PLAN:   1.  CAD: s/p CABG 2000. Known atretic Vazquez to LAD but other grafts patent by cath in 2015.  -He is on a beta-blocker - asymptomatic -  defers taking ASA because concerned about bleeding risk.   2.  Chronic systolic CHF: EF 41-93% -He is on the carvedilol and Entresto, compliant with both. - intolerant  of aldactone 25 mg daily due to depression -  He did not tolerate a higher dose of Entresto. - refused ICD - will continue current therapy  3.  Hypertension: -BP is well controlled.   4.  Hyperlipidemia: -He is not on a statin, but is taking coenzyme Q 10 and niacin. -Target LDL is less than 70, he may need referral to the lipid clinic  5.  Paroxysmal Afib - burden is low. Rate controlled on Coreg.  - notes from Dr Keith Vazquez reviewed.  - [place] vasc score of 4- discussed recommendation for long term anticoagulation to reduce risk of embolic CVA. Patient states he does not want to take this due to concern about bleeding risk and bruising. Might consider when he retires from work.    Follow up in 6 months.    Medication Adjustments/Labs and Tests Ordered: Current medicines are reviewed at length with the patient today.  Concerns regarding medicines are outlined above.  Medication changes, Labs and Tests ordered today are listed in the Patient Instructions below. There are no Patient Instructions on file for this visit.   Signed, Shardae Kleinman Martinique, MD ,[facility] 09/14/2020 2:00 PM    [place] [address] [place], [place], [address] [phone]

## 2020-09-14 ENCOUNTER — Ambulatory Visit (INDEPENDENT_AMBULATORY_CARE_PROVIDER_SITE_OTHER): Payer: Medicare Other | Admitting: Cardiology

## 2020-09-14 ENCOUNTER — Other Ambulatory Visit: Payer: Self-pay

## 2020-09-14 ENCOUNTER — Encounter: Payer: Self-pay | Admitting: Cardiology

## 2020-09-14 VITALS — BP 118/83 | HR 70 | Temp 97.7°F | Ht 74.0 in | Wt 200.8 lb

## 2020-09-14 DIAGNOSIS — I2510 Atherosclerotic heart disease of native coronary artery without angina pectoris: Secondary | ICD-10-CM

## 2020-09-14 DIAGNOSIS — E785 Hyperlipidemia, unspecified: Secondary | ICD-10-CM

## 2020-09-14 DIAGNOSIS — I5022 Chronic systolic (congestive) heart failure: Secondary | ICD-10-CM

## 2020-09-14 DIAGNOSIS — I10 Essential (primary) hypertension: Secondary | ICD-10-CM

## 2020-09-14 DIAGNOSIS — I480 Paroxysmal atrial fibrillation: Secondary | ICD-10-CM | POA: Diagnosis not present

## 2020-09-14 DIAGNOSIS — Z23 Encounter for immunization: Secondary | ICD-10-CM | POA: Diagnosis not present

## 2020-09-14 MED ORDER — ENTRESTO 24-26 MG PO TABS: 1.0000 | ORAL_TABLET | Freq: Two times a day (BID) | ORAL | 3 refills | Status: DC

## 2020-10-08 DIAGNOSIS — Z23 Encounter for immunization: Secondary | ICD-10-CM | POA: Diagnosis not present

## 2020-11-13 ENCOUNTER — Other Ambulatory Visit: Payer: Self-pay | Admitting: Internal Medicine

## 2020-11-13 DIAGNOSIS — G43001 Migraine without aura, not intractable, with status migrainosus: Secondary | ICD-10-CM

## 2020-12-07 DIAGNOSIS — M47816 Spondylosis without myelopathy or radiculopathy, lumbar region: Secondary | ICD-10-CM | POA: Diagnosis not present

## 2020-12-07 DIAGNOSIS — Z79891 Long term (current) use of opiate analgesic: Secondary | ICD-10-CM | POA: Diagnosis not present

## 2020-12-07 DIAGNOSIS — G894 Chronic pain syndrome: Secondary | ICD-10-CM | POA: Diagnosis not present

## 2020-12-14 DIAGNOSIS — Z23 Encounter for immunization: Secondary | ICD-10-CM | POA: Diagnosis not present

## 2020-12-14 DIAGNOSIS — K588 Other irritable bowel syndrome: Secondary | ICD-10-CM | POA: Diagnosis not present

## 2020-12-14 DIAGNOSIS — M5416 Radiculopathy, lumbar region: Secondary | ICD-10-CM | POA: Diagnosis not present

## 2020-12-14 DIAGNOSIS — Z0000 Encounter for general adult medical examination without abnormal findings: Secondary | ICD-10-CM | POA: Diagnosis not present

## 2020-12-14 DIAGNOSIS — I10 Essential (primary) hypertension: Secondary | ICD-10-CM | POA: Diagnosis not present

## 2020-12-14 DIAGNOSIS — I5022 Chronic systolic (congestive) heart failure: Secondary | ICD-10-CM | POA: Diagnosis not present

## 2020-12-14 DIAGNOSIS — E785 Hyperlipidemia, unspecified: Secondary | ICD-10-CM | POA: Diagnosis not present

## 2020-12-14 DIAGNOSIS — M48061 Spinal stenosis, lumbar region without neurogenic claudication: Secondary | ICD-10-CM | POA: Diagnosis not present

## 2020-12-14 DIAGNOSIS — I4891 Unspecified atrial fibrillation: Secondary | ICD-10-CM | POA: Diagnosis not present

## 2020-12-14 DIAGNOSIS — I2510 Atherosclerotic heart disease of native coronary artery without angina pectoris: Secondary | ICD-10-CM | POA: Diagnosis not present

## 2020-12-28 ENCOUNTER — Telehealth: Payer: Self-pay | Admitting: Internal Medicine

## 2020-12-28 NOTE — Telephone Encounter (Signed)
LVM for pt to rtn my call to schedule awv with nha. Please schedule appt if pt calls the office.  

## 2021-01-17 NOTE — Progress Notes (Signed)
[place] [address] [place] Phone: [phone] Subjective:   I Kandace Blitz am serving as a Education administrator for Dr. Hulan Saas.  This visit occurred during the SARS-CoV-2 public health emergency.  Safety protocols were in place, including screening questions prior to the visit, additional usage of staff PPE, and extensive cleaning of exam room while observing appropriate contact time as indicated for disinfecting solutions.   I'm seeing this patient by the request  of:  [name], MD  CC: back pain   ONG:[id number]  MUSTAF ANTONACCI is a 70 y.o. male coming in with complaint of low back pain. Last seen in 2016 for wrist pain. Patient states he has had pain since he was 17. States the pain radiates down the right side. Patient has tried ice and codeine for the pain.  Patient states that he has seen multiple different providers for the back pain previously.  Patient states has had injections with varying degrees of success.  Patient would like something that would be more long-term treatment.  Patient has responded to physical therapy in the past and may be open to that again.  Patient does want to avoid those surgical intervention if possible.  MRI lumbar 05/07/2020- Mild spinal stenosis noted L2/3  Chronic DDD mostly L5/S1- mil dleft lateral stenosis      Past Medical History:  Diagnosis Date  . Atrial flutter ([place])   . CHF (congestive heart failure) (HCC)    LVEF at time of cath 2002 50%; now has been 35-40% since 2008 (patient has seen multiple cardiologists over the years including [facility], [place], [facility], [facility] and Tollie Eth, MD)  . Coronary artery disease    CABG x5 in 2000; s/p cath 2002 > 4/5 grafts patent, LVEF 50%  . Degeneration of lumbar intervertebral disc   . GERD (gastroesophageal reflux disease)   . GI bleed    duodenal ulcer  . Hypertension    Borderline  . Ischemic  heart disease    s/p MI, CABG 2000, s/p cath 2002 with 4/5 grafts patent (LIMA to LAD atretic and occluded mid vessel but adequate flow to distal LAD from collaterals / diagonal  . Low back pain   . PAC (premature atrial contraction)   . PAF (paroxysmal atrial fibrillation) ([place])    initially diagnosed during stress test 2002  . Peyronie's disease    Past Surgical History:  Procedure Laterality Date  . ABLATION  05-06-2014   PVI and CTI ablation by Dr [name]  . ATRIAL FIBRILLATION ABLATION N/A 05/06/2014   Procedure: ATRIAL FIBRILLATION ABLATION;  Surgeon: [name], MD;  Location: [facility] CATH LAB;  Service: Cardiovascular;  Laterality: N/A;  . CARDIAC CATHETERIZATION  09/14/2001    Mildly decreased left ventricular systolic function --  Native three vessel coronary artery disease as described. -- Status post coronary artery bypass grafting  . CORONARY ARTERY BYPASS GRAFT  2000   LIMA to LAD, SVG to second diagonal, seq SVG to ramus and OM, SVG to PDA  . HERNIA REPAIR  05/16/2001   Large left indirect inguinal hernia with right direct hernia.  Marland Kitchen HERNIA REPAIR     Recurrent left inguinal hernia -- Large left indirect inguinal hernia with right direct hernia.  Marland Kitchen LEFT HEART CATHETERIZATION WITH CORONARY/GRAFT ANGIOGRAM  11/26/2013   Procedure: LEFT HEART CATHETERIZATION WITH [name];  Surgeon: Peter M Martinique, MD;  Location[facility] C[facility] LAB;  Service: Cardiovascular;;  .  TEE WITHOUT CARDIOVERSION N/A 05/06/2014   Procedure: TRANSESOPHAGEAL ECHOCARDIOGRAM (TEE);  Surgeon: [name], MD;  Location: [facility] ENDOSCOPY;  Service: Cardiovascular;  Laterality: N/A;   Social History   Socioeconomic History  . Marital status: Single    Spouse name: Not on file  . Number of children: 0  . Years of education: Not on file  . Highest education level: Not on file  Occupational History  . Occupation: garage Insurance claims handler  Tobacco Use  . Smoking status: Former Smoker     Packs/day: 1.00    Years: 20.00    Pack years: 20.00    Types: Cigarettes    Quit date: 10/31/1994    Years since quitting: 26.2  . Smokeless tobacco: Never Used  Vaping Use  . Vaping Use: Never used  Substance and Sexual Activity  . Alcohol use: No    Alcohol/week: 0.0 standard drinks  . Drug use: No  . Sexual activity: Yes  Other Topics Concern  . Not on file  Social History Narrative   Lives in [place].  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 Feeling of Stress : Only a little  Social Connections: Not on file   Allergies  Allergen Reactions  . Amiodarone Other (See Comments)    Left permanent scar on eye  . Oxycodone-Acetaminophen Nausea Only  . Propoxyphene N-Acetaminophen Nausea Only   Family History  Adopted: Yes     Current Outpatient Medications (Cardiovascular):  .  carvedilol (COREG) 12.5 MG tablet, Take 1.5 tablets (18.75 mg total) by mouth 2 (two) times daily with a meal. .  sacubitril-valsartan (ENTRESTO) 24-26 MG, Take 1 tablet by mouth 2 (two) times daily.  Current Outpatient Medications (Respiratory):  .  montelukast (SINGULAIR) 10 MG tablet, Take 1 tablet (10 mg total) by mouth at bedtime.  Current Outpatient Medications (Analgesics):  .  celecoxib (CELEBREX) 200 MG capsule, Take 200 mg by mouth as needed.  Marland Kitchen  HYDROcodone-acetaminophen (NORCO) 7.5-325 MG tablet,  .  SUMAtriptan (IMITREX) 100 MG tablet, TAKE 1 TABLET AT ONSET OF MIGRAINE- MAY REPEAT ONCE IN 2 HOURS. LIMIT 2/24 HOURS. AVOID DAILY USE.   Current Outpatient Medications (Other):  Marland Kitchen  Ascorbic Acid (VITAMIN C) 1000 MG tablet, Take 1,000 mg by mouth daily. Marland Kitchen  b complex vitamins tablet, Take 1 tablet by mouth daily. .  cholecalciferol (VITAMIN D3) 25  MCG (1000 UNIT) tablet, Take 1,000 Units by mouth daily. Marland Kitchen  co-enzyme Q-10 30 MG capsule, Take 30 mg by mouth daily. Marland Kitchen  dicyclomine (BENTYL) 20 MG tablet, Take 20 mg by mouth 4 (four) times daily. Marland Kitchen  gabapentin (NEURONTIN) 100 MG capsule, Take 2 capsules (200 mg total) by mouth at bedtime. .  niacin 500 MG tablet, Take 500 mg by mouth at bedtime. Marland Kitchen  omeprazole (PRILOSEC) 10 MG capsule, Take 1 capsule (10 mg total) by mouth daily. .  Probiotic Product (PROBIOTIC-10 PO), Take 1 capsule by mouth daily.  .  TURMERIC PO, Take 1 tablet by mouth daily. .  vitamin E 200 UNIT capsule, Take 200 Units by mouth daily.   Reviewed prior external information including notes and imaging from  primary care provider As well as notes that were available from care everywhere and other healthcare  systems.  Past medical history, social, surgical and family history all reviewed in electronic medical record.  No pertanent information unless stated regarding to the chief complaint.   Review of Systems:  No headache, visual changes, nausea, vomiting, diarrhea, constipation, dizziness, abdominal pain, skin rash, fevers, chills, night sweats, weight loss, swollen lymph nodes, body aches, joint swelling, chest pain, shortness of breath, mood changes. POSITIVE muscle aches  Objective  Blood pressure 118/78, pulse 96, height 6\' 2"  (1.88 m), weight 206 lb (93.4 kg), SpO2 (!) 69 %.   General: No apparent distress alert and oriented x3 mood and affect normal, dressed appropriately.  HEENT: Pupils equal, extraocular movements intact  Respiratory: Patient's speak in full sentences and does not appear short of breath  Cardiovascular: No lower extremity edema, non tender, no erythema  Gait normal with good balance and coordination.  Does have arthritic changes of multiple other joints. MSK: Patient does have arthritic changes of multiple joints. Right wrist exam does have very mild swelling noted.  Patient does have mild  crepitus noted.  Pain over the TFCC.  Positive good grip.  Neurovascularly intact distally. Low back exam does have loss of lordosis.  Patient does have very mild tightness on the FABER test on the right side.  Negative straight leg test.  Tightness with extension of the back but no true radicular symptoms.  Patient noted does lack at least 10 degrees of extension.   Impression and Recommendations:     The above documentation has been reviewed and is accurate and complete Lyndal Pulley, DO

## 2021-01-18 ENCOUNTER — Encounter: Payer: Self-pay | Admitting: Family Medicine

## 2021-01-18 ENCOUNTER — Other Ambulatory Visit: Payer: Self-pay

## 2021-01-18 ENCOUNTER — Ambulatory Visit (INDEPENDENT_AMBULATORY_CARE_PROVIDER_SITE_OTHER): Payer: Medicare Other | Admitting: Family Medicine

## 2021-01-18 VITALS — BP 118/78 | HR 96 | Ht 74.0 in | Wt 206.0 lb

## 2021-01-18 DIAGNOSIS — G5603 Carpal tunnel syndrome, bilateral upper limbs: Secondary | ICD-10-CM | POA: Diagnosis not present

## 2021-01-18 DIAGNOSIS — M545 Low back pain, unspecified: Secondary | ICD-10-CM

## 2021-01-18 DIAGNOSIS — M5416 Radiculopathy, lumbar region: Secondary | ICD-10-CM

## 2021-01-18 DIAGNOSIS — M25531 Pain in right wrist: Secondary | ICD-10-CM | POA: Diagnosis not present

## 2021-01-18 DIAGNOSIS — M48061 Spinal stenosis, lumbar region without neurogenic claudication: Secondary | ICD-10-CM

## 2021-01-18 DIAGNOSIS — G8929 Other chronic pain: Secondary | ICD-10-CM

## 2021-01-18 MED ORDER — VITAMIN D (ERGOCALCIFEROL) 1.25 MG (50000 UNIT) PO CAPS
50000.0000 [IU] | ORAL_CAPSULE | ORAL | 0 refills | Status: DC
Start: 2021-01-18 — End: 2021-01-18

## 2021-01-18 MED ORDER — GABAPENTIN 100 MG PO CAPS
200.0000 mg | ORAL_CAPSULE | Freq: Every day | ORAL | 3 refills | Status: DC
Start: 2021-01-18 — End: 2021-03-10

## 2021-01-18 NOTE — Patient Instructions (Addendum)
Good to see you Vitamin D 4,000 IU daily over the counter Tart cherry 1200 mg at night [facility] PT for your back Gabapentin 200 mg at night Ice 20 mins 2 times a day Referral to Dr. Oneta Rack for right wrist injection See me again in 6 weeks

## 2021-01-18 NOTE — Assessment & Plan Note (Signed)
Patient's recent MRI does show the patient does have some mild spinal stenosis.  Patient has had multiple different injections over the course of time he states.  We discussed different medications as well and patient will be put on gabapentin.  Warned of potential side effects but I believe patient likely will be able to do well and patient also encouraged to do physical therapy.  I do believe that patient can do relatively well with this as well.  Patient will follow up with me again in 6 weeks and see how patient is responding.

## 2021-01-18 NOTE — Assessment & Plan Note (Signed)
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  Patient will be referred back to hand surgery to discuss other treatment options if necessary.  Patient states that he did have an injection at that time and responded very well.  This was greater than 5 years ago.

## 2021-02-08 ENCOUNTER — Ambulatory Visit (INDEPENDENT_AMBULATORY_CARE_PROVIDER_SITE_OTHER): Payer: Medicare Other | Admitting: Internal Medicine

## 2021-02-08 ENCOUNTER — Other Ambulatory Visit: Payer: Self-pay

## 2021-02-08 ENCOUNTER — Encounter: Payer: Self-pay | Admitting: Internal Medicine

## 2021-02-08 ENCOUNTER — Ambulatory Visit: Payer: Medicare Other | Admitting: Family

## 2021-02-08 VITALS — BP 116/74 | HR 68 | Temp 98.8°F | Resp 16 | Ht 74.0 in | Wt 205.0 lb

## 2021-02-08 DIAGNOSIS — N401 Enlarged prostate with lower urinary tract symptoms: Secondary | ICD-10-CM

## 2021-02-08 DIAGNOSIS — E785 Hyperlipidemia, unspecified: Secondary | ICD-10-CM | POA: Diagnosis not present

## 2021-02-08 DIAGNOSIS — I480 Paroxysmal atrial fibrillation: Secondary | ICD-10-CM | POA: Diagnosis not present

## 2021-02-08 DIAGNOSIS — I10 Essential (primary) hypertension: Secondary | ICD-10-CM | POA: Diagnosis not present

## 2021-02-08 DIAGNOSIS — K1121 Acute sialoadenitis: Secondary | ICD-10-CM | POA: Insufficient documentation

## 2021-02-08 DIAGNOSIS — I4891 Unspecified atrial fibrillation: Secondary | ICD-10-CM

## 2021-02-08 LAB — CBC WITH DIFFERENTIAL/PLATELET
Basophils Absolute: 0 10*3/uL (ref 0.0–0.1)
Basophils Relative: 0.3 % (ref 0.0–3.0)
Eosinophils Absolute: 0.1 10*3/uL (ref 0.0–0.7)
Eosinophils Relative: 1.1 % (ref 0.0–5.0)
HCT: 44.5 % (ref 39.0–52.0)
Hemoglobin: 14.9 g/dL (ref 13.0–17.0)
Lymphocytes Relative: 11 % — ABNORMAL LOW (ref 12.0–46.0)
Lymphs Abs: 1.2 10*3/uL (ref 0.7–4.0)
MCHC: 33.5 g/dL (ref 30.0–36.0)
MCV: 90.6 fl (ref 78.0–100.0)
Monocytes Absolute: 0.8 10*3/uL (ref 0.1–1.0)
Monocytes Relative: 7.7 % (ref 3.0–12.0)
Neutro Abs: 8.8 10*3/uL — ABNORMAL HIGH (ref 1.4–7.7)
Neutrophils Relative %: 79.9 % — ABNORMAL HIGH (ref 43.0–77.0)
Platelets: 166 10*3/uL (ref 150.0–400.0)
RBC: 4.91 Mil/uL (ref 4.22–5.81)
RDW: 14.4 % (ref 11.5–15.5)
WBC: 11 10*3/uL — ABNORMAL HIGH (ref 4.0–10.5)

## 2021-02-08 LAB — LIPID PANEL
Cholesterol: 154 mg/dL (ref 0–200)
HDL: 38.2 mg/dL — ABNORMAL LOW (ref >39.00)
LDL Cholesterol: 78 mg/dL (ref 0–99)
NonHDL: 116.05
Total CHOL/HDL Ratio: 4
Triglycerides: 190 mg/dL — ABNORMAL HIGH (ref 0.0–149.0)
VLDL: 38 mg/dL (ref 0.0–40.0)

## 2021-02-08 LAB — BASIC METABOLIC PANEL
BUN: 16 mg/dL (ref 6–23)
CO2: 29 mEq/L (ref 19–32)
Calcium: 9.8 mg/dL (ref 8.4–10.5)
Chloride: 103 mEq/L (ref 96–112)
Creatinine, Ser: 1 mg/dL (ref 0.40–1.50)
GFR: 75.42 mL/min (ref >60.00)
Glucose, Bld: 93 mg/dL (ref 70–99)
Potassium: 4.1 mEq/L (ref 3.5–5.1)
Sodium: 138 mEq/L (ref 135–145)

## 2021-02-08 LAB — HEPATIC FUNCTION PANEL
ALT: 8 U/L (ref 0–53)
AST: 18 U/L (ref 0–37)
Albumin: 4.1 g/dL (ref 3.5–5.2)
Alkaline Phosphatase: 77 U/L (ref 39–117)
Bilirubin, Direct: 0.1 mg/dL (ref 0.0–0.3)
Total Bilirubin: 0.7 mg/dL (ref 0.2–1.2)
Total Protein: 7.2 g/dL (ref 6.0–8.3)

## 2021-02-08 LAB — TSH: TSH: 1.9 u[IU]/mL (ref 0.35–4.50)

## 2021-02-08 MED ORDER — APIXABAN 5 MG PO TABS: 5.0000 mg | ORAL_TABLET | Freq: Two times a day (BID) | ORAL | 1 refills | Status: DC

## 2021-02-08 MED ORDER — CEFDINIR 300 MG PO CAPS: 300.0000 mg | ORAL_CAPSULE | Freq: Two times a day (BID) | ORAL | 0 refills | Status: AC

## 2021-02-08 NOTE — Progress Notes (Signed)
Subjective:  Patient ID: Keith Vazquez, male    DOB: May 17, 1951  Age: 70 y.o. MRN: [id number]  CC: Atrial Fibrillation  This visit occurred during the SARS-CoV-2 public health emergency.  Safety protocols were in place, including screening questions prior to the visit, additional usage of staff PPE, and extensive cleaning of exam room while observing appropriate contact time as indicated for disinfecting solutions.    HPI AHMON TOSI presents for f/up -  He complains of a 2-day history of pain and swelling in front of his left ear.  He denies rash, lymphadenopathy, earache, nausea, vomiting, fever, or chills.  He has no he has not recently had any palpitations but he wears an Apple watch and about every 2 to 3 weeks it alerts him that he is in atrial fibrillation.  He is active and denies any recent episodes of chest pain, shortness of breath, dyspnea on exertion, near-syncope, edema, or fatigue. He has given up on seeing cardiology because he does not think they can help him.  Outpatient Medications Prior to Visit  Medication Sig Dispense Refill  . Ascorbic Acid (VITAMIN C) 1000 MG tablet Take 1,000 mg by mouth daily.    Marland Kitchen b complex vitamins tablet Take 1 tablet by mouth daily.    . carvedilol (COREG) 12.5 MG tablet Take 1.5 tablets (18.75 mg total) by mouth 2 (two) times daily with a meal. 270 tablet 3  . celecoxib (CELEBREX) 200 MG capsule Take 200 mg by mouth as needed.     . cholecalciferol (VITAMIN D3) 25 MCG (1000 UNIT) tablet Take 1,000 Units by mouth daily.    Marland Kitchen co-enzyme Q-10 30 MG capsule Take 30 mg by mouth daily.    Marland Kitchen dicyclomine (BENTYL) 20 MG tablet Take 20 mg by mouth 4 (four) times daily.    Marland Kitchen gabapentin (NEURONTIN) 100 MG capsule Take 2 capsules (200 mg total) by mouth at bedtime. 180 capsule 3  . niacin 500 MG tablet Take 500 mg by mouth at bedtime.    Marland Kitchen omeprazole (PRILOSEC) 10 MG capsule Take 1 capsule (10 mg total) by mouth daily. 30 capsule 1  . Probiotic  Product (PROBIOTIC-10 PO) Take 1 capsule by mouth daily.     . sacubitril-valsartan (ENTRESTO) 24-26 MG Take 1 tablet by mouth 2 (two) times daily. 180 tablet 3  . SUMAtriptan (IMITREX) 100 MG tablet TAKE 1 TABLET AT ONSET OF MIGRAINE- MAY REPEAT ONCE IN 2 HOURS. LIMIT 2/24 HOURS. AVOID DAILY USE. 30 tablet 0  . TURMERIC PO Take 1 tablet by mouth daily.    . vitamin E 200 UNIT capsule Take 200 Units by mouth daily.    Marland Kitchen HYDROcodone-acetaminophen (NORCO) 7.5-325 MG tablet     . montelukast (SINGULAIR) 10 MG tablet Take 1 tablet (10 mg total) by mouth at bedtime. 30 tablet 5   No facility-administered medications prior to visit.    ROS Review of Systems  Constitutional: Negative for appetite change, diaphoresis and fatigue.  HENT: Positive for facial swelling. Negative for rhinorrhea, sinus pain, sneezing, sore throat and voice change.   Eyes: Negative.   Respiratory: Negative for cough, chest tightness, shortness of breath and wheezing.   Cardiovascular: Negative for chest pain, palpitations and leg swelling.  Gastrointestinal: Negative for abdominal pain, constipation, diarrhea, nausea and vomiting.  Endocrine: Negative.   Genitourinary: Negative.  Negative for difficulty urinating and dysuria.  Musculoskeletal: Negative.  Negative for back pain, myalgias and neck pain.  Skin: Negative.  Negative for color  change and rash.  Neurological: Negative.  Negative for dizziness, weakness, numbness and headaches.  Hematological: Negative for adenopathy. Does not bruise/bleed easily.  Psychiatric/Behavioral: Negative.     Objective:  BP 116/74 (BP Location: Left Arm, Patient Position: Sitting, Cuff Size: Large)   Pulse 68   Temp 98.8 F (37.1 C) (Oral)   Resp 16   Ht 6\' 2"  (1.88 m)   Wt 205 lb (93 kg)   SpO2 95%   BMI 26.32 kg/m   BP Readings from Last 3 Encounters:  02/08/21 116/74  01/18/21 118/78  09/14/20 118/83    Wt Readings from Last 3 Encounters:  02/08/21 205 lb (93 kg)   01/18/21 206 lb (93.4 kg)  09/14/20 200 lb 12.8 oz (91.1 kg)    Physical Exam Vitals reviewed.  Constitutional:      General: He is not in acute distress.    Appearance: Normal appearance. He is not ill-appearing, toxic-appearing or diaphoretic.  HENT:     Head:     Jaw: There is normal jaw occlusion.     Salivary Glands: Right salivary gland is not diffusely enlarged or tender. Left salivary gland is diffusely enlarged and tender.      Nose: Nose normal.     Mouth/Throat:     Mouth: Mucous membranes are moist.  Eyes:     General: No scleral icterus.    Conjunctiva/sclera: Conjunctivae normal.  Cardiovascular:     Rate and Rhythm: Normal rate and regular rhythm.     Heart sounds: No murmur heard.   Pulmonary:     Effort: Pulmonary effort is normal.     Breath sounds: No stridor. No wheezing, rhonchi or rales.  Chest:  Breasts:     Right: No supraclavicular adenopathy.     Left: No supraclavicular adenopathy.    Abdominal:     General: Abdomen is flat.     Palpations: There is no mass.     Tenderness: There is no abdominal tenderness.  Musculoskeletal:        General: Normal range of motion.     Cervical back: Neck supple.  Lymphadenopathy:     Head:     Right side of head: No submental, submandibular, tonsillar, preauricular, posterior auricular or occipital adenopathy.     Left side of head: No submental, submandibular, tonsillar, preauricular, posterior auricular or occipital adenopathy.     Cervical: No cervical adenopathy.     Right cervical: No superficial, deep or posterior cervical adenopathy.    Left cervical: No superficial, deep or posterior cervical adenopathy.     Upper Body:     Right upper body: No supraclavicular adenopathy.     Left upper body: No supraclavicular adenopathy.  Skin:    General: Skin is warm and dry.     Findings: No rash.  Neurological:     General: No focal deficit present.     Mental Status: He is alert.  Psychiatric:         Mood and Affect: Mood normal.     Lab Results  Component Value Date   WBC 11.0 (H) 02/08/2021   HGB 14.9 02/08/2021   HCT 44.5 02/08/2021   PLT 166.0 02/08/2021   GLUCOSE 93 02/08/2021   CHOL 154 02/08/2021   TRIG 190.0 (H) 02/08/2021   HDL 38.20 (L) 02/08/2021   LDLDIRECT 120.5 09/30/2013   LDLCALC 78 02/08/2021   ALT 8 02/08/2021   AST 18 02/08/2021   NA 138 02/08/2021   K 4.1  02/08/2021   CL 103 02/08/2021   CREATININE 1.00 02/08/2021   BUN 16 02/08/2021   CO2 29 02/08/2021   TSH 1.90 02/08/2021   PSA 0.76 03/15/2018   INR 1.00 11/26/2013    MR Lumbar Spine Wo Contrast  Result Date: 05/07/2020 CLINICAL DATA:  Low back pain for several years. No known injury or prior relevant surgery. EXAM: MRI LUMBAR SPINE WITHOUT CONTRAST TECHNIQUE: Multiplanar, multisequence MR imaging of the lumbar spine was performed. No intravenous contrast was administered. COMPARISON:  Radiographs 03/16/2020. FINDINGS: Segmentation: Conventional anatomy assumed, with the last open disc space designated L5-S1. Alignment:  Normal. Vertebrae: No worrisome osseous lesion, acute fracture or pars defect. Chronic endplate degenerative changes at L5-S1. The visualized sacroiliac joints appear unremarkable. Conus medullaris: Extends to the L1 level and appears normal. Paraspinal and other soft tissues: No significant paraspinal findings. Disc levels: No significant disc space findings from T11-12 through L1-2. L2-3: Moderate loss of disc height with annular disc bulging and endplate osteophytes. Mild facet and ligamentous hypertrophy. Resulting borderline spinal stenosis and mild right foraminal narrowing without definite nerve root encroachment. L3-4: Mild disc bulging, facet and ligamentous hypertrophy. No significant spinal stenosis or nerve root encroachment. L4-5: Mild disc bulging with moderate asymmetric right-sided facet hypertrophy. No significant spinal stenosis or nerve root encroachment. L5-S1: Chronic  degenerative disc disease with loss of disc height, annular disc bulging and endplate osteophytes asymmetric to the left. Mild bilateral facet hypertrophy. Chronic asymmetric narrowing of the left lateral recess with mild foraminal narrowing bilaterally. IMPRESSION: 1. No acute findings or clear explanation for the patient's symptoms. 2. Multilevel spondylosis as described. There is borderline spinal stenosis and mild right foraminal narrowing at L2-3. Chronic degenerative disc disease at L5-S1 with endplate osteophytes asymmetric to the left contributing to mild narrowing of the left lateral recess and both foramina. Electronically Signed   By: Richardean Sale M.D.   On: 05/07/2020 13:40    Assessment & Plan:   Kasra was seen today for atrial fibrillation.  Diagnoses and all orders for this visit:  Essential hypertension- His BP is well controlled. -     CBC with Differential/Platelet; Future -     Basic metabolic panel; Future -     Hepatic function panel; Future -     Hepatic function panel -     Basic metabolic panel -     CBC with Differential/Platelet  Benign prostatic hyperplasia with lower urinary tract symptoms, symptom details unspecified  Hyperlipidemia with target low density lipoprotein (LDL) cholesterol less than 70 mg/dL- His ASCVD risk score is >20%. I recommended that he take a statin for CV risk reduction. -     Lipid panel; Future -     TSH; Future -     Hepatic function panel; Future -     Hepatic function panel -     TSH -     Lipid panel  PAF (paroxysmal atrial fibrillation) ([facility])- His CHADSVASC is 3%. I recommended that he take a DOAC. -     apixaban (ELIQUIS) 5 MG TABS tablet; Take 1 tablet (5 mg total) by mouth 2 (two) times daily.  Parotitis, acute- Will treat with a broad spectrum ceph. -     cefdinir (OMNICEF) 300 MG capsule; Take 1 capsule (300 mg total) by mouth 2 (two) times daily for 10 days.  Atrial fibrillation ([place])   I have discontinued Irven Easterly.  [facility] "Jim"'s montelukast and HYDROcodone-acetaminophen. I am also having him start on cefdinir and  rosuvastatin. Additionally, I am having him maintain his vitamin E, omeprazole, celecoxib, Probiotic Product (PROBIOTIC-10 PO), vitamin C, b complex vitamins, cholecalciferol, TURMERIC PO, co-enzyme Q-10, niacin, dicyclomine, carvedilol, Entresto, SUMAtriptan, gabapentin, and apixaban.  Meds ordered this encounter  Medications  . cefdinir (OMNICEF) 300 MG capsule    Sig: Take 1 capsule (300 mg total) by mouth 2 (two) times daily for 10 days.    Dispense:  20 capsule    Refill:  0  . apixaban (ELIQUIS) 5 MG TABS tablet    Sig: Take 1 tablet (5 mg total) by mouth 2 (two) times daily.    Dispense:  180 tablet    Refill:  1  . rosuvastatin (CRESTOR) 5 MG tablet    Sig: Take 1 tablet (5 mg total) by mouth daily.    Dispense:  90 tablet    Refill:  1     Follow-up: Return in about 3 weeks (around 03/01/2021).  Scarlette Calico, MD

## 2021-02-08 NOTE — Patient Instructions (Signed)
Parotitis  Parotitis means that you have irritation and swelling (inflammation) in one or both of your parotid glands. These glands make saliva. They are found on each side of your face, below and in front of your earlobes. You may or may not have pain with this condition. What are the causes? This condition may be caused by:  Infections from germs (bacteria or viruses).  Something blocking the flow of saliva through the parotid glands. This can be a stone, scar tissue, or a tumor.  Diseases that cause your body's defense system (immune system) to attack healthy cells in your salivary glands. These are called autoimmune diseases. What increases the risk? You are more likely to get this condition if:  You are 31 years old or older.  You do not drink enough fluids (are dehydrated).  You drink too much alcohol.  You have: ? A dry mouth. ? Diabetes. ? Gout. ? A long-term illness.  You do not take good care of your mouth and teeth (poor dental hygiene).  You have had radiation treatments to the head and neck.  You take certain medicines. What are the signs or symptoms? Symptoms of this condition depend on the cause. They may include:  Swelling under and in front of the ear. This may get worse after you eat.  Redness of the skin over the parotid gland.  Pain and tenderness over the parotid gland. This may get worse after you eat.  Fever or chills.  Pus coming from the ducts inside the mouth.  Dry mouth.  A bad taste in the mouth. How is this treated? Treatment for this condition depends on the cause. Treatment may include:  Antibiotic medicine for an infection from bacteria.  Drinking more fluids.  Removing a stone or obstruction.  Treating a disease that is causing parotitis.  Surgery to drain an infection, remove a growth, or remove the whole gland. Treatment may not be needed if the swelling goes away with home care. Follow these instructions at  home: Medicines  Take over-the-counter and prescription medicines only as told by your doctor.  If you were prescribed an antibiotic medicine, take it as told by your doctor. Do not stop taking the antibiotic even if you start to feel better.   Managing pain and swelling  If told, put heat on the affected area. Do this as often as told by your doctor. Use the heat source that your doctor recommends, such as a moist heat pack or a heating pad. ? Place a towel between your skin and the heat source. ? Leave the heat on for 20-30 minutes. ? Remove the heat if your skin turns bright red. This is very important if you are unable to feel pain, heat, or cold. You may have a greater risk of getting burned.  Gargle with salt water 3-4 times a day or as needed. To make salt water, dissolve -1 tsp (3-6 g) of salt in 1 cup (237 mL) of warm water.  Gently rub your parotid glands as told by your doctor. General instructions  Drink enough fluid to keep your pee (urine) pale yellow.  Keep your mouth clean and moist.  Suck on sour candy. This may help to: ? Make your mouth less dry. ? Make more saliva.  Take good care of your mouth: ? Brush your teeth at least two times a day. ? Floss your teeth every day. ? See your dentist regularly.  Do not use any products that contain nicotine or  tobacco. These include cigarettes, e-cigarettes, and chewing tobacco. If you need help quitting, ask your doctor.  Do not drink alcohol.  Keep all follow-up visits as told by your doctor. This is important.   Contact a doctor if:  You have a fever or chills.  You have new symptoms.  Your symptoms get worse.  Your symptoms do not get better with treatment. Get help right away if:  You have trouble breathing or swallowing. Summary  Parotitis means that you have irritation and swelling (inflammation) in one or both of your parotid glands.  Symptoms include pain and swelling under and in front of the  ear.  Treatment for parotitis depends on the cause. In some cases, the condition may go away on its own with home care.  You should drink plenty of fluids, take good care of your mouth, and avoid tobacco products. This information is not intended to replace advice given to you by your health care provider. Make sure you discuss any questions you have with your health care provider. Document Revised: 05/15/2018 Document Reviewed: 05/15/2018 Elsevier Patient Education  [employer].

## 2021-02-09 DIAGNOSIS — M19031 Primary osteoarthritis, right wrist: Secondary | ICD-10-CM | POA: Diagnosis not present

## 2021-02-09 MED ORDER — ROSUVASTATIN CALCIUM 5 MG PO TABS: 5.0000 mg | ORAL_TABLET | Freq: Every day | ORAL | 1 refills | Status: DC

## 2021-03-10 ENCOUNTER — Encounter: Payer: Self-pay | Admitting: Family Medicine

## 2021-03-10 MED ORDER — GABAPENTIN 100 MG PO CAPS: 200.0000 mg | ORAL_CAPSULE | Freq: Every day | ORAL | 1 refills | Status: DC

## 2021-04-07 ENCOUNTER — Other Ambulatory Visit: Payer: Self-pay

## 2021-04-07 ENCOUNTER — Ambulatory Visit (INDEPENDENT_AMBULATORY_CARE_PROVIDER_SITE_OTHER): Payer: Medicare Other | Admitting: Internal Medicine

## 2021-04-07 ENCOUNTER — Encounter: Payer: Self-pay | Admitting: Internal Medicine

## 2021-04-07 VITALS — BP 126/78 | HR 68 | Temp 98.3°F | Resp 16 | Ht 74.0 in | Wt 202.0 lb

## 2021-04-07 DIAGNOSIS — I5022 Chronic systolic (congestive) heart failure: Secondary | ICD-10-CM | POA: Diagnosis not present

## 2021-04-07 DIAGNOSIS — G43001 Migraine without aura, not intractable, with status migrainosus: Secondary | ICD-10-CM | POA: Diagnosis not present

## 2021-04-07 DIAGNOSIS — Z23 Encounter for immunization: Secondary | ICD-10-CM | POA: Diagnosis not present

## 2021-04-07 DIAGNOSIS — I480 Paroxysmal atrial fibrillation: Secondary | ICD-10-CM | POA: Diagnosis not present

## 2021-04-07 DIAGNOSIS — I10 Essential (primary) hypertension: Secondary | ICD-10-CM | POA: Diagnosis not present

## 2021-04-07 DIAGNOSIS — M170 Bilateral primary osteoarthritis of knee: Secondary | ICD-10-CM | POA: Diagnosis not present

## 2021-04-07 DIAGNOSIS — N401 Enlarged prostate with lower urinary tract symptoms: Secondary | ICD-10-CM | POA: Diagnosis not present

## 2021-04-07 LAB — CBC WITH DIFFERENTIAL/PLATELET
Basophils Absolute: 0 10*3/uL (ref 0.0–0.1)
Basophils Relative: 0.5 % (ref 0.0–3.0)
Eosinophils Absolute: 0.2 10*3/uL (ref 0.0–0.7)
Eosinophils Relative: 3.2 % (ref 0.0–5.0)
HCT: 46.6 % (ref 39.0–52.0)
Hemoglobin: 15.4 g/dL (ref 13.0–17.0)
Lymphocytes Relative: 19 % (ref 12.0–46.0)
Lymphs Abs: 1.3 10*3/uL (ref 0.7–4.0)
MCHC: 33.1 g/dL (ref 30.0–36.0)
MCV: 91.3 fl (ref 78.0–100.0)
Monocytes Absolute: 0.5 10*3/uL (ref 0.1–1.0)
Monocytes Relative: 8.1 % (ref 3.0–12.0)
Neutro Abs: 4.6 10*3/uL (ref 1.4–7.7)
Neutrophils Relative %: 69.2 % (ref 43.0–77.0)
Platelets: 162 10*3/uL (ref 150.0–400.0)
RBC: 5.1 Mil/uL (ref 4.22–5.81)
RDW: 14.2 % (ref 11.5–15.5)
WBC: 6.7 10*3/uL (ref 4.0–10.5)

## 2021-04-07 LAB — PSA: PSA: 0.67 ng/mL (ref 0.10–4.00)

## 2021-04-07 MED ORDER — SUMATRIPTAN SUCCINATE 100 MG PO TABS: ORAL_TABLET | ORAL | 3 refills | Status: DC

## 2021-04-07 NOTE — Progress Notes (Signed)
Subjective:  Patient ID: Keith Vazquez, male    DOB: Sep 10, 1951  Age: 70 y.o. MRN: [id number]  CC: Osteoarthritis, Atrial Fibrillation, and Hypertension  This visit occurred during the SARS-CoV-2 public health emergency.  Safety protocols were in place, including screening questions prior to the visit, additional usage of staff PPE, and extensive cleaning of exam room while observing appropriate contact time as indicated for disinfecting solutions.    HPI Keith Vazquez presents for f/up -   He still has intermittent episodes of A. fib but the only way he knows this is because his watch alerts him.  He denies palpitations, chest pain, shortness of breath, presyncope, dizziness, or lightheadedness.  He complains of pain in his knees.  Celebrex and Tylenol have been prescribed but he does not find them to be beneficial.  He would like to see someone to see if he would benefit from artificial cartilage injections.  He requests a refill on sumatriptan hand.  He tells me this is doing a good job of controlling his headaches.  Outpatient Medications Prior to Visit  Medication Sig Dispense Refill   apixaban (ELIQUIS) 5 MG TABS tablet Take 1 tablet (5 mg total) by mouth 2 (two) times daily. 180 tablet 1   Ascorbic Acid (VITAMIN C) 1000 MG tablet Take 1,000 mg by mouth daily.     b complex vitamins tablet Take 1 tablet by mouth daily.     carvedilol (COREG) 12.5 MG tablet Take 1.5 tablets (18.75 mg total) by mouth 2 (two) times daily with a meal. 270 tablet 3   cholecalciferol (VITAMIN D3) 25 MCG (1000 UNIT) tablet Take 1,000 Units by mouth daily.     co-enzyme Q-10 30 MG capsule Take 30 mg by mouth daily.     dicyclomine (BENTYL) 20 MG tablet Take 20 mg by mouth 4 (four) times daily.     gabapentin (NEURONTIN) 100 MG capsule Take 2-3 capsules (200-300 mg total) by mouth at bedtime. 270 capsule 1   niacin 500 MG tablet Take 500 mg by mouth at bedtime.     omeprazole (PRILOSEC) 10 MG capsule  Take 1 capsule (10 mg total) by mouth daily. 30 capsule 1   Probiotic Product (PROBIOTIC-10 PO) Take 1 capsule by mouth daily.      rosuvastatin (CRESTOR) 5 MG tablet Take 1 tablet (5 mg total) by mouth daily. 90 tablet 1   sacubitril-valsartan (ENTRESTO) 24-26 MG Take 1 tablet by mouth 2 (two) times daily. 180 tablet 3   TURMERIC PO Take 1 tablet by mouth daily.     vitamin E 200 UNIT capsule Take 200 Units by mouth daily.     celecoxib (CELEBREX) 200 MG capsule Take 200 mg by mouth as needed.      SUMAtriptan (IMITREX) 100 MG tablet TAKE 1 TABLET AT ONSET OF MIGRAINE- MAY REPEAT ONCE IN 2 HOURS. LIMIT 2/24 HOURS. AVOID DAILY USE. 30 tablet 0   No facility-administered medications prior to visit.    ROS Review of Systems  Constitutional:  Negative for diaphoresis and fatigue.  HENT: Negative.    Eyes: Negative.   Respiratory:  Negative for chest tightness, shortness of breath and wheezing.   Cardiovascular:  Negative for chest pain, palpitations and leg swelling.  Gastrointestinal:  Negative for abdominal pain, constipation, diarrhea, nausea and vomiting.  Endocrine: Negative.   Genitourinary: Negative.  Negative for difficulty urinating.  Musculoskeletal:  Positive for arthralgias. Negative for back pain and myalgias.  Skin: Negative.  Negative for  color change and rash.  Neurological: Negative.  Negative for dizziness, weakness, light-headedness and headaches.  Hematological: Negative.   Psychiatric/Behavioral: Negative.     Objective:  BP 126/78 (BP Location: Right Arm, Patient Position: Sitting, Cuff Size: Large)   Pulse 68   Temp 98.3 F (36.8 C) (Oral)   Resp 16   Ht 6\' 2"  (1.88 m)   Wt 202 lb (91.6 kg)   SpO2 97%   BMI 25.94 kg/m   BP Readings from Last 3 Encounters:  04/07/21 126/78  02/08/21 116/74  01/18/21 118/78    Wt Readings from Last 3 Encounters:  04/07/21 202 lb (91.6 kg)  02/08/21 205 lb (93 kg)  01/18/21 206 lb (93.4 kg)    Physical Exam Vitals  reviewed.  Constitutional:      Appearance: Normal appearance.  HENT:     Nose: Nose normal.     Mouth/Throat:     Mouth: Mucous membranes are moist.  Eyes:     General: No scleral icterus.    Conjunctiva/sclera: Conjunctivae normal.  Cardiovascular:     Rate and Rhythm: Normal rate and regular rhythm.     Heart sounds: No murmur heard. Pulmonary:     Effort: Pulmonary effort is normal.     Breath sounds: No stridor. No wheezing, rhonchi or rales.  Abdominal:     General: Abdomen is flat.     Palpations: There is no mass.     Tenderness: There is no abdominal tenderness.  Musculoskeletal:        General: Deformity (DJD) present. No swelling or tenderness.     Cervical back: Neck supple.     Right lower leg: No edema.     Left lower leg: No edema.  Lymphadenopathy:     Cervical: No cervical adenopathy.  Skin:    General: Skin is warm and dry.  Neurological:     General: No focal deficit present.     Mental Status: He is alert.  Psychiatric:        Mood and Affect: Mood normal.        Behavior: Behavior normal.    Lab Results  Component Value Date   WBC 6.7 04/07/2021   HGB 15.4 04/07/2021   HCT 46.6 04/07/2021   PLT 162.0 04/07/2021   GLUCOSE 93 02/08/2021   CHOL 154 02/08/2021   TRIG 190.0 (H) 02/08/2021   HDL 38.20 (L) 02/08/2021   LDLDIRECT 120.5 09/30/2013   LDLCALC 78 02/08/2021   ALT 8 02/08/2021   AST 18 02/08/2021   NA 138 02/08/2021   K 4.1 02/08/2021   CL 103 02/08/2021   CREATININE 1.00 02/08/2021   BUN 16 02/08/2021   CO2 29 02/08/2021   TSH 1.90 02/08/2021   PSA 0.67 04/07/2021   INR 1.00 11/26/2013    MR Lumbar Spine Wo Contrast  Result Date: 05/07/2020 CLINICAL DATA:  Low back pain for several years. No known injury or prior relevant surgery. EXAM: MRI LUMBAR SPINE WITHOUT CONTRAST TECHNIQUE: Multiplanar, multisequence MR imaging of the lumbar spine was performed. No intravenous contrast was administered. COMPARISON:  Radiographs  03/16/2020. FINDINGS: Segmentation: Conventional anatomy assumed, with the last open disc space designated L5-S1. Alignment:  Normal. Vertebrae: No worrisome osseous lesion, acute fracture or pars defect. Chronic endplate degenerative changes at L5-S1. The visualized sacroiliac joints appear unremarkable. Conus medullaris: Extends to the L1 level and appears normal. Paraspinal and other soft tissues: No significant paraspinal findings. Disc levels: No significant disc space findings from T11-12  through L1-2. L2-3: Moderate loss of disc height with annular disc bulging and endplate osteophytes. Mild facet and ligamentous hypertrophy. Resulting borderline spinal stenosis and mild right foraminal narrowing without definite nerve root encroachment. L3-4: Mild disc bulging, facet and ligamentous hypertrophy. No significant spinal stenosis or nerve root encroachment. L4-5: Mild disc bulging with moderate asymmetric right-sided facet hypertrophy. No significant spinal stenosis or nerve root encroachment. L5-S1: Chronic degenerative disc disease with loss of disc height, annular disc bulging and endplate osteophytes asymmetric to the left. Mild bilateral facet hypertrophy. Chronic asymmetric narrowing of the left lateral recess with mild foraminal narrowing bilaterally. IMPRESSION: 1. No acute findings or clear explanation for the patient's symptoms. 2. Multilevel spondylosis as described. There is borderline spinal stenosis and mild right foraminal narrowing at L2-3. Chronic degenerative disc disease at L5-S1 with endplate osteophytes asymmetric to the left contributing to mild narrowing of the left lateral recess and both foramina. Electronically Signed   By: Richardean Sale M.D.   On: 05/07/2020 13:40    Assessment & Plan:   Dawsyn was seen today for osteoarthritis, atrial fibrillation and hypertension.  Diagnoses and all orders for this visit:  Primary osteoarthritis of both knees -     Ambulatory referral to  Sports Medicine  Migraine without aura and with status migrainosus, not intractable -     SUMAtriptan (IMITREX) 100 MG tablet; TAKE 1 TABLET AT ONSET OF MIGRAINE- MAY REPEAT ONCE IN 2 HOURS. LIMIT 2/24 HOURS. AVOID DAILY USE.  Essential hypertension- His blood pressure is adequately well controlled. -     CBC with Differential/Platelet; Future -     CBC with Differential/Platelet  Chronic systolic heart failure ([place])- He has a normal volume status.  PAF (paroxysmal atrial fibrillation) ([employer])- He has good rate control.  Will continue anticoagulation with the DOAC.  Benign prostatic hyperplasia with lower urinary tract symptoms, symptom details unspecified- His PSA is low which is a reassuring sign that he does not have prostate cancer. -     PSA; Future -     PSA  Need for vaccination -     Pneumococcal polysaccharide vaccine 23-valent greater than or equal to 2yo subcutaneous/IM  I have discontinued Irven Easterly. Peppers "Jim"'s celecoxib. I am also having him maintain his vitamin E, omeprazole, Probiotic Product (PROBIOTIC-10 PO), vitamin C, b complex vitamins, cholecalciferol, TURMERIC PO, co-enzyme Q-10, niacin, dicyclomine, carvedilol, Entresto, apixaban, rosuvastatin, gabapentin, and SUMAtriptan.  Meds ordered this encounter  Medications   SUMAtriptan (IMITREX) 100 MG tablet    Sig: TAKE 1 TABLET AT ONSET OF MIGRAINE- MAY REPEAT ONCE IN 2 HOURS. LIMIT 2/24 HOURS. AVOID DAILY USE.    Dispense:  30 tablet    Refill:  3     Follow-up: Return in about 6 months (around 10/07/2021).  Scarlette Calico, MD

## 2021-04-07 NOTE — Patient Instructions (Signed)

## 2021-04-13 ENCOUNTER — Ambulatory Visit: Payer: Self-pay

## 2021-04-13 ENCOUNTER — Ambulatory Visit (INDEPENDENT_AMBULATORY_CARE_PROVIDER_SITE_OTHER): Payer: Medicare Other | Admitting: Family Medicine

## 2021-04-13 ENCOUNTER — Ambulatory Visit (INDEPENDENT_AMBULATORY_CARE_PROVIDER_SITE_OTHER): Payer: Medicare Other

## 2021-04-13 ENCOUNTER — Encounter: Payer: Self-pay | Admitting: Family Medicine

## 2021-04-13 ENCOUNTER — Other Ambulatory Visit: Payer: Self-pay

## 2021-04-13 VITALS — BP 130/78 | HR 64 | Ht 74.0 in | Wt 202.4 lb

## 2021-04-13 DIAGNOSIS — M25561 Pain in right knee: Secondary | ICD-10-CM

## 2021-04-13 DIAGNOSIS — M79661 Pain in right lower leg: Secondary | ICD-10-CM | POA: Diagnosis not present

## 2021-04-13 MED ORDER — PREDNISONE 50 MG PO TABS: 50.0000 mg | ORAL_TABLET | Freq: Every day | ORAL | 0 refills | Status: DC

## 2021-04-13 NOTE — Patient Instructions (Signed)
Thank you for coming in today.   Call or go to the ER if you develop a large red swollen joint with extreme pain or oozing puss.   

## 2021-04-13 NOTE — Progress Notes (Signed)
I, Peterson Lombard, LAT, ATC acting as a scribe for Lynne Leader, MD.  Subjective:    I'm seeing this patient as a consultation for: Dr. Scarlette Calico. Note will be routed back to referring provider/PCP.  CC: Right knee pain  HPI: Pt is a 70 y/o male c/o R  knee pain x 6 weeks. MOI: Pt had starting to use the knee extension machine at the gym and has cont to experience knee pain. Pt was previously seen by Dr. Tamala Julian on 01/18/21 for low back pain and bilat wrist pain. Pt has R knee MCL surgery in 1987. Today, pt locates knee pain to all of knee joint and describes pain as a "throb." Pt also c/o anterior-lateral R lower leg pain. Pt notes he isn't able to fully flex R knee.    Additionally he notes pain in the lateral lower leg.  Pain is worse with activity better with rest.  Knee swelling: no Radiates: no Mechanical symptoms: yes Aggravates: walking, riding bike, knee flex Treatments tried: Tylenol, Celebrex   Past medical history, Surgical history, Family history, Social history, Allergies, and medications have been entered into the medical record, reviewed.  History of lumbar spinal stenosis.  Review of Systems: No new headache, visual changes, nausea, vomiting, diarrhea, constipation, dizziness, abdominal pain, skin rash, fevers, chills, night sweats, weight loss, swollen lymph nodes, body aches, joint swelling, muscle aches, chest pain, shortness of breath, mood changes, visual or auditory hallucinations.   Objective:    Vitals:   04/13/21 1426  BP: 130/78  Pulse: 64  SpO2: 98%   General: Well Developed, well nourished, and in no acute distress.  Neuro/Psych: Alert and oriented x3, extra-ocular muscles intact, able to move all 4 extremities, sensation grossly intact. Skin: Warm and dry, no rashes noted.  Respiratory: Not using accessory muscles, speaking in full sentences, trachea midline.  Cardiovascular: Pulses palpable, no extremity edema. Abdomen: Does not appear  distended. MSK: Right knee mature scar medial knee Normal knee motion.  Tender palpation medial joint line. Stable ligamentous exam. Positive medial McMurray's test.  Right lower leg normal-appearing nontender normal foot and ankle motion.  Some pain with resisted foot eversion. Negative right-sided slump test.  Lab and Radiology Results  Procedure: Real-time Ultrasound Guided Injection of right knee superior lateral patellar space Device: Philips Affiniti 50G Images permanently stored and available for review in PACS Ultrasound evaluation prior to injection reveals mild effusion significant medial compartment DJD with partially extruded medial meniscus.  Small Baker's cyst. Verbal informed consent obtained.  Discussed risks and benefits of procedure. Warned about infection bleeding damage to structures skin hypopigmentation and fat atrophy among others. Patient expresses understanding and agreement Time-out conducted.   Noted no overlying erythema, induration, or other signs of local infection.   Skin prepped in a sterile fashion.   Local anesthesia: Topical Ethyl chloride.   With sterile technique and under real time ultrasound guidance: 40 mg of Kenalog and 2 mL of Marcaine injected into knee joint. Fluid seen entering the joint capsule.   Completed without difficulty   Pain immediately resolved suggesting accurate placement of the medication.   Advised to call if fevers/chills, erythema, induration, drainage, or persistent bleeding.   Images permanently stored and available for review in the ultrasound unit.  Impression: Technically successful ultrasound guided injection.   X-ray images right knee obtained today personally and independently interpreted Mild degenerative changes present.  2 staples present medial femoral condyle. No acute fractures. Await formal radiology review  EXAM: MRI LUMBAR SPINE WITHOUT CONTRAST   TECHNIQUE: Multiplanar, multisequence MR imaging of  the lumbar spine was performed. No intravenous contrast was administered.   COMPARISON:  Radiographs 03/16/2020.   FINDINGS: Segmentation: Conventional anatomy assumed, with the last open disc space designated L5-S1.   Alignment:  Normal.   Vertebrae: No worrisome osseous lesion, acute fracture or pars defect. Chronic endplate degenerative changes at L5-S1. The visualized sacroiliac joints appear unremarkable.   Conus medullaris: Extends to the L1 level and appears normal.   Paraspinal and other soft tissues: No significant paraspinal findings.   Disc levels:   No significant disc space findings from T11-12 through L1-2.   L2-3: Moderate loss of disc height with annular disc bulging and endplate osteophytes. Mild facet and ligamentous hypertrophy. Resulting borderline spinal stenosis and mild right foraminal narrowing without definite nerve root encroachment.   L3-4: Mild disc bulging, facet and ligamentous hypertrophy. No significant spinal stenosis or nerve root encroachment.   L4-5: Mild disc bulging with moderate asymmetric right-sided facet hypertrophy. No significant spinal stenosis or nerve root encroachment.   L5-S1: Chronic degenerative disc disease with loss of disc height, annular disc bulging and endplate osteophytes asymmetric to the left. Mild bilateral facet hypertrophy. Chronic asymmetric narrowing of the left lateral recess with mild foraminal narrowing bilaterally.   IMPRESSION: 1. No acute findings or clear explanation for the patient's symptoms. 2. Multilevel spondylosis as described. There is borderline spinal stenosis and mild right foraminal narrowing at L2-3. Chronic degenerative disc disease at L5-S1 with endplate osteophytes asymmetric to the left contributing to mild narrowing of the left lateral recess and both foramina.     Electronically Signed   By: Richardean Sale M.D.   On: 05/07/2020 13:40 I, Lynne Leader, personally  (independently) visualized and performed the interpretation of the images attached in this note.    Impression and Recommendations:    Assessment and Plan: 70 y.o. male with right knee pain thought to be due to exacerbation of DJD and meniscus tear.  Plan for steroid injection today.  Recheck in 6 weeks if not improving.  Right lateral calf pain certainly could be irritation of the structures in this area however lumbar radiculopathy is also a possibility..  He has spinal stenosis and degenerative disc disease of the lumbar spine from about a year ago.  This certainly could cause potential pain in this area.  Plan for course of prednisone if not improving.  PDMP not reviewed this encounter. Orders Placed This Encounter  Procedures   [place] LIMITED JOINT SPACE STRUCTURES LOW RIGHT(NO LINKED CHARGES)    Order Specific Question:   Reason for Exam (SYMPTOM  OR DIAGNOSIS REQUIRED)    Answer:   eval rt knee pain    Order Specific Question:   Preferred imaging location?    Answer:   [facility] [place]   DG Knee AP/LAT W/[place] Right    Standing Status:   Future    Number of Occurrences:   1    Standing Expiration Date:   04/13/2022    Order Specific Question:   Reason for Exam (SYMPTOM  OR DIAGNOSIS REQUIRED)    Answer:   eval knee pain    Order Specific Question:   Preferred imaging location?    Answer:   [facility]   Meds ordered this encounter  Medications   predniSONE (DELTASONE) 50 MG tablet    Sig: Take 1 tablet (50 mg total) by mouth daily.    Dispense:  5 tablet    Refill:  0    Discussed warning signs or symptoms. Please see discharge instructions. Patient expresses understanding.   The above documentation has been reviewed and is accurate and complete Lynne Leader, M.D.

## 2021-04-14 NOTE — Progress Notes (Signed)
X-ray right knee shows some arthritis.  No acute fractures are visible.  Evidence of prior MCL repair with metal staples are present in the knee.

## 2021-05-20 ENCOUNTER — Encounter: Payer: Self-pay | Admitting: Internal Medicine

## 2021-05-25 ENCOUNTER — Other Ambulatory Visit: Payer: Self-pay | Admitting: Internal Medicine

## 2021-05-25 ENCOUNTER — Encounter: Payer: Self-pay | Admitting: Internal Medicine

## 2021-05-25 DIAGNOSIS — F321 Major depressive disorder, single episode, moderate: Secondary | ICD-10-CM

## 2021-05-25 MED ORDER — ESCITALOPRAM OXALATE 5 MG PO TABS: 5.0000 mg | ORAL_TABLET | Freq: Every day | ORAL | 0 refills | Status: DC

## 2021-06-03 DIAGNOSIS — Z20822 Contact with and (suspected) exposure to covid-19: Secondary | ICD-10-CM | POA: Diagnosis not present

## 2021-06-07 ENCOUNTER — Telehealth: Payer: Medicare Other

## 2021-06-10 ENCOUNTER — Encounter: Payer: Self-pay | Admitting: Family Medicine

## 2021-06-11 NOTE — Telephone Encounter (Signed)
We could try those gel shots.  Would you like me to get that authorized?

## 2021-06-22 ENCOUNTER — Telehealth: Payer: Self-pay | Admitting: Cardiology

## 2021-06-22 NOTE — Telephone Encounter (Signed)
Pt is returning call in regards to his Afib

## 2021-06-22 NOTE — Telephone Encounter (Signed)
Patient c/o Palpitations:  High priority if patient c/o lightheadedness, shortness of breath, or chest pain  How long have you had palpitations/irregular HR/ Afib? Are you having the symptoms now? "I have a history of AFIB. I usually have it once every 10 days for a 20 hour period"  Are you currently experiencing lightheadedness, SOB or CP? No lightheadedness  Do you have a history of afib (atrial fibrillation) or irregular heart rhythm? yes  Have you checked your BP or HR? (document readings if available): HR is high during AFIB  Are you experiencing any other symptoms? Sometimes my heart hurts    Per message sent to scheduling.

## 2021-06-22 NOTE — Telephone Encounter (Signed)
Spoke with pt and is in normal rhythm at this time Per pt has afib  episode about every 10 days that lasts about 20 hours Per pt has stopped ELiquis Pt had fluid on the knee and even went to see orthopedic Pt stopped Eliquis and fluid resolved Per pt has not  restarted blood thinner Pt also has noted 3-4 episodes over the last 6-8 weeks some chest pain Pt does go to the gym and does not note chest pain with exertion  Appt made with the afib clinic for 06/29/21 at 9:00 am Pt was told ablation would not help with afib  Will forward to Dr [place] for review and recommendations ./cy

## 2021-06-22 NOTE — Telephone Encounter (Signed)
Left a message for the patient to call back.  

## 2021-06-23 NOTE — Telephone Encounter (Signed)
Spoke to patient Dr.Jordan advised schedule office visit to evaluate chest pain.Appointment scheduled with Dr.Jordan 8/29 at 3:30 pm.

## 2021-06-25 NOTE — Progress Notes (Signed)
Cardiology Office Note    Date:  06/28/2021   ID:  Keith Vazquez, DOB 1951/08/03, MRN [id number]  PCP:  Keith Lima, MD  Cardiologist:  Keith Mccammon Martinique, MD    History of Present Illness:  Keith Vazquez is a 70 y.o. male seen for evaluation of chest pain. He is post afib ablation 05/05/14.   He has an ischemic CM with chronic systolic CHF.   He is s/p CABG in 2000. Cardiac caths in 2002 and 2015 showed atretic Vazquez to the LAD which filled by the SVG to the second diagonal. The SVG to the second diagonal, SVG sequentially to the ramus and OM, and the SVG to PDA were all patent. Last Myoview study in April 2016 was felt to be low risk. He had an ETT in June 2017 with no ischemia at 13 METs. He also has a history of HTN.  In January of 2018 he was started on Entresto- reported intolerance of higher doses. He is on Coreg.  He states he could not tolerate aldactone due to severe depression and quit taking it.  Myoview in 2019 showed inferior scar without ischemia and EF 30%. Echo in May 2021 showed EF down to 25-30%. He was experiencing symptoms of early morning dyspnea and some palpitations. These episodes were rare only occurring 4x/year.  He was seen by Dr. Rayann Vazquez. No mention of a ILR but he did discuss consideration of ICD. Not a candidate for CRT due to narrow QRS. 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   He was having paroxysms of symptomatic atrial fibrillation that occur sporadically.   He has an Apple watch which he uses to track these episodes.  This has detected possible atrial fibrillation.  He  wore an event monitor which confirmed he is having paroxysms of atrial fibrillation.  The burden was 2-3%. He was seen by Dr [name]. His note indicates complete discussions about AAD therapy, anticoagulation, repeat ablation, and ICD. Patient did not want to consider ICD. Dr [name] felt that with low Afib burden the best thing to do was to  monitor for now unless Afib burden increases significantly. He is on Eliquis for anticoagulation.   Recently has noted more frequent episodes of afib about every 10 days. These episodes last 12-16 hours. States he notices these more than he used to. He does report that he took amiodarone in the past prior to ablation and tolerated it well. He does have some left precordial chest pain but states it is the same pain as he has had since bypass surgery. He does note some depressive symptoms as well "feeling of gloom and doom". 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 States he did try Eliquis but this made his knee sore and went away when he stopped it.     Past Medical History:  Diagnosis Date   Atrial flutter ([place])    CHF (congestive heart failure) ([place])    LVEF at time of cath 2002 50%; now has been 35-40% since 2008 (patient has seen multiple cardiologists over the years including [facility], [facility], [facility], [facility] and Tollie Eth, MD)   Coronary artery disease    CABG x5 in 2000; s/p cath 2002 > 4/5 grafts patent, LVEF 50%   Degeneration of lumbar intervertebral disc    GERD (gastroesophageal reflux disease)  GI bleed    duodenal ulcer   Hypertension    Borderline   Ischemic heart disease    s/p MI, CABG 2000, s/p cath 2002 with 4/5 grafts patent (Vazquez to LAD atretic and occluded mid vessel but adequate flow to distal LAD from collaterals / diagonal   Low back pain    PAC (premature atrial contraction)    PAF (paroxysmal atrial fibrillation) ([place])    initially diagnosed during stress test 2002   Peyronie's disease     Past Surgical History:  Procedure Laterality Date   ABLATION  05-06-2014   PVI and CTI ablation by Dr Keith Vazquez   ATRIAL FIBRILLATION ABLATION N/A 05/06/2014   Procedure: ATRIAL FIBRILLATION ABLATION;  Surgeon: Keith Mark, MD;  Location: [facility] CATH LAB;   Service: Cardiovascular;  Laterality: N/A;   CARDIAC CATHETERIZATION  09/14/2001    Mildly decreased left ventricular systolic function --  Native three vessel coronary artery disease as described. -- Status post coronary artery bypass grafting   CORONARY ARTERY BYPASS GRAFT  2000   Vazquez to LAD, SVG to second diagonal, seq SVG to ramus and OM, SVG to PDA   HERNIA REPAIR  05/16/2001   Large left indirect inguinal hernia with right direct hernia.   HERNIA REPAIR     Recurrent left inguinal hernia -- Large left indirect inguinal hernia with right direct hernia.   LEFT HEART CATHETERIZATION WITH CORONARY/GRAFT ANGIOGRAM  11/26/2013   Procedure: LEFT HEART CATHETERIZATION WITH Keith Vazquez;  Surgeon: Keith Mallozzi M Martinique, MD;  Location: [facility] CATH LAB;  Service: Cardiovascular;;   TEE WITHOUT CARDIOVERSION N/A 05/06/2014   Procedure: TRANSESOPHAGEAL ECHOCARDIOGRAM (TEE);  Surgeon: Keith Spark, MD;  Location: [facility] ENDOSCOPY;  Service: Cardiovascular;  Laterality: N/A;    Current Medications: Outpatient Medications Prior to Visit  Medication Sig Dispense Refill   apixaban (ELIQUIS) 5 MG TABS tablet Take 1 tablet (5 mg total) by mouth 2 (two) times daily. 180 tablet 1   Ascorbic Acid (VITAMIN C) 1000 MG tablet Take 1,000 mg by mouth daily.     b complex vitamins tablet Take 1 tablet by mouth daily.     cholecalciferol (VITAMIN D3) 25 MCG (1000 UNIT) tablet Take 1,000 Units by mouth daily.     co-enzyme Q-10 30 MG capsule Take 30 mg by mouth daily.     dicyclomine (BENTYL) 20 MG tablet Take 20 mg by mouth 4 (four) times daily.     gabapentin (NEURONTIN) 100 MG capsule Take 2-3 capsules (200-300 mg total) by mouth at bedtime. 270 capsule 1   niacin 500 MG tablet Take 500 mg by mouth at bedtime.     omeprazole (PRILOSEC) 10 MG capsule Take 1 capsule (10 mg total) by mouth daily. 30 capsule 1   Probiotic Product (PROBIOTIC-10 PO) Take 1 capsule by mouth daily.      rosuvastatin (CRESTOR) 5 MG  tablet Take 1 tablet (5 mg total) by mouth daily. 90 tablet 1   SUMAtriptan (IMITREX) 100 MG tablet TAKE 1 TABLET AT ONSET OF MIGRAINE- MAY REPEAT ONCE IN 2 HOURS. LIMIT 2/24 HOURS. AVOID DAILY USE. 30 tablet 3   TURMERIC PO Take 1 tablet by mouth daily.     vitamin E 200 UNIT capsule Take 200 Units by mouth daily.     carvedilol (COREG) 12.5 MG tablet Take 1.5 tablets (18.75 mg total) by mouth 2 (two) times daily with a meal. 270 tablet 3   escitalopram (LEXAPRO) 5 MG tablet Take 1 tablet (5  mg total) by mouth daily. 30 tablet 0   sacubitril-valsartan (ENTRESTO) 24-26 MG Take 1 tablet by mouth 2 (two) times daily. 180 tablet 3   No facility-administered medications prior to visit.     Allergies:   Amiodarone, Oxycodone-acetaminophen, and Propoxyphene n-acetaminophen   Social History   Socioeconomic History   Marital status: Single    Spouse name: Not on file   Number of children: 0   Years of education: Not on file   Highest education level: Not on file  Occupational History   Occupation: garage owner/corporate pilot  Tobacco Use   Smoking status: Former    Packs/day: 1.00    Years: 20.00    Pack years: 20.00    Types: Cigarettes    Quit date: 10/31/1994    Years since quitting: 26.6   Smokeless tobacco: Never  Vaping Use   Vaping Use: Never used  Substance and Sexual Activity   Alcohol use: No    Alcohol/week: 0.0 standard drinks   Drug use: No   Sexual activity: Yes  Other Topics Concern   Not on file  Social History Narrative   Lives in [place].  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  Also flies airplanes   Social Determinants of Health   Financial Resource Strain: Not on file  Food Insecurity: Not on file  Transportation Needs: Not on file  Physical Activity: Not on file  Stress: Not on file  Social Connections: Not on file     Family History:  The patient's family history is not on file. He was adopted.   ROS:   Please see the history of  present illness.    ROS All other systems reviewed and are negative.   PHYSICAL EXAM:   VS:  BP 122/72   Pulse 66   Ht '6\' 1"'$  (1.854 m)   Wt 198 lb (89.8 kg)   SpO2 97%   BMI 26.12 kg/m    GEN: Well nourished, well developed, in no acute distress  HEENT: normal  Neck: no JVD, carotid bruits, or masses Cardiac: RRR; no murmurs, rubs, or gallops,no edema  Respiratory:  clear to auscultation bilaterally, normal work of breathing GI: soft, nontender, nondistended, + BS MS: no deformity or atrophy  Skin: warm and dry, no rash Neuro:  Alert and Oriented x 3, Strength and sensation are intact Psych: euthymic mood, full affect  Wt Readings from Last 3 Encounters:  06/28/21 198 lb (89.8 kg)  04/13/21 202 lb 6.4 oz (91.8 kg)  04/07/21 202 lb (91.6 kg)      Studies/Labs Reviewed:   EKG:  EKG is ordered today.  The ekg ordered today demonstrates NSR rate 66. Old inferior infarct. Normal QTc.   Recent Labs: 02/08/2021: ALT 8; BUN 16; Creatinine, Ser 1.00; Potassium 4.1; Sodium 138; TSH 1.90 04/07/2021: Hemoglobin 15.4; Platelets 162.0   Lipid Panel    Component Value Date/Time   CHOL 154 02/08/2021 1134   CHOL 196 02/22/2018 1002   TRIG 190.0 (H) 02/08/2021 1134   HDL 38.20 (L) 02/08/2021 1134   HDL 44 02/22/2018 1002   CHOLHDL 4 02/08/2021 1134   VLDL 38.0 02/08/2021 1134   LDLCALC 78 02/08/2021 1134   LDLCALC 115 (H) 02/22/2018 1002   LDLDIRECT 120.5 09/30/2013 1400    Additional studies/ records that were reviewed today include:  ETT 04/21/16: Study Highlights   There was no ST segment deviation noted during stress.   Ambient PVC;s at rest, with stress and recovery No  significant NSVT No ischemia at 13.2 METS and 102% of PMHR   Echo: 04/21/16: Study Conclusions   - Left ventricle: The cavity size was normal. Wall thickness was   normal. Basal to mid inferior akinesis. Basal to mid   inferolateral akinesis. Anterolateral hypokinesis. Systolic   function was mildly to  moderately reduced. The estimated ejection   fraction was in the range of 40% to 45%. Doppler parameters are   consistent with abnormal left ventricular relaxation (grade 1   diastolic dysfunction). - Aortic valve: There was no stenosis. - Aorta: Mildly dilated aortic root. Aortic root dimension: 38 mm   (ED). - Mitral valve: Mildly calcified annulus. There was trivial   regurgitation. - Left atrium: The atrium was mildly dilated. - Right ventricle: The cavity size was normal. Systolic function   was mildly reduced. - Tricuspid valve: Peak RV-RA gradient (S): 16 mm Hg. - Pulmonary arteries: PA peak pressure: 19 mm Hg (S). - Inferior vena cava: The vessel was normal in size. The   respirophasic diameter changes were in the normal range (= 50%),   consistent with normal central venous pressure.   Impressions:   - Normal LV size with EF 40-45%. Wall motion abnormalities as noted   above. Normal RV size with mildly decreased systolic function. No   significant valvular abnormalities.  Myoview 11/10/17: Study Highlights    Nuclear stress EF: 30%. The left ventricular ejection fraction is moderately decreased (30-44%). Defect 1: There is a defect present in the basal inferolateral and mid inferolateral location. Findings consistent with prior myocardial infarction. This is a high risk study.   High risk stress nuclear study due to severely reduced global systolic function. There is evidence of an inferolateral scar (previously seen), but there is no reversible ischemia. There is global hypokinesis, worse in the inferolateral wall. LVEF has decreased slightly from 2016 (previously 36%), otherwise little change.   Echo 03/16/20: IMPRESSIONS     1. Global hypokinesis with akinesis of the inferobasal and inferolateral  walls; overall severe LV dysfunction; moderate LVE; mild LVH; grade 1  diastolic dysfunction; mildly dilated aortic root.   2. Left ventricular ejection fraction, by  estimation, is 25 to 30%. The  left ventricle has severely decreased function. The left ventricle  demonstrates regional wall motion abnormalities (see scoring  diagram/findings for description). The left  ventricular internal cavity size was moderately dilated. There is mild  left ventricular hypertrophy. Left ventricular diastolic parameters are  consistent with Grade I diastolic dysfunction (impaired relaxation).   3. Right ventricular systolic function is normal. The right ventricular  size is normal.   4. The mitral valve is normal in structure. Trivial mitral valve  regurgitation. No evidence of mitral stenosis.   5. The aortic valve is tricuspid. Aortic valve regurgitation is not  visualized. No aortic stenosis is present.   6. Aortic dilatation noted. There is mild dilatation of the aortic root  measuring 38 mm.   08/13/20: Event monitor: Study Highlights  Normal sinus rhythm Paroxysmal atrial fibrillation with rapid response and some aberrantion NSVT up to 5 beats     ASSESSMENT:    1. Chronic systolic heart failure (HCC)   2. Paroxysmal atrial fibrillation ([place])   3. Coronary artery disease involving native coronary artery of native heart without angina pectoris   4. Hypertensive heart disease with chronic systolic congestive heart failure ([place])   5. Hyperlipidemia LDL goal <70      PLAN:   1.  CAD: s/p  CABG 2000. Known atretic Vazquez to LAD but other grafts patent by cath in 2015.  -He is on a beta-blocker - he does have some left precordial chest pain but this is at baseline and more likely musculoskeletal.  - defers taking ASA because concerned about bleeding risk.   2.  Chronic systolic CHF: EF 123XX123 -He is on the carvedilol and Entresto, compliant with both. - intolerant  of aldactone 25 mg daily due to depression -  He did not tolerate a higher dose of Entresto. - refused ICD - will continue current therapy   3.  Hypertension: -BP is well controlled.     4.  Hyperlipidemia: -He is not on a statin, but is taking coenzyme Q 10 and niacin. -Target LDL is less than 70   5.  Paroxysmal Afib - increased symptoms.  Rate controlled on Coreg.  - notes from Dr [name] reviewed.  - [place] vasc score of 4- discussed recommendation for long term anticoagulation to reduce risk of embolic CVA. Doubt knee pain related to Eliquis but he is reluctant to take. - he would like to consider AAD therapy. Options with low EF include amiodarone or Tikosyn. After discussion he would like to go back on amiodarone. Will start 400 mg daily for one month then 200 mg daily. Return in 3 months for Ecg and labs.   6. Depression. Will start Zoloft 25 mg daily. May need to titrate dose to peak effect   Jovi Alvizo Martinique, MD ,[facility] 06/28/2021 4:03 PM    [facility] [address] [place], [place], [address] [phone]

## 2021-06-28 ENCOUNTER — Encounter: Payer: Self-pay | Admitting: Cardiology

## 2021-06-28 ENCOUNTER — Other Ambulatory Visit: Payer: Self-pay

## 2021-06-28 ENCOUNTER — Ambulatory Visit (INDEPENDENT_AMBULATORY_CARE_PROVIDER_SITE_OTHER): Payer: Medicare Other | Admitting: Cardiology

## 2021-06-28 ENCOUNTER — Telehealth: Payer: Medicare Other

## 2021-06-28 VITALS — BP 122/72 | HR 66 | Ht 73.0 in | Wt 198.0 lb

## 2021-06-28 DIAGNOSIS — I480 Paroxysmal atrial fibrillation: Secondary | ICD-10-CM

## 2021-06-28 DIAGNOSIS — I5022 Chronic systolic (congestive) heart failure: Secondary | ICD-10-CM | POA: Diagnosis not present

## 2021-06-28 DIAGNOSIS — I110 Hypertensive heart disease with heart failure: Secondary | ICD-10-CM

## 2021-06-28 DIAGNOSIS — I2510 Atherosclerotic heart disease of native coronary artery without angina pectoris: Secondary | ICD-10-CM | POA: Diagnosis not present

## 2021-06-28 DIAGNOSIS — E785 Hyperlipidemia, unspecified: Secondary | ICD-10-CM | POA: Diagnosis not present

## 2021-06-28 MED ORDER — SERTRALINE HCL 25 MG PO TABS
25.0000 mg | ORAL_TABLET | Freq: Every day | ORAL | 11 refills | Status: DC
Start: 2021-06-28 — End: 2022-05-30

## 2021-06-28 MED ORDER — AMIODARONE HCL 200 MG PO TABS: 400.0000 mg | ORAL_TABLET | Freq: Every day | ORAL | 3 refills | Status: DC

## 2021-06-28 MED ORDER — CARVEDILOL 12.5 MG PO TABS: 18.7500 mg | ORAL_TABLET | Freq: Two times a day (BID) | ORAL | 3 refills | Status: DC

## 2021-06-28 MED ORDER — ENTRESTO 24-26 MG PO TABS: 1.0000 | ORAL_TABLET | Freq: Two times a day (BID) | ORAL | 3 refills | Status: DC

## 2021-06-28 NOTE — Progress Notes (Deleted)
Chronic Care Management Pharmacy Note  06/28/2021 Name:  Keith Vazquez MRN:  [id number] DOB:  [birth year]  Summary: ***  Recommendations/Changes made from today's visit: ***  Plan: ***   Subjective: Keith Vazquez is an 70 y.o. year old male who is a primary patient of [name], MD.  The CCM team was consulted for assistance with disease management and care coordination needs.    {CCMTELEPHONEFACETOFACE:21091510} for {CCMINITIALFOLLOWUPCHOICE:21091511} in response to provider referral for pharmacy case management and/or care coordination services.   Consent to Services:  {CCMCONSENTOPTIONS:25074}  Patient Care Team: [name], MD as PCP - General (Internal Medicine) [place], Peter M, MD as PCP - Cardiology (Cardiology) Thompson Grayer, MD as PCP - Electrophysiology (Cardiology) Charlton Haws, [facility] (Pharmacist) Barrett, Evelene Croon, PA-C as Physician Assistant (Cardiology)  Recent office visits: ***  Recent consult visits: [facility] visits: {Hospital DC Yes/No:25215}   Objective:  Lab Results  Component Value Date   CREATININE 1.00 02/08/2021   BUN 16 02/08/2021   GFR 75.42 02/08/2021   GFRNONAA >60 04/24/2018   GFRAA >60 04/24/2018   NA 138 02/08/2021   K 4.1 02/08/2021   CALCIUM 9.8 02/08/2021   CO2 29 02/08/2021   GLUCOSE 93 02/08/2021    Lab Results  Component Value Date/Time   GFR 75.42 02/08/2021 11:34 AM   GFR 77.44 01/09/2019 09:57 AM    Last diabetic Eye exam: No results found for: HMDIABEYEEXA  Last diabetic Foot exam: No results found for: HMDIABFOOTEX   Lab Results  Component Value Date   CHOL 154 02/08/2021   HDL 38.20 (L) 02/08/2021   LDLCALC 78 02/08/2021   LDLDIRECT 120.5 09/30/2013   TRIG 190.0 (H) 02/08/2021   CHOLHDL 4 02/08/2021    Hepatic Function Latest Ref Rng & Units 02/08/2021 01/09/2019 05/28/2018  Total Protein 6.0 - 8.3 g/dL 7.2 7.0 7.4  Albumin 3.5 - 5.2 g/dL 4.1 4.3 4.4  AST 0 - 37 U/L 18 19  17   ALT 0 - 53 U/L 8 6 7   Alk Phosphatase 39 - 117 U/L 77 73 67  Total Bilirubin 0.2 - 1.2 mg/dL 0.7 0.4 0.5  Bilirubin, Direct 0.0 - 0.3 mg/dL 0.1 - -    Lab Results  Component Value Date/Time   TSH 1.90 02/08/2021 11:34 AM   TSH 1.31 11/30/2016 08:28 AM    CBC Latest Ref Rng & Units 04/07/2021 02/08/2021 01/09/2019  WBC 4.0 - 10.5 K/uL 6.7 11.0(H) 6.5  Hemoglobin 13.0 - 17.0 g/dL 15.4 14.9 15.5  Hematocrit 39.0 - 52.0 % 46.6 44.5 45.4  Platelets 150.0 - 400.0 K/uL 162.0 166.0 162.0    No results found for: VD25OH  Clinical ASCVD: {YES/NO:21197} The 10-year ASCVD risk score Mikey Bussing DC Jr., et al., 2013) is: 24.2%   Values used to calculate the score:     Age: 55 years     Sex: Male     Is Non-Hispanic African American: No     Diabetic: No     Tobacco smoker: No     Systolic Blood Pressure: 628 mmHg     Is BP treated: Yes     HDL Cholesterol: 38.2 mg/dL     Total Cholesterol: 154 mg/dL    Depression screen [facility] 2/9 06/25/2020 05/16/2019 12/04/2016  Decreased Interest 0 0 0  Down, Depressed, Hopeless 0 0 0  PHQ - 2 Score 0 0 0  Altered sleeping 0 - -  Tired, decreased energy 0 - -  Change  in appetite 0 - -  Feeling bad or failure about yourself  0 - -  Trouble concentrating 0 - -  Moving slowly or fidgety/restless 0 - -  Suicidal thoughts 0 - -  PHQ-9 Score 0 - -     ***Other: (CHADS2VASc if Afib, MMRC or CAT for COPD, ACT, DEXA)  Social History   Tobacco Use  Smoking Status Former   Packs/day: 1.00   Years: 20.00   Pack years: 20.00   Types: Cigarettes   Quit date: 10/31/1994   Years since quitting: 26.6  Smokeless Tobacco Never   BP Readings from Last 3 Encounters:  04/13/21 130/78  04/07/21 126/78  02/08/21 116/74   Pulse Readings from Last 3 Encounters:  04/13/21 64  04/07/21 68  02/08/21 68   Wt Readings from Last 3 Encounters:  04/13/21 202 lb 6.4 oz (91.8 kg)  04/07/21 202 lb (91.6 kg)  02/08/21 205 lb (93 kg)   BMI Readings from Last 3  Encounters:  04/13/21 25.99 kg/m  04/07/21 25.94 kg/m  02/08/21 26.32 kg/m    Assessment/Interventions: Review of patient past medical history, allergies, medications, health status, including review of consultants reports, laboratory and other test data, was performed as part of comprehensive evaluation and provision of chronic care management services.   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 Sig Documenting Provider Last Dose Status Informant  apixaban (ELIQUIS) 5 MG TABS tablet [id number] No Take 1 tablet (5 mg total) by mouth 2 (two) times daily. [name], MD Taking Active   Ascorbic Acid (VITAMIN C) 1000 MG tablet [id number] No Take 1,000 mg by mouth daily. [provider] Taking Active   b complex vitamins tablet [id number] No Take 1 tablet by mouth daily. [provider] Taking Active   carvedilol (COREG) 12.5 MG tablet [id number] No Take 1.5 tablets (18.75 mg total) by mouth 2 (two) times daily with a meal. Martinique, Peter M, MD Taking Active   cholecalciferol (VITAMIN D3) 25 MCG (1000 UNIT) tablet [id number] No Take 1,000 Units by mouth daily. [provider] Taking Active   co-enzyme Q-10 30 MG capsule [id number] No Take 30 mg by mouth daily. [provider] Taking Active   dicyclomine (BENTYL) 20 MG tablet [id number] No Take 20 mg by mouth 4 (four) times daily. [provider] Taking Active   gabapentin (NEURONTIN) 100 MG capsule [id number] No Take 2-3 capsules (200-300 mg total) by mouth at bedtime. Lyndal Pulley, DO Taking Active   niacin 500 MG tablet [id number] No Take 500 mg by mouth at bedtime. [provider] Taking Active   omeprazole (  PRILOSEC) 10 MG capsule [id number] No Take 1 capsule (10 mg total) by mouth daily. Esterwood, Amy S, PA-C Taking Active   predniSONE (DELTASONE) 50 MG tablet [id number] Yes Take 1 tablet (50 mg total) by mouth daily. Gregor Hams, MD  Active   Probiotic Product (PROBIOTIC-10 PO) [id number] No Take 1 capsule by mouth daily.  [provider] Taking Active   rosuvastatin (CRESTOR) 5 MG tablet [id number] No Take 1 tablet (5 mg total) by mouth daily. [name], MD Taking Active   sacubitril-valsartan [facility]) 24-26 MG [id number] No Take 1 tablet by mouth 2 (two) times daily. Martinique, Peter M, MD Taking Active   SUMAtriptan (IMITREX) 100 MG tablet [id number]  TAKE 1 TABLET AT ONSET OF MIGRAINE- MAY REPEAT ONCE IN 2 HOURS. LIMIT 2/24 HOURS. AVOID DAILY USE. [name], MD  Active   TURMERIC PO [id number] No Take 1 tablet by mouth daily. [provider] Taking Active   vitamin E 200 UNIT capsule [id number] No Take 200 Units by mouth daily. [provider] Taking Active Self            Patient Active Problem List   Diagnosis Date Noted   Primary osteoarthritis of both knees 04/07/2021   PAF (paroxysmal atrial fibrillation) ([place])    Spinal stenosis of lumbar region with radiculopathy 04/06/2020   DDD (degenerative disc disease), lumbar 04/06/2020   Irritable bowel syndrome with constipation 05/16/2019   Anal sphincter spasm 03/26/2019    Seasonal and perennial allergic rhinitis 07/05/2018   Routine general medical examination at a health care facility 12/04/2016   Migraine without aura and with status migrainosus, not intractable 11/29/2016   Erectile dysfunction due to arterial insufficiency 11/29/2016   BPH (benign prostatic hyperplasia) 02/24/2015   Bilateral carpal tunnel syndrome 80/12/4915   Chronic systolic heart failure ([place]) 11/28/2013   Coronary artery disease    PUD (peptic ulcer disease) 07/22/2013   Hyperlipidemia with target low density lipoprotein (LDL) cholesterol less than 70 mg/dL 08/11/2010   ISCHEMIC CARDIOMYOPATHY 08/11/2010   Essential hypertension 08/10/2010   GERD 04/23/2008    Immunization History  Administered Date(s) Administered   Fluad Quad(high Dose 65+) 08/23/2019   Influenza Split 09/30/2014   Influenza, High Dose Seasonal PF 09/11/2015, 09/16/2016, 09/12/2017, 09/10/2018   Influenza-Unspecified 09/14/2020   PFIZER(Purple Top)SARS-COV-2 Vaccination 12/06/2019, 12/27/2019   Pneumococcal Conjugate-13 05/16/2019   Pneumococcal Polysaccharide-23 04/07/2021   Pneumococcal-Unspecified 07/22/2012   Tdap 05/16/2019   Zoster, Live 05/22/2011    Conditions to be addressed/monitored:  {USCCMDZASSESSMENTOPTIONS:23563}  There are no care plans that you recently modified to display for this patient.    Medication Assistance: {MEDASSISTANCEINFO:25044}  Compliance/Adherence/Medication fill history: Care Gaps: ***  Star-Rating Drugs: ***  Patient's preferred pharmacy is:  [place], [place] [address] Phone: [phone] Fax: [phone]  Uses pill box? {Yes or If no, why not?:20788} Pt endorses ***% compliance  We discussed: {Pharmacy options:24294} Patient decided to: {US Pharmacy Plan:23885}  Care Plan and Follow Up Patient Decision:  {FOLLOWUP:24991}  Plan: {CM FOLLOW UP  GBEE:10071}  ***    Current Barriers:  {pharmacybarriers:24917}  Pharmacist Clinical Goal(s):  Patient will {PHARMACYGOALCHOICES:24921} through collaboration with PharmD and provider.   Interventions: 1:1 collaboration with [name], MD regarding development and update of comprehensive plan of care as evidenced by provider attestation and co-signature Inter-disciplinary care team collaboration (see longitudinal plan of care) Comprehensive medication review  performed; medication list updated in electronic medical record  Heart Failure / Hypertension    HF type: Systolic Last ejection fraction: 40-45% (11/06/2017) NYHA Class: I (no actitivty limitation) AHA HF Stage: B (Heart disease present - no symptoms present)   Patient checks BP at home weekly Patient home BP readings are ranging: "good"   Patient has failed these meds in past: amiodarone, lisinopril, ramipril   Patient is currently controlled on the following medications:  carvedilol 12.5 mg BID Entresto 24-26 mg BID   We discussed diet and exercise extensively, pt reports issues with certain lots of carvedilol at pharmacy - he does best with Zeta brand. Other brands cause him to feel dizzy upon standing, and get winded going up stairs - per [facility] the other brand they carry is [employer]. Entresto also made him feel dizzy at higher dose.   Pt is interested in finding the best possible drug/manufacturer that helps his sx the most. He is doing well with the current regimen but wants to try other options to see if they work even better. Pt has appt with cardiologist this week, plans to discuss then.   Plan: Continue current medications and control with diet and exercise    Hyperlipidemia/CAD   LDL goal < 70 Per cardiology (11/02/2017): pt is reluctant to take statins due to concern over side effects. As long as LDL is at goal cardiologist is ok with this.    Patient has failed these meds in past: pravastatin,  rosuvastatin, aspirin, fish oil Patient is currently controlled on the following medications:  CoQ10 Niacin   We discussed:  diet and exercise extensively. Pt is no longer taking aspirin - he used to take a lot of aspirin for headaches, and had a GI bleed because of it, so he is wary of aspirin. Discussed benefits of aspirin after CABG - pt has been very stable since surgery 21 years ago, recommended that he discuss aspirin with cardiologist. He also quit taking fish oil 2 mos ago, has not had headaches since.   Plan: Continue current medications and control with diet and exercise   Anxiety   Patient has failed these meds in past: *** Patient is currently {CHL Controlled/Uncontrolled:[phone]} on the following medications:  Escitalopram 5 mg daily  We discussed:  ***  Plan  Continue {CHL [facility] ACZYS:[id number]}  GERD/PUD    Patient has failed these meds in past: n/a Patient is currently controlled on the following medications:  omeprazole 10 mg PRN,  dicyclomine 10 mg QD,  probiotic   We discussed:  Pt takes omeprazole and dicyclomine prn, once he started taking a probiotic his GI issues have vastly improved.   Plan: Continue current medications    Osteoarthritis    Patient has failed these meds in past: n/a Patient is currently controlled on the following medications:  celecoxib 200 mg (not filled recently)   We discussed:  Pt uses celebrex very sparingly for back pain.   Plan: Continue current medications    Headaches    Patient has failed these meds in past: amitriptyline Patient is currently controlled on the following medications:  sumatriptan 100 mg PRN   We discussed:  Pt reports he's had headaches all his life, about 6-7 years they got worse and more frequent, sumatriptan stops HA very well. Takes 1/3 of tablet to stop HA. Rarely takes 2/3 within 24 hrs. He reports he has been to migraine specialists and he does not believe he has migraines.  He  noticed Enresto helps prevent his headaches, and he has not had headaches in 2 months since stopping fish oil.    Plan: Continue current medications    Patient Goals/Self-Care Activities Patient will:  - {pharmacypatientgoals:24919}

## 2021-06-28 NOTE — Patient Instructions (Signed)
Do not take lexapro  Start Zoloft 25 mg daily  Start amiodarone 400 mg daily for 4 weeks then reduce to 200 mg daily  Follow up in 3 months with lab work

## 2021-06-29 ENCOUNTER — Encounter (HOSPITAL_COMMUNITY): Payer: Self-pay

## 2021-06-29 ENCOUNTER — Ambulatory Visit (HOSPITAL_COMMUNITY): Payer: Medicare Other | Admitting: Physician Assistant

## 2021-07-06 NOTE — Telephone Encounter (Signed)
Pt is scheduled for a visit w/ Dr. Georgina Snell on 07/08/21.

## 2021-07-06 NOTE — Telephone Encounter (Signed)
Called pt to get him scheduled for his first gel shot w/ Dr. Georgina Snell, but there was no answer. Left VM for him to call the office to get scheduled for a visit at his convenience.

## 2021-07-08 ENCOUNTER — Ambulatory Visit (INDEPENDENT_AMBULATORY_CARE_PROVIDER_SITE_OTHER): Payer: Medicare Other | Admitting: Family Medicine

## 2021-07-08 ENCOUNTER — Ambulatory Visit: Payer: Self-pay

## 2021-07-08 ENCOUNTER — Other Ambulatory Visit: Payer: Self-pay

## 2021-07-08 DIAGNOSIS — M25561 Pain in right knee: Secondary | ICD-10-CM

## 2021-07-08 DIAGNOSIS — G8929 Other chronic pain: Secondary | ICD-10-CM

## 2021-07-08 DIAGNOSIS — M1711 Unilateral primary osteoarthritis, right knee: Secondary | ICD-10-CM

## 2021-07-08 NOTE — Patient Instructions (Signed)
Good to see you today.  You had a R knee Orthovisc injection.  Call or go to the ER if you develop a large red swollen joint with extreme pain or oozing puss.   Please make sure you have 2 follow-up visits scheduled (1 each week for the next 2 weeks) to get your 2nd and 3rd injections in the series.

## 2021-07-08 NOTE — Progress Notes (Signed)
Keith Vazquez presents to clinic today for rt knee Orthovisc #1/3  Procedure: Real-time Ultrasound Guided Injection of right knee superior lateral patellar space Device: Philips Affiniti 50G Images permanently stored and available for review in PACS Verbal informed consent obtained.  Discussed risks and benefits of procedure. Warned about infection bleeding damage to structures skin hypopigmentation and fat atrophy among others. Patient expresses understanding and agreement Time-out conducted.   Noted no overlying erythema, induration, or other signs of local infection.   Skin prepped in a sterile fashion.   Local anesthesia: Topical Ethyl chloride.   With sterile technique and under real time ultrasound guidance: Orthovisc 30 mg injected into the joint. Fluid seen entering the joint capsule.   Completed without difficulty    Advised to call if fevers/chills, erythema, induration, drainage, or persistent bleeding.   Images permanently stored and available for review in the ultrasound unit.  Impression: Technically successful ultrasound guided injection.  Lot Number: [id number]  Return in 1 week for Orthovisc injection right knee 2/3

## 2021-07-15 ENCOUNTER — Other Ambulatory Visit: Payer: Self-pay

## 2021-07-15 ENCOUNTER — Ambulatory Visit: Payer: Self-pay

## 2021-07-15 ENCOUNTER — Ambulatory Visit (INDEPENDENT_AMBULATORY_CARE_PROVIDER_SITE_OTHER): Payer: Medicare Other | Admitting: Family Medicine

## 2021-07-15 DIAGNOSIS — G8929 Other chronic pain: Secondary | ICD-10-CM | POA: Diagnosis not present

## 2021-07-15 DIAGNOSIS — M1711 Unilateral primary osteoarthritis, right knee: Secondary | ICD-10-CM | POA: Diagnosis not present

## 2021-07-15 DIAGNOSIS — M25561 Pain in right knee: Secondary | ICD-10-CM

## 2021-07-15 NOTE — Progress Notes (Signed)
Keith Vazquez presents to clinic today for Orthovisc injection right knee 2/3  Procedure: Real-time Ultrasound Guided Injection of right knee superior lateral patellar space Device: Philips Affiniti 50G Images permanently stored and available for review in PACS Verbal informed consent obtained.  Discussed risks and benefits of procedure. Warned about infection bleeding damage to structures skin hypopigmentation and fat atrophy among others. Patient expresses understanding and agreement Time-out conducted.   Noted no overlying erythema, induration, or other signs of local infection.   Skin prepped in a sterile fashion.   Local anesthesia: Topical Ethyl chloride.   With sterile technique and under real time ultrasound guidance: Orthovisc 30 mg injected into knee joint. Fluid seen entering the joint capsule.   Completed without difficulty   Advised to call if fevers/chills, erythema, induration, drainage, or persistent bleeding.   Images permanently stored and available for review in the ultrasound unit.  Impression: Technically successful ultrasound guided injection.  Lot WR:[id number]  Return 1 week for Orthovisc injection right knee 3/3

## 2021-07-15 NOTE — Patient Instructions (Addendum)
Thank you for coming in today.   Call or go to the ER if you develop a large red swollen joint with extreme pain or oozing puss.    We will see you next week for the 3rd Orthovisc injection.

## 2021-07-22 ENCOUNTER — Other Ambulatory Visit: Payer: Self-pay

## 2021-07-22 ENCOUNTER — Ambulatory Visit: Payer: Self-pay

## 2021-07-22 ENCOUNTER — Ambulatory Visit (INDEPENDENT_AMBULATORY_CARE_PROVIDER_SITE_OTHER): Payer: Medicare Other | Admitting: Family Medicine

## 2021-07-22 DIAGNOSIS — G8929 Other chronic pain: Secondary | ICD-10-CM

## 2021-07-22 DIAGNOSIS — M5416 Radiculopathy, lumbar region: Secondary | ICD-10-CM

## 2021-07-22 DIAGNOSIS — M48061 Spinal stenosis, lumbar region without neurogenic claudication: Secondary | ICD-10-CM

## 2021-07-22 DIAGNOSIS — M1711 Unilateral primary osteoarthritis, right knee: Secondary | ICD-10-CM

## 2021-07-22 DIAGNOSIS — I2510 Atherosclerotic heart disease of native coronary artery without angina pectoris: Secondary | ICD-10-CM | POA: Diagnosis not present

## 2021-07-22 DIAGNOSIS — M25561 Pain in right knee: Secondary | ICD-10-CM

## 2021-07-22 NOTE — Patient Instructions (Signed)
Good to see you today.  You had a R knee Orthovisc 3/3.  Call or go to the ER if you develop a large red swollen joint with extreme pain or oozing puss.   Follow-up as needed.

## 2021-07-22 NOTE — Progress Notes (Signed)
Keith Vazquez presents to clinic today for Orthovisc injection right knee 3/3  Procedure: Real-time Ultrasound Guided Injection of right knee superior lateral patellar space Device: Philips Affiniti 50G Images permanently stored and available for review in PACS Verbal informed consent obtained.  Discussed risks and benefits of procedure. Warned about infection bleeding damage to structures skin hypopigmentation and fat atrophy among others. Patient expresses understanding and agreement Time-out conducted.   Noted no overlying erythema, induration, or other signs of local infection.   Skin prepped in a sterile fashion.   Local anesthesia: Topical Ethyl chloride.   With sterile technique and under real time ultrasound guidance: Orthovisc 30 mg injected into knee joint. Fluid seen entering the joint capsule.   Completed without difficulty    Advised to call if fevers/chills, erythema, induration, drainage, or persistent bleeding.   Images permanently stored and available for review in the ultrasound unit.  Impression: Technically successful ultrasound guided injection.  Lot number: [id number]  Return as needed  While discussing his knee Keith Vazquez wanted to talk about his back as well.  He notes chronic back pain attributable to degenerative disc disease.  He wanted to know if there is any exercises that could be done to improve his back pain.  I reviewed his MRI obtained on July 2021 which did show significant DDD L5-S1 as well as other multilevel degenerative changes.  I stated that he is a good candidate for trial of physical therapy.  He asked for a good physical therapist and I recommended Lyndee Hensen at [facility].  I placed a referral.  Recommend dedicated visit for further evaluation and treatment for back pain. Total time for the visit excluding time to perform the injection was 10 minutes

## 2021-08-13 ENCOUNTER — Other Ambulatory Visit: Payer: Self-pay

## 2021-08-13 ENCOUNTER — Ambulatory Visit (INDEPENDENT_AMBULATORY_CARE_PROVIDER_SITE_OTHER): Payer: Medicare Other | Admitting: Physical Therapy

## 2021-08-13 DIAGNOSIS — G8929 Other chronic pain: Secondary | ICD-10-CM | POA: Diagnosis not present

## 2021-08-13 DIAGNOSIS — M25561 Pain in right knee: Secondary | ICD-10-CM | POA: Diagnosis not present

## 2021-08-13 DIAGNOSIS — M545 Low back pain, unspecified: Secondary | ICD-10-CM

## 2021-08-13 NOTE — Patient Instructions (Signed)
Access Code: [email or web address] URL: [email or web address] Date: 08/13/2021 Prepared by: Lyndee Hensen  Exercises Supine Posterior Pelvic Tilt - 2 x daily - 1 sets - 10 reps Supine Transversus Abdominis Bracing - Hands on Stomach - 2 x daily - 1 sets - 10 reps - 5 hold Single Knee to Chest Stretch - 2 x daily - 3 reps - 30 hold Seated Hamstring Stretch - 2 x daily - 3 reps - 30 hold

## 2021-08-15 ENCOUNTER — Encounter: Payer: Self-pay | Admitting: Physical Therapy

## 2021-08-15 NOTE — Therapy (Signed)
[facility] [address] [place], [place], [address] Phone: [phone]   Fax:  [phone]  Physical Therapy Evaluation  Patient Details  Name: Keith Vazquez MRN: [id number] Date of Birth: 1951/02/23 Referring Provider (PT): Lynne Leader   Encounter Date: 08/13/2021   PT End of Session - 08/15/21 2049     Visit Number 1    Number of Visits 12    Date for PT Re-Evaluation 09/24/21    Authorization Type Medicare    PT Start Time 1105    PT Stop Time 1145    PT Time Calculation (min) 40 min    Activity Tolerance Patient tolerated treatment well    Behavior During Therapy [facility] for tasks assessed/performed             Past Medical History:  Diagnosis Date   Atrial flutter ([place])    CHF (congestive heart failure) ([place])    LVEF at time of cath 2002 50%; now has been 35-40% since 2008 (patient has seen multiple cardiologists over the years including [facility], [place], [facility], [facility] and Tollie Eth, MD)   Coronary artery disease    CABG x5 in 2000; s/p cath 2002 > 4/5 grafts patent, LVEF 50%   Degeneration of lumbar intervertebral disc    GERD (gastroesophageal reflux disease)    GI bleed    duodenal ulcer   Hypertension    Borderline   Ischemic heart disease    s/p MI, CABG 2000, s/p cath 2002 with 4/5 grafts patent (LIMA to LAD atretic and occluded mid vessel but adequate flow to distal LAD from collaterals / diagonal   Low back pain    PAC (premature atrial contraction)    PAF (paroxysmal atrial fibrillation) ([place])    initially diagnosed during stress test 2002   Peyronie's disease     Past Surgical History:  Procedure Laterality Date   ABLATION  05-06-2014   PVI and CTI ablation by Dr [name]   ATRIAL FIBRILLATION ABLATION N/A 05/06/2014   Procedure: ATRIAL FIBRILLATION ABLATION;  Surgeon: [name], MD;  Location: [facility] CATH LAB;  Service: Cardiovascular;  Laterality: N/A;    CARDIAC CATHETERIZATION  09/14/2001    Mildly decreased left ventricular systolic function --  Native three vessel coronary artery disease as described. -- Status post coronary artery bypass grafting   CORONARY ARTERY BYPASS GRAFT  2000   LIMA to LAD, SVG to second diagonal, seq SVG to ramus and OM, SVG to PDA   HERNIA REPAIR  05/16/2001   Large left indirect inguinal hernia with right direct hernia.   HERNIA REPAIR     Recurrent left inguinal hernia -- Large left indirect inguinal hernia with right direct hernia.   LEFT HEART CATHETERIZATION WITH CORONARY/GRAFT ANGIOGRAM  11/26/2013   Procedure: LEFT HEART CATHETERIZATION WITH [name];  Surgeon: Peter M Martinique, MD;  Location: [facility] CATH LAB;  Service: Cardiovascular;;   TEE WITHOUT CARDIOVERSION N/A 05/06/2014   Procedure: TRANSESOPHAGEAL ECHOCARDIOGRAM (TEE);  Surgeon: [name], MD;  Location: [facility];  Service: Cardiovascular;  Laterality: N/A;    There were no vitals filed for this visit.    Subjective Assessment - 08/15/21 2037     Subjective Pt stats back pain and R knee pain. He just had 3rd injection in R knee. Pain doing much better, but still has some soreness. States Back pain forever: worked as Dealer, still does work some. Has had several injections, has not gotten significant  or lasting relief.  Pain has been on/off, but 1 yr ago notes increased pain. Current agg factors: standing, walking, sitting.    Pertinent History CHF, CAD, CABG,    Limitations Sitting;Lifting;Standing;Walking;House hold activities    Patient Stated Goals Learn exercises for back, strengthening, posture,    Currently in Pain? Yes    Pain Score 3     Pain Location Back    Pain Orientation Right    Pain Descriptors / Indicators Aching    Pain Type Chronic pain    Pain Onset More than a month ago    Pain Frequency Intermittent    Aggravating Factors  sitting, standing, transfers, walkng.                [facility] PT  Assessment - 08/15/21 0001       Assessment   Medical Diagnosis Back pain/ R knee pain    Referring Provider (PT) Lynne Leader    Prior Therapy no      Precautions   Precautions None      Balance Screen   Has the patient fallen in the past 6 months No      Prior Function   Level of Independence Independent      Cognition   Overall Cognitive Status Within Functional Limits for tasks assessed      ROM / Strength   AROM / PROM / Strength AROM;Strength      AROM   Overall AROM Comments Lumbar Flex: mild limitation: Ext: mild/mod limitation; SB: mild limitation;  Hips: mild limitation for rotation,  Knee: very mild limitation for flexion on R vs L at end range, but still WNL.      Strength   Overall Strength Comments Hips: 4/5, L knee: 5/5, R knee: 4/5      Palpation   Palpation comment palpable and audible crepitus at R knee/patella with AROM;  minimal pain in low back with palpation,      Special Tests   Other special tests Neg SLR,                        Objective measurements completed on examination: See above findings.       [facility] Adult PT Treatment/Exercise - 08/15/21 0001       Exercises   Exercises Lumbar      Lumbar Exercises: Stretches   Active Hamstring Stretch 3 reps;30 seconds    Active Hamstring Stretch Limitations seated    Single Knee to Chest Stretch 3 reps;30 seconds    Pelvic Tilt 10 reps      Lumbar Exercises: Supine   Ab Set 10 reps                     PT Education - 08/15/21 2049     Education Details PT POC, Exam findings, HEP    Person(s) Educated Patient    Methods Explanation;Demonstration;Tactile cues;Verbal cues;Handout    Comprehension Verbalized understanding;Returned demonstration;Verbal cues required;Tactile cues required;Need further instruction              PT Short Term Goals - 08/15/21 2127       PT SHORT TERM GOAL #1   Title Pt to be independent with initial HEP    Time 2    Period  Weeks    Status New    Target Date 08/27/21               PT Long Term Goals -  08/15/21 2127       PT LONG TERM GOAL #1   Title Pt to be independent with final HEP    Time 6    Period Weeks    Status New    Target Date 09/24/21      PT LONG TERM GOAL #2   Title Pt to report decreased pain in low back to 0-2/10 with activity    Time 6    Period Weeks    Status New    Target Date 09/24/21      PT LONG TERM GOAL #3   Title Pt to demo ability for optimal bend, lift, squat mechanis,with at least 25 lb, to improve ability for funcitonal activity and work duties    Time 6    Period Weeks    Status New    Target Date 09/24/21                    Plan - 08/15/21 2116     Clinical Impression Statement Pt presents wtih primary complaint of ongoing pain in low back, symptoms consistent with degenerative changes seen on recent MRI. Pt with increased pain with standing, walking, sitting, and most functional activities. He also has mild pain in R knee, somewhat improved with recent injections. He has had ongoing pain, but has lack of effective HEP for his dx. He is active at the gym, but will benefit from education on best exercise to target back and knee pain.    Personal Factors and Comorbidities Time since onset of injury/illness/exacerbation;Past/Current Experience    Examination-Activity Limitations Sit;Sleep;Bend;Squat;Stairs;Stand;Transfers;Lift    Examination-Participation Restrictions Cleaning;Community Activity;Driving;Yard Work    Stability/Clinical Decision Making Stable/Uncomplicated    Designer, jewellery Low    Rehab Potential Fair    PT Frequency 2x / week    PT Duration 6 weeks    PT Treatment/Interventions ADLs/Self Care Home Management;Cryotherapy;Electrical Stimulation;Moist Heat;Traction;Balance training;Therapeutic exercise;Functional mobility training;Manual techniques;Therapeutic activities;Iontophoresis 4mg /ml Dexamethasone;Stair training;Gait  training;DME Instruction;Ultrasound;Neuromuscular re-education;Patient/family education;Taping;Passive range of motion;Dry needling;Energy conservation;Joint Manipulations;Spinal Manipulations;Orthotic Fit/Training    PT Home Exercise Plan [id number]    Consulted and Agree with Plan of Care Patient             Patient will benefit from skilled therapeutic intervention in order to improve the following deficits and impairments:  Decreased activity tolerance, Decreased strength, Pain, Decreased mobility, Increased muscle spasms, Decreased range of motion, Improper body mechanics, Impaired flexibility  Visit Diagnosis: Chronic bilateral low back pain without sciatica  Chronic pain of right knee     Problem List Patient Active Problem List   Diagnosis Date Noted   Primary osteoarthritis of both knees 04/07/2021   PAF (paroxysmal atrial fibrillation) ([place])    Spinal stenosis of lumbar region with radiculopathy 04/06/2020   DDD (degenerative disc disease), lumbar 04/06/2020   Irritable bowel syndrome with constipation 05/16/2019   Anal sphincter spasm 03/26/2019   Seasonal and perennial allergic rhinitis 07/05/2018   Routine general medical examination at a health care facility 12/04/2016   Migraine without aura and with status migrainosus, not intractable 11/29/2016   Erectile dysfunction due to arterial insufficiency 11/29/2016   BPH (benign prostatic hyperplasia) 02/24/2015   Bilateral carpal tunnel syndrome 38/18/2993   Chronic systolic heart failure ([place]) 11/28/2013   Coronary artery disease    PUD (peptic ulcer disease) 07/22/2013   Hyperlipidemia with target low density lipoprotein (LDL) cholesterol less than 70 mg/dL 08/11/2010   ISCHEMIC CARDIOMYOPATHY 08/11/2010   Essential hypertension 08/10/2010  GERD 04/23/2008   Lyndee Hensen, PT, DPT 9:30 PM  08/15/21    [facility] [place], [place], [address] Phone:  [phone]   Fax:  [phone]  Name: Keith Vazquez MRN: [id number] Date of Birth: 1951-08-11

## 2021-08-19 ENCOUNTER — Ambulatory Visit (INDEPENDENT_AMBULATORY_CARE_PROVIDER_SITE_OTHER): Payer: Medicare Other | Admitting: Physical Therapy

## 2021-08-19 ENCOUNTER — Other Ambulatory Visit: Payer: Self-pay

## 2021-08-19 ENCOUNTER — Ambulatory Visit (INDEPENDENT_AMBULATORY_CARE_PROVIDER_SITE_OTHER): Payer: Medicare Other

## 2021-08-19 DIAGNOSIS — M545 Low back pain, unspecified: Secondary | ICD-10-CM

## 2021-08-19 DIAGNOSIS — Z23 Encounter for immunization: Secondary | ICD-10-CM

## 2021-08-19 DIAGNOSIS — G8929 Other chronic pain: Secondary | ICD-10-CM | POA: Diagnosis not present

## 2021-08-19 DIAGNOSIS — M25561 Pain in right knee: Secondary | ICD-10-CM | POA: Diagnosis not present

## 2021-08-22 ENCOUNTER — Encounter: Payer: Self-pay | Admitting: Physical Therapy

## 2021-08-22 NOTE — Therapy (Signed)
[facility] [address] [place], [place], [address] Phone: [phone]   Fax:  [phone]  Physical Therapy Treatment  Patient Details  Name: Keith Vazquez MRN: [id number] Date of Birth: 12-28-1950 Referring Provider (PT): Lynne Leader   Encounter Date: 08/19/2021   PT End of Session - 08/22/21 2124     Visit Number 2    Number of Visits 12    Date for PT Re-Evaluation 09/24/21    Authorization Type Medicare    PT Start Time 1300    PT Stop Time 9528    PT Time Calculation (min) 43 min    Activity Tolerance Patient tolerated treatment well    Behavior During Therapy [facility] for tasks assessed/performed             Past Medical History:  Diagnosis Date   Atrial flutter ([place])    CHF (congestive heart failure) ([place])    LVEF at time of cath 2002 50%; now has been 35-40% since 2008 (patient has seen multiple cardiologists over the years including [facility], [place], [facility], [facility] and Tollie Eth, MD)   Coronary artery disease    CABG x5 in 2000; s/p cath 2002 > 4/5 grafts patent, LVEF 50%   Degeneration of lumbar intervertebral disc    GERD (gastroesophageal reflux disease)    GI bleed    duodenal ulcer   Hypertension    Borderline   Ischemic heart disease    s/p MI, CABG 2000, s/p cath 2002 with 4/5 grafts patent (LIMA to LAD atretic and occluded mid vessel but adequate flow to distal LAD from collaterals / diagonal   Low back pain    PAC (premature atrial contraction)    PAF (paroxysmal atrial fibrillation) ([place])    initially diagnosed during stress test 2002   Peyronie's disease     Past Surgical History:  Procedure Laterality Date   ABLATION  05-06-2014   PVI and CTI ablation by Dr [name]   ATRIAL FIBRILLATION ABLATION N/A 05/06/2014   Procedure: ATRIAL FIBRILLATION ABLATION;  Surgeon: [name], MD;  Location: [facility] CATH LAB;  Service: Cardiovascular;  Laterality: N/A;    CARDIAC CATHETERIZATION  09/14/2001    Mildly decreased left ventricular systolic function --  Native three vessel coronary artery disease as described. -- Status post coronary artery bypass grafting   CORONARY ARTERY BYPASS GRAFT  2000   LIMA to LAD, SVG to second diagonal, seq SVG to ramus and OM, SVG to PDA   HERNIA REPAIR  05/16/2001   Large left indirect inguinal hernia with right direct hernia.   HERNIA REPAIR     Recurrent left inguinal hernia -- Large left indirect inguinal hernia with right direct hernia.   LEFT HEART CATHETERIZATION WITH CORONARY/GRAFT ANGIOGRAM  11/26/2013   Procedure: LEFT HEART CATHETERIZATION WITH [name];  Surgeon: Peter M Martinique, MD;  Location: [facility] CATH LAB;  Service: Cardiovascular;;   TEE WITHOUT CARDIOVERSION N/A 05/06/2014   Procedure: TRANSESOPHAGEAL ECHOCARDIOGRAM (TEE);  Surgeon: [name], MD;  Location: [facility];  Service: Cardiovascular;  Laterality: N/A;    There were no vitals filed for this visit.   Subjective Assessment - 08/22/21 2124     Subjective Pt states continued soreness in back. Has been doing HEP    Currently in Pain? Yes    Pain Score 3     Pain Location Back    Pain Orientation Right    Pain Descriptors / Indicators Aching  Pain Type Chronic pain    Pain Onset More than a month ago    Pain Frequency Intermittent                               [facility] Adult PT Treatment/Exercise - 08/22/21 0001       Exercises   Exercises Lumbar      Lumbar Exercises: Stretches   Active Hamstring Stretch 3 reps;30 seconds    Active Hamstring Stretch Limitations seated    Single Knee to Chest Stretch 3 reps;30 seconds    Double Knee to Chest Stretch 3 reps;30 seconds    Lower Trunk Rotation 5 reps;10 seconds    Lower Trunk Rotation Limitations with top leg extension    Pelvic Tilt 10 reps    Pelvic Tilt Limitations soreness    Piriformis Stretch 3 reps;30 seconds    Piriformis Stretch  Limitations supine and seated      Lumbar Exercises: Aerobic   Recumbent Bike L2 x 8 min      Lumbar Exercises: Supine   Ab Set 10 reps    Clam 20 reps    Clam Limitations Blue TB x 20 with TA    Bent Knee Raise 20 reps    Bent Knee Raise Limitations with TA    Bridge 20 reps                       PT Short Term Goals - 08/15/21 2127       PT SHORT TERM GOAL #1   Title Pt to be independent with initial HEP    Time 2    Period Weeks    Status New    Target Date 08/27/21               PT Long Term Goals - 08/15/21 2127       PT LONG TERM GOAL #1   Title Pt to be independent with final HEP    Time 6    Period Weeks    Status New    Target Date 09/24/21      PT LONG TERM GOAL #2   Title Pt to report decreased pain in low back to 0-2/10 with activity    Time 6    Period Weeks    Status New    Target Date 09/24/21      PT LONG TERM GOAL #3   Title Pt to demo ability for optimal bend, lift, squat mechanis,with at least 25 lb, to improve ability for funcitonal activity and work duties    Time 6    Period Weeks    Status New    Target Date 09/24/21                   Plan - 08/22/21 2126     Clinical Impression Statement Initial HEP reviewed and updated today. Focus on education on best flexion stretches for mobility and HEP today. Pt to benefit from progression of ther ex with light strengthening, as able.    Personal Factors and Comorbidities Time since onset of injury/illness/exacerbation;Past/Current Experience    Examination-Activity Limitations Sit;Sleep;Bend;Squat;Stairs;Stand;Transfers;Lift    Examination-Participation Restrictions Cleaning;Community Activity;Driving;Yard Work    Stability/Clinical Decision Making Stable/Uncomplicated    Rehab Potential Fair    PT Frequency 2x / week    PT Duration 6 weeks    PT Treatment/Interventions ADLs/Self Care Home Management;Cryotherapy;Electrical Stimulation;Moist Heat;Traction;Balance  training;Therapeutic exercise;Functional mobility training;Manual techniques;Therapeutic activities;Iontophoresis 4mg /ml Dexamethasone;Stair training;Gait training;DME Instruction;Ultrasound;Neuromuscular re-education;Patient/family education;Taping;Passive range of motion;Dry needling;Energy conservation;Joint Manipulations;Spinal Manipulations;Orthotic Fit/Training    PT Home Exercise Plan [id number]    Consulted and Agree with Plan of Care Patient             Patient will benefit from skilled therapeutic intervention in order to improve the following deficits and impairments:  Decreased activity tolerance, Decreased strength, Pain, Decreased mobility, Increased muscle spasms, Decreased range of motion, Improper body mechanics, Impaired flexibility  Visit Diagnosis: Chronic bilateral low back pain without sciatica  Chronic pain of right knee     Problem List Patient Active Problem List   Diagnosis Date Noted   Primary osteoarthritis of both knees 04/07/2021   PAF (paroxysmal atrial fibrillation) ([place])    Spinal stenosis of lumbar region with radiculopathy 04/06/2020   DDD (degenerative disc disease), lumbar 04/06/2020   Irritable bowel syndrome with constipation 05/16/2019   Anal sphincter spasm 03/26/2019   Seasonal and perennial allergic rhinitis 07/05/2018   Routine general medical examination at a health care facility 12/04/2016   Migraine without aura and with status migrainosus, not intractable 11/29/2016   Erectile dysfunction due to arterial insufficiency 11/29/2016   BPH (benign prostatic hyperplasia) 02/24/2015   Bilateral carpal tunnel syndrome 16/08/9603   Chronic systolic heart failure ([place]) 11/28/2013   Coronary artery disease    PUD (peptic ulcer disease) 07/22/2013   Hyperlipidemia with target low density lipoprotein (LDL) cholesterol less than 70 mg/dL 08/11/2010   ISCHEMIC CARDIOMYOPATHY 08/11/2010   Essential hypertension 08/10/2010   GERD 04/23/2008    Lyndee Hensen, PT, DPT 9:31 PM  08/22/21   [facility] [address] [place], [place], [address] Phone: [phone]   Fax:  [phone]  Name: Keith Vazquez MRN: [id number] Date of Birth: 10/21/1951

## 2021-08-26 ENCOUNTER — Ambulatory Visit (INDEPENDENT_AMBULATORY_CARE_PROVIDER_SITE_OTHER): Payer: Medicare Other | Admitting: Physical Therapy

## 2021-08-26 ENCOUNTER — Encounter: Payer: Self-pay | Admitting: Physical Therapy

## 2021-08-26 ENCOUNTER — Other Ambulatory Visit: Payer: Self-pay

## 2021-08-26 DIAGNOSIS — M25561 Pain in right knee: Secondary | ICD-10-CM

## 2021-08-26 DIAGNOSIS — M545 Low back pain, unspecified: Secondary | ICD-10-CM | POA: Diagnosis not present

## 2021-08-26 DIAGNOSIS — G8929 Other chronic pain: Secondary | ICD-10-CM | POA: Diagnosis not present

## 2021-08-26 NOTE — Therapy (Signed)
[facility] [address] [place], [place], [address] Phone: [phone]   Fax:  [phone]  Physical Therapy Treatment  Patient Details  Name: Keith Vazquez MRN: [id number] Date of Birth: Apr 24, 1951 Referring Provider (PT): Lynne Leader   Encounter Date: 08/26/2021   PT End of Session - 08/26/21 1341     Visit Number 3    Number of Visits 12    Date for PT Re-Evaluation 09/24/21    Authorization Type Medicare    PT Start Time 1305    PT Stop Time 1345    PT Time Calculation (min) 40 min    Activity Tolerance Patient tolerated treatment well    Behavior During Therapy [facility] for tasks assessed/performed             Past Medical History:  Diagnosis Date   Atrial flutter ([place])    CHF (congestive heart failure) ([place])    LVEF at time of cath 2002 50%; now has been 35-40% since 2008 (patient has seen multiple cardiologists over the years including [facility], [place], [facility], [facility] and Tollie Eth, MD)   Coronary artery disease    CABG x5 in 2000; s/p cath 2002 > 4/5 grafts patent, LVEF 50%   Degeneration of lumbar intervertebral disc    GERD (gastroesophageal reflux disease)    GI bleed    duodenal ulcer   Hypertension    Borderline   Ischemic heart disease    s/p MI, CABG 2000, s/p cath 2002 with 4/5 grafts patent (LIMA to LAD atretic and occluded mid vessel but adequate flow to distal LAD from collaterals / diagonal   Low back pain    PAC (premature atrial contraction)    PAF (paroxysmal atrial fibrillation) ([place])    initially diagnosed during stress test 2002   Peyronie's disease     Past Surgical History:  Procedure Laterality Date   ABLATION  05-06-2014   PVI and CTI ablation by Dr [name]   ATRIAL FIBRILLATION ABLATION N/A 05/06/2014   Procedure: ATRIAL FIBRILLATION ABLATION;  Surgeon: [name], MD;  Location: [facility] CATH LAB;  Service: Cardiovascular;  Laterality: N/A;    CARDIAC CATHETERIZATION  09/14/2001    Mildly decreased left ventricular systolic function --  Native three vessel coronary artery disease as described. -- Status post coronary artery bypass grafting   CORONARY ARTERY BYPASS GRAFT  2000   LIMA to LAD, SVG to second diagonal, seq SVG to ramus and OM, SVG to PDA   HERNIA REPAIR  05/16/2001   Large left indirect inguinal hernia with right direct hernia.   HERNIA REPAIR     Recurrent left inguinal hernia -- Large left indirect inguinal hernia with right direct hernia.   LEFT HEART CATHETERIZATION WITH CORONARY/GRAFT ANGIOGRAM  11/26/2013   Procedure: LEFT HEART CATHETERIZATION WITH [name];  Surgeon: Peter M Martinique, MD;  Location: [facility] CATH LAB;  Service: Cardiovascular;;   TEE WITHOUT CARDIOVERSION N/A 05/06/2014   Procedure: TRANSESOPHAGEAL ECHOCARDIOGRAM (TEE);  Surgeon: [name], MD;  Location: [facility];  Service: Cardiovascular;  Laterality: N/A;    There were no vitals filed for this visit.   Subjective Assessment - 08/26/21 1340     Subjective Pt states soreness with standing . Has been doing HEP    Patient Stated Goals Learn exercises for back, strengthening, posture,    Currently in Pain? Yes    Pain Score 3     Pain Location Back  Pain Orientation Right    Pain Descriptors / Indicators Aching    Pain Type Chronic pain    Pain Onset More than a month ago    Pain Frequency Intermittent                               [facility] Adult PT Treatment/Exercise - 08/26/21 0001       Exercises   Exercises Lumbar      Lumbar Exercises: Stretches   Active Hamstring Stretch 3 reps;30 seconds    Active Hamstring Stretch Limitations seated    Single Knee to Chest Stretch --    Double Knee to Chest Stretch 3 reps;30 seconds    Lower Trunk Rotation 5 reps;10 seconds    Lower Trunk Rotation Limitations with top leg extension    Piriformis Stretch 3 reps;30 seconds    Piriformis Stretch  Limitations supine      Lumbar Exercises: Aerobic   Recumbent Bike L2 x 8 min      Lumbar Exercises: Standing   Row 20 reps    Theraband Level (Row) Level 4 (Blue)    Row Limitations with TA    Other Standing Lumbar Exercises Paloff press Blue TB x 20 bil;      Lumbar Exercises: Supine   Ab Set --    Clam --    Clam Limitations --    Bent Knee Raise 20 reps    Bent Knee Raise Limitations with TA    Bridge 20 reps    Straight Leg Raise 10 reps    Straight Leg Raises Limitations bil with TA                       PT Short Term Goals - 08/15/21 2127       PT SHORT TERM GOAL #1   Title Pt to be independent with initial HEP    Time 2    Period Weeks    Status New    Target Date 08/27/21               PT Long Term Goals - 08/15/21 2127       PT LONG TERM GOAL #1   Title Pt to be independent with final HEP    Time 6    Period Weeks    Status New    Target Date 09/24/21      PT LONG TERM GOAL #2   Title Pt to report decreased pain in low back to 0-2/10 with activity    Time 6    Period Weeks    Status New    Target Date 09/24/21      PT LONG TERM GOAL #3   Title Pt to demo ability for optimal bend, lift, squat mechanis,with at least 25 lb, to improve ability for funcitonal activity and work duties    Time 6    Period Weeks    Status New    Target Date 09/24/21                   Plan - 08/26/21 1617     Clinical Impression Statement Pt did well with progression of core strengthening today. Does require cues for TA contraction, more diffiuclt in standing position. Discussed benefits of continued stretching/decompression for back pain.    Personal Factors and Comorbidities Time since onset of injury/illness/exacerbation;Past/Current Experience    Examination-Activity Limitations Sit;Sleep;Bend;Squat;Stairs;Stand;Transfers;Lift  Examination-Participation Restrictions Cleaning;Community Activity;Driving;Yard Work    Stability/Clinical  Decision Making Stable/Uncomplicated    Rehab Potential Fair    PT Frequency 2x / week    PT Duration 6 weeks    PT Treatment/Interventions ADLs/Self Care Home Management;Cryotherapy;Electrical Stimulation;Moist Heat;Traction;Balance training;Therapeutic exercise;Functional mobility training;Manual techniques;Therapeutic activities;Iontophoresis 4mg /ml Dexamethasone;Stair training;Gait training;DME Instruction;Ultrasound;Neuromuscular re-education;Patient/family education;Taping;Passive range of motion;Dry needling;Energy conservation;Joint Manipulations;Spinal Manipulations;Orthotic Fit/Training    PT Home Exercise Plan [email or web address]    Consulted and Agree with Plan of Care Patient             Patient will benefit from skilled therapeutic intervention in order to improve the following deficits and impairments:  Decreased activity tolerance, Decreased strength, Pain, Decreased mobility, Increased muscle spasms, Decreased range of motion, Improper body mechanics, Impaired flexibility  Visit Diagnosis: Chronic bilateral low back pain without sciatica  Chronic pain of right knee     Problem List Patient Active Problem List   Diagnosis Date Noted   Primary osteoarthritis of both knees 04/07/2021   PAF (paroxysmal atrial fibrillation) ([place])    Spinal stenosis of lumbar region with radiculopathy 04/06/2020   DDD (degenerative disc disease), lumbar 04/06/2020   Irritable bowel syndrome with constipation 05/16/2019   Anal sphincter spasm 03/26/2019   Seasonal and perennial allergic rhinitis 07/05/2018   Routine general medical examination at a health care facility 12/04/2016   Migraine without aura and with status migrainosus, not intractable 11/29/2016   Erectile dysfunction due to arterial insufficiency 11/29/2016   BPH (benign prostatic hyperplasia) 02/24/2015   Bilateral carpal tunnel syndrome 76/73/4193   Chronic systolic heart failure ([place]) 11/28/2013   Coronary artery disease     PUD (peptic ulcer disease) 07/22/2013   Hyperlipidemia with target low density lipoprotein (LDL) cholesterol less than 70 mg/dL 08/11/2010   ISCHEMIC CARDIOMYOPATHY 08/11/2010   Essential hypertension 08/10/2010   GERD 04/23/2008    Lyndee Hensen, PT, DPT 4:20 PM  08/26/21    [facility] [address] [place], [place], [address] Phone: [phone]   Fax:  [phone]  Name: Keith Vazquez MRN: [id number] Date of Birth: 17-Jun-1951

## 2021-09-02 ENCOUNTER — Encounter: Payer: Self-pay | Admitting: Physical Therapy

## 2021-09-02 ENCOUNTER — Ambulatory Visit (INDEPENDENT_AMBULATORY_CARE_PROVIDER_SITE_OTHER): Payer: Medicare Other | Admitting: Physical Therapy

## 2021-09-02 ENCOUNTER — Other Ambulatory Visit: Payer: Self-pay

## 2021-09-02 DIAGNOSIS — G8929 Other chronic pain: Secondary | ICD-10-CM | POA: Diagnosis not present

## 2021-09-02 DIAGNOSIS — M25561 Pain in right knee: Secondary | ICD-10-CM

## 2021-09-02 DIAGNOSIS — M545 Low back pain, unspecified: Secondary | ICD-10-CM | POA: Diagnosis not present

## 2021-09-05 ENCOUNTER — Encounter: Payer: Self-pay | Admitting: Physical Therapy

## 2021-09-05 NOTE — Therapy (Signed)
[facility] [address] [place], [place], [address] Phone: [phone]   Fax:  [phone]  Physical Therapy Treatment  Patient Details  Name: Keith Vazquez MRN: [id number] Date of Birth: 1951-09-10 Referring Provider (PT): Lynne Leader   Encounter Date: 09/02/2021   PT End of Session - 09/05/21 0805     Visit Number 4    Number of Visits 12    Date for PT Re-Evaluation 09/24/21    Authorization Type Medicare    PT Start Time 1303    PT Stop Time 1345    PT Time Calculation (min) 42 min    Activity Tolerance Patient tolerated treatment well    Behavior During Therapy [facility] for tasks assessed/performed             Past Medical History:  Diagnosis Date   Atrial flutter ([place])    CHF (congestive heart failure) ([place])    LVEF at time of cath 2002 50%; now has been 35-40% since 2008 (patient has seen multiple cardiologists over the years including [facility], [place], [facility], [facility] and Tollie Eth, MD)   Coronary artery disease    CABG x5 in 2000; s/p cath 2002 > 4/5 grafts patent, LVEF 50%   Degeneration of lumbar intervertebral disc    GERD (gastroesophageal reflux disease)    GI bleed    duodenal ulcer   Hypertension    Borderline   Ischemic heart disease    s/p MI, CABG 2000, s/p cath 2002 with 4/5 grafts patent (LIMA to LAD atretic and occluded mid vessel but adequate flow to distal LAD from collaterals / diagonal   Low back pain    PAC (premature atrial contraction)    PAF (paroxysmal atrial fibrillation) ([place])    initially diagnosed during stress test 2002   Peyronie's disease     Past Surgical History:  Procedure Laterality Date   ABLATION  05-06-2014   PVI and CTI ablation by Dr [name]   ATRIAL FIBRILLATION ABLATION N/A 05/06/2014   Procedure: ATRIAL FIBRILLATION ABLATION;  Surgeon: [name], MD;  Location: [facility] CATH LAB;  Service: Cardiovascular;  Laterality: N/A;    CARDIAC CATHETERIZATION  09/14/2001    Mildly decreased left ventricular systolic function --  Native three vessel coronary artery disease as described. -- Status post coronary artery bypass grafting   CORONARY ARTERY BYPASS GRAFT  2000   LIMA to LAD, SVG to second diagonal, seq SVG to ramus and OM, SVG to PDA   HERNIA REPAIR  05/16/2001   Large left indirect inguinal hernia with right direct hernia.   HERNIA REPAIR     Recurrent left inguinal hernia -- Large left indirect inguinal hernia with right direct hernia.   LEFT HEART CATHETERIZATION WITH CORONARY/GRAFT ANGIOGRAM  11/26/2013   Procedure: LEFT HEART CATHETERIZATION WITH [name];  Surgeon: Peter M Martinique, MD;  Location: [facility] CATH LAB;  Service: Cardiovascular;;   TEE WITHOUT CARDIOVERSION N/A 05/06/2014   Procedure: TRANSESOPHAGEAL ECHOCARDIOGRAM (TEE);  Surgeon: [name], MD;  Location: [facility];  Service: Cardiovascular;  Laterality: N/A;    There were no vitals filed for this visit.   Subjective Assessment - 09/05/21 0802     Subjective Pt had increased pain for a couple days, because he stood to blow leaves for about an hour, standing still increasing pain.    Currently in Pain? Yes    Pain Score 1     Pain Location Back  Pain Orientation Right    Pain Descriptors / Indicators Aching    Pain Type Chronic pain    Pain Onset More than a month ago    Pain Frequency Intermittent    Aggravating Factors  standing activity, walking                               [facility] Adult PT Treatment/Exercise - 09/05/21 0001       Self-Care   Self-Care Posture    Posture --   education and practice for bend/squat/ hip hinge postition for IADLS.     Exercises   Exercises Lumbar      Lumbar Exercises: Stretches   Double Knee to Chest Stretch 3 reps;30 seconds    Lower Trunk Rotation 5 reps;10 seconds      Lumbar Exercises: Aerobic   Recumbent Bike L2 x 8 min      Lumbar Exercises:  Standing   Functional Squats 10 reps    Functional Squats Limitations education on form.    Lifting 10 reps    Lifting Limitations practice for bend/lift mechanics 5lb box.    Row 20 reps    Other Standing Lumbar Exercises Paloff press Blue TB x 20 bil;      Lumbar Exercises: Supine   Ab Set 15 reps    Bent Knee Raise 20 reps    Bent Knee Raise Limitations with Blue TB    Bridge 20 reps                     PT Education - 09/05/21 0805     Education Details Posture education today, education and practice for hip hinge, bend/lift motions.    Person(s) Educated Patient    Methods Explanation;Demonstration;Tactile cues;Verbal cues    Comprehension Verbalized understanding;Returned demonstration;Verbal cues required;Tactile cues required;Need further instruction              PT Short Term Goals - 09/05/21 0806       PT SHORT TERM GOAL #1   Title Pt to be independent with initial HEP    Time 2    Period Weeks    Status Achieved    Target Date 08/27/21               PT Long Term Goals - 08/15/21 2127       PT LONG TERM GOAL #1   Title Pt to be independent with final HEP    Time 6    Period Weeks    Status New    Target Date 09/24/21      PT LONG TERM GOAL #2   Title Pt to report decreased pain in low back to 0-2/10 with activity    Time 6    Period Weeks    Status New    Target Date 09/24/21      PT LONG TERM GOAL #3   Title Pt to demo ability for optimal bend, lift, squat mechanis,with at least 25 lb, to improve ability for funcitonal activity and work duties    Time 6    Period Weeks    Status New    Target Date 09/24/21                   Plan - 09/05/21 0807     Clinical Impression Statement Education and practice today for achieving active TA contraction, pt with difficulty wiht this in supine  postition today Also focused on standing postition/mehcanics for outdoor and household work, with slight  flexion and hip hinge motion  for back pain. Pt to benefit from continued strengthening and education on body mechanics for activity.    Personal Factors and Comorbidities Time since onset of injury/illness/exacerbation;Past/Current Experience    Examination-Activity Limitations Sit;Sleep;Bend;Squat;Stairs;Stand;Transfers;Lift    Examination-Participation Restrictions Cleaning;Community Activity;Driving;Yard Work    Stability/Clinical Decision Making Stable/Uncomplicated    Rehab Potential Fair    PT Frequency 2x / week    PT Duration 6 weeks    PT Treatment/Interventions ADLs/Self Care Home Management;Cryotherapy;Electrical Stimulation;Moist Heat;Traction;Balance training;Therapeutic exercise;Functional mobility training;Manual techniques;Therapeutic activities;Iontophoresis 4mg /ml Dexamethasone;Stair training;Gait training;DME Instruction;Ultrasound;Neuromuscular re-education;Patient/family education;Taping;Passive range of motion;Dry needling;Energy conservation;Joint Manipulations;Spinal Manipulations;Orthotic Fit/Training    PT Home Exercise Plan [email or web address]    Consulted and Agree with Plan of Care Patient             Patient will benefit from skilled therapeutic intervention in order to improve the following deficits and impairments:  Decreased activity tolerance, Decreased strength, Pain, Decreased mobility, Increased muscle spasms, Decreased range of motion, Improper body mechanics, Impaired flexibility  Visit Diagnosis: Chronic bilateral low back pain without sciatica  Chronic pain of right knee     Problem List Patient Active Problem List   Diagnosis Date Noted   Primary osteoarthritis of both knees 04/07/2021   PAF (paroxysmal atrial fibrillation) ([place])    Spinal stenosis of lumbar region with radiculopathy 04/06/2020   DDD (degenerative disc disease), lumbar 04/06/2020   Irritable bowel syndrome with constipation 05/16/2019   Anal sphincter spasm 03/26/2019   Seasonal and perennial allergic rhinitis  07/05/2018   Routine general medical examination at a health care facility 12/04/2016   Migraine without aura and with status migrainosus, not intractable 11/29/2016   Erectile dysfunction due to arterial insufficiency 11/29/2016   BPH (benign prostatic hyperplasia) 02/24/2015   Bilateral carpal tunnel syndrome 40/34/7425   Chronic systolic heart failure ([place]) 11/28/2013   Coronary artery disease    PUD (peptic ulcer disease) 07/22/2013   Hyperlipidemia with target low density lipoprotein (LDL) cholesterol less than 70 mg/dL 08/11/2010   ISCHEMIC CARDIOMYOPATHY 08/11/2010   Essential hypertension 08/10/2010   GERD 04/23/2008    Lyndee Hensen, PT, DPT 8:09 AM  09/05/21    [facility] [address] [place], [place], [address] Phone: [phone]   Fax:  [phone]  Name: Keith Vazquez MRN: [id number] Date of Birth: 12/31/1950

## 2021-09-06 DIAGNOSIS — Z20822 Contact with and (suspected) exposure to covid-19: Secondary | ICD-10-CM | POA: Diagnosis not present

## 2021-09-09 ENCOUNTER — Encounter: Payer: Self-pay | Admitting: Physical Therapy

## 2021-09-09 ENCOUNTER — Ambulatory Visit (INDEPENDENT_AMBULATORY_CARE_PROVIDER_SITE_OTHER): Payer: Medicare Other | Admitting: Physical Therapy

## 2021-09-09 ENCOUNTER — Encounter: Payer: Self-pay | Admitting: Internal Medicine

## 2021-09-09 ENCOUNTER — Other Ambulatory Visit: Payer: Self-pay

## 2021-09-09 DIAGNOSIS — M545 Low back pain, unspecified: Secondary | ICD-10-CM

## 2021-09-09 DIAGNOSIS — M25561 Pain in right knee: Secondary | ICD-10-CM

## 2021-09-09 DIAGNOSIS — G8929 Other chronic pain: Secondary | ICD-10-CM | POA: Diagnosis not present

## 2021-09-09 NOTE — Therapy (Signed)
[facility] [address] [place], [place], [address] Phone: [phone]   Fax:  [phone]  Physical Therapy Treatment  Patient Details  Name: Keith Vazquez MRN: [id number] Date of Birth: 04/29/1951 Referring Provider (PT): Lynne Leader   Encounter Date: 09/09/2021   PT End of Session - 09/09/21 1354     Visit Number 5    Number of Visits 12    Date for PT Re-Evaluation 09/24/21    Authorization Type Medicare    PT Start Time 1302    PT Stop Time 5537    PT Time Calculation (min) 40 min    Activity Tolerance Patient tolerated treatment well    Behavior During Therapy [facility] for tasks assessed/performed             Past Medical History:  Diagnosis Date   Atrial flutter ([place])    CHF (congestive heart failure) ([place])    LVEF at time of cath 2002 50%; now has been 35-40% since 2008 (patient has seen multiple cardiologists over the years including [facility], [place], [facility], [facility] and Tollie Eth, MD)   Coronary artery disease    CABG x5 in 2000; s/p cath 2002 > 4/5 grafts patent, LVEF 50%   Degeneration of lumbar intervertebral disc    GERD (gastroesophageal reflux disease)    GI bleed    duodenal ulcer   Hypertension    Borderline   Ischemic heart disease    s/p MI, CABG 2000, s/p cath 2002 with 4/5 grafts patent (LIMA to LAD atretic and occluded mid vessel but adequate flow to distal LAD from collaterals / diagonal   Low back pain    PAC (premature atrial contraction)    PAF (paroxysmal atrial fibrillation) ([place])    initially diagnosed during stress test 2002   Peyronie's disease     Past Surgical History:  Procedure Laterality Date   ABLATION  05-06-2014   PVI and CTI ablation by Dr [name]   ATRIAL FIBRILLATION ABLATION N/A 05/06/2014   Procedure: ATRIAL FIBRILLATION ABLATION;  Surgeon: [name], MD;  Location: [facility] CATH LAB;  Service: Cardiovascular;  Laterality: N/A;    CARDIAC CATHETERIZATION  09/14/2001    Mildly decreased left ventricular systolic function --  Native three vessel coronary artery disease as described. -- Status post coronary artery bypass grafting   CORONARY ARTERY BYPASS GRAFT  2000   LIMA to LAD, SVG to second diagonal, seq SVG to ramus and OM, SVG to PDA   HERNIA REPAIR  05/16/2001   Large left indirect inguinal hernia with right direct hernia.   HERNIA REPAIR     Recurrent left inguinal hernia -- Large left indirect inguinal hernia with right direct hernia.   LEFT HEART CATHETERIZATION WITH CORONARY/GRAFT ANGIOGRAM  11/26/2013   Procedure: LEFT HEART CATHETERIZATION WITH [name];  Surgeon: Peter M Martinique, MD;  Location: [facility] CATH LAB;  Service: Cardiovascular;;   TEE WITHOUT CARDIOVERSION N/A 05/06/2014   Procedure: TRANSESOPHAGEAL ECHOCARDIOGRAM (TEE);  Surgeon: [name], MD;  Location: [facility];  Service: Cardiovascular;  Laterality: N/A;    There were no vitals filed for this visit.   Subjective Assessment - 09/09/21 1352     Subjective Pt states decreasing pain. He has also been limiting the time he is standing/doing yard work for. Has been doing HEP.    Currently in Pain? Yes    Pain Score 2     Pain Location Back    Pain  Orientation Right    Pain Descriptors / Indicators Aching    Pain Type Chronic pain    Pain Onset More than a month ago    Pain Frequency Intermittent    Aggravating Factors  standing, walking activity                               [facility] Adult PT Treatment/Exercise - 09/09/21 0001       Exercises   Exercises Lumbar      Lumbar Exercises: Stretches   Double Knee to Chest Stretch 3 reps;30 seconds    Lower Trunk Rotation 5 reps;10 seconds      Lumbar Exercises: Aerobic   Recumbent Bike L2 x 8 min      Lumbar Exercises: Standing   Functional Squats 10 reps    Functional Squats Limitations education on form.    Row 20 reps    Row Limitations black  TB    Other Standing Lumbar Exercises Paloff press Blue TB x 20 bil;      Lumbar Exercises: Seated   Sit to Stand 10 reps      Lumbar Exercises: Supine   Ab Set 5 reps    Bridge 20 reps    Straight Leg Raise 15 reps    Straight Leg Raises Limitations with TA                     PT Education - 09/09/21 1354     Education Details reviewed HEP and POC    Person(s) Educated Patient    Methods Explanation;Demonstration;Tactile cues;Verbal cues    Comprehension Verbalized understanding;Returned demonstration;Verbal cues required;Tactile cues required;Need further instruction              PT Short Term Goals - 09/05/21 0806       PT SHORT TERM GOAL #1   Title Pt to be independent with initial HEP    Time 2    Period Weeks    Status Achieved    Target Date 08/27/21               PT Long Term Goals - 08/15/21 2127       PT LONG TERM GOAL #1   Title Pt to be independent with final HEP    Time 6    Period Weeks    Status New    Target Date 09/24/21      PT LONG TERM GOAL #2   Title Pt to report decreased pain in low back to 0-2/10 with activity    Time 6    Period Weeks    Status New    Target Date 09/24/21      PT LONG TERM GOAL #3   Title Pt to demo ability for optimal bend, lift, squat mechanis,with at least 25 lb, to improve ability for funcitonal activity and work duties    Time 6    Period Weeks    Status New    Target Date 09/24/21                   Plan - 09/09/21 1355     Clinical Impression Statement Pt with improving pain frequency and intensity. He is doing very well with HEP. Ther ex today for core strengthening, pt requires cues for optimal TA contraction with most exercises. Improved lumbar flexion ROM as well. Pt progressing well, likley d/c next visit.  Personal Factors and Comorbidities Time since onset of injury/illness/exacerbation;Past/Current Experience    Examination-Activity Limitations  Sit;Sleep;Bend;Squat;Stairs;Stand;Transfers;Lift    Examination-Participation Restrictions Cleaning;Community Activity;Driving;Yard Work    Stability/Clinical Decision Making Stable/Uncomplicated    Rehab Potential Fair    PT Frequency 2x / week    PT Duration 6 weeks    PT Treatment/Interventions ADLs/Self Care Home Management;Cryotherapy;Electrical Stimulation;Moist Heat;Traction;Balance training;Therapeutic exercise;Functional mobility training;Manual techniques;Therapeutic activities;Iontophoresis 4mg /ml Dexamethasone;Stair training;Gait training;DME Instruction;Ultrasound;Neuromuscular re-education;Patient/family education;Taping;Passive range of motion;Dry needling;Energy conservation;Joint Manipulations;Spinal Manipulations;Orthotic Fit/Training    PT Home Exercise Plan [email or web address]    Consulted and Agree with Plan of Care Patient             Patient will benefit from skilled therapeutic intervention in order to improve the following deficits and impairments:  Decreased activity tolerance, Decreased strength, Pain, Decreased mobility, Increased muscle spasms, Decreased range of motion, Improper body mechanics, Impaired flexibility  Visit Diagnosis: Chronic bilateral low back pain without sciatica  Chronic pain of right knee     Problem List Patient Active Problem List   Diagnosis Date Noted   Primary osteoarthritis of both knees 04/07/2021   PAF (paroxysmal atrial fibrillation) ([place])    Spinal stenosis of lumbar region with radiculopathy 04/06/2020   DDD (degenerative disc disease), lumbar 04/06/2020   Irritable bowel syndrome with constipation 05/16/2019   Anal sphincter spasm 03/26/2019   Seasonal and perennial allergic rhinitis 07/05/2018   Routine general medical examination at a health care facility 12/04/2016   Migraine without aura and with status migrainosus, not intractable 11/29/2016   Erectile dysfunction due to arterial insufficiency 11/29/2016   BPH (benign  prostatic hyperplasia) 02/24/2015   Bilateral carpal tunnel syndrome 66/29/4765   Chronic systolic heart failure ([place]) 11/28/2013   Coronary artery disease    PUD (peptic ulcer disease) 07/22/2013   Hyperlipidemia with target low density lipoprotein (LDL) cholesterol less than 70 mg/dL 08/11/2010   ISCHEMIC CARDIOMYOPATHY 08/11/2010   Essential hypertension 08/10/2010   GERD 04/23/2008   Lyndee Hensen, PT, DPT 1:56 PM  09/09/21    [facility] [address] [place], [place], [address] Phone: [phone]   Fax:  [phone]  Name: Keith Vazquez MRN: [id number] Date of Birth: 12-06-1950

## 2021-09-14 DIAGNOSIS — Z20822 Contact with and (suspected) exposure to covid-19: Secondary | ICD-10-CM | POA: Diagnosis not present

## 2021-09-16 ENCOUNTER — Ambulatory Visit (INDEPENDENT_AMBULATORY_CARE_PROVIDER_SITE_OTHER): Payer: Medicare Other | Admitting: Physical Therapy

## 2021-09-16 ENCOUNTER — Other Ambulatory Visit: Payer: Self-pay

## 2021-09-16 DIAGNOSIS — G8929 Other chronic pain: Secondary | ICD-10-CM

## 2021-09-16 DIAGNOSIS — M545 Low back pain, unspecified: Secondary | ICD-10-CM

## 2021-09-16 DIAGNOSIS — M25561 Pain in right knee: Secondary | ICD-10-CM | POA: Diagnosis not present

## 2021-09-17 ENCOUNTER — Encounter: Payer: Self-pay | Admitting: Physical Therapy

## 2021-09-17 NOTE — Patient Instructions (Signed)
Access Code: [email or web address] URL: [email or web address] Date: 09/17/2021 Prepared by: Lyndee Hensen  Exercises Supine Transversus Abdominis Bracing - Hands on Stomach - 2 x daily - 1 sets - 10 reps - 5 hold Supine Lower Trunk Rotation - 2 x daily - 10 reps - 5 hold Single Knee to Chest Stretch - 2 x daily - 3 reps - 30 hold Supine Bridge - 1 x daily - 2 sets - 10 reps Seated Hamstring Stretch - 2 x daily - 3 reps - 30 hold Cat Cow - 2 x daily - 1 sets - 10 reps Supine March - 1 x daily - 2 sets - 10 reps Straight Leg Raise - 1 x daily - 1 sets - 10 reps Standing Anti-Rotation Press with Anchored Resistance - 1 x daily - 2 sets - 10 reps Standing Row with Anchored Resistance - 1 x daily - 2 sets - 10 reps Curl Up with Reach - 1 x daily - 1-2 sets - 10 reps

## 2021-09-17 NOTE — Therapy (Signed)
[facility] [address] [place], [place], [address] Phone: [phone]   Fax:  [phone]  Physical Therapy Treatment/Discharge  Patient Details  Name: Keith Vazquez MRN: [id number] Date of Birth: 12/05/1950 Referring Provider (PT): Lynne Leader   Encounter Date: 09/16/2021   PT End of Session - 09/17/21 1029     Visit Number 6    Number of Visits 12    Date for PT Re-Evaluation 09/24/21    Authorization Type Medicare    PT Start Time 1520    PT Stop Time 1600    PT Time Calculation (min) 40 min    Activity Tolerance Patient tolerated treatment well    Behavior During Therapy [facility] for tasks assessed/performed             Past Medical History:  Diagnosis Date   Atrial flutter ([place])    CHF (congestive heart failure) ([place])    LVEF at time of cath 2002 50%; now has been 35-40% since 2008 (patient has seen multiple cardiologists over the years including [facility], [place], [facility], [facility] and Tollie Eth, MD)   Coronary artery disease    CABG x5 in 2000; s/p cath 2002 > 4/5 grafts patent, LVEF 50%   Degeneration of lumbar intervertebral disc    GERD (gastroesophageal reflux disease)    GI bleed    duodenal ulcer   Hypertension    Borderline   Ischemic heart disease    s/p MI, CABG 2000, s/p cath 2002 with 4/5 grafts patent (LIMA to LAD atretic and occluded mid vessel but adequate flow to distal LAD from collaterals / diagonal   Low back pain    PAC (premature atrial contraction)    PAF (paroxysmal atrial fibrillation) ([place])    initially diagnosed during stress test 2002   Peyronie's disease     Past Surgical History:  Procedure Laterality Date   ABLATION  05-06-2014   PVI and CTI ablation by Dr [name]   ATRIAL FIBRILLATION ABLATION N/A 05/06/2014   Procedure: ATRIAL FIBRILLATION ABLATION;  Surgeon: [name], MD;  Location: [facility] CATH LAB;  Service: Cardiovascular;  Laterality:  N/A;   CARDIAC CATHETERIZATION  09/14/2001    Mildly decreased left ventricular systolic function --  Native three vessel coronary artery disease as described. -- Status post coronary artery bypass grafting   CORONARY ARTERY BYPASS GRAFT  2000   LIMA to LAD, SVG to second diagonal, seq SVG to ramus and OM, SVG to PDA   HERNIA REPAIR  05/16/2001   Large left indirect inguinal hernia with right direct hernia.   HERNIA REPAIR     Recurrent left inguinal hernia -- Large left indirect inguinal hernia with right direct hernia.   LEFT HEART CATHETERIZATION WITH CORONARY/GRAFT ANGIOGRAM  11/26/2013   Procedure: LEFT HEART CATHETERIZATION WITH [name];  Surgeon: Peter M Martinique, MD;  Location: [facility] CATH LAB;  Service: Cardiovascular;;   TEE WITHOUT CARDIOVERSION N/A 05/06/2014   Procedure: TRANSESOPHAGEAL ECHOCARDIOGRAM (TEE);  Surgeon: [name], MD;  Location: [facility];  Service: Cardiovascular;  Laterality: N/A;    There were no vitals filed for this visit.   Subjective Assessment - 09/17/21 1012     Subjective Pt states minimal pain with regular activities. He does have increased pain with prolonged standing and walking activity. He feels he is doing much better than before, and is doing well managing pain. knee doing very well, reports no pain.    Pertinent History  CHF, CAD, CABG,    Limitations Sitting;Lifting;Standing;Walking;House hold activities    Currently in Pain? Yes    Pain Score 2     Pain Location Back    Pain Orientation Right    Pain Descriptors / Indicators Aching    Pain Type Chronic pain    Pain Frequency Intermittent    Aggravating Factors  prolonged standing and walking                               [facility] Adult PT Treatment/Exercise - 09/17/21 0001       Exercises   Exercises Lumbar      Lumbar Exercises: Stretches   Active Hamstring Stretch 3 reps;30 seconds    Active Hamstring Stretch Limitations seated    Double  Knee to Chest Stretch 3 reps;30 seconds    Other Lumbar Stretch Exercise seated fwd lumbar flexion x 1 min;      Lumbar Exercises: Aerobic   Recumbent Bike L2 x 8 min      Lumbar Exercises: Standing   Functional Squats 10 reps    Row 20 reps    Other Standing Lumbar Exercises Paloff press Blue TB x 20 bil;      Lumbar Exercises: Seated   Sit to Stand 10 reps      Lumbar Exercises: Supine   Bridge 20 reps    Straight Leg Raise 15 reps    Straight Leg Raises Limitations with TA    Other Supine Lumbar Exercises modified crunch x 15;                     PT Education - 09/17/21 1027     Education Details Reviewed HEP, discussed d/c    Person(s) Educated Patient    Methods Explanation;Demonstration;Tactile cues;Verbal cues;Handout    Comprehension Verbalized understanding;Returned demonstration;Verbal cues required;Tactile cues required              PT Short Term Goals - 09/05/21 0806       PT SHORT TERM GOAL #1   Title Pt to be independent with initial HEP    Time 2    Period Weeks    Status Achieved    Target Date 08/27/21               PT Long Term Goals - 09/17/21 1032       PT LONG TERM GOAL #1   Title Pt to be independent with final HEP    Time 6    Period Weeks    Status Achieved      PT LONG TERM GOAL #2   Title Pt to report decreased pain in low back to 0-2/10 with activity    Time 6    Period Weeks    Status Achieved      PT LONG TERM GOAL #3   Title Pt to demo ability for optimal bend, lift, squat mechanis,with at least 25 lb, to improve ability for funcitonal activity and work duties    Time 6    Period Weeks    Status Achieved                   Plan - 09/17/21 1033     Clinical Impression Statement Pt doing very well managing pain at this time. He does have mild soreness with prolonged standing, walking activity and outdoor activity. He is doing better managing it with improved posture  with activity, and has done  very well with strengthening and mobility. He is doing well with HEP, and will continue to perform. Pt happy with progress at this time, and is ready for d/c to HEP.    Personal Factors and Comorbidities Time since onset of injury/illness/exacerbation;Past/Current Experience    Examination-Activity Limitations Sit;Sleep;Bend;Squat;Stairs;Stand;Transfers;Lift    Examination-Participation Restrictions Cleaning;Community Activity;Driving;Yard Work    Stability/Clinical Decision Making Stable/Uncomplicated    Rehab Potential Fair    PT Frequency 2x / week    PT Duration 6 weeks    PT Treatment/Interventions ADLs/Self Care Home Management;Cryotherapy;Electrical Stimulation;Moist Heat;Traction;Balance training;Therapeutic exercise;Functional mobility training;Manual techniques;Therapeutic activities;Iontophoresis 55m/ml Dexamethasone;Stair training;Gait training;DME Instruction;Ultrasound;Neuromuscular re-education;Patient/family education;Taping;Passive range of motion;Dry needling;Energy conservation;Joint Manipulations;Spinal Manipulations;Orthotic Fit/Training    PT Home Exercise Plan [email or web address]    Consulted and Agree with Plan of Care Patient             Patient will benefit from skilled therapeutic intervention in order to improve the following deficits and impairments:  Decreased activity tolerance, Decreased strength, Pain, Decreased mobility, Increased muscle spasms, Decreased range of motion, Improper body mechanics, Impaired flexibility  Visit Diagnosis: Chronic bilateral low back pain without sciatica  Chronic pain of right knee     Problem List Patient Active Problem List   Diagnosis Date Noted   Primary osteoarthritis of both knees 04/07/2021   [place]    Spinal stenosis of lumbar region with radiculopathy 04/06/2020   DDD (degenerative disc disease), lumbar 04/06/2020   Irritable bowel syndrome with constipation 05/16/2019   Anal sphincter  spasm 03/26/2019   Seasonal and perennial allergic rhinitis 07/05/2018   Routine general medical examination at a health care facility 12/04/2016   Migraine without aura and with status migrainosus, not intractable 11/29/2016   Erectile dysfunction due to arterial insufficiency 11/29/2016   BPH (benign prostatic hyperplasia) 02/24/2015   Bilateral carpal tunnel syndrome 083/15/1761  Ch[place] ([place] 11/28/2013   Coronary artery disease    PUD (peptic ulcer disease) 07/22/2013   Hyperlipidemia with target low density lipoprotein (LDL) cholesterol less than 70 mg/dL 08/11/2010   ISCHEMIC CARDIOMYOPATHY 08/11/2010   Essential hypertension 08/10/2010   GERD 04/23/2008    LLyndee Hensen PT, DPT 10:37 AM  09/17/21    [facility] [address]Phone: [phone]  Fax:  [phone] Name: Keith Vazquez: [id number] of Birth: [birth year]  PHYSICAL THERAPY DISCHARGE SUMMARY  Visits from Start of Care: 6 Plan: Patient agrees to discharge.  Patient goals were met. Patient is being discharged due to meeting the stated rehab goals.      LLyndee Hensen PT, DPT 10:38 AM  09/17/21

## 2021-09-20 ENCOUNTER — Encounter: Payer: Medicare Other | Admitting: Physical Therapy

## 2021-10-01 ENCOUNTER — Other Ambulatory Visit: Payer: Self-pay | Admitting: Family Medicine

## 2021-10-01 DIAGNOSIS — I110 Hypertensive heart disease with heart failure: Secondary | ICD-10-CM | POA: Diagnosis not present

## 2021-10-01 DIAGNOSIS — I480 Paroxysmal atrial fibrillation: Secondary | ICD-10-CM | POA: Diagnosis not present

## 2021-10-01 DIAGNOSIS — E785 Hyperlipidemia, unspecified: Secondary | ICD-10-CM | POA: Diagnosis not present

## 2021-10-01 DIAGNOSIS — I2510 Atherosclerotic heart disease of native coronary artery without angina pectoris: Secondary | ICD-10-CM | POA: Diagnosis not present

## 2021-10-01 DIAGNOSIS — I5022 Chronic systolic (congestive) heart failure: Secondary | ICD-10-CM | POA: Diagnosis not present

## 2021-10-01 LAB — COMPREHENSIVE METABOLIC PANEL
ALT: 10 IU/L (ref 0–44)
AST: 25 IU/L (ref 0–40)
Albumin/Globulin Ratio: 1.7 (ref 1.2–2.2)
Albumin: 4.6 g/dL (ref 3.7–4.7)
Alkaline Phosphatase: 92 IU/L (ref 44–121)
BUN/Creatinine Ratio: 16 (ref 10–24)
BUN: 17 mg/dL (ref 8–27)
Bilirubin Total: 0.5 mg/dL (ref 0.0–1.2)
CO2: 24 mmol/L (ref 20–29)
Calcium: 9.3 mg/dL (ref 8.6–10.2)
Chloride: 101 mmol/L (ref 96–106)
Creatinine, Ser: 1.07 mg/dL (ref 0.76–1.27)
Globulin, Total: 2.7 g/dL (ref 1.5–4.5)
Glucose: 93 mg/dL (ref 70–99)
Potassium: 4.2 mmol/L (ref 3.5–5.2)
Sodium: 136 mmol/L (ref 134–144)
Total Protein: 7.3 g/dL (ref 6.0–8.5)
eGFR: 74 mL/min/{1.73_m2} (ref >59)

## 2021-10-01 LAB — TSH: TSH: 3.15 u[IU]/mL (ref 0.450–4.500)

## 2021-10-03 NOTE — Progress Notes (Signed)
Cardiology Office Note    Date:  10/05/2021   ID:  Keith Vazquez, DOB May 11, 1951, MRN [id number]  PCP:  [name], MD  Cardiologist:  Jazlynne Milliner Martinique, MD    History of Present Illness:  Keith Vazquez is a 70 y.o. male seen for evaluation of chest pain. He is post afib ablation 05/05/14.   He has an ischemic CM with chronic systolic CHF.   He is s/p CABG in 2000. Cardiac caths in 2002 and 2015 showed atretic LIMA to the LAD which filled by the SVG to the second diagonal. The SVG to the second diagonal, SVG sequentially to the ramus and OM, and the SVG to PDA were all patent. Last Myoview study in April 2016 was felt to be low risk. He had an ETT in June 2017 with no ischemia at 13 METs. He also has a history of HTN.  In January of 2018 he was started on Entresto- reported intolerance of higher doses. He is on Coreg.  He states he could not tolerate aldactone due to severe depression and quit taking it.  Myoview in 2019 showed inferior scar without ischemia and EF 30%. Echo in May 2021 showed EF down to 25-30%. He was experiencing symptoms of early morning dyspnea and some palpitations. These episodes were rare only occurring 4x/year.  He was seen by Dr. [name]. No mention of a ILR but he did discuss consideration of ICD. Not a candidate for CRT due to narrow QRS. 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   He was having paroxysms of symptomatic atrial fibrillation that occur sporadically.   He has an Apple watch which he uses to track these episodes.  This has detected possible atrial fibrillation.  He  wore an event monitor which confirmed he is having paroxysms of atrial fibrillation.  The burden was 2-3%. He was seen by Dr [name]. His note indicates complete discussions about AAD therapy, anticoagulation, repeat ablation, and ICD. Patient did not want to consider ICD. Dr [name] felt that with low Afib burden the best thing to do was to  monitor for now unless Afib burden increases significantly. He is on Eliquis for anticoagulation.   On his last visit he noted more frequent episodes of afib about every 10 days. These episodes last 12-16 hours. States he notices these more than he used to. He does report that he took amiodarone in the past prior to ablation and tolerated it well.  He does note some depressive symptoms as well "feeling of gloom and doom". 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 Ultimately we decided to resume amiodarone 400 mg daily and Eliquis.   On follow up today he feels very well. Has not had any Afib in 6 months. Is going to the gym regularly. No chest pain or dyspnea. Is tolerating medication well. No "gloom and doom".     Past Medical History:  Diagnosis Date   Atrial flutter ([place])    CHF (congestive heart failure) ([place])    LVEF at time of cath 2002 50%; now has been 35-40% since 2008 (patient has seen multiple cardiologists over the years including [facility], [employer], [facility], [facility] and Tollie Eth, MD)   Coronary artery disease    CABG x5 in 2000; s/p cath 2002 > 4/5 grafts patent, LVEF 50%   Degeneration  of lumbar intervertebral disc    GERD (gastroesophageal reflux disease)    GI bleed    duodenal ulcer   Hypertension    Borderline   Ischemic heart disease    s/p MI, CABG 2000, s/p cath 2002 with 4/5 grafts patent (LIMA to LAD atretic and occluded mid vessel but adequate flow to distal LAD from collaterals / diagonal   Low back pain    PAC (premature atrial contraction)    PAF (paroxysmal atrial fibrillation) ([place])    initially diagnosed during stress test 2002   Peyronie's disease     Past Surgical History:  Procedure Laterality Date   ABLATION  05-06-2014   PVI and CTI ablation by Dr [name]   ATRIAL FIBRILLATION ABLATION N/A 05/06/2014   Procedure: ATRIAL FIBRILLATION  ABLATION;  Surgeon: [name], MD;  Location: [facility] CATH LAB;  Service: Cardiovascular;  Laterality: N/A;   CARDIAC CATHETERIZATION  09/14/2001    Mildly decreased left ventricular systolic function --  Native three vessel coronary artery disease as described. -- Status post coronary artery bypass grafting   CORONARY ARTERY BYPASS GRAFT  2000   LIMA to LAD, SVG to second diagonal, seq SVG to ramus and OM, SVG to PDA   HERNIA REPAIR  05/16/2001   Large left indirect inguinal hernia with right direct hernia.   HERNIA REPAIR     Recurrent left inguinal hernia -- Large left indirect inguinal hernia with right direct hernia.   LEFT HEART CATHETERIZATION WITH CORONARY/GRAFT ANGIOGRAM  11/26/2013   Procedure: LEFT HEART CATHETERIZATION WITH [name];  Surgeon: Shahiem Bedwell M Martinique, MD;  Location: [facility] CATH LAB;  Service: Cardiovascular;;   TEE WITHOUT CARDIOVERSION N/A 05/06/2014   Procedure: TRANSESOPHAGEAL ECHOCARDIOGRAM (TEE);  Surgeon: [name], MD;  Location: [facility] ENDOSCOPY;  Service: Cardiovascular;  Laterality: N/A;    Current Medications: Outpatient Medications Prior to Visit  Medication Sig Dispense Refill   apixaban (ELIQUIS) 5 MG TABS tablet Take 1 tablet (5 mg total) by mouth 2 (two) times daily. 180 tablet 1   Ascorbic Acid (VITAMIN C) 1000 MG tablet Take 1,000 mg by mouth daily.     b complex vitamins tablet Take 1 tablet by mouth daily.     carvedilol (COREG) 12.5 MG tablet Take 1.5 tablets (18.75 mg total) by mouth 2 (two) times daily with a meal. 270 tablet 3   cholecalciferol (VITAMIN D3) 25 MCG (1000 UNIT) tablet Take 1,000 Units by mouth daily.     co-enzyme Q-10 30 MG capsule Take 30 mg by mouth daily.     dicyclomine (BENTYL) 20 MG tablet Take 20 mg by mouth 4 (four) times daily.     gabapentin (NEURONTIN) 100 MG capsule Take 2-3 capsules (200-300 mg total) by mouth at bedtime. 270 capsule 1   niacin 500 MG tablet Take 500 mg by mouth at bedtime.     omeprazole  (PRILOSEC) 10 MG capsule Take 1 capsule (10 mg total) by mouth daily. 30 capsule 1   Probiotic Product (PROBIOTIC-10 PO) Take 1 capsule by mouth daily.      rosuvastatin (CRESTOR) 5 MG tablet Take 1 tablet (5 mg total) by mouth daily. 90 tablet 1   sertraline (ZOLOFT) 25 MG tablet Take 1 tablet (25 mg total) by mouth daily. 30 tablet 11   SUMAtriptan (IMITREX) 100 MG tablet TAKE 1 TABLET AT ONSET OF MIGRAINE- MAY REPEAT ONCE IN 2 HOURS. LIMIT 2/24 HOURS. AVOID DAILY USE. 30 tablet 3   TURMERIC PO Take  1 tablet by mouth daily.     vitamin E 200 UNIT capsule Take 200 Units by mouth daily.     amiodarone (PACERONE) 200 MG tablet Take 2 tablets (400 mg total) by mouth daily. 90 tablet 3   sacubitril-valsartan (ENTRESTO) 24-26 MG Take 1 tablet by mouth 2 (two) times daily. 180 tablet 3   No facility-administered medications prior to visit.     Allergies:   Amiodarone, Oxycodone-acetaminophen, and Propoxyphene n-acetaminophen   Social History   Socioeconomic History   Marital status: Single    Spouse name: Not on file   Number of children: 0   Years of education: Not on file   Highest education level: Not on file  Occupational History   Occupation: garage owner/corporate pilot  Tobacco Use   Smoking status: Former    Packs/day: 1.00    Years: 20.00    Pack years: 20.00    Types: Cigarettes    Quit date: 10/31/1994    Years since quitting: 26.9   Smokeless tobacco: Never  Vaping Use   Vaping Use: Never used  Substance and Sexual Activity   Alcohol use: No    Alcohol/week: 0.0 standard drinks   Drug use: No   Sexual activity: Yes  Other Topics Concern   Not on file  Social History Narrative   Lives in [place].  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  Also flies airplanes   Social Determinants of Health   Financial Resource Strain: Not on file  Food Insecurity: Not on file  Transportation Needs: Not on file  Physical Activity: Not on file  Stress: Not on file   Social Connections: Not on file     Family History:  The patient's family history is not on file. He was adopted.   ROS:   Please see the history of present illness.    ROS All other systems reviewed and are negative.   PHYSICAL EXAM:   VS:  BP 110/66   Pulse 61   Wt 190 lb 3.2 oz (86.3 kg)   SpO2 98%   BMI 25.09 kg/m    GEN: Well nourished, well developed, in no acute distress  HEENT: normal  Neck: no JVD, carotid bruits, or masses Cardiac: RRR; no murmurs, rubs, or gallops,no edema  Respiratory:  clear to auscultation bilaterally, normal work of breathing GI: soft, nontender, nondistended, + BS MS: no deformity or atrophy  Skin: warm and dry, no rash Neuro:  Alert and Oriented x 3, Strength and sensation are intact Psych: euthymic mood, full affect  Wt Readings from Last 3 Encounters:  10/05/21 190 lb 3.2 oz (86.3 kg)  06/28/21 198 lb (89.8 kg)  04/13/21 202 lb 6.4 oz (91.8 kg)      Studies/Labs Reviewed:   EKG:  EKG is ordered today.  The ekg ordered today demonstrates NSR rate 61. Old inferior infarct. Poor R wave progression. Normal QTc. I have personally reviewed and interpreted this study.   Recent Labs: 04/07/2021: Hemoglobin 15.4; Platelets 162.0 10/01/2021: ALT 10; BUN 17; Creatinine, Ser 1.07; Potassium 4.2; Sodium 136; TSH 3.150   Lipid Panel    Component Value Date/Time   CHOL 154 02/08/2021 1134   CHOL 196 02/22/2018 1002   TRIG 190.0 (H) 02/08/2021 1134   HDL 38.20 (L) 02/08/2021 1134   HDL 44 02/22/2018 1002   CHOLHDL 4 02/08/2021 1134   VLDL 38.0 02/08/2021 1134   LDLCALC 78 02/08/2021 1134   LDLCALC 115 (H) 02/22/2018 1002  LDLDIRECT 120.5 09/30/2013 1400    Additional studies/ records that were reviewed today include:  ETT 04/21/16: Study Highlights   There was no ST segment deviation noted during stress.   Ambient PVC;s at rest, with stress and recovery No significant NSVT No ischemia at 13.2 METS and 102% of PMHR   Echo: 04/21/16:  Study Conclusions   - Left ventricle: The cavity size was normal. Wall thickness was   normal. Basal to mid inferior akinesis. Basal to mid   inferolateral akinesis. Anterolateral hypokinesis. Systolic   function was mildly to moderately reduced. The estimated ejection   fraction was in the range of 40% to 45%. Doppler parameters are   consistent with abnormal left ventricular relaxation (grade 1   diastolic dysfunction). - Aortic valve: There was no stenosis. - Aorta: Mildly dilated aortic root. Aortic root dimension: 38 mm   (ED). - Mitral valve: Mildly calcified annulus. There was trivial   regurgitation. - Left atrium: The atrium was mildly dilated. - Right ventricle: The cavity size was normal. Systolic function   was mildly reduced. - Tricuspid valve: Peak RV-RA gradient (S): 16 mm Hg. - Pulmonary arteries: PA peak pressure: 19 mm Hg (S). - Inferior vena cava: The vessel was normal in size. The   respirophasic diameter changes were in the normal range (= 50%),   consistent with normal central venous pressure.   Impressions:   - Normal LV size with EF 40-45%. Wall motion abnormalities as noted   above. Normal RV size with mildly decreased systolic function. No   significant valvular abnormalities.  Myoview 11/10/17: Study Highlights    Nuclear stress EF: 30%. The left ventricular ejection fraction is moderately decreased (30-44%). Defect 1: There is a defect present in the basal inferolateral and mid inferolateral location. Findings consistent with prior myocardial infarction. This is a high risk study.   High risk stress nuclear study due to severely reduced global systolic function. There is evidence of an inferolateral scar (previously seen), but there is no reversible ischemia. There is global hypokinesis, worse in the inferolateral wall. LVEF has decreased slightly from 2016 (previously 36%), otherwise little change.   Echo 03/16/20: IMPRESSIONS     1. Global  hypokinesis with akinesis of the inferobasal and inferolateral  walls; overall severe LV dysfunction; moderate LVE; mild LVH; grade 1  diastolic dysfunction; mildly dilated aortic root.   2. Left ventricular ejection fraction, by estimation, is 25 to 30%. The  left ventricle has severely decreased function. The left ventricle  demonstrates regional wall motion abnormalities (see scoring  diagram/findings for description). The left  ventricular internal cavity size was moderately dilated. There is mild  left ventricular hypertrophy. Left ventricular diastolic parameters are  consistent with Grade I diastolic dysfunction (impaired relaxation).   3. Right ventricular systolic function is normal. The right ventricular  size is normal.   4. The mitral valve is normal in structure. Trivial mitral valve  regurgitation. No evidence of mitral stenosis.   5. The aortic valve is tricuspid. Aortic valve regurgitation is not  visualized. No aortic stenosis is present.   6. Aortic dilatation noted. There is mild dilatation of the aortic root  measuring 38 mm.   08/13/20: Event monitor: Study Highlights  Normal sinus rhythm Paroxysmal atrial fibrillation with rapid response and some aberrantion NSVT up to 5 beats     ASSESSMENT:    1. Chronic systolic heart failure ([place])   2. Coronary artery disease involving native coronary artery of native heart  without angina pectoris   3. Paroxysmal atrial fibrillation ([facility])   4. Hyperlipidemia LDL goal <70      PLAN:   1.  CAD: s/p CABG 2000. Known atretic LIMA to LAD but other grafts patent by cath in 2015.  -He is on a beta-blocker - no significant angina. Class 1.  - off ASA since on Eliquis  2.  Chronic systolic CHF: EF 73-71% -He is on the carvedilol and Entresto, compliant with both. - intolerant  of aldactone 25 mg daily due to depression -  He did not tolerate a higher dose of Entresto. - refused ICD - will continue current therapy    3.  Hypertension: -BP is well controlled.    4.  Hyperlipidemia: -He is not on a statin, but is taking coenzyme Q 10 and niacin. -Target LDL is less than 70   5.  Paroxysmal Afib - significant improvement of symptoms on amiodarone. Rate controlled on Coreg.  - notes from Dr [name] reviewed.  - [place] vasc score of 4- discussed recommendation for long term anticoagulation to reduce risk of embolic CVA. On Eliquis.  - will reduce amiodarone to 300 mg daily.  - labs are OK.  - follow up in 6 months.  6. Depression. Will start Zoloft 25 mg daily. May need to titrate dose to peak effect. Improved.    Severn Goddard Martinique, MD ,[facility] 10/05/2021 3:24 PM    [facility] [address] [place], [place], [address] [phone]

## 2021-10-05 ENCOUNTER — Other Ambulatory Visit: Payer: Self-pay

## 2021-10-05 ENCOUNTER — Ambulatory Visit (INDEPENDENT_AMBULATORY_CARE_PROVIDER_SITE_OTHER): Payer: Medicare Other | Admitting: Cardiology

## 2021-10-05 ENCOUNTER — Encounter: Payer: Self-pay | Admitting: Cardiology

## 2021-10-05 VITALS — BP 110/66 | HR 61 | Wt 190.2 lb

## 2021-10-05 DIAGNOSIS — E785 Hyperlipidemia, unspecified: Secondary | ICD-10-CM

## 2021-10-05 DIAGNOSIS — I480 Paroxysmal atrial fibrillation: Secondary | ICD-10-CM

## 2021-10-05 DIAGNOSIS — I2510 Atherosclerotic heart disease of native coronary artery without angina pectoris: Secondary | ICD-10-CM | POA: Diagnosis not present

## 2021-10-05 DIAGNOSIS — I5022 Chronic systolic (congestive) heart failure: Secondary | ICD-10-CM | POA: Diagnosis not present

## 2021-10-05 MED ORDER — AMIODARONE HCL 200 MG PO TABS
300.0000 mg | ORAL_TABLET | Freq: Every day | ORAL | 3 refills | Status: DC
Start: 2021-10-05 — End: 2022-04-06

## 2021-10-05 MED ORDER — ENTRESTO 24-26 MG PO TABS
1.0000 | ORAL_TABLET | Freq: Two times a day (BID) | ORAL | 3 refills | Status: DC
Start: 2021-10-05 — End: 2022-10-07

## 2021-10-05 NOTE — Patient Instructions (Signed)
Reduce amiodarone to 300 mg daily  Continue your other therapy

## 2021-10-19 DIAGNOSIS — G43709 Chronic migraine without aura, not intractable, without status migrainosus: Secondary | ICD-10-CM | POA: Diagnosis not present

## 2021-10-19 DIAGNOSIS — I10 Essential (primary) hypertension: Secondary | ICD-10-CM | POA: Diagnosis not present

## 2021-10-27 ENCOUNTER — Telehealth: Payer: Self-pay

## 2021-10-27 NOTE — Progress Notes (Signed)
Chronic Care Management Pharmacy Assistant   Name: Keith Vazquez  MRN: [id number] DOB: 1951-10-14  Reason for Encounter: Disease State-General Adherence Appointment: Telephone Feb 28, 2022 @ 10 am  Recent office visits:  04/07/21 Ronnald Ramp (PCP) - Primary osteoarthritis of both knees. Referral to Sports Medicine. D/c Celcoxib 200 mg.  02/08/21 Ronnald Ramp (PCP) - Essential hypertension. Start Apixaban 5 mg, Cefdinir 300 mg, Rosuvastatin 5 mg. D/c Hydrocodone-acetaminophen 7.5-325 mg & Montelukast sodium 10 mg.  6/7/21Jones  (PCP) - Left lumbar radiculitis. Start Medrol dosepak 4 mg, tramadol 50 mg. Increase Dicyclomine to 20 mg. D/c Duloxetine & Spironolactone.  03/16/20 Valere Dross, [place] (PCP) - Chronic low back pain. Start Duloxetine 30 mg.  Recent consult visits:  06/28/21 Martinique (Cardiology) - Chronic systolic heart failure. Start Amiodarone 300 mg. EKG 12-lead.  07/22/21 Georgina Snell (Sports Med) - Chronic pain of right knee.No med changes.  07/15/21 Georgina Snell (Sports Med) - Chronic pain of right knee.No med changes.  07/08/21 Georgina Snell (Sports Med) - Chronic pain of right knee.No med changes.  06/28/21 Martinique (Cardiology) - Chronic systolic heart failure. Start Amiodarone 400 mg & Sertraline 25 mg. D/c Escitalopram 5 mg.  04/13/21 Georgina Snell (Sports Med) - Acute pain of right knee. Start Prednisone 50 mg.  01/18/21 Smith (Sports Med) - Right wrist pain. Referral to PT and Ortho surgery. Start Gabapentin 200 mg.  12/14/20 [facility]) - Essential hypertension.  12/07/20 Hardin Negus (Anesthesiology) - Spondylosis without myelopathy or radiculopathy, lumbar region.  09/14/20 Martinique (Cardiology) - Chronic systolic heart failure. Start D/c Spironolactone 25 mg, Tramadol 50 mg.  08/13/20 [name] (Cardiology) - Paroxysmal atrial fibrillation. EKG 12-lead. No med changes.  08/06/20 [place] (Cardiology) - Unspecified atrial fibrillation.  07/1420 Kroeger, PA (Cardiology) - Paroxysmal atrial fibrillation.  Start Apixaban 5 mg. EKG 12-lead.  06/25/20 Raulkar (Rehab) - Foraminal stenosis of lumbar region. No med changes.  05/26/20 Martinique (Cardiology) - Chronic systolic heart failure. Start Spironolactone 25 mg.  04/09/20 Hardin Negus (Anesthesiology) - Spondylosis without myelopathy or radiculopathy, lumbar region.  03/25/20 [place] (Cardiology) - Chronic systolic heart failure. Start Spironolactone 25 mg. EKG 12-lead.  03/11/20 Eulas Post (Ophthalmology) - Age-related nuclear cataract, bilateral.   02/17/20 Eulas Post (Ophthalmology) - Age-related nuclear cataract, bilateral.   02/05/20 [place] (Cardiology) - Chronic systolic heart failure. EKG 12-lead. No med changes.  Hospital visits:  None in previous 6 months  Medications: Outpatient Encounter Medications as of 10/27/2021  Medication Sig   amiodarone (PACERONE) 200 MG tablet Take 1.5 tablets (300 mg total) by mouth daily.   apixaban (ELIQUIS) 5 MG TABS tablet Take 1 tablet (5 mg total) by mouth 2 (two) times daily.   Ascorbic Acid (VITAMIN C) 1000 MG tablet Take 1,000 mg by mouth daily.   b complex vitamins tablet Take 1 tablet by mouth daily.   carvedilol (COREG) 12.5 MG tablet Take 1.5 tablets (18.75 mg total) by mouth 2 (two) times daily with a meal.   cholecalciferol (VITAMIN D3) 25 MCG (1000 UNIT) tablet Take 1,000 Units by mouth daily.   co-enzyme Q-10 30 MG capsule Take 30 mg by mouth daily.   dicyclomine (BENTYL) 20 MG tablet Take 20 mg by mouth 4 (four) times daily.   gabapentin (NEURONTIN) 100 MG capsule Take 2-3 capsules (200-300 mg total) by mouth at bedtime.   niacin 500 MG tablet Take 500 mg by mouth at bedtime.   omeprazole (PRILOSEC) 10 MG capsule Take 1 capsule (10 mg total) by mouth daily.   Probiotic Product (PROBIOTIC-10 PO) Take 1 capsule  by mouth daily.    rosuvastatin (CRESTOR) 5 MG tablet Take 1 tablet (5 mg total) by mouth daily.   sacubitril-valsartan (ENTRESTO) 24-26 MG Take 1 tablet by mouth 2 (two) times daily.   sertraline  (ZOLOFT) 25 MG tablet Take 1 tablet (25 mg total) by mouth daily.   SUMAtriptan (IMITREX) 100 MG tablet TAKE 1 TABLET AT ONSET OF MIGRAINE- MAY REPEAT ONCE IN 2 HOURS. LIMIT 2/24 HOURS. AVOID DAILY USE.   TURMERIC PO Take 1 tablet by mouth daily.   vitamin E 200 UNIT capsule Take 200 Units by mouth daily.   No facility-administered encounter medications on file as of 10/27/2021.   Reviewed chart for medication changes and drug therapy problems ahead of medication adherence call.  Attempted to contact patient for medication review and health check, unable to reach patient, left voicemails to return call.  Care Gaps Colonoscopy - 05/10/2018 Diabetic Foot Exam - NA Mammogram - NA Ophthalmology - 02/17/20 Dexa Scan - NA Annual Well Visit - NA Micro albumin - NA Hemoglobin A1c - NA  Star Rating Drugs: Rouvastatin - 02/08/21 (Mail Order) [place] - 10/05/21 [place], [place] Clinical Pharmacists Assistant [phone]

## 2021-12-04 DIAGNOSIS — Z20822 Contact with and (suspected) exposure to covid-19: Secondary | ICD-10-CM | POA: Diagnosis not present

## 2021-12-06 DIAGNOSIS — Z20822 Contact with and (suspected) exposure to covid-19: Secondary | ICD-10-CM | POA: Diagnosis not present

## 2021-12-15 ENCOUNTER — Encounter: Payer: Self-pay | Admitting: Family Medicine

## 2021-12-29 ENCOUNTER — Ambulatory Visit: Payer: Medicare Other | Admitting: Family Medicine

## 2021-12-29 DIAGNOSIS — Z20822 Contact with and (suspected) exposure to covid-19: Secondary | ICD-10-CM | POA: Diagnosis not present

## 2022-01-12 ENCOUNTER — Ambulatory Visit (INDEPENDENT_AMBULATORY_CARE_PROVIDER_SITE_OTHER): Payer: Medicare Other | Admitting: Family Medicine

## 2022-01-12 ENCOUNTER — Other Ambulatory Visit: Payer: Self-pay

## 2022-01-12 ENCOUNTER — Ambulatory Visit (INDEPENDENT_AMBULATORY_CARE_PROVIDER_SITE_OTHER): Payer: Medicare Other

## 2022-01-12 VITALS — BP 112/80 | HR 77 | Ht 73.0 in | Wt 195.2 lb

## 2022-01-12 DIAGNOSIS — S9030XA Contusion of unspecified foot, initial encounter: Secondary | ICD-10-CM | POA: Diagnosis not present

## 2022-01-12 DIAGNOSIS — M79671 Pain in right foot: Secondary | ICD-10-CM | POA: Diagnosis not present

## 2022-01-12 DIAGNOSIS — Z20828 Contact with and (suspected) exposure to other viral communicable diseases: Secondary | ICD-10-CM | POA: Diagnosis not present

## 2022-01-12 NOTE — Patient Instructions (Addendum)
Good to see you ? ?Wear the CAM boot ? ?See me again on 2/24 @ 3:45 ?
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
## 2022-01-12 NOTE — Assessment & Plan Note (Signed)
Patient does have some swelling of the foot.  Did have a blood thinner he is taking.  Patient does have some mild bruising but overall seems to be improving he states over the course of last several hours.  Discussed with him to continue with elevation, compression.  Expect bruising to recur.  Patient given a cam walker secondary to the severity of pain but hopefully he will do relatively well.  X-rays were taken by [place] today and independently visualized by me but did not show any new acute fracture but likely did have the soft tissue swelling overlying the navicular bone.  Patient will follow-up with me again in 7 to 10 days.  Patient knows if any worsening pain to seek medical attention immediately ?

## 2022-01-17 DIAGNOSIS — Z20822 Contact with and (suspected) exposure to covid-19: Secondary | ICD-10-CM | POA: Diagnosis not present

## 2022-01-20 ENCOUNTER — Other Ambulatory Visit: Payer: Self-pay

## 2022-01-20 ENCOUNTER — Ambulatory Visit (INDEPENDENT_AMBULATORY_CARE_PROVIDER_SITE_OTHER): Payer: Medicare Other

## 2022-01-20 ENCOUNTER — Encounter: Payer: Self-pay | Admitting: Family Medicine

## 2022-01-20 ENCOUNTER — Ambulatory Visit (INDEPENDENT_AMBULATORY_CARE_PROVIDER_SITE_OTHER): Payer: Medicare Other | Admitting: Family Medicine

## 2022-01-20 ENCOUNTER — Ambulatory Visit: Payer: Self-pay

## 2022-01-20 VITALS — BP 120/80 | HR 74 | Ht 73.0 in | Wt 194.0 lb

## 2022-01-20 DIAGNOSIS — M25561 Pain in right knee: Secondary | ICD-10-CM | POA: Diagnosis not present

## 2022-01-20 DIAGNOSIS — G8929 Other chronic pain: Secondary | ICD-10-CM

## 2022-01-20 DIAGNOSIS — S92531A Displaced fracture of distal phalanx of right lesser toe(s), initial encounter for closed fracture: Secondary | ICD-10-CM

## 2022-01-20 DIAGNOSIS — S9781XA Crushing injury of right foot, initial encounter: Secondary | ICD-10-CM | POA: Diagnosis not present

## 2022-01-20 DIAGNOSIS — M79671 Pain in right foot: Secondary | ICD-10-CM

## 2022-01-20 DIAGNOSIS — M1711 Unilateral primary osteoarthritis, right knee: Secondary | ICD-10-CM | POA: Diagnosis not present
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
## 2022-01-20 NOTE — Patient Instructions (Addendum)
Good to see you today. ? ?You had a R knee Monovisc injection.  Call or go to the ER if you develop a large red swollen joint with extreme pain or oozing puss.  ? ?I've referred you to Podiatry. Their office will call you to schedule but please let us know if you haven't heard from them in one week regarding scheduling. ? ?Follow-up: as needed ?

## 2022-01-21 ENCOUNTER — Ambulatory Visit: Payer: Medicare Other | Admitting: Family Medicine

## 2022-01-24 NOTE — Progress Notes (Signed)
The third and fourth toes are broken.  The third toe is very displaced.  We talked about this in clinic the other day.  Radiology did not see fracture in the midfoot.

## 2022-01-25 ENCOUNTER — Other Ambulatory Visit: Payer: Self-pay

## 2022-01-25 ENCOUNTER — Ambulatory Visit (INDEPENDENT_AMBULATORY_CARE_PROVIDER_SITE_OTHER): Payer: Medicare Other | Admitting: Podiatry

## 2022-01-25 DIAGNOSIS — S92911A Unspecified fracture of right toe(s), initial encounter for closed fracture: Secondary | ICD-10-CM | POA: Diagnosis not present

## 2022-01-26 ENCOUNTER — Telehealth: Payer: Self-pay | Admitting: Cardiology

## 2022-01-26 ENCOUNTER — Telehealth: Payer: Self-pay | Admitting: *Deleted

## 2022-01-26 DIAGNOSIS — Z20822 Contact with and (suspected) exposure to covid-19: Secondary | ICD-10-CM | POA: Diagnosis not present

## 2022-01-26 DIAGNOSIS — I480 Paroxysmal atrial fibrillation: Secondary | ICD-10-CM

## 2022-01-26 NOTE — Telephone Encounter (Signed)
Clinical pharmacist to review Eliquis 

## 2022-01-26 NOTE — Telephone Encounter (Signed)
? ?  Pre-operative Risk Assessment  ?  ?Patient Name: Keith Vazquez  ?DOB: Dec 19, 1950 ?MRN: [id number]  ? ?  ? ?Request for Surgical Clearance   ? ?Procedure:  Open reduction of internal fracture, 3rd & 4th toe, R Foot  ? ?Date of Surgery:  Clearance 02/03/22                              ?   ?Surgeon:  Dr. Daylene Katayama  ?Surgeon's Group or Practice Name:  [facility] ?Phone number:  [phone] ?Fax number:  [phone] ?  ?Type of Clearance Requested:   ?- Medical  ?- Pharmacy:  Meds to hold TBD by Cardiology ?  ?Type of Anesthesia:  Choice  ?  ?Additional requests/questions:   ? ?Signed, ?Johnna Acosta   ?01/26/2022, 10:47 AM   ?

## 2022-01-26 NOTE — Telephone Encounter (Signed)
Spoke with patient and scheduled him for a telehealth visit on 01/28/2022 at 3:40 PM. He reports that he has not taken eliquis for a while. ?

## 2022-01-26 NOTE — Telephone Encounter (Signed)
Patient with diagnosis of afib on Eliquis for anticoagulation.   ? ?Procedure: Open reduction of internal fracture, 3rd & 4th toe, R Foot  ?Date of procedure: 02/03/22 ? ?CHA2DS2-VASc Score = 4  ?This indicates a 4.8% annual risk of stroke. ?The patient's score is based upon: ?CHF History: 1 ?HTN History: 1 ?Diabetes History: 0 ?Stroke History: 0 ?Vascular Disease History: 1 ?Age Score: 1 ?Gender Score: 0 ?  ?CrCl 63m/min ?Platelet count 162K ? ?Per office protocol, patient can hold Eliquis for 2 days prior to procedure.   ?

## 2022-01-26 NOTE — Telephone Encounter (Signed)
?  Patient Consent for Virtual Visit  ? ? ?   ? ?[name] has provided verbal consent on 01/26/2022 for a virtual visit (video or telephone). ? ? ?CONSENT FOR VIRTUAL VISIT FOR:  Keith Vazquez  ?By participating in this virtual visit I agree to the following: ? ?I hereby voluntarily request, consent and authorize [facility] and its employed or contracted physicians, physician assistants, nurse practitioners or other licensed health care professionals (the Practitioner), to provide me with telemedicine health care services (the ?Services") as deemed necessary by the treating Practitioner. I acknowledge and consent to receive the Services by the Practitioner via telemedicine. I understand that the telemedicine visit will involve communicating with the Practitioner through live audiovisual communication technology and the disclosure of certain medical information by electronic transmission. I acknowledge that I have been given the opportunity to request an in-person assessment or other available alternative prior to the telemedicine visit and am voluntarily participating in the telemedicine visit. ? ?I understand that I have the right to withhold or withdraw my consent to the use of telemedicine in the course of my care at any time, without affecting my right to future care or treatment, and that the Practitioner or I may terminate the telemedicine visit at any time. I understand that I have the right to inspect all information obtained and/or recorded in the course of the telemedicine visit and may receive copies of available information for a reasonable fee.  I understand that some of the potential risks of receiving the Services via telemedicine include:  ?Delay or interruption in medical evaluation due to technological equipment failure or disruption; ?Information transmitted may not be sufficient (e.g. poor resolution of images) to allow for appropriate medical decision making by the Practitioner;  and/or  ?In rare instances, security protocols could fail, causing a breach of personal health information. ? ?Furthermore, I acknowledge that it is my responsibility to provide information about my medical history, conditions and care that is complete and accurate to the best of my ability. I acknowledge that Practitioner's advice, recommendations, and/or decision may be based on factors not within their control, such as incomplete or inaccurate data provided by me or distortions of diagnostic images or specimens that may result from electronic transmissions. I understand that the practice of medicine is not an exact science and that Practitioner makes no warranties or guarantees regarding treatment outcomes. I acknowledge that a copy of this consent can be made available to me via my patient portal ([facility]), or I can request a printed copy by calling the office of [facility].   ? ?I understand that my insurance will be billed for this visit.  ? ?I have read or had this consent read to me. ?I understand the contents of this consent, which adequately explains the benefits and risks of the Services being provided via telemedicine.  ?I have been provided ample opportunity to ask questions regarding this consent and the Services and have had my questions answered to my satisfaction. ?I give my informed consent for the services to be provided through the use of telemedicine in my medical care ? ? ? ?

## 2022-01-26 NOTE — Telephone Encounter (Signed)
Please set up telephone virtual visit.  Since the patient need to hold Eliquis for 2 days, telephone visit should be on 4/3 or earlier. ?

## 2022-01-27 ENCOUNTER — Encounter: Payer: Self-pay | Admitting: Podiatry

## 2022-01-28 ENCOUNTER — Ambulatory Visit (INDEPENDENT_AMBULATORY_CARE_PROVIDER_SITE_OTHER): Payer: Medicare Other | Admitting: General Practice

## 2022-01-28 ENCOUNTER — Encounter: Payer: Self-pay | Admitting: Cardiology

## 2022-01-28 DIAGNOSIS — Z01810 Encounter for preprocedural cardiovascular examination: Secondary | ICD-10-CM

## 2022-01-28 NOTE — Progress Notes (Addendum)
? ?Virtual Visit via Telephone Note  ? ?This visit type was conducted due to national recommendations for restrictions regarding the COVID-19 Pandemic (e.g. social distancing) in an effort to limit this patient's exposure and mitigate transmission in our community.  Due to his co-morbid illnesses, this patient is at least at moderate risk for complications without adequate follow up.  This format is felt to be most appropriate for this patient at this time.  The patient did not have access to video technology/had technical difficulties with video requiring transitioning to audio format only (telephone).  All issues noted in this document were discussed and addressed.  No physical exam could be performed with this format.  Please refer to the patient's chart for his  consent to telehealth for [facility]. ? ?Evaluation Performed:  Preoperative cardiovascular risk assessment ?_____________  ? ?Date:  01/28/2022  ? ?Patient ID:  Keith, Vazquez 1950/12/04, MRN [id number] ?Patient Location:  ?Home ?Provider location:   ?Office ? ?Primary Care Provider:  [name], MD ?Primary Cardiologist:  Peter Martinique, MD ? ?Chief Complaint  ?  ?71 y.o. y/o male with a h/o atrial flutter, CHF, coronary artery disease status post CABG x5 in 2000/catheterization 2002 4/5 grafts patent, HTN, who is pending ORIF third and fourth toe right foot, and presents today for telephonic preoperative cardiovascular risk assessment. ? ?Past Medical History  ?  ?Past Medical History:  ?Diagnosis Date  ? Atrial flutter ([place])   ? CHF (congestive heart failure) ([place])   ? LVEF at time of cath 2002 50%; now has been 35-40% since 2008 (patient has seen multiple cardiologists over the years including [facility], [place], [place] [facility], [facility] and Tollie Eth, MD)  ? Coronary artery disease   ? CABG x5 in 2000; s/p cath 2002 > 4/5 grafts patent, LVEF 50%  ? Degeneration of lumbar intervertebral disc   ? GERD  (gastroesophageal reflux disease)   ? GI bleed   ? duodenal ulcer  ? Hypertension   ? Borderline  ? Ischemic heart disease   ? s/p MI, CABG 2000, s/p cath 2002 with 4/5 grafts patent (LIMA to LAD atretic and occluded mid vessel but adequate flow to distal LAD from collaterals / diagonal  ? Low back pain   ? PAC (premature atrial contraction)   ? PAF (paroxysmal atrial fibrillation) ([place])   ? initially diagnosed during stress test 2002  ? Peyronie's disease   ? ?Past Surgical History:  ?Procedure Laterality Date  ? ABLATION  05-06-2014  ? PVI and CTI ablation by Dr [name]  ? ATRIAL FIBRILLATION ABLATION N/A 05/06/2014  ? Procedure: ATRIAL FIBRILLATION ABLATION;  Surgeon: [name], MD;  Location: [place] CATH LAB;  Service: Cardiovascular;  Laterality: N/A;  ? CARDIAC CATHETERIZATION  09/14/2001  ?  Mildly decreased left ventricular systolic function --  Native three vessel coronary artery disease as described. -- Status post coronary artery bypass grafting  ? CORONARY ARTERY BYPASS GRAFT  2000  ? LIMA to LAD, SVG to second diagonal, seq SVG to ramus and OM, SVG to PDA  ? HERNIA REPAIR  05/16/2001  ? Large left indirect inguinal hernia with right direct hernia.  ? HERNIA REPAIR    ? Recurrent left inguinal hernia -- Large left indirect inguinal hernia with right direct hernia.  ? LEFT HEART CATHETERIZATION WITH CORONARY/GRAFT ANGIOGRAM  11/26/2013  ? Procedure: LEFT HEART CATHETERIZATION WITH [name];  Surgeon: Peter M Martinique, MD;  Location: [facility] CATH LAB;  Service: Cardiovascular;;  ? TEE WITHOUT CARDIOVERSION N/A 05/06/2014  ? Procedure: TRANSESOPHAGEAL ECHOCARDIOGRAM (TEE);  Surgeon: [name], MD;  Location: [place];  Service: Cardiovascular;  Laterality: N/A;  ? ? ?Allergies ? ?Allergies  ?Allergen Reactions  ? Amiodarone Other (See Comments)  ?  Left permanent scar on eye  ? Oxycodone-Acetaminophen Nausea Only  ? Propoxyphene N-Acetaminophen Nausea Only  ? ? ?History of Present Illness  ?   ?Keith Vazquez is a 71 y.o. male who presents via audio/video conferencing for a telehealth visit today.  Pt was last seen in cardiology clinic on 10/05/2021 by Dr. Martinique.  At that time Keith Vazquez was doing well .  The patient is now pending ORIF third and fourth toe right foot.  Since his last visit, he remains stable from a cardiac standpoint.  Patient acting very irritated and rude on the phone.  "  Reports that he does not feel he is a sick cardiac patient and is planning on going to the gym as soon as he gets off the phone.  Patient also reports that he does not take Eliquis and has not taken Eliquis in 2 years."  I was unable to provide recommendations for holding Eliquis on review increased CVA risk due to patient hanging up the phone before conversation could be had. ? ?Today he denies chest pain, shortness of breath, lower extremity edema, fatigue, palpitations, melena, hematuria, hemoptysis, diaphoresis, weakness, presyncope, syncope, orthopnea, and PND. ? ? ? ?Home Medications  ?  ?Prior to Admission medications   ?Medication Sig Start Date End Date Taking? Authorizing Provider  ?amiodarone (PACERONE) 200 MG tablet Take 1.5 tablets (300 mg total) by mouth daily. 10/05/21   Martinique, Peter M, MD  ?apixaban (ELIQUIS) 5 MG TABS tablet Take 1 tablet (5 mg total) by mouth 2 (two) times daily. ?Patient not taking: Reported on 01/26/2022 02/08/21   [name], MD  ?Ascorbic Acid (VITAMIN C) 1000 MG tablet Take 1,000 mg by mouth daily.    [provider]  ?b complex vitamins tablet Take 1 tablet by mouth daily.    [provider]  ?carvedilol (COREG) 12.5 MG tablet Take 1.5 tablets (18.75 mg total) by mouth 2 (two) times daily with a meal. 06/28/21   Martinique, Peter M, MD  ?cholecalciferol (VITAMIN D3) 25 MCG (1000 UNIT) tablet Take 1,000 Units by mouth daily.    [provider]  ?co-enzyme Q-10 30 MG capsule Take 30 mg by mouth daily.    [provider]  ?dicyclomine  (BENTYL) 20 MG tablet Take 20 mg by mouth 4 (four) times daily. 02/25/20   [provider]  ?gabapentin (NEURONTIN) 100 MG capsule Take 2-3 capsules (200-300 mg total) by mouth at bedtime. ?Patient not taking: Reported on 01/26/2022 10/01/21   Lyndal Pulley, DO  ?Galcanezumab-gnlm (EMGALITY Botines) Inject into the skin.    [provider]  ?niacin 500 MG tablet Take 500 mg by mouth at bedtime.    [provider]  ?omeprazole (PRILOSEC) 10 MG capsule Take 1 capsule (10 mg total) by mouth daily. 08/23/18   Esterwood, Amy S, PA-C  ?Probiotic Product (PROBIOTIC-10 PO) Take 1 capsule by mouth daily.     [provider]  ?rosuvastatin (CRESTOR) 5 MG tablet Take 1 tablet (5 mg total) by mouth daily. ?Patient not taking: Reported on 01/26/2022 02/09/21   [name], MD  ?sacubitril-valsartan (ENTRESTO) 24-26 MG Take 1 tablet by mouth 2 (two) times daily. 10/05/21  Martinique, Peter M, MD  ?sertraline (ZOLOFT) 25 MG tablet Take 1 tablet (25 mg total) by mouth daily. ?Patient not taking: Reported on 01/26/2022 06/28/21   Martinique, Peter M, MD  ?SUMAtriptan (IMITREX) 100 MG tablet TAKE 1 TABLET AT ONSET OF MIGRAINE- MAY REPEAT ONCE IN 2 HOURS. LIMIT 2/24 HOURS. AVOID DAILY USE. 04/07/21   [name], MD  ?TURMERIC PO Take 1 tablet by mouth daily.    [provider]  ?vitamin E 200 UNIT capsule Take 200 Units by mouth daily.    [provider]  ? ? ?Physical Exam  ?  ?Vital Signs:  Keith Vazquez does not have vital signs available for review today. ? ?Given telephonic nature of communication, physical exam is limited. ?AAOx3. NAD. Normal affect.  Speech and respirations are unlabored. ? ?Accessory Clinical Findings  ?  ?None ? ?Assessment & Plan  ?  ?1.  Preoperative Cardiovascular Risk Assessment: ? ? ? ?Primary Cardiologist: Peter Martinique, MD ? ?Chart reviewed as part of pre-operative protocol coverage. Given past medical history and time since last visit, based on ACC/AHA  guidelines, Keith Vazquez would be at acceptable risk for the planned procedure without further cardiovascular testing.  ? ?Patient was advised that if he develops new symptoms prior to surgery to contact our

## 2022-01-29 DIAGNOSIS — Z20822 Contact with and (suspected) exposure to covid-19: Secondary | ICD-10-CM | POA: Diagnosis not present
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
## 2022-01-31 NOTE — Telephone Encounter (Signed)
Spoke with patient.  He is not really having any significant toe pain from the toe fractures.  We are going to cancel his surgery and simply observe for now.  Thanks, Dr. Amalia Hailey

## 2022-02-03 DIAGNOSIS — Z20828 Contact with and (suspected) exposure to other viral communicable diseases: Secondary | ICD-10-CM | POA: Diagnosis not present

## 2022-02-07 DIAGNOSIS — Z20822 Contact with and (suspected) exposure to covid-19: Secondary | ICD-10-CM | POA: Diagnosis not present

## 2022-02-08 DIAGNOSIS — Z20822 Contact with and (suspected) exposure to covid-19: Secondary | ICD-10-CM | POA: Diagnosis not present

## 2022-02-10 ENCOUNTER — Encounter: Payer: Medicare Other | Admitting: Podiatry

## 2022-02-17 ENCOUNTER — Other Ambulatory Visit: Payer: Self-pay | Admitting: Family Medicine

## 2022-02-17 DIAGNOSIS — R519 Headache, unspecified: Secondary | ICD-10-CM

## 2022-02-17 DIAGNOSIS — T1490XA Injury, unspecified, initial encounter: Secondary | ICD-10-CM

## 2022-02-18 ENCOUNTER — Encounter: Payer: Medicare Other | Admitting: Podiatry

## 2022-02-26 ENCOUNTER — Ambulatory Visit
Admission: RE | Admit: 2022-02-26 | Discharge: 2022-02-26 | Disposition: A | Discharge status: Home / Self Care | Payer: Medicare Other | Source: Ambulatory Visit | Attending: Family Medicine | Admitting: Family Medicine

## 2022-02-26 DIAGNOSIS — M7989 Other specified soft tissue disorders: Secondary | ICD-10-CM | POA: Diagnosis not present

## 2022-02-26 DIAGNOSIS — R519 Headache, unspecified: Secondary | ICD-10-CM

## 2022-02-26 DIAGNOSIS — T1490XA Injury, unspecified, initial encounter: Secondary | ICD-10-CM

## 2022-02-26 DIAGNOSIS — S92511A Displaced fracture of proximal phalanx of right lesser toe(s), initial encounter for closed fracture: Secondary | ICD-10-CM | POA: Diagnosis not present

## 2022-02-28 ENCOUNTER — Telehealth: Payer: Medicare Other

## 2022-02-28 DIAGNOSIS — Z20822 Contact with and (suspected) exposure to covid-19: Secondary | ICD-10-CM | POA: Diagnosis not present

## 2022-02-28 NOTE — Progress Notes (Deleted)
Chronic Care Management Pharmacy Note  02/28/2022 Name:  Keith Vazquez MRN:  [id number] DOB:  [birth year]  Summary: ***  Recommendations/Changes made from today's visit: ***  Plan: ***  Subjective: Keith Vazquez is an 71 y.o. year old male who is a primary patient of [name], MD.  The CCM team was consulted for assistance with disease management and care coordination needs.    Engaged with patient by telephone for follow up visit in response to provider referral for pharmacy case management and/or care coordination services.   Consent to Services:  The patient was given the following information about Chronic Care Management services today, agreed to services, and gave verbal consent: 1. CCM service includes personalized support from designated clinical staff supervised by the primary care provider, including individualized plan of care and coordination with other care providers 2. 24/7 contact phone numbers for assistance for urgent and routine care needs. 3. Service will only be billed when office clinical staff spend 20 minutes or more in a month to coordinate care. 4. Only one practitioner may furnish and bill the service in a calendar month. 5.The patient may stop CCM services at any time (effective at the end of the month) by phone call to the office staff. 6. The patient will be responsible for cost sharing (co-pay) of up to 20% of the service fee (after annual deductible is met). Patient agreed to services and consent obtained.  Patient Care Team: [name], MD as PCP - General (Internal Medicine) Martinique, Peter M, MD as PCP - Cardiology (Cardiology) Thompson Grayer, MD as PCP - Electrophysiology (Cardiology) Charlton Haws, [facility] (Pharmacist) Barrett, Felisa Bonier as Physician Assistant (Cardiology)  Recent office visits: N/a  Recent consult visits: 01/28/2022 - Coletta Memos NP - Cardiology  01/25/2022 - Dr. Amalia Hailey - Podiatry - fractures - digits 3,4 on  right foot  01/20/2022 - Dr. Georgina Snell - sports medicine - right foot crush injury and toe fracture - monovisc injection in right knee given today  01/12/2022 - Dr. Tamala Julian - sports medicine - acute right foot pain - CAM boot given - f/u in 7-10 days  10/05/2021 - Dr. Martinique - Cardiology - amiodarone 361m daily, start sertraline 214mdaily - f/u in 6 months   Hospital visits: None in previous 6 months  Objective:  Lab Results  Component Value Date   CREATININE 1.07 10/01/2021   BUN 17 10/01/2021   GFR 75.42 02/08/2021   GFRNONAA >60 04/24/2018   GFRAA >60 04/24/2018   NA 136 10/01/2021   K 4.2 10/01/2021   CALCIUM 9.3 10/01/2021   CO2 24 10/01/2021   GLUCOSE 93 10/01/2021    Lab Results  Component Value Date/Time   GFR 75.42 02/08/2021 11:34 AM   GFR 77.44 01/09/2019 09:57 AM    Last diabetic Eye exam:  No results found for: HMDIABEYEEXA  Last diabetic Foot exam:  No results found for: HMDIABFOOTEX   Lab Results  Component Value Date   CHOL 154 02/08/2021   HDL 38.20 (L) 02/08/2021   LDLCALC 78 02/08/2021   LDLDIRECT 120.5 09/30/2013   TRIG 190.0 (H) 02/08/2021   CHOLHDL 4 02/08/2021       Latest Ref Rng & Units 10/01/2021   10:58 AM 02/08/2021   11:34 AM 01/09/2019    9:57 AM  Hepatic Function  Total Protein 6.0 - 8.5 g/dL 7.3   7.2   7.0    Albumin 3.7 - 4.7 g/dL 4.6  4.1   4.3    AST 0 - 40 IU/L 25   18   19     ALT 0 - 44 IU/L 10   8   6     Alk Phosphatase 44 - 121 IU/L 92   77   73    Total Bilirubin 0.0 - 1.2 mg/dL 0.5   0.7   0.4    Bilirubin, Direct 0.0 - 0.3 mg/dL  0.1       Lab Results  Component Value Date/Time   TSH 3.150 10/01/2021 10:58 AM   TSH 1.90 02/08/2021 11:34 AM       Latest Ref Rng & Units 04/07/2021    9:26 AM 02/08/2021   11:34 AM 01/09/2019    9:57 AM  CBC  WBC 4.0 - 10.5 K/uL 6.7   11.0   6.5    Hemoglobin 13.0 - 17.0 g/dL 15.4   14.9   15.5    Hematocrit 39.0 - 52.0 % 46.6   44.5   45.4    Platelets 150.0 - 400.0 K/uL 162.0    166.0   162.0      No results found for: VD25OH  Clinical ASCVD: No  The 10-year ASCVD risk score (Arnett DK, et al., 2019) is: 22.7%   Values used to calculate the score:     Age: 28 years     Sex: Male     Is Non-Hispanic African American: No     Diabetic: No     Tobacco smoker: No     Systolic Blood Pressure: 250 mmHg     Is BP treated: Yes     HDL Cholesterol: 38.2 mg/dL     Total Cholesterol: 154 mg/dL       06/25/2020   12:09 PM 05/16/2019    4:32 PM 12/04/2016    1:13 PM  Depression screen PHQ 2/9  Decreased Interest 0 0 0  Down, Depressed, Hopeless 0 0 0  PHQ - 2 Score 0 0 0  Altered sleeping 0    Tired, decreased energy 0    Change in appetite 0    Feeling bad or failure about yourself  0    Trouble concentrating 0    Moving slowly or fidgety/restless 0    Suicidal thoughts 0    PHQ-9 Score 0     Social History   Tobacco Use  Smoking Status Former   Packs/day: 1.00   Years: 20.00   Pack years: 20.00   Types: Cigarettes   Quit date: 10/31/1994   Years since quitting: 27.3  Smokeless Tobacco Never   BP Readings from Last 3 Encounters:  01/20/22 120/80  01/12/22 112/80  10/05/21 110/66   Pulse Readings from Last 3 Encounters:  01/20/22 74  01/12/22 77  10/05/21 61   Wt Readings from Last 3 Encounters:  01/20/22 194 lb (88 kg)  01/12/22 195 lb 3.2 oz (88.5 kg)  10/05/21 190 lb 3.2 oz (86.3 kg)   BMI Readings from Last 3 Encounters:  01/20/22 25.60 kg/m  01/12/22 25.75 kg/m  10/05/21 25.09 kg/m    Assessment/Interventions: Review of patient past medical history, allergies, medications, health status, including review of consultants reports, laboratory and other test data, was performed as part of comprehensive evaluation and provision of chronic care management services.   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 Sig Documenting Provider Last Dose Status Informant  amiodarone (PACERONE) 200 MG tablet [id number] No Take 1.5 tablets (300 mg total) by mouth daily. Martinique, Peter M, MD Taking Active   apixaban (ELIQUIS) 5 MG TABS tablet [id number] No Take 1 tablet (5 mg total) by mouth 2 (two) times daily.  Patient not taking: Reported on 01/26/2022   [name], MD Not Taking Active            Med Note Lia Hopping, [place]   Wed Jan 26, 2022  3:15 PM) Pt states has not taken in a while 01/26/22  Ascorbic Acid (VITAMIN C) 1000 MG tablet [id number] No Take 1,000 mg by mouth daily. [provider] Taking Active   b complex vitamins tablet [id number] No Take 1 tablet by mouth daily. [provider] Taking Active   carvedilol (COREG) 12.5 MG tablet [id number] No Take 1.5 tablets (18.75 mg total) by mouth 2 (two) times daily with a meal. Martinique, Peter M, MD Taking Active   cholecalciferol (VITAMIN D3) 25 MCG (1000 UNIT) tablet [id number] No Take 1,000 Units by mouth daily. [provider] Taking Active   co-enzyme Q-10 30 MG capsule [id number] No Take 30 mg by mouth daily. [provider] Taking Active   dicyclomine (BENTYL) 20 MG tablet [id number] No Take 20 mg by mouth 4 (four) times daily.  [provider] Taking Active   gabapentin (NEURONTIN) 100 MG capsule [id number] No Take 2-3 capsules (200-300 mg total) by mouth at bedtime.  Patient not taking: Reported on 01/26/2022   Lyndal Pulley, DO Not Taking Active            Med Note Juventino Slovak   Wed Jan 26, 2022  3:15 PM) Pt states not taking 01/26/22  Galcanezumab-gnlm [facility] [place]) [id number] No Inject into the skin. [provider] Taking Active   niacin 500 MG tablet [id number] No Take 500 mg by mouth at bedtime. [provider] Taking Active   omeprazole (PRILOSEC) 10 MG capsule [id number] No Take 1 capsule (10 mg total) by mouth daily. Esterwood, Amy S, PA-C Taking Active   Probiotic Product (PROBIOTIC-10 PO) [id number] No Take 1 capsule by mouth daily.  [provider] Taking Active   rosuvastatin (CRESTOR) 5 MG tablet [id number] No Take 1 tablet (5 mg total) by mouth daily.  Patient not taking: Reported on 01/26/2022   [name], MD Not Taking Active            Med Note Lia Hopping, [place]   Wed Jan 26, 2022  3:17 PM) Pt states has not taken in a while 01/26/22  sacubitril-valsartan (ENTRESTO) 24-26 MG [id number] No Take 1 tablet by mouth 2 (two) times daily. Martinique, Peter M,  MD Taking Active   sertraline (ZOLOFT) 25 MG tablet [id number] No Take 1 tablet (25 mg total) by mouth daily.  Patient not taking: Reported on 01/26/2022   Martinique, Peter M, MD Not Taking Active            Med Note Juventino Slovak   Wed Jan 26, 2022  3:17 PM) Pt states not taking 01/26/22  SUMAtriptan (IMITREX) 100 MG tablet [id number] No TAKE 1 TABLET AT ONSET OF MIGRAINE- MAY REPEAT ONCE IN 2 HOURS. LIMIT 2/24 HOURS. AVOID DAILY USE. [name], MD Taking Active   TURMERIC PO [id number] No Take 1 tablet by mouth daily. [provider] Taking Active   vitamin E 200 UNIT capsule [id number] No Take 200 Units by mouth daily. [provider] Taking Active Self            Patient Active Problem List    Diagnosis Date Noted   Contusion of dorsum of foot 01/12/2022   Primary osteoarthritis of both knees 04/07/2021   PAF (paroxysmal atrial fibrillation) ([place])    Spinal stenosis of lumbar region with radiculopathy 04/06/2020   DDD (degenerative disc disease), lumbar 04/06/2020   Irritable bowel syndrome with constipation 05/16/2019   Anal sphincter spasm 03/26/2019   Seasonal and perennial allergic rhinitis 07/05/2018   Routine general medical examination at a health care facility 12/04/2016   Migraine without aura and with status migrainosus, not intractable 11/29/2016   Erectile dysfunction due to arterial insufficiency 11/29/2016   BPH (benign prostatic hyperplasia) 02/24/2015   Bilateral carpal tunnel syndrome 52/84/1324   Chronic systolic heart failure ([place]) 11/28/2013   Coronary artery disease    PUD (peptic ulcer disease) 07/22/2013   Hyperlipidemia with target low density lipoprotein (LDL) cholesterol less than 70 mg/dL 08/11/2010   ISCHEMIC CARDIOMYOPATHY 08/11/2010   Essential hypertension 08/10/2010   GERD 04/23/2008    Immunization History  Administered Date(s) Administered   Fluad Quad(high Dose 65+) 08/23/2019, 08/19/2021   Influenza Split 09/30/2014   Influenza, High Dose Seasonal PF 09/11/2015, 09/16/2016, 09/12/2017, 09/10/2018   Influenza-Unspecified 09/14/2020   Moderna Sars-Covid-2 Vaccination 10/08/2020   PFIZER(Purple Top)SARS-COV-2 Vaccination 12/06/2019, 12/27/2019   Pneumococcal Conjugate-13 05/16/2019   Pneumococcal Polysaccharide-23 04/07/2021   Pneumococcal-Unspecified 07/22/2012   Tdap 05/16/2019   Zoster Recombinat (Shingrix) 12/14/2020   Zoster, Live 05/22/2011    Conditions to be addressed/monitored:  {USCCMDZASSESSMENTOPTIONS:23563}  There are no care plans that you recently modified to display for this patient.     Medication Assistance: None required.  Patient affirms current coverage meets needs.  Care Gaps: Hep C screening  COVID  booster Shingrix   Patient's preferred pharmacy is:  [place], [place] [address] Phone: [phone] Fax: [phone]   Uses pill box? {Yes or If no, why not?:20788} Pt endorses ***% compliance  Care Plan and Follow Up Patient Decision:  {FOLLOWUP:24991}  Plan: {CM FOLLOW UP PPIR:51884}  ***  Current Barriers:  {pharmacybarriers:24917}  Pharmacist Clinical Goal(s):  Patient will {PHARMACYGOALCHOICES:24921} through collaboration with PharmD and provider.   Interventions: 1:1 collaboration with [name], MD regarding development and update of comprehensive plan of care as evidenced by provider attestation and co-signature Inter-disciplinary care team collaboration (see longitudinal plan of care) Comprehensive medication review performed; medication list updated in electronic medical record  Hypertension / CHF (BP goal <140/90) -{US controlled/uncontrolled:25276} -Current treatment: Carvedilol 12.33m - 1.5 tablets twice daily Entresto 24-222m-  1 tablet twicedaily  -Medications previously tried: ramipril, lisinopril, maxzide, spironolactone  -Current home readings: *** -Current dietary habits: *** -Current exercise habits: *** -{ACTIONS;DENIES/REPORTS:21021675::"Denies"} hypotensive/hypertensive symptoms -Educated on {CCM BP Counseling:25124} -Counseled to monitor BP at home ***, document, and provide log at future appointments -{CCMPHARMDINTERVENTION:25122}  Hyperlipidemia: (LDL goal < 70) -{US controlled/uncontrolled:25276} Lab Results  Component Value Date   LDLCALC 78 02/08/2021  -Current treatment: Rosuvastatin 68m daily  -Medications previously tried: atorvastatin, pravastatin   -Current dietary patterns: *** -Current exercise habits: *** -Educated on {CCM HLD Counseling:25126} -{CCMPHARMDINTERVENTION:25122}  Atrial Fibrillation (Goal: prevent stroke and  major bleeding) -{US controlled/uncontrolled:25276} CHADS2VASc score: Age (1 point), Heart failure history (1 point), and Hypertension history (1 point) -Current treatment: Rate control: Amiodarone 3021mdaily  Anticoagulation: Eliquis 46m32mwice daily  -Medications previously tried: n/a -Home BP and HR readings: ***  -Counseled on {CCMAFIBCOUNSELING:25120} -{CCMPHARMDINTERVENTION:25122}  Depression (Goal: Promotion of positive mood) -{US controlled/uncontrolled:25276} -Current treatment  Sertraline 246m50mily  -Medications previously tried: duloxetine, escitalopram   -{CCMPHARMDINTERVENTION:25122}  Health Maintenance -Vaccine gaps: Shingrix, COVID booster  -Current therapy:  Vitamin C 1000mg69mly  Vitamin B complex  Vitamin D3 1000units daily  Turmerc  CoQ10 30mg 446mbiotic Omeprazole 10mg  39mmin E 200 units  -Educated on {ccm supplement counseling:25128} -{CCM Patient satisfied:25129} -{CCMPHARMDINTERVENTION:25122}  Patient Goals/Self-Care Activities Patient will:  - {pharmacypatientgoals:24919}  Follow Up Plan: {CM FOLLOW UP PLAN:22JARW:11003}

## 2022-03-02 ENCOUNTER — Encounter: Payer: Self-pay | Admitting: Podiatry

## 2022-03-02 DIAGNOSIS — S92919A Unspecified fracture of unspecified toe(s), initial encounter for closed fracture: Secondary | ICD-10-CM | POA: Insufficient documentation

## 2022-03-02 DIAGNOSIS — S93324A Dislocation of tarsometatarsal joint of right foot, initial encounter: Secondary | ICD-10-CM | POA: Diagnosis not present

## 2022-03-02 DIAGNOSIS — S92511A Displaced fracture of proximal phalanx of right lesser toe(s), initial encounter for closed fracture: Secondary | ICD-10-CM | POA: Diagnosis not present

## 2022-03-02 DIAGNOSIS — S92309A Fracture of unspecified metatarsal bone(s), unspecified foot, initial encounter for closed fracture: Secondary | ICD-10-CM | POA: Insufficient documentation

## 2022-03-04 ENCOUNTER — Encounter: Payer: Medicare Other | Admitting: Podiatry

## 2022-03-04 ENCOUNTER — Telehealth: Payer: Self-pay | Admitting: *Deleted

## 2022-03-04 NOTE — Telephone Encounter (Signed)
Pharmacy, can you please comment on how long Eliquis can be held for upcoming procedure?  Thank you! 

## 2022-03-04 NOTE — Telephone Encounter (Signed)
? ?  Pre-operative Risk Assessment  ?  ?Patient Name: Keith Vazquez  ?DOB: 03-13-1951 ?MRN: [id number]  ? ?  ? ?Request for Surgical Clearance   ? ?Procedure:   open treatment right lisfranc dislocation ? ?Date of Surgery:  Clearance TBD                              ?   ?Surgeon:   Dr Wylene Simmer  ?Surgeon's Group or Practice Name:  [facility] ?Phone number:  [phone] ?Fax number:  [phone] [place]  ?Type of Clearance Requested:   ?- Pharmacy:  Hold Apixaban (Eliquis)   ?  ?Type of Anesthesia:  General with regional block  ?  ?Additional requests/questions:   n/a ? ?Signed, ?Trixie Dredge V   ?03/04/2022, 11:23 AM   ?

## 2022-03-07 ENCOUNTER — Ambulatory Visit: Payer: Medicare Other

## 2022-03-07 ENCOUNTER — Encounter: Payer: Medicare Other | Admitting: Podiatry

## 2022-03-07 DIAGNOSIS — Z20822 Contact with and (suspected) exposure to covid-19: Secondary | ICD-10-CM | POA: Diagnosis not present

## 2022-03-07 NOTE — Progress Notes (Signed)
NO SHOW. This encounter was created in error - please disregard.

## 2022-03-07 NOTE — Telephone Encounter (Signed)
Patient with diagnosis of aflutter on Eliquis for anticoagulation.   ? ?Procedure: open treatment right lisfranc dislocation ?Date of procedure: TBD ? ? ?CHA2DS2-VASc Score = 4  ? This indicates a 4.8% annual risk of stroke. ?The patient's score is based upon: ?CHF History: 1 ?HTN History: 1 ?Diabetes History: 0 ?Stroke History: 0 ?Vascular Disease History: 1 ?Age Score: 1 ?Gender Score: 0 ?  ?  ? ?CrCl 67 ml/min ? ?Per office protocol, patient can hold Eliquis for 2 days prior to procedure.   ? ? ?

## 2022-03-08 NOTE — Progress Notes (Signed)
This encounter was created in error - please disregard.

## 2022-03-23 ENCOUNTER — Telehealth: Payer: Self-pay

## 2022-03-23 NOTE — Telephone Encounter (Addendum)
   Pre-operative Risk Assessment    Patient Name: Keith Vazquez  DOB: Mar 10, 1951 MRN: [id number]      Request for Surgical Clearance    Procedure:  Open Treatment Right Lisfranc Dislocation  Date of Surgery:  Clearance TBD                                 Surgeon:  Wylene Simmer, MD Surgeon's Group or Practice Name:  [facility] Phone number:  [phone] Fax number:  [phone]   Type of Clearance Requested:   - Medical  - Pharmacy:  Hold Apixaban (Eliquis)     Type of Anesthesia:  General    Additional requests/questions:  Please advise surgeon/provider what medications should be held.  Signed, Monia Pouch   03/23/2022, 10:34 AM

## 2022-03-26 ENCOUNTER — Ambulatory Visit (HOSPITAL_COMMUNITY)
Admission: EM | Admit: 2022-03-26 | Discharge: 2022-03-26 | Discharge status: Against Medical Advice | Payer: Medicare Other

## 2022-04-01 DIAGNOSIS — I2510 Atherosclerotic heart disease of native coronary artery without angina pectoris: Secondary | ICD-10-CM | POA: Diagnosis not present

## 2022-04-01 DIAGNOSIS — I480 Paroxysmal atrial fibrillation: Secondary | ICD-10-CM | POA: Diagnosis not present

## 2022-04-01 DIAGNOSIS — I5022 Chronic systolic (congestive) heart failure: Secondary | ICD-10-CM | POA: Diagnosis not present

## 2022-04-01 DIAGNOSIS — E785 Hyperlipidemia, unspecified: Secondary | ICD-10-CM | POA: Diagnosis not present

## 2022-04-02 LAB — COMPREHENSIVE METABOLIC PANEL
ALT: 15 IU/L (ref 0–44)
AST: 30 IU/L (ref 0–40)
Albumin/Globulin Ratio: 1.6 (ref 1.2–2.2)
Albumin: 4.6 g/dL (ref 3.7–4.7)
Alkaline Phosphatase: 117 IU/L (ref 44–121)
BUN/Creatinine Ratio: 11 (ref 10–24)
BUN: 13 mg/dL (ref 8–27)
Bilirubin Total: 0.4 mg/dL (ref 0.0–1.2)
CO2: 22 mmol/L (ref 20–29)
Calcium: 9.2 mg/dL (ref 8.6–10.2)
Chloride: 102 mmol/L (ref 96–106)
Creatinine, Ser: 1.14 mg/dL (ref 0.76–1.27)
Globulin, Total: 2.8 g/dL (ref 1.5–4.5)
Glucose: 94 mg/dL (ref 70–99)
Potassium: 4 mmol/L (ref 3.5–5.2)
Sodium: 139 mmol/L (ref 134–144)
Total Protein: 7.4 g/dL (ref 6.0–8.5)
eGFR: 68 mL/min/{1.73_m2} (ref >59)

## 2022-04-02 LAB — TSH: TSH: 2.86 u[IU]/mL (ref 0.450–4.500)

## 2022-04-02 LAB — LIPID PANEL
Chol/HDL Ratio: 4.2 ratio (ref 0.0–5.0)
Cholesterol, Total: 183 mg/dL (ref 100–199)
HDL: 44 mg/dL (ref >39)
LDL Chol Calc (NIH): 107 mg/dL — ABNORMAL HIGH (ref 0–99)
Triglycerides: 185 mg/dL — ABNORMAL HIGH (ref 0–149)
VLDL Cholesterol Cal: 32 mg/dL (ref 5–40)

## 2022-04-02 NOTE — Progress Notes (Signed)
Cardiology Office Note    Date:  04/06/2022   ID:  Keith Vazquez, DOB February 08, 1951, MRN [id number]  PCP:  [name], MD  Cardiologist:  [name], MD    History of Present Illness:  Keith Vazquez is a 71 y.o. male seen for evaluation of chest pain. He is post afib ablation 05/05/14.   He has an ischemic CM with chronic systolic CHF.   He is s/p CABG in 2000. Cardiac caths in 2002 and 2015 showed atretic LIMA to the LAD which filled by the SVG to the second diagonal. The SVG to the second diagonal, SVG sequentially to the ramus and OM, and the SVG to PDA were all patent. Last Myoview study in April 2016 was felt to be low risk. He had an ETT in June 2017 with no ischemia at 13 METs. He also has a history of HTN.  In January of 2018 he was started on Entresto- reported intolerance of higher doses. He is on Coreg.  He states he could not tolerate aldactone due to severe depression and quit taking it.  Myoview in 2019 showed inferior scar without ischemia and EF 30%. Echo in May 2021 showed EF down to 25-30%. He was experiencing symptoms of early morning dyspnea and some palpitations. These episodes were rare only occurring 4x/year.  He was seen by Dr. [name]. No mention of a ILR but he did discuss consideration of ICD. Not a candidate for CRT due to narrow QRS. 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   He was having paroxysms of symptomatic atrial fibrillation that occur sporadically.   He has an Apple watch which he uses to track these episodes.  This has detected possible atrial fibrillation.  He  wore an event monitor which confirmed he is having paroxysms of atrial fibrillation.  The burden was 2-3%. He was seen by Dr [name]. His note indicates complete discussions about AAD therapy, anticoagulation, repeat ablation, and ICD. Patient did not want to consider ICD. Dr [name] felt that with low Afib burden the best thing to do was to  monitor for now unless Afib burden increases significantly. He is on Eliquis for anticoagulation.   Previously he noted more frequent episodes of afib about every 10 days. These episodes last 12-16 hours. States he notices these more than he used to. He does report that he took amiodarone in the past prior to ablation and tolerated it well.  He does note some depressive symptoms as well "feeling of gloom and doom". 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 Ultimately we decided to resume amiodarone 400 mg daily and Eliquis. On his last visit his Afib had resolved and we reduced amiodarone to 300 mg dail.  On follow up today he feels very well. Has not had any Afib since his last visit.  Is going to the gym regularly. No chest pain or dyspnea. He did reduce his dose of Coreg to 12.5 mg bid. Was concerned about low BP. No edema.     Past Medical History:  Diagnosis Date   Atrial flutter ([place])    CHF (congestive heart failure) ([place])    LVEF at time of cath 2002 50%; now has been 35-40% since 2008 (patient has seen multiple cardiologists over the years including [facility], [place], [facility], [facility] and Tollie Eth, MD)  Coronary artery disease    CABG x5 in 2000; s/p cath 2002 > 4/5 grafts patent, LVEF 50%   Degeneration of lumbar intervertebral disc    GERD (gastroesophageal reflux disease)    GI bleed    duodenal ulcer   Hypertension    Borderline   Ischemic heart disease    s/p MI, CABG 2000, s/p cath 2002 with 4/5 grafts patent (LIMA to LAD atretic and occluded mid vessel but adequate flow to distal LAD from collaterals / diagonal   Low back pain    PAC (premature atrial contraction)    PAF (paroxysmal atrial fibrillation) ([place])    initially diagnosed during stress test 2002   Peyronie's disease     Past Surgical History:  Procedure Laterality Date   ABLATION  05-06-2014   PVI  and CTI ablation by Dr [name]   ATRIAL FIBRILLATION ABLATION N/A 05/06/2014   Procedure: ATRIAL FIBRILLATION ABLATION;  Surgeon: [name], MD;  Location: [facility] CATH LAB;  Service: Cardiovascular;  Laterality: N/A;   CARDIAC CATHETERIZATION  09/14/2001    Mildly decreased left ventricular systolic function --  Native three vessel coronary artery disease as described. -- Status post coronary artery bypass grafting   CORONARY ARTERY BYPASS GRAFT  2000   LIMA to LAD, SVG to second diagonal, seq SVG to ramus and OM, SVG to PDA   HERNIA REPAIR  05/16/2001   Large left indirect inguinal hernia with right direct hernia.   HERNIA REPAIR     Recurrent left inguinal hernia -- Large left indirect inguinal hernia with right direct hernia.   LEFT HEART CATHETERIZATION WITH CORONARY/GRAFT ANGIOGRAM  11/26/2013   Procedure: LEFT HEART CATHETERIZATION WITH [name];  Surgeon: [name], MD;  Location: [facility] CATH LAB;  Service: Cardiovascular;;   TEE WITHOUT CARDIOVERSION N/A 05/06/2014   Procedure: TRANSESOPHAGEAL ECHOCARDIOGRAM (TEE);  Surgeon: [name], MD;  Location[facility] ENDOSCOPY;  Service: Cardiovascular;  Laterality: N/A;    Current Medications: Outpatient Medications Prior to Visit  Medication Sig Dispense Refill   amiodarone (PACERONE) 200 MG tablet Take 1.5 tablets (300 mg total) by mouth daily. 90 tablet 3   apixaban (ELIQUIS) 5 MG TABS tablet Take 1 tablet (5 mg total) by mouth 2 (two) times daily. 180 tablet 1   Ascorbic Acid (VITAMIN C) 1000 MG tablet Take 1,000 mg by mouth daily.     b complex vitamins tablet Take 1 tablet by mouth daily.     carvedilol (COREG) 12.5 MG tablet Take 1.5 tablets (18.75 mg total) by mouth 2 (two) times daily with a meal. 270 tablet 3   cholecalciferol (VITAMIN D3) 25 MCG (1000 UNIT) tablet Take 1,000 Units by mouth daily.     co-enzyme Q-10 30 MG capsule Take 30 mg by mouth daily.     dicyclomine (BENTYL) 20 MG tablet Take 20 mg by mouth 4  (four) times daily.     gabapentin (NEURONTIN) 100 MG capsule Take 2-3 capsules (200-300 mg total) by mouth at bedtime. 270 capsule 1   Galcanezumab-gnlm (EMGALITY [place]) Inject into the skin.     niacin 500 MG tablet Take 500 mg by mouth at bedtime.     omeprazole (PRILOSEC) 10 MG capsule Take 1 capsule (10 mg total) by mouth daily. 30 capsule 1   Probiotic Product (PROBIOTIC-10 PO) Take 1 capsule by mouth daily.      rosuvastatin (CRESTOR) 5 MG tablet Take 1 tablet (5 mg total) by mouth daily. 90 tablet 1  sacubitril-valsartan (ENTRESTO) 24-26 MG Take 1 tablet by mouth 2 (two) times daily. 180 tablet 3   sertraline (ZOLOFT) 25 MG tablet Take 1 tablet (25 mg total) by mouth daily. 30 tablet 11   SUMAtriptan (IMITREX) 100 MG tablet TAKE 1 TABLET AT ONSET OF MIGRAINE- MAY REPEAT ONCE IN 2 HOURS. LIMIT 2/24 HOURS. AVOID DAILY USE. 30 tablet 3   TURMERIC PO Take 1 tablet by mouth daily.     vitamin E 200 UNIT capsule Take 200 Units by mouth daily.     No facility-administered medications prior to visit.     Allergies:   Amiodarone, Oxycodone-acetaminophen, and Propoxyphene n-acetaminophen   Social History   Socioeconomic History   Marital status: Single    Spouse name: Not on file   Number of children: 0   Years of education: Not on file   Highest education level: Not on file  Occupational History   Occupation: garage owner/corporate pilot  Tobacco Use   Smoking status: Former    Packs/day: 1.00    Years: 20.00    Pack years: 20.00    Types: Cigarettes    Quit date: 10/31/1994    Years since quitting: 27.4   Smokeless tobacco: Never  Vaping Use   Vaping Use: Never used  Substance and Sexual Activity   Alcohol use: No    Alcohol/week: 0.0 standard drinks   Drug use: No   Sexual activity: Yes  Other Topics Concern   Not on file  Social History Narrative   Lives in [place].  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  Also flies airplanes   Social Determinants of Health    Financial Resource Strain: Not on file  Food Insecurity: Not on file  Transportation Needs: Not on file  Physical Activity: Not on file  Stress: Not on file  Social Connections: Not on file     Family History:  The patient's family history is not on file. He was adopted.   ROS:   Please see the history of present illness.    ROS All other systems reviewed and are negative.   PHYSICAL EXAM:   VS:  BP 112/64 (BP Location: Left Arm, Patient Position: Sitting, Cuff Size: Normal)   Pulse 80   Ht 6' 1.5" (1.867 m)   Wt 192 lb (87.1 kg)   BMI 24.99 kg/m    GEN: Well nourished, well developed, in no acute distress  HEENT: normal  Neck: no JVD, carotid bruits, or masses Cardiac: RRR; no murmurs, rubs, or gallops,no edema  Respiratory:  clear to auscultation bilaterally, normal work of breathing GI: soft, nontender, nondistended, + BS MS: no deformity or atrophy  Skin: warm and dry, no rash Neuro:  Alert and Oriented x 3, Strength and sensation are intact Psych: euthymic mood, full affect  Wt Readings from Last 3 Encounters:  04/06/22 192 lb (87.1 kg)  01/20/22 194 lb (88 kg)  01/12/22 195 lb 3.2 oz (88.5 kg)      Studies/Labs Reviewed:   EKG:  EKG is not ordered today.    Recent Labs: 04/07/2021: Hemoglobin 15.4; Platelets 162.0 04/01/2022: ALT 15; BUN 13; Creatinine, Ser 1.14; Potassium 4.0; Sodium 139; TSH 2.860   Lipid Panel    Component Value Date/Time   CHOL 183 04/01/2022 1147   TRIG 185 (H) 04/01/2022 1147   HDL 44 04/01/2022 1147   CHOLHDL 4.2 04/01/2022 1147   CHOLHDL 4 02/08/2021 1134   VLDL 38.0 02/08/2021 1134   LDLCALC 107 (H)  04/01/2022 1147   LDLDIRECT 120.5 09/30/2013 1400    Additional studies/ records that were reviewed today include:  ETT 04/21/16: Study Highlights   There was no ST segment deviation noted during stress.   Ambient PVC;s at rest, with stress and recovery No significant NSVT No ischemia at 13.2 METS and 102% of PMHR    Echo: 04/21/16: Study Conclusions   - Left ventricle: The cavity size was normal. Wall thickness was   normal. Basal to mid inferior akinesis. Basal to mid   inferolateral akinesis. Anterolateral hypokinesis. Systolic   function was mildly to moderately reduced. The estimated ejection   fraction was in the range of 40% to 45%. Doppler parameters are   consistent with abnormal left ventricular relaxation (grade 1   diastolic dysfunction). - Aortic valve: There was no stenosis. - Aorta: Mildly dilated aortic root. Aortic root dimension: 38 mm   (ED). - Mitral valve: Mildly calcified annulus. There was trivial   regurgitation. - Left atrium: The atrium was mildly dilated. - Right ventricle: The cavity size was normal. Systolic function   was mildly reduced. - Tricuspid valve: Peak RV-RA gradient (S): 16 mm Hg. - Pulmonary arteries: PA peak pressure: 19 mm Hg (S). - Inferior vena cava: The vessel was normal in size. The   respirophasic diameter changes were in the normal range (= 50%),   consistent with normal central venous pressure.   Impressions:   - Normal LV size with EF 40-45%. Wall motion abnormalities as noted   above. Normal RV size with mildly decreased systolic function. No   significant valvular abnormalities.  Myoview 11/10/17: Study Highlights    Nuclear stress EF: 30%. The left ventricular ejection fraction is moderately decreased (30-44%). Defect 1: There is a defect present in the basal inferolateral and mid inferolateral location. Findings consistent with prior myocardial infarction. This is a high risk study.   High risk stress nuclear study due to severely reduced global systolic function. There is evidence of an inferolateral scar (previously seen), but there is no reversible ischemia. There is global hypokinesis, worse in the inferolateral wall. LVEF has decreased slightly from 2016 (previously 36%), otherwise little change.   Echo 03/16/20: IMPRESSIONS      1. Global hypokinesis with akinesis of the inferobasal and inferolateral  walls; overall severe LV dysfunction; moderate LVE; mild LVH; grade 1  diastolic dysfunction; mildly dilated aortic root.   2. Left ventricular ejection fraction, by estimation, is 25 to 30%. The  left ventricle has severely decreased function. The left ventricle  demonstrates regional wall motion abnormalities (see scoring  diagram/findings for description). The left  ventricular internal cavity size was moderately dilated. There is mild  left ventricular hypertrophy. Left ventricular diastolic parameters are  consistent with Grade I diastolic dysfunction (impaired relaxation).   3. Right ventricular systolic function is normal. The right ventricular  size is normal.   4. The mitral valve is normal in structure. Trivial mitral valve  regurgitation. No evidence of mitral stenosis.   5. The aortic valve is tricuspid. Aortic valve regurgitation is not  visualized. No aortic stenosis is present.   6. Aortic dilatation noted. There is mild dilatation of the aortic root  measuring 38 mm.   08/13/20: Event monitor: Study Highlights  Normal sinus rhythm Paroxysmal atrial fibrillation with rapid response and some aberrantion NSVT up to 5 beats     ASSESSMENT:    1. PAF (paroxysmal atrial fibrillation) ([place])   2. Chronic systolic heart failure ([place])  3. Coronary artery disease involving native coronary artery of native heart without angina pectoris   4. Essential hypertension   5. Hyperlipidemia LDL goal <70      PLAN:   1.  CAD: s/p CABG 2000. Known atretic LIMA to LAD but other grafts patent by cath in 2015.  -He is on a beta-blocker - no significant angina. Class 1.  - off ASA since on Eliquis  2.  Chronic systolic CHF: EF 78-67% -He is on the carvedilol and Entresto, compliant with both. - intolerant of aldactone 25 mg daily due to depression -  will add Jardiance 10 mg daily.  - refused ICD    3.  Hypertension: -BP is well controlled.    4.  Hyperlipidemia: -He is not on a statin, but is taking coenzyme Q 10 and niacin. -Target LDL is less than 70 - we discussed results of lipid panel. He is not interested in taking lipid lowering therapy.   5.  Paroxysmal Afib - significant improvement of symptoms on amiodarone. Rate controlled on Coreg.  -  - [place] vasc score of 4- On Eliquis.  - will reduce amiodarone to 200 mg daily.  - labs are OK.  - follow up in 6 months.    Ivyana Locey Martinique, MD ,[facility] 04/06/2022 2:40 PM    [facility] [address], [place], [place], [address] [phone]

## 2022-04-06 ENCOUNTER — Encounter: Payer: Self-pay | Admitting: Cardiology

## 2022-04-06 ENCOUNTER — Ambulatory Visit (INDEPENDENT_AMBULATORY_CARE_PROVIDER_SITE_OTHER): Payer: Medicare Other | Admitting: Cardiology

## 2022-04-06 VITALS — BP 112/64 | HR 80 | Ht 73.5 in | Wt 192.0 lb

## 2022-04-06 DIAGNOSIS — I480 Paroxysmal atrial fibrillation: Secondary | ICD-10-CM | POA: Diagnosis not present

## 2022-04-06 DIAGNOSIS — I5022 Chronic systolic (congestive) heart failure: Secondary | ICD-10-CM | POA: Diagnosis not present

## 2022-04-06 DIAGNOSIS — I2510 Atherosclerotic heart disease of native coronary artery without angina pectoris: Secondary | ICD-10-CM | POA: Diagnosis not present

## 2022-04-06 DIAGNOSIS — I10 Essential (primary) hypertension: Secondary | ICD-10-CM | POA: Diagnosis not present

## 2022-04-06 DIAGNOSIS — E785 Hyperlipidemia, unspecified: Secondary | ICD-10-CM | POA: Diagnosis not present

## 2022-04-06 MED ORDER — EMPAGLIFLOZIN 10 MG PO TABS: 10.0000 mg | ORAL_TABLET | Freq: Every day | ORAL | 3 refills | Status: DC

## 2022-04-06 MED ORDER — CARVEDILOL 12.5 MG PO TABS: 12.5000 mg | ORAL_TABLET | Freq: Two times a day (BID) | ORAL | 3 refills | Status: DC

## 2022-04-06 MED ORDER — AMIODARONE HCL 200 MG PO TABS: 200.0000 mg | ORAL_TABLET | Freq: Every day | ORAL | 3 refills | Status: DC

## 2022-04-06 NOTE — Patient Instructions (Addendum)
Medication Instructions:  DECREASE amiodarone to '200mg'$  DECREASE carvedilol to 12.'5mg'$  twice daily START jardiance '10mg'$  daily  - 4 weeks of samples + free 14 day-card + patient assistance application provided  *If you need a refill on your cardiac medications before your next appointment, please call your pharmacy*   Lab Work: CMET, TSH -- prior to next visit   If you have labs (blood work) drawn today and your tests are completely normal, you will receive your results only by: Blue (if you have MyChart) OR A paper copy in the mail If you have any lab test that is abnormal or we need to change your treatment, we will call you to review the results.   Testing/Procedures: NONE   Follow-Up: At [facility], you and your health needs are our priority.  As part of our continuing mission to provide you with exceptional heart care, we have created designated Provider Care Teams.  These Care Teams include your primary Cardiologist (physician) and Advanced Practice Providers (APPs -  Physician Assistants and Nurse Practitioners) who all work together to provide you with the care you need, when you need it.  We recommend signing up for the patient portal called "MyChart".  Sign up information is provided on this After Visit Summary.  MyChart is used to connect with patients for Virtual Visits (Telemedicine).  Patients are able to view lab/test results, encounter notes, upcoming appointments, etc.  Non-urgent messages can be sent to your provider as well.   To learn more about what you can do with MyChart, go to [email or web address].    Your next appointment:   6 month(s)  The format for your next appointment:   In Person  Provider:   Peter Martinique, MD {

## 2022-04-08 ENCOUNTER — Encounter: Payer: Self-pay | Admitting: Podiatry

## 2022-04-08 ENCOUNTER — Ambulatory Visit (INDEPENDENT_AMBULATORY_CARE_PROVIDER_SITE_OTHER): Payer: Medicare Other | Admitting: Podiatry

## 2022-04-08 DIAGNOSIS — M2041 Other hammer toe(s) (acquired), right foot: Secondary | ICD-10-CM | POA: Diagnosis not present
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
## 2022-04-11 ENCOUNTER — Telehealth: Payer: Self-pay | Admitting: Cardiology

## 2022-04-11 NOTE — Telephone Encounter (Signed)
   Pre-operative Risk Assessment    Patient Name: Keith Vazquez  DOB: 1951-07-19 MRN: [id number]      Request for Surgical Clearance    Procedure:   Hammer Toe Arthroplasty w/ Capsulotomy Digits 2, 3 and 4 Right Foot  Date of Surgery:  Clearance 05/12/22                                 Surgeon:  Dr. Daylene Katayama Surgeon's Group or Practice Name:  [facility] Phone number:  [phone] Fax number:  [phone]   Type of Clearance Requested:   - Pharmacy:  [facility] please let office know   Type of Anesthesia:   Choice   Additional requests/questions:  Please advise surgeon/provider what medications should be held.  Signed, Belisicia T Harris   04/11/2022, 10:36 AM

## 2022-04-12 ENCOUNTER — Ambulatory Visit: Payer: Medicare Other | Admitting: Podiatry

## 2022-04-12 NOTE — Telephone Encounter (Signed)
Yes he is cleared to proceed  Saddie Sandeen Martinique MD, [facility]

## 2022-04-12 NOTE — Telephone Encounter (Signed)
Dr. Martinique, patient was just seen by you on 04/06/2022.  Do you agree that patient may proceed to surgery without further cardiac testing?  Please send your response to p cv div preop  Thank you,  Emmaline Life, NP-C    04/12/2022, 9:24 AM [facility] [address], [address] Office [phone] Fax [phone]

## 2022-04-13 NOTE — Telephone Encounter (Signed)
Patient with diagnosis of Afib on Eliquis for anticoagulation.    Procedure: Hammer Toe Arthroplasty w/ Capsulotomy Digits 2, 3 and 4 Right Foot Date of procedure: 05/12/22    CHA2DS2-VASc Score = 4  This indicates a 4.8% annual risk of stroke. The patient's score is based upon: CHF History: 1 HTN History: 1 Diabetes History: 0 Stroke History: 0 Vascular Disease History: 1 Age Score: 1 Gender Score: 0  CrCl 71 ml/min Platelet count 162  Per office protocol, patient can hold Eliquis for 2 days prior to procedure.

## 2022-04-13 NOTE — Telephone Encounter (Signed)
   Primary Cardiologist: Peter Martinique, MD  Chart reviewed as part of pre-operative protocol coverage. Given past medical history and time since last visit, based on ACC/AHA guidelines, Keith Vazquez would be at acceptable risk for the planned procedure without further cardiovascular testing.   He may hold Eliquis for 2 days prior to procedure.   I will route this recommendation to the requesting party via Epic fax function and remove from pre-op pool.  Please call with questions.  Emmaline Life, NP-C    04/13/2022, 4:37 PM [place] [address] Office [phone] Fax [phone]

## 2022-04-21 ENCOUNTER — Telehealth: Payer: Self-pay

## 2022-04-21 NOTE — Telephone Encounter (Signed)
   Pre-operative Risk Assessment    Patient Name: Keith Vazquez  DOB: 1951-08-06 MRN: [id number]      Request for Surgical Clearance    Procedure:   Open Treatment Right Lisfranc Dislocation  Date of Surgery:  Clearance TBD                                 Surgeon:  Wylene Simmer, MD Surgeon's Group or Practice Name:  [facility] Phone number:  [phone] Fax number:  [phone] Kerri Maze   Type of Clearance Requested:   - Medical    Type of Anesthesia:   General Anesthesia with regional block   Additional requests/questions:  Please advise surgeon/provider what medications should be held.  Signed, Elsie Lincoln Jennice Renegar   04/21/2022, 1:51 PM

## 2022-04-21 NOTE — Telephone Encounter (Signed)
Patient with diagnosis of afib on Eliquis for anticoagulation.    Procedure: Open Treatment Right Lisfranc Dislocation Date of procedure: TBD  CHA2DS2-VASc Score = 4  This indicates a 4.8% annual risk of stroke. The patient's score is based upon: CHF History: 1 HTN History: 1 Diabetes History: 0 Stroke History: 0 Vascular Disease History: 1 Age Score: 1 Gender Score: 0   CrCl 22m/min Platelet count 162K  Per office protocol, patient can hold Eliquis for 2 days prior to procedure.

## 2022-04-25 ENCOUNTER — Telehealth: Payer: Self-pay | Admitting: Internal Medicine

## 2022-04-25 NOTE — Telephone Encounter (Signed)
    Patient Name: Keith Vazquez  DOB: 1950/11/12 MRN: [id number]  Primary Cardiologist: Peter Swaziland, MD  Chart reviewed as part of pre-operative protocol coverage. Given past medical history and time since last visit, based on ACC/AHA guidelines, Keith Vazquez would be at acceptable risk for the planned procedure without further cardiovascular testing.   The patient was seen in the office 04/06/2022 by Dr. Swaziland.  He was doing well from a cardiovascular standpoint at that time.  No chest pain or dyspnea.  Dr. Swaziland has granted cardiac medical clearance.  Our pharmacy team has reviewed his anticoagulation.  He may hold his Eliquis 2 days prior to the procedure and resume when deemed medically safe.  I will route this recommendation to the requesting party via Epic fax function and remove from pre-op pool.  Please call with questions.  Sharlene Dory, PA-C 04/25/2022, 9:46 AM

## 2022-05-11 ENCOUNTER — Encounter: Payer: Self-pay | Admitting: Podiatry

## 2022-05-12 NOTE — Telephone Encounter (Signed)
Just making sure you saw this last message of his? Thanks again [place]!

## 2022-05-20 ENCOUNTER — Encounter: Payer: Medicare Other | Admitting: Podiatry

## 2022-05-24 NOTE — Progress Notes (Signed)
Called the [facility] with Foye Deer. to request preop orders needed for surgery on this patient.     05/24/22 1518  Preop Orders  Has preop orders? No  Name of staff/physician contacted for orders(Indicate phone or IB message) Bess Harvest.

## 2022-05-27 ENCOUNTER — Encounter: Payer: Medicare Other | Admitting: Podiatry

## 2022-05-27 NOTE — Patient Instructions (Signed)
DUE TO COVID-19 ONLY TWO VISITORS  (aged 73 and older)  ARE ALLOWED TO COME WITH YOU AND STAY IN THE WAITING ROOM ONLY DURING PRE OP AND PROCEDURE.   **NO VISITORS ARE ALLOWED IN THE SHORT STAY AREA OR RECOVERY ROOM!!**  IF YOU WILL BE ADMITTED INTO THE HOSPITAL YOU ARE ALLOWED ONLY FOUR SUPPORT PEOPLE DURING VISITATION HOURS ONLY (7 AM -8PM)   The support person(s) must pass our screening, gel in and out, and wear a mask at all times, including in the patient's room. Patients must also wear a mask when staff or their support person are in the room. Visitors GUEST BADGE MUST BE WORN VISIBLY  One adult visitor may remain with you overnight and MUST be in the room by 8 P.M.     Your procedure is scheduled on: 06/15/22   Report to [facility] Main Entrance    Report to admitting at : 11:00 AM   Call this number if you have problems the morning of surgery [phone]   Do not eat food :After Midnight.   After Midnight you may have the following liquids until : 10:00 AM DAY OF SURGERY  Water Black Coffee (sugar ok, NO MILK/CREAM OR CREAMERS)  Tea (sugar ok, NO MILK/CREAM OR CREAMERS) regular and decaf                             Plain Jell-O (NO RED)                                           Fruit ices (not with fruit pulp, NO RED)                                     Popsicles (NO RED)                                                                  Juice: apple, WHITE grape, WHITE cranberry Sports drinks like Gatorade (NO RED   Oral Hygiene is also important to reduce your risk of infection.                                    Remember - BRUSH YOUR TEETH THE MORNING OF SURGERY WITH YOUR REGULAR TOOTHPASTE   Do NOT smoke after Midnight   Take these medicines the morning of surgery with A SIP OF WATER:amiodarone,carvedilol,dicyclomine,omeprazole.Sumatriptan as needed.   DO NOT TAKE ANY ORAL DIABETIC MEDICATIONS DAY OF YOUR SURGERY  Bring CPAP mask and tubing day of  surgery.                              You may not have any metal on your body including hair pins, jewelry, and body piercing             Do not wear lotions, powders, perfumes/cologne, or deodorant  Men may shave face and neck.   Do not bring valuables to the hospital. [place].   Contacts, dentures or bridgework may not be worn into surgery.   Bring small overnight bag day of surgery.   DO NOT [place]. PHARMACY WILL DISPENSE MEDICATIONS LISTED ON YOUR MEDICATION LIST TO YOU DURING YOUR ADMISSION [place]!    Patients discharged on the day of surgery will not be allowed to drive home.  Someone NEEDS to stay with you for the first 24 hours after anesthesia.   Special Instructions: Bring a copy of your healthcare power of attorney and living will documents         the day of surgery if you haven't scanned them before.              Please read over the following fact sheets you were given: IF YOU HAVE QUESTIONS ABOUT YOUR PRE-OP INSTRUCTIONS PLEASE CALL [phone]     [facility] - Preparing for Surgery Before surgery, you can play an important role.  Because skin is not sterile, your skin needs to be as free of germs as possible.  You can reduce the number of germs on your skin by washing with CHG (chlorahexidine gluconate) soap before surgery.  CHG is an antiseptic cleaner which kills germs and bonds with the skin to continue killing germs even after washing. Please DO NOT use if you have an allergy to CHG or antibacterial soaps.  If your skin becomes reddened/irritated stop using the CHG and inform your nurse when you arrive at Short Stay. Do not shave (including legs and underarms) for at least 48 hours prior to the first CHG shower.  You may shave your face/neck. Please follow these instructions carefully:  1.  Shower with CHG Soap the night before surgery and the  morning of  Surgery.  2.  If you choose to wash your hair, wash your hair first as usual with your  normal  shampoo.  3.  After you shampoo, rinse your hair and body thoroughly to remove the  shampoo.                           4.  Use CHG as you would any other liquid soap.  You can apply chg directly  to the skin and wash                       Gently with a scrungie or clean washcloth.  5.  Apply the CHG Soap to your body ONLY FROM THE NECK DOWN.   Do not use on face/ open                           Wound or open sores. Avoid contact with eyes, ears mouth and genitals (private parts).                       Wash face,  Genitals (private parts) with your normal soap.             6.  Wash thoroughly, paying special attention to the area where your surgery  will be performed.  7.  Thoroughly rinse your body with warm water from the neck down.  8.  DO NOT shower/wash with your normal soap after using and rinsing off  the CHG Soap.                9.  Pat yourself dry with a clean towel.            10.  Wear clean pajamas.            11.  Place clean sheets on your bed the night of your first shower and do not  sleep with pets. Day of Surgery : Do not apply any lotions/deodorants the morning of surgery.  Please wear clean clothes to the hospital/surgery center.  FAILURE TO FOLLOW THESE INSTRUCTIONS MAY RESULT IN THE CANCELLATION OF YOUR SURGERY PATIENT SIGNATURE_________________________________  NURSE SIGNATURE__________________________________  ________________________________________________________________________

## 2022-05-31 ENCOUNTER — Other Ambulatory Visit: Payer: Self-pay

## 2022-05-31 ENCOUNTER — Encounter (HOSPITAL_COMMUNITY)
Admission: RE | Admit: 2022-05-31 | Discharge: 2022-05-31 | Disposition: A | Discharge status: Home / Self Care | Payer: Medicare Other | Source: Ambulatory Visit | Attending: Podiatry | Admitting: Podiatry

## 2022-05-31 ENCOUNTER — Encounter (HOSPITAL_COMMUNITY): Payer: Self-pay

## 2022-05-31 VITALS — BP 123/92 | HR 70 | Temp 97.9°F | Ht 73.0 in | Wt 191.0 lb

## 2022-05-31 DIAGNOSIS — I10 Essential (primary) hypertension: Secondary | ICD-10-CM | POA: Insufficient documentation

## 2022-05-31 DIAGNOSIS — Z01812 Encounter for preprocedural laboratory examination: Secondary | ICD-10-CM | POA: Insufficient documentation

## 2022-05-31 LAB — BASIC METABOLIC PANEL
Anion gap: 6 (ref 5–15)
BUN: 16 mg/dL (ref 8–23)
CO2: 25 mmol/L (ref 22–32)
Calcium: 9.2 mg/dL (ref 8.9–10.3)
Chloride: 107 mmol/L (ref 98–111)
Creatinine, Ser: 1.26 mg/dL — ABNORMAL HIGH (ref 0.61–1.24)
GFR, Estimated: >60 mL/min (ref >60)
Glucose, Bld: 94 mg/dL (ref 70–99)
Potassium: 4.1 mmol/L (ref 3.5–5.1)
Sodium: 138 mmol/L (ref 135–145)

## 2022-05-31 LAB — CBC
HCT: 48.2 % (ref 39.0–52.0)
Hemoglobin: 15.5 g/dL (ref 13.0–17.0)
MCH: 30.3 pg (ref 26.0–34.0)
MCHC: 32.2 g/dL (ref 30.0–36.0)
MCV: 94.1 fL (ref 80.0–100.0)
Platelets: 168 10*3/uL (ref 150–400)
RBC: 5.12 MIL/uL (ref 4.22–5.81)
RDW: 14 % (ref 11.5–15.5)
WBC: 8.7 10*3/uL (ref 4.0–10.5)
nRBC: 0 % (ref 0.0–0.2)

## 2022-05-31 NOTE — Progress Notes (Signed)
For Short Stay: [place] appointment date: Date of COVID positive in last 1 days:  Bowel Prep reminder:   For Anesthesia: PCP - Dr. Scarlette Calico Cardiologist - Dr. Peter Martinique. LOV: 04/06/22 Clearance: Nicholes Rough: PAC: 04/25/22: EPIC Chest x-ray -  EKG - 10/05/21 Stress Test -  ECHO - 03/16/20 Cardiac Cath - 2015. Pacemaker/ICD device last checked: Pacemaker orders received: Device Rep notified:  Spinal Cord Stimulator:  Sleep Study -  CPAP -   Fasting Blood Sugar -  Checks Blood Sugar _____ times a day Date and result of last Hgb A1c-  Blood Thinner Instructions: Aspirin Instructions: Last Dose:  Activity level: Can go up a flight of stairs and activities of daily living without stopping and without chest pain and/or shortness of breath   Able to exercise without chest pain and/or shortness of breath   Unable to go up a flight of stairs without chest pain and/or shortness of breath     Anesthesia review: Hx: HTN,Aflutter,CHF,CAD,CABG x 5.  Patient denies shortness of breath, fever, cough and chest pain at PAT appointment   Patient verbalized understanding of instructions that were given to them at the PAT appointment. Patient was also instructed that they will need to review over the PAT instructions again at home before surgery.

## 2022-05-31 NOTE — Progress Notes (Signed)
Pt. Need H & P.

## 2022-06-06 ENCOUNTER — Encounter (HOSPITAL_COMMUNITY): Payer: Self-pay

## 2022-06-06 NOTE — Progress Notes (Signed)
Anesthesia Chart Review:   Case: [id number] Date/Time: 06/15/22 1245   Procedures:      HAMMER TOE CORRECTION TOES 2-4 (Right: Toe)     CAPSULOTOMY TOES 2-4 (Right)   Anesthesia type: Choice   Pre-op diagnosis: HAMMER TOE RIGHT FOOT   [facility] RO[facility]   Surgeons: [name], DPM       DISCUSSION: Pt is 71 years old with hx CAD (s/p CABG x5 2000), ischemic cardiomyopathy (EF 20-25%, pt has declined ICD), HTN, PAF (s/p ablation 2015), CHF  H&P pending***  Pt to hold eliquis 2 days before surgery however pt reports he stopped taking Eliquis   VS: BP (!) 123/92   Pulse 70   Temp 36.6 C (Oral)   Ht '6\' 1"'$  (1.854 m)   Wt 86.6 kg   SpO2 98%   BMI 25.20 kg/m   PROVIDERS: - PCP is [name], MD - Cardiologist is Peter Martinique, MD. Last office visit 04/06/22. Cleared for surgery at acceptable risk by Nicholes Rough, PA on 04/25/22 - Saw EP cardiologist Lars Mage 08/13/20 for eval afib and cardiomyopathy. ICD recommended, pt declined.     LABS: Labs reviewed: Acceptable for surgery. (all labs ordered are listed, but only abnormal results are displayed)  Labs Reviewed  BASIC METABOLIC PANEL - Abnormal; Notable for the following components:      Result Value   Creatinine, Ser 1.26 (*)    All other components within normal limits  CBC    EKG 10/05/21: NSR rate 61. Old inferior infarct. Poor R wave progression. Normal QTc   CV: Echo 03/16/20:  1. Global hypokinesis with akinesis of the inferobasal and inferolateral walls; overall severe LV dysfunction; moderate LVE; mild LVH; grade 1 diastolic dysfunction; mildly dilated aortic root.  2. Left ventricular ejection fraction, by estimation, is 25 to 30%. The left ventricle has severely decreased function. The left ventricle demonstrates regional wall motion abnormalities (see scoring diagram/findings for description). The left ventricular internal cavity size was moderately dilated. There is mild left ventricular  hypertrophy. Left ventricular diastolic parameters are consistent with Grade I diastolic dysfunction (impaired relaxation).  3. Right ventricular systolic function is normal. The right ventricular size is normal.  4. The mitral valve is normal in structure. Trivial mitral valve regurgitation. No evidence of mitral stenosis.  5. The aortic valve is tricuspid. Aortic valve regurgitation is not visualized. No aortic stenosis is present.  6. Aortic dilatation noted. There is mild dilatation of the aortic root  measuring 38 mm.   Nuclear stress test 11/10/17:  Nuclear stress EF: 30%. The left ventricular ejection fraction is moderately decreased (30-44%). Defect 1: There is a defect present in the basal inferolateral and mid inferolateral location. Findings consistent with prior myocardial infarction. This is a high risk study. - High risk stress nuclear study due to severely reduced global systolic function. - There is evidence of an inferolateral scar (previously seen), but there is no reversible ischemia. - There is global hypokinesis, worse in the inferolateral wall. - LVEF has decreased slightly from 2016 (previously 36%), otherwise little change.  Past Medical History:  Diagnosis Date   Atrial flutter ([place])    CHF (congestive heart failure) ([place])    LVEF at time of cath 2002 50%; now has been 35-40% since 2008 (patient has seen multiple cardiologists over the years including [facility], [place], [place] [facility], [facility] and Tollie Eth, MD)   Coronary artery disease    CABG  x5 in 2000; s/p cath 2002 > 4/5 grafts patent, LVEF 50%   Degeneration of lumbar intervertebral disc    GERD (gastroesophageal reflux disease)    GI bleed    duodenal ulcer   Hypertension    Borderline   Ischemic heart disease    s/p MI, CABG 2000, s/p cath 2002 with 4/5 grafts patent (LIMA to LAD atretic and occluded mid vessel but adequate flow to distal LAD from collaterals / diagonal    Low back pain    PAC (premature atrial contraction)    PAF (paroxysmal atrial fibrillation) ([place])    initially diagnosed during stress test 2002   PAF (paroxysmal atrial fibrillation) ([place])    Peyronie's disease     Past Surgical History:  Procedure Laterality Date   ABLATION  05-06-2014   PVI and CTI ablation by Dr [name]   ATRIAL FIBRILLATION ABLATION N/A 05/06/2014   Procedure: ATRIAL FIBRILLATION ABLATION;  Surgeon: [name], MD;  Location: [facility] CATH LAB;  Service: Cardiovascular;  Laterality: N/A;   CARDIAC CATHETERIZATION  09/14/2001    Mildly decreased left ventricular systolic function --  Native three vessel coronary artery disease as described. -- Status post coronary artery bypass grafting   CORONARY ARTERY BYPASS GRAFT  2000   LIMA to LAD, SVG to second diagonal, seq SVG to ramus and OM, SVG to PDA   HERNIA REPAIR  05/16/2001   Large left indirect inguinal hernia with right direct hernia.   HERNIA REPAIR     Recurrent left inguinal hernia -- Large left indirect inguinal hernia with right direct hernia.   LEFT HEART CATHETERIZATION WITH CORONARY/GRAFT ANGIOGRAM  11/26/2013   Procedure: LEFT HEART CATHETERIZATION WITH [name];  Surgeon: Peter M Martinique, MD;  Location: [facility] CATH LAB;  Service: Cardiovascular;;   TEE WITHOUT CARDIOVERSION N/A 05/06/2014   Procedure: TRANSESOPHAGEAL ECHOCARDIOGRAM (TEE);  Surgeon: [name], MD;  Location: [facility] ENDOSCOPY;  Service: Cardiovascular;  Laterality: N/A;    MEDICATIONS:  amiodarone (PACERONE) 200 MG tablet   amitriptyline (ELAVIL) 10 MG tablet   Ascorbic Acid (VITAMIN C) 1000 MG tablet   carvedilol (COREG) 12.5 MG tablet   cholecalciferol (VITAMIN D3) 25 MCG (1000 UNIT) tablet   empagliflozin (JARDIANCE) 10 MG TABS tablet   sacubitril-valsartan (ENTRESTO) 24-26 MG   tamsulosin (FLOMAX) 0.4 MG CAPS capsule   vitamin E 200 UNIT capsule   No current facility-administered medications for this encounter.

## 2022-06-06 NOTE — H&P (View-Only) (Signed)
Anesthesia Chart Review:   Case: [id number] Date/Time: 06/15/22 1245   Procedures:      HAMMER TOE CORRECTION TOES 2-4 (Right: Toe)     CAPSULOTOMY TOES 2-4 (Right)   Anesthesia type: Choice   Pre-op diagnosis: HAMMER TOE RIGHT FOOT   [facility]   Surgeons: [name], DPM       DISCUSSION: Pt is 71 years old with hx CAD (s/p CABG x5 2000), ischemic cardiomyopathy (EF 20-25%, pt has declined ICD), HTN, PAF (s/p ablation 2015), CHF  H&P is on paper chart. Dated 05/16/22  Pt to hold eliquis 2 days before surgery however pt reports he has stopped taking Eliquis altogether  VS: BP (!) 123/92   Pulse 70   Temp 36.6 C (Oral)   Ht '6\' 1"'$  (1.854 m)   Wt 86.6 kg   SpO2 98%   BMI 25.20 kg/m   PROVIDERS: - PCP is [name], MD - Cardiologist is Peter Martinique, MD. Last office visit 04/06/22. Cleared for surgery at acceptable risk by Nicholes Rough, PA on 04/25/22 - Saw EP cardiologist Lars Mage 08/13/20 for eval afib and cardiomyopathy. ICD recommended, pt declined.    LABS: Labs reviewed: Acceptable for surgery. (all labs ordered are listed, but only abnormal results are displayed)  Labs Reviewed  BASIC METABOLIC PANEL - Abnormal; Notable for the following components:      Result Value   Creatinine, Ser 1.26 (*)    All other components within normal limits  CBC    EKG 10/05/21: NSR rate 61. Old inferior infarct. Poor R wave progression. Normal QTc   CV: Echo 03/16/20:  1. Global hypokinesis with akinesis of the inferobasal and inferolateral walls; overall severe LV dysfunction; moderate LVE; mild LVH; grade 1 diastolic dysfunction; mildly dilated aortic root.  2. Left ventricular ejection fraction, by estimation, is 25 to 30%. The left ventricle has severely decreased function. The left ventricle demonstrates regional wall motion abnormalities (see scoring diagram/findings for description). The left ventricular internal cavity size was moderately  dilated. There is mild left ventricular hypertrophy. Left ventricular diastolic parameters are consistent with Grade I diastolic dysfunction (impaired relaxation).  3. Right ventricular systolic function is normal. The right ventricular size is normal.  4. The mitral valve is normal in structure. Trivial mitral valve regurgitation. No evidence of mitral stenosis.  5. The aortic valve is tricuspid. Aortic valve regurgitation is not visualized. No aortic stenosis is present.  6. Aortic dilatation noted. There is mild dilatation of the aortic root  measuring 38 mm.   Nuclear stress test 11/10/17:  Nuclear stress EF: 30%. The left ventricular ejection fraction is moderately decreased (30-44%). Defect 1: There is a defect present in the basal inferolateral and mid inferolateral location. Findings consistent with prior myocardial infarction. This is a high risk study. - High risk stress nuclear study due to severely reduced global systolic function. - There is evidence of an inferolateral scar (previously seen), but there is no reversible ischemia. - There is global hypokinesis, worse in the inferolateral wall. - LVEF has decreased slightly from 2016 (previously 36%), otherwise little change.  Past Medical History:  Diagnosis Date   Atrial flutter ([place])    CHF (congestive heart failure) ([place])    LVEF at time of cath 2002 50%; now has been 35-40% since 2008 (patient has seen multiple cardiologists over the years including [facility], [place], [place] [facility], [facility] and Tollie Eth, MD)   Coronary artery  disease    CABG x5 in 2000; s/p cath 2002 > 4/5 grafts patent, LVEF 50%   Degeneration of lumbar intervertebral disc    GERD (gastroesophageal reflux disease)    GI bleed    duodenal ulcer   Hypertension    Borderline   Ischemic heart disease    s/p MI, CABG 2000, s/p cath 2002 with 4/5 grafts patent (LIMA to LAD atretic and occluded mid vessel but adequate flow  to distal LAD from collaterals / diagonal   Low back pain    PAC (premature atrial contraction)    PAF (paroxysmal atrial fibrillation) ([place])    initially diagnosed during stress test 2002   PAF (paroxysmal atrial fibrillation) ([place])    Peyronie's disease     Past Surgical History:  Procedure Laterality Date   ABLATION  05-06-2014   PVI and CTI ablation by Dr [name]   ATRIAL FIBRILLATION ABLATION N/A 05/06/2014   Procedure: ATRIAL FIBRILLATION ABLATION;  Surgeon: [name], MD;  Location: [facility] CATH LAB;  Service: Cardiovascular;  Laterality: N/A;   CARDIAC CATHETERIZATION  09/14/2001    Mildly decreased left ventricular systolic function --  Native three vessel coronary artery disease as described. -- Status post coronary artery bypass grafting   CORONARY ARTERY BYPASS GRAFT  2000   LIMA to LAD, SVG to second diagonal, seq SVG to ramus and OM, SVG to PDA   HERNIA REPAIR  05/16/2001   Large left indirect inguinal hernia with right direct hernia.   HERNIA REPAIR     Recurrent left inguinal hernia -- Large left indirect inguinal hernia with right direct hernia.   LEFT HEART CATHETERIZATION WITH CORONARY/GRAFT ANGIOGRAM  11/26/2013   Procedure: LEFT HEART CATHETERIZATION WITH [name];  Surgeon: Peter M Martinique, MD;  Location: [facility] CATH LAB;  Service: Cardiovascular;;   TEE WITHOUT CARDIOVERSION N/A 05/06/2014   Procedure: TRANSESOPHAGEAL ECHOCARDIOGRAM (TEE);  Surgeon: [name], MD;  Location: [facility] ENDOSCOPY;  Service: Cardiovascular;  Laterality: N/A;    MEDICATIONS:  amiodarone (PACERONE) 200 MG tablet   amitriptyline (ELAVIL) 10 MG tablet   Ascorbic Acid (VITAMIN C) 1000 MG tablet   carvedilol (COREG) 12.5 MG tablet   cholecalciferol (VITAMIN D3) 25 MCG (1000 UNIT) tablet   empagliflozin (JARDIANCE) 10 MG TABS tablet   sacubitril-valsartan (ENTRESTO) 24-26 MG   tamsulosin (FLOMAX) 0.4 MG CAPS capsule   vitamin E 200 UNIT capsule   No current  facility-administered medications for this encounter.    If no changes, I anticipate pt can proceed with surgery as scheduled.   Willeen Cass, PhD, FNP-BC [facility] Stay [facility]/Anesthesiology Phone: [phone] 06/07/2022 11:44 AM

## 2022-06-07 NOTE — Anesthesia Preprocedure Evaluation (Addendum)
Anesthesia Evaluation  Patient identified by MRN, date of birth, ID band Patient awake    Reviewed: Allergy & Precautions, H&P , NPO status , Patient's Chart, lab work & pertinent test results  Airway Mallampati: II  TM Distance: >3 FB Neck ROM: Full    Dental no notable dental hx. (+) Teeth Intact, Dental Advisory Given   Pulmonary neg pulmonary ROS, former smoker,    Pulmonary exam normal breath sounds clear to auscultation       Cardiovascular hypertension, Pt. on medications and Pt. on home beta blockers + CAD and + CABG  + dysrhythmias Atrial Fibrillation  Rhythm:Regular Rate:Normal     Neuro/Psych  Headaches, negative psych ROS   GI/Hepatic Neg liver ROS, PUD, GERD  ,  Endo/Other  negative endocrine ROS  Renal/GU negative Renal ROS  negative genitourinary   Musculoskeletal  (+) Arthritis , Osteoarthritis,    Abdominal   Peds  Hematology negative hematology ROS (+)   Anesthesia Other Findings   Reproductive/Obstetrics negative OB ROS                            Anesthesia Physical Anesthesia Plan  ASA: 3  Anesthesia Plan: MAC   Post-op Pain Management: Tylenol PO (pre-op)*   Induction: Intravenous  PONV Risk Score and Plan: 2 and Propofol infusion and Ondansetron  Airway Management Planned: Natural Airway and Simple Face Mask  Additional Equipment:   Intra-op Plan:   Post-operative Plan:   Informed Consent: I have reviewed the patients History and Physical, chart, labs and discussed the procedure including the risks, benefits and alternatives for the proposed anesthesia with the patient or authorized representative who has indicated his/her understanding and acceptance.     Dental advisory given  Plan Discussed with: CRNA  Anesthesia Plan Comments: (See APP note by A. Kabbe, FNP ICM, EF 20-25%, pt has declined ICD. )       Anesthesia Quick Evaluation

## 2022-06-09 IMAGING — MR MR HEAD W/O CM
12 series · 48 of 48 positions shown · non-contrast
Comparison: None.

CLINICAL DATA: Chronic non intractable headache. Lump on the back
of the head noted for the last 4 years.

EXAM:
MRI HEAD WITHOUT CONTRAST
TECHNIQUE: Multiplanar, multiecho pulse sequences of the brain and surrounding
structures were obtained without intravenous contrast.

[Series 5: T1 · sagittal · 4.0mm · 0.75mm/px · 1 of 31 slices shown (1 of 2)]
[im 1/31]
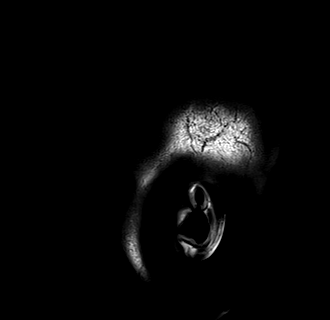

[Series 6: DWI · axial · 3.0mm · 0.94mm/px · z∈[-36,+106]mm · 9 of 168 slices shown (1 of 3)]
[im 1/168]
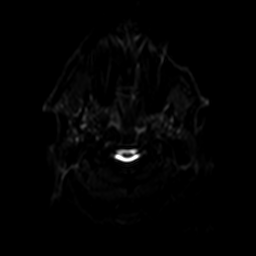
[im 21/168]
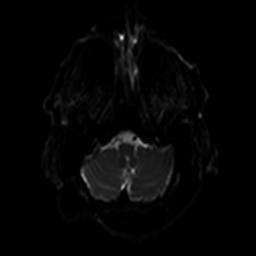
[im 42/168]
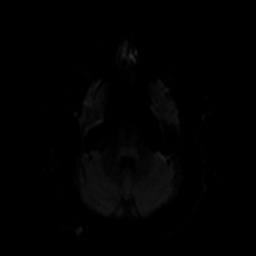
[im 63/168]
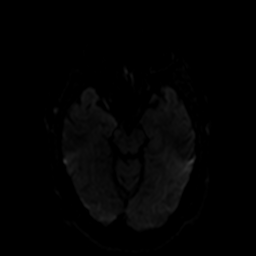
[im 84/168]
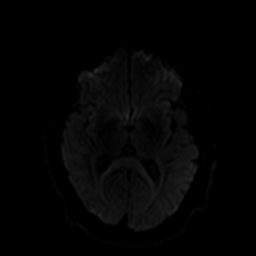
[im 105/168]
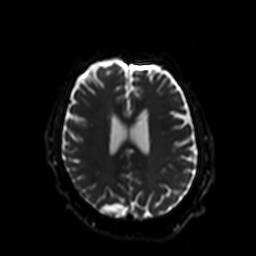
[im 126/168]
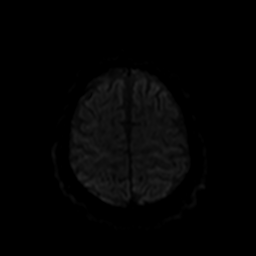
[im 147/168]
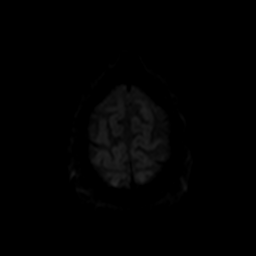
[im 168/168]
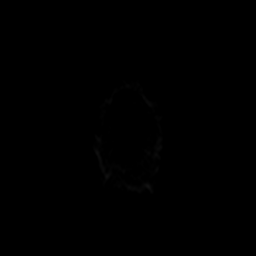

[Series 7: ax dwi_tracew · axial · 3.0mm · 0.94mm/px · z∈[-36,+106]mm · 5 of 84 slices shown]
[im 1/84]
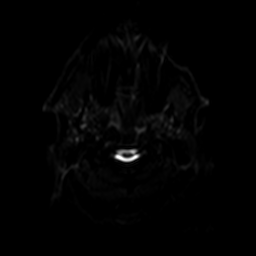
[im 21/84]
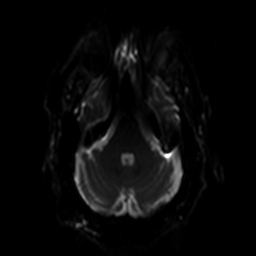
[im 42/84]
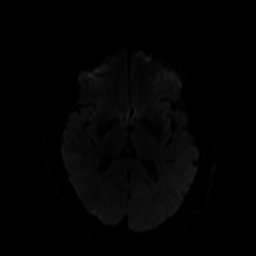
[im 63/84]
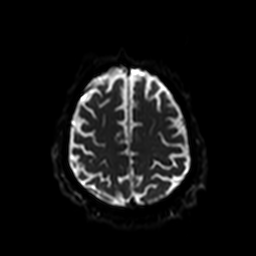
[im 84/84]
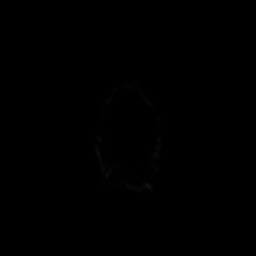

[Series 8: ax dwi_adc · axial · 3.0mm · 0.94mm/px · z∈[-36,+106]mm · 2 of 41 slices shown]
[im 1/41]
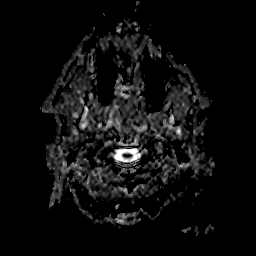
[im 41/41]
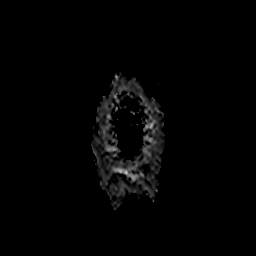

[Series 9: DWI · coronal · 5.0mm · 1.44mm/px · 4 of 66 slices shown (2 of 3)]
[im 1/66]
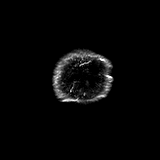
[im 22/66]
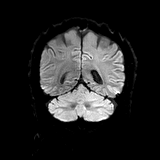
[im 44/66]
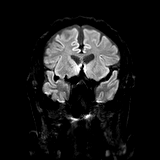
[im 66/66]
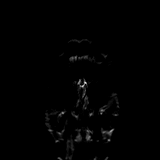

[Series 10: DWI · coronal · 5.0mm · 1.44mm/px · 2 of 33 slices shown (3 of 3)]
[im 1/33]
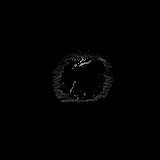
[im 33/33]
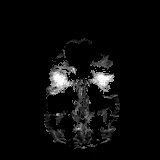

[Series 11: T2 · axial · 3.0mm · 0.36mm/px · z∈[-41,+101]mm · 2 of 42 slices shown (1 of 2)]
[im 1/42]
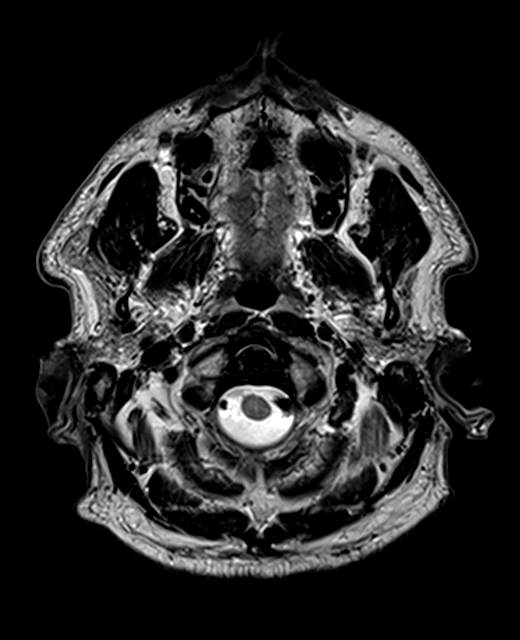
[im 42/42]
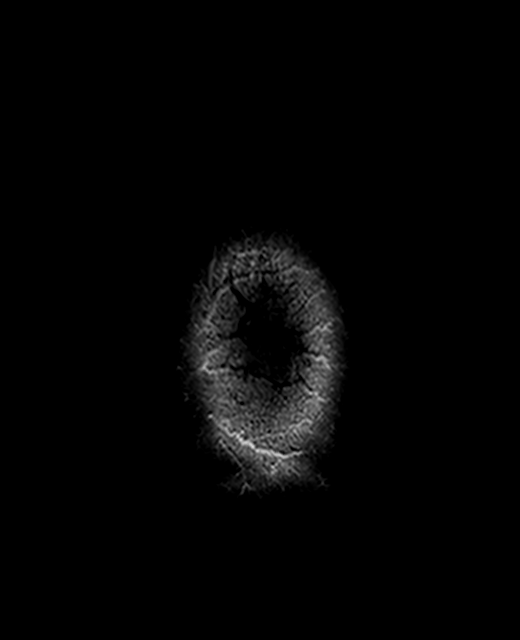

[Series 12: FLAIR · axial · 3.0mm · 0.72mm/px · z∈[-40,+106]mm · 2 of 43 slices shown]
[im 1/43]
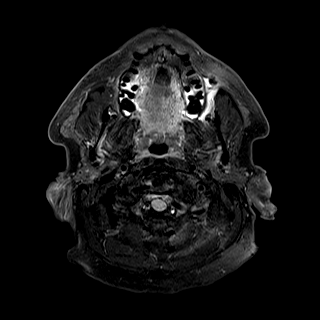
[im 43/43]
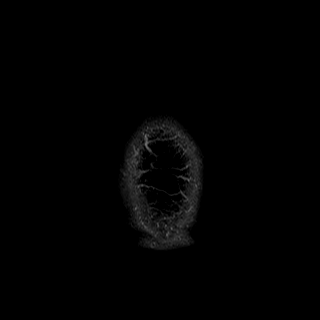

[Series 13: mip_images(sw) · axial · 12.0mm · 0.90mm/px · z∈[-34,+93]mm · 5 of 89 slices shown]
[im 1/89]
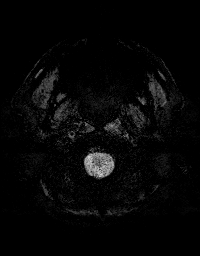
[im 23/89]
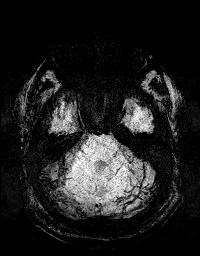
[im 45/89]
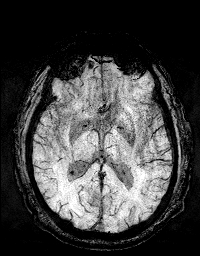
[im 67/89]
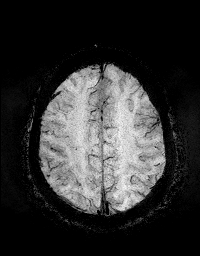
[im 89/89]
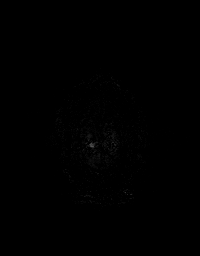

[Series 14: swi_images · axial · 1.5mm · 0.90mm/px · z∈[-39,+98]mm · 5 of 96 slices shown]
[im 1/96]
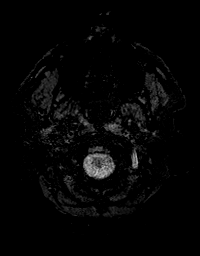
[im 24/96]
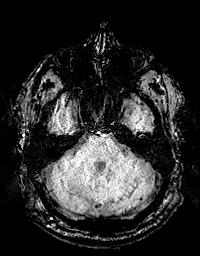
[im 48/96]
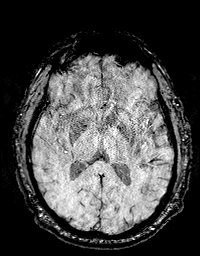
[im 72/96]
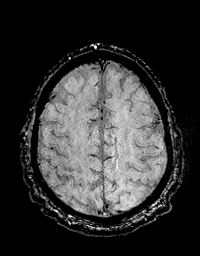
[im 96/96]
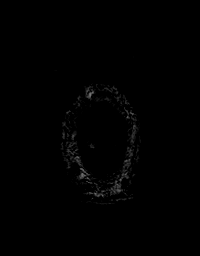

[Series 15: T1 · axial · 1.0mm · 0.94mm/px · z∈[-46,+107]mm · 9 of 160 slices shown (2 of 2)]
[im 1/160]
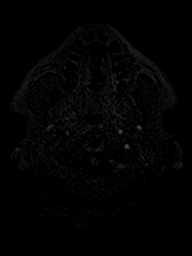
[im 20/160]
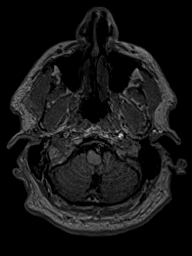
[im 40/160]
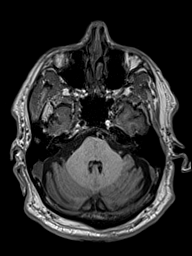
[im 60/160]
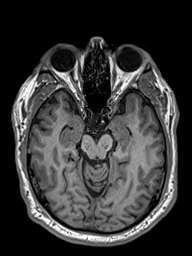
[im 80/160]
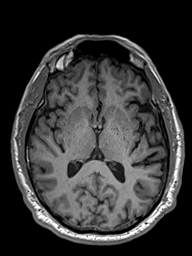
[im 100/160]
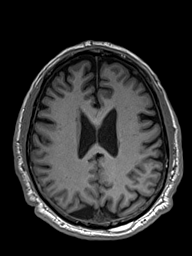
[im 120/160]
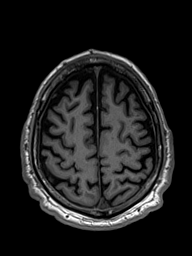
[im 140/160]
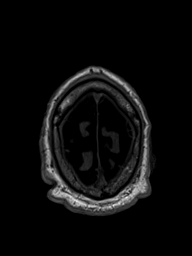
[im 160/160]
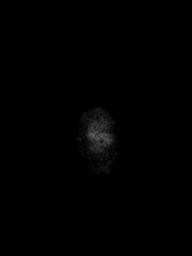

[Series 16: T2 · coronal · 5.0mm · 0.45mm/px · 2 of 33 slices shown (2 of 2)]
[im 1/33]
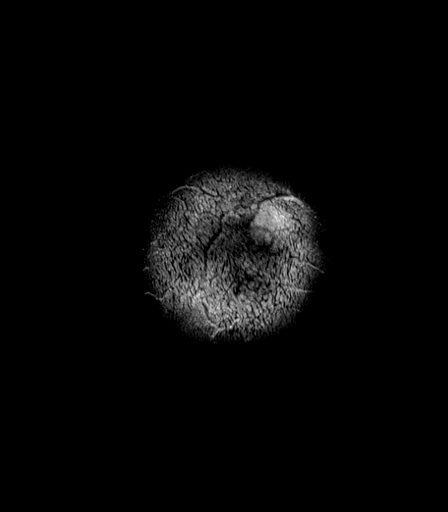
[im 33/33]
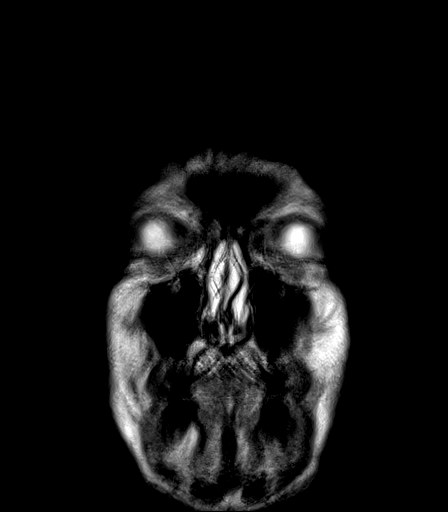

[48 of 48 positions shown; findings below may reference images not displayed]

FINDINGS: Brain: Diffusion imaging does not show any acute or subacute
infarction. No focal abnormality affects the brainstem or
cerebellum. There is vascular impression upon the left side of the
medulla, often seen and he usually not felt significant. Cerebral
hemispheres show minimal small vessel changes of the white matter,
less than often seen at this age. No cortical or large vessel
territory infarction. No mass, hemorrhage, hydrocephalus or
extra-axial collection.

Vascular: Major vessels at the base of the brain show flow.

Skull and upper cervical spine: Negative

Sinuses/Orbits: Clear/normal

Other: Incidental lipoma of the posterior left scalp with a diameter
of 2.3 cm in thickness of only 6 mm.
IMPRESSION: No acute or reversible intracranial finding. Minimal small vessel
change of the cerebral hemispheric white matter, less than often
seen at this age.

Left posterior scalp lipoma, 2.3 cm in diameter with a thickness of
only 6 mm.

## 2022-06-10 ENCOUNTER — Encounter: Payer: Medicare Other | Admitting: Podiatry

## 2022-06-15 ENCOUNTER — Encounter (HOSPITAL_COMMUNITY)
Admission: RE | Disposition: A | Discharge status: Home / Self Care | Payer: Self-pay | Source: Ambulatory Visit | Attending: Podiatry

## 2022-06-15 ENCOUNTER — Other Ambulatory Visit: Payer: Self-pay

## 2022-06-15 ENCOUNTER — Ambulatory Visit (HOSPITAL_BASED_OUTPATIENT_CLINIC_OR_DEPARTMENT_OTHER): Payer: Medicare Other | Admitting: Anesthesiology

## 2022-06-15 ENCOUNTER — Ambulatory Visit (HOSPITAL_COMMUNITY)
Admission: RE | Admit: 2022-06-15 | Discharge: 2022-06-15 | Disposition: A | Discharge status: Home / Self Care | Payer: Medicare Other | Source: Ambulatory Visit | Attending: Podiatry | Admitting: Podiatry

## 2022-06-15 ENCOUNTER — Encounter (HOSPITAL_COMMUNITY): Payer: Self-pay | Admitting: Podiatry

## 2022-06-15 ENCOUNTER — Ambulatory Visit (HOSPITAL_COMMUNITY): Payer: Medicare Other | Admitting: Emergency Medicine

## 2022-06-15 DIAGNOSIS — M2041 Other hammer toe(s) (acquired), right foot: Secondary | ICD-10-CM | POA: Diagnosis not present

## 2022-06-15 DIAGNOSIS — Z87891 Personal history of nicotine dependence: Secondary | ICD-10-CM

## 2022-06-15 DIAGNOSIS — I255 Ischemic cardiomyopathy: Secondary | ICD-10-CM | POA: Diagnosis not present

## 2022-06-15 DIAGNOSIS — I110 Hypertensive heart disease with heart failure: Secondary | ICD-10-CM | POA: Diagnosis not present

## 2022-06-15 DIAGNOSIS — K219 Gastro-esophageal reflux disease without esophagitis: Secondary | ICD-10-CM | POA: Insufficient documentation

## 2022-06-15 DIAGNOSIS — I10 Essential (primary) hypertension: Secondary | ICD-10-CM | POA: Diagnosis not present

## 2022-06-15 DIAGNOSIS — I509 Heart failure, unspecified: Secondary | ICD-10-CM | POA: Insufficient documentation

## 2022-06-15 DIAGNOSIS — I4891 Unspecified atrial fibrillation: Secondary | ICD-10-CM | POA: Insufficient documentation

## 2022-06-15 DIAGNOSIS — Z951 Presence of aortocoronary bypass graft: Secondary | ICD-10-CM | POA: Insufficient documentation

## 2022-06-15 DIAGNOSIS — I2510 Atherosclerotic heart disease of native coronary artery without angina pectoris: Secondary | ICD-10-CM

## 2022-06-15 DIAGNOSIS — I480 Paroxysmal atrial fibrillation: Secondary | ICD-10-CM | POA: Diagnosis not present

## 2022-06-15 HISTORY — PX: CAPSULOTOMY: SHX379

## 2022-06-15 HISTORY — PX: HAMMER TOE SURGERY: SHX385

## 2022-06-15 SURGERY — CORRECTION, HAMMER TOE
Anesthesia: Monitor Anesthesia Care | Site: Toe | Laterality: Right

## 2022-06-15 MED ORDER — LACTATED RINGERS IV SOLN: INTRAVENOUS | Status: DC

## 2022-06-15 MED ORDER — OXYCODONE-ACETAMINOPHEN 5-325 MG PO TABS: 1.0000 | ORAL_TABLET | ORAL | 0 refills | Status: DC | PRN

## 2022-06-15 MED ORDER — PHENYLEPHRINE 80 MCG/ML (10ML) SYRINGE FOR IV PUSH (FOR BLOOD PRESSURE SUPPORT)
PREFILLED_SYRINGE | INTRAVENOUS | Status: AC
  Filled 2022-06-15: qty 10

## 2022-06-15 MED ORDER — PHENYLEPHRINE 80 MCG/ML (10ML) SYRINGE FOR IV PUSH (FOR BLOOD PRESSURE SUPPORT)
PREFILLED_SYRINGE | INTRAVENOUS | Status: DC | PRN
  Administered 2022-06-15 (×3): 160 ug via INTRAVENOUS

## 2022-06-15 MED ORDER — PHENYLEPHRINE HCL (PRESSORS) 10 MG/ML IV SOLN
INTRAVENOUS | Status: AC
  Filled 2022-06-15: qty 1

## 2022-06-15 MED ORDER — BUPIVACAINE HCL (PF) 0.5 % IJ SOLN
INTRAMUSCULAR | Status: AC
  Filled 2022-06-15: qty 30

## 2022-06-15 MED ORDER — CARVEDILOL 12.5 MG PO TABS
12.5000 mg | ORAL_TABLET | Freq: Once | ORAL | Status: AC
Start: 2022-06-15 — End: 2022-06-15
  Administered 2022-06-15: 12.5 mg via ORAL
  Filled 2022-06-15: qty 1

## 2022-06-15 MED ORDER — LIDOCAINE HCL 2 % IJ SOLN
INTRAMUSCULAR | Status: DC | PRN
  Administered 2022-06-15: 15 mL

## 2022-06-15 MED ORDER — FENTANYL CITRATE (PF) 100 MCG/2ML IJ SOLN
INTRAMUSCULAR | Status: DC | PRN
  Administered 2022-06-15: 100 ug via INTRAVENOUS

## 2022-06-15 MED ORDER — PROPOFOL 1000 MG/100ML IV EMUL
INTRAVENOUS | Status: AC
  Filled 2022-06-15: qty 100

## 2022-06-15 MED ORDER — ORAL CARE MOUTH RINSE: 15.0000 mL | Freq: Once | OROMUCOSAL | Status: AC

## 2022-06-15 MED ORDER — CHLORHEXIDINE GLUCONATE 0.12 % MT SOLN
15.0000 mL | Freq: Once | OROMUCOSAL | Status: AC
  Administered 2022-06-15: 15 mL via OROMUCOSAL

## 2022-06-15 MED ORDER — LIDOCAINE 2% (20 MG/ML) 5 ML SYRINGE
INTRAMUSCULAR | Status: DC | PRN
  Administered 2022-06-15: 60 mg via INTRAVENOUS

## 2022-06-15 MED ORDER — ACETAMINOPHEN 500 MG PO TABS
1000.0000 mg | ORAL_TABLET | Freq: Once | ORAL | Status: AC
Start: 2022-06-15 — End: 2022-06-15
  Administered 2022-06-15: 1000 mg via ORAL
  Filled 2022-06-15: qty 2

## 2022-06-15 MED ORDER — BUPIVACAINE HCL (PF) 0.5 % IJ SOLN
INTRAMUSCULAR | Status: DC | PRN
  Administered 2022-06-15: 15 mL

## 2022-06-15 MED ORDER — LIDOCAINE HCL 2 % IJ SOLN
INTRAMUSCULAR | Status: AC
  Filled 2022-06-15: qty 20

## 2022-06-15 MED ORDER — PROPOFOL 500 MG/50ML IV EMUL
INTRAVENOUS | Status: DC | PRN
  Administered 2022-06-15: 50 ug/kg/min via INTRAVENOUS

## 2022-06-15 MED ORDER — CEFAZOLIN SODIUM-DEXTROSE 2-4 GM/100ML-% IV SOLN
2.0000 g | INTRAVENOUS | Status: AC
  Administered 2022-06-15: 2 g via INTRAVENOUS
  Filled 2022-06-15: qty 100

## 2022-06-15 MED ORDER — PROPOFOL 10 MG/ML IV BOLUS
INTRAVENOUS | Status: DC | PRN
  Administered 2022-06-15: 20 mg via INTRAVENOUS
  Administered 2022-06-15: 10 mg via INTRAVENOUS
  Administered 2022-06-15 (×3): 20 mg via INTRAVENOUS

## 2022-06-15 MED ORDER — FENTANYL CITRATE (PF) 100 MCG/2ML IJ SOLN
INTRAMUSCULAR | Status: AC
  Filled 2022-06-15: qty 2

## 2022-06-15 MED ORDER — MIDAZOLAM HCL 5 MG/5ML IJ SOLN
INTRAMUSCULAR | Status: DC | PRN
  Administered 2022-06-15: 2 mg via INTRAVENOUS

## 2022-06-15 MED ORDER — FENTANYL CITRATE PF 50 MCG/ML IJ SOSY: 25.0000 ug | PREFILLED_SYRINGE | INTRAMUSCULAR | Status: DC | PRN

## 2022-06-15 MED ORDER — 0.9 % SODIUM CHLORIDE (POUR BTL) OPTIME
TOPICAL | Status: DC | PRN
  Administered 2022-06-15: 1000 mL

## 2022-06-15 MED ORDER — MIDAZOLAM HCL 2 MG/2ML IJ SOLN
INTRAMUSCULAR | Status: AC
  Filled 2022-06-15: qty 2

## 2022-06-15 SURGICAL SUPPLY — 71 items
APL PRP STRL LF DISP 70% ISPRP (MISCELLANEOUS) ×2
BAG COUNTER SPONGE SURGICOUNT (BAG) IMPLANT
BAG SPEC THK2 15X12 ZIP CLS (MISCELLANEOUS) ×2
BAG SPNG CNTER NS LX DISP (BAG)
BAG ZIPLOCK 12X15 (MISCELLANEOUS) ×4 IMPLANT
BLADE MINI RND TIP GREEN BEAV (BLADE) ×1 IMPLANT
BLADE OSC/SAG .038X5.5 CUT EDG (BLADE) ×1 IMPLANT
BLADE SURG 15 STRL LF DISP TIS (BLADE) ×6 IMPLANT
BLADE SURG 15 STRL SS (BLADE) ×6
BLADE SURG MINI STRL (BLADE) ×4 IMPLANT
BLADE SURG SZ10 CARB STEEL (BLADE) ×8 IMPLANT
BNDG CMPR 9X4 STRL LF SNTH (GAUZE/BANDAGES/DRESSINGS) ×2
BNDG ELASTIC 4X5.8 VLCR STR LF (GAUZE/BANDAGES/DRESSINGS) ×4 IMPLANT
BNDG ELASTIC 6X5.8 VLCR STR LF (GAUZE/BANDAGES/DRESSINGS) ×4 IMPLANT
BNDG ESMARK 4X9 LF (GAUZE/BANDAGES/DRESSINGS) ×4 IMPLANT
BNDG GAUZE DERMACEA FLUFF (GAUZE/BANDAGES/DRESSINGS) ×1
BNDG GAUZE DERMACEA FLUFF 4 (GAUZE/BANDAGES/DRESSINGS) IMPLANT
BNDG GZE 12X3 1 PLY HI ABS (GAUZE/BANDAGES/DRESSINGS)
BNDG GZE DERMACEA 4 6PLY (GAUZE/BANDAGES/DRESSINGS) ×2
BNDG STRETCH GAUZE 3IN X12FT (GAUZE/BANDAGES/DRESSINGS) ×3 IMPLANT
CHLORAPREP W/TINT 26 (MISCELLANEOUS) ×1 IMPLANT
COVER SURGICAL LIGHT HANDLE (MISCELLANEOUS) ×4 IMPLANT
CUFF TOURN SGL QUICK 18X4 (TOURNIQUET CUFF) ×4 IMPLANT
CUFF TOURN SGL QUICK 24 (TOURNIQUET CUFF)
CUFF TRNQT CYL 24X4X16.5-23 (TOURNIQUET CUFF) IMPLANT
DRAPE OEC MINIVIEW 54X84 (DRAPES) ×4 IMPLANT
DRAPE SURG 17X11 SM STRL (DRAPES) ×4 IMPLANT
DURAPREP 26ML APPLICATOR (WOUND CARE) ×3 IMPLANT
ELECT REM PT RETURN 15FT ADLT (MISCELLANEOUS) ×4 IMPLANT
GAUZE SPONGE 4X4 12PLY STRL (GAUZE/BANDAGES/DRESSINGS) ×7 IMPLANT
GAUZE XEROFORM 1X8 LF (GAUZE/BANDAGES/DRESSINGS) ×5 IMPLANT
GLOVE BIO SURGEON STRL SZ7.5 (GLOVE) ×4 IMPLANT
GLOVE BIO SURGEON STRL SZ8 (GLOVE) ×4 IMPLANT
GLOVE INDICATOR 8.0 STRL GRN (GLOVE) ×4 IMPLANT
GOWN STRL REUS W/ TWL LRG LVL3 (GOWN DISPOSABLE) ×6 IMPLANT
GOWN STRL REUS W/ TWL XL LVL3 (GOWN DISPOSABLE) ×3 IMPLANT
GOWN STRL REUS W/TWL LRG LVL3 (GOWN DISPOSABLE) ×6
GOWN STRL REUS W/TWL XL LVL3 (GOWN DISPOSABLE) ×3
K-WIRE CAPS STERILE WHITE .045 (WIRE) ×1 IMPLANT
K-WIRE DBL TROCAR .045X4 (WIRE) ×6
K-WIRE DBL TROCAR .062X4 (WIRE)
KIT BASIN OR (CUSTOM PROCEDURE TRAY) ×4 IMPLANT
KIT TURNOVER KIT A (KITS) ×4 IMPLANT
KWIRE DBL TROCAR .045X4 (WIRE) ×6 IMPLANT
KWIRE DBL TROCAR .062X4 (WIRE) ×6 IMPLANT
MANIFOLD NEPTUNE II (INSTRUMENTS) ×3 IMPLANT
NDL FILTER BLUNT 18X1 1/2 (NEEDLE) ×3 IMPLANT
NEEDLE FILTER BLUNT 18X 1/2SAF (NEEDLE) ×1
NEEDLE FILTER BLUNT 18X1 1/2 (NEEDLE) ×2 IMPLANT
NS IRRIG 1000ML POUR BTL (IV SOLUTION) ×4 IMPLANT
PACK ORTHO EXTREMITY (CUSTOM PROCEDURE TRAY) ×4 IMPLANT
PAD CAST 4YDX4 CTTN HI CHSV (CAST SUPPLIES) ×3 IMPLANT
PAD PREP 24X48 CUFFED NSTRL (MISCELLANEOUS) ×3 IMPLANT
PADDING CAST COTTON 4X4 STRL (CAST SUPPLIES)
PENCIL SMOKE EVACUATOR (MISCELLANEOUS) IMPLANT
PROTECTOR NERVE ULNAR (MISCELLANEOUS) ×4 IMPLANT
SOL PREP POV-IOD 4OZ 10% (MISCELLANEOUS) ×4 IMPLANT
SOL PREP PVP 2OZ (MISCELLANEOUS) ×3
SOL SCRUB PVP POV-IOD 4OZ 7.5% (MISCELLANEOUS) ×3
SOLUTION PREP PVP 2OZ (MISCELLANEOUS) ×3 IMPLANT
SOLUTION SCRB POV-IOD 4OZ 7.5% (MISCELLANEOUS) ×3 IMPLANT
STOCKINETTE 4X48 STRL (DRAPES) ×4 IMPLANT
STRIP CLOSURE SKIN 1/4X4 (GAUZE/BANDAGES/DRESSINGS) ×4 IMPLANT
SUT ETHILON 5-0 FS-2 18 BLK (SUTURE) ×4 IMPLANT
SUT PROLENE 3 0 PS 2 (SUTURE) ×4 IMPLANT
SUT PROLENE 4 0 PS 2 18 (SUTURE) ×2 IMPLANT
SUT VIC AB 4-0 FS2 27 (SUTURE) ×5 IMPLANT
SYR 10ML LL (SYRINGE) ×4 IMPLANT
TOWEL OR 17X26 10 PK STRL BLUE (TOWEL DISPOSABLE) ×4 IMPLANT
WATER STERILE IRR 1000ML POUR (IV SOLUTION) ×4 IMPLANT
WATER STERILE IRR 500ML POUR (IV SOLUTION) ×3 IMPLANT

## 2022-06-15 NOTE — Interval H&P Note (Signed)
History and Physical Interval Note:  06/15/2022 12:10 PM  Keith Vazquez  has presented today for surgery, with the diagnosis of HAMMER TOE RIGHT FOOT.  The various methods of treatment have been discussed with the patient and family. After consideration of risks, benefits and other options for treatment, the patient has consented to  Procedure(s): HAMMER TOE CORRECTION TOES 2-4 (Right) CAPSULOTOMY TOES 2-4 (Right) as a surgical intervention.  The patient's history has been reviewed, patient examined, no change in status, stable for surgery.  I have reviewed the patient's chart and labs.  Questions were answered to the patient's satisfaction.     [name]

## 2022-06-15 NOTE — Anesthesia Postprocedure Evaluation (Signed)
Anesthesia Post Note  Patient: Keith Vazquez  Procedure(s) Performed: HAMMER TOE CORRECTION TOES 2-4 (Right: Toe) CAPSULOTOMY TOES 2-4 (Right)     Patient location during evaluation: PACU Anesthesia Type: MAC Level of consciousness: awake and alert Pain management: pain level controlled Vital Signs Assessment: post-procedure vital signs reviewed and stable Respiratory status: spontaneous breathing, nonlabored ventilation and respiratory function stable Cardiovascular status: stable and blood pressure returned to baseline Postop Assessment: no apparent nausea or vomiting Anesthetic complications: no   No notable events documented.  Last Vitals:  Vitals:   06/15/22 1545 06/15/22 1600  BP: 128/88 132/86  Pulse: 62 (!) 58  Resp: 16 10  Temp:  (!) 36.4 C  SpO2: 98% 98%    Last Pain:  Vitals:   06/15/22 1600  TempSrc:   PainSc: 0-No pain                 Colonel Krauser,W. EDMOND

## 2022-06-15 NOTE — Anesthesia Procedure Notes (Signed)
Date/Time: 06/15/2022 1:20 PM  Performed by: Sharlette Dense, CRNAOxygen Delivery Method: Simple face mask

## 2022-06-15 NOTE — Brief Op Note (Signed)
06/15/2022  3:42 PM  PATIENT:  Keith Vazquez  71 y.o. male  PRE-OPERATIVE DIAGNOSIS:  HAMMER TOE RIGHT FOOT  POST-OPERATIVE DIAGNOSIS:  HAMMER TOE RIGHT FOOT  PROCEDURE:  Procedure(s): HAMMER TOE CORRECTION TOES 2-4 (Right) CAPSULOTOMY TOES 2-4 (Right)  SURGEON:  Surgeon(s) and Role:    [name], DPM - Primary  PHYSICIAN ASSISTANT:   ASSISTANTS: none   ANESTHESIA:   local and IV sedation  EBL:  minimal   BLOOD ADMINISTERED:none  DRAINS: none   LOCAL MEDICATIONS USED:  MARCAINE   , LIDOCAINE , and Amount: 20 ml  SPECIMEN:  No Specimen  DISPOSITION OF SPECIMEN:  N/A  COUNTS:  YES  TOURNIQUET:   Total Tourniquet Time Documented: Calf (Right) - 99 minutes Total: Calf (Right) - 99 minutes   DICTATION: .Viviann Spare Dictation  PLAN OF CARE: Discharge to home after PACU  PATIENT DISPOSITION:  PACU - hemodynamically stable.   Delay start of Pharmacological VTE agent (>24hrs) due to surgical blood loss or risk of bleeding: not applicable

## 2022-06-15 NOTE — Op Note (Signed)
OPERATIVE REPORT Patient name: Keith Vazquez MRN: [id number] DOB: 1951-02-23  DOS: 06/15/22  Preop Dx: Hammertoes digits 2, 3, 4 right Postop Dx: same  Procedure:  1.  Hammertoe arthroplasty with MTP capsulotomy second right 2.  Hammertoe arthroplasty with MTP capsulotomy third right 3.  Hammertoe arthroplasty with MTP capsulotomy fourth right  Surgeon: [name] DPM  Anesthesia: IV sedation with 50-50 mixture of 2% lidocaine plain with 0.5% Marcaine plain totaling 20 mL infiltrated in the patient's right forefoot  Hemostasis: Calf tourniquet inflated to a pressure of 217mHg after esmarch exsanguination   EBL: Minimal mL Materials: None Injectables: None Pathology: None  Condition: The patient tolerated the procedure and anesthesia well. No complications noted or reported   Justification for procedure: The patient is a 71y.o. man who presents today for surgical correction of symptomatic hammertoes to the second third and fourth digits of the right foot. All conservative modalities of been unsuccessful in providing any sort of satisfactory alleviation of symptoms with the patient. The patient was told benefits as well as possible side effects of the surgery. The patient consented for surgical correction. The patient consent form was reviewed. All patient questions were answered. No guarantees were expressed or implied. The patient and the surgeon both signed the patient consent form placed in the patient's chart.   Procedure in Detail: The patient was brought to the operating room, placed in the operating table in the supine position at which time an aseptic scrub and drape were performed about the patient's respective lower extremity after anesthesia was induced as described above. Attention was then directed to the surgical area where procedure number one commenced.  Procedure #1: Hammertoe arthroplasty with MTP capsulotomy second right Transverse elliptical incision  was planned and made overlying the PIPJ of the second digit right foot.  Ellipsed skin wedge was removed and transverse tenotomy of the EDL tendon was performed to expose the underlying joint.  A #64 blade mounted on a Beaver handle was utilized to free all capsular and ligamentous tissue around the joint and expose the hypertrophic head of the proximal phalanx.  The hypertrophic head of the proximal phalanx as well as the base of the middle phalanx was resected away using a sagittal saw mounted on sagittal blade.  Irrigation utilized.    Attention was then directed to the more proximal portion of the second digit overlying the MTP joint where a 2 cm linear longitudinal skin incision was planned and made down to the level of EDL tendon.  Z-plasty tenotomy was performed and dorsal joint capsulotomy was also performed using a #15 scalpel.  A McGlamry elevator inserted within the joint to release the plantar apparatus.  The toe was then in a more corrected position.  Irrigation was utilized and a 0.045 inch percutaneous K wire was driven through the respective ray of the second digit to allow for rectus alignment and healing postoperatively.  Verified by intraoperative x-ray fluoroscopy which was satisfactory.    The opposing ends of the tenotomy's were reapproximated under normal physiologic tension using 4-0 Vicryl suture followed by 4-0 Vicryl suture to reapproximate subcutaneous tissue and 4-0 Prolene suture to reapproximate superficial skin edges.  The percutaneous portion of the K wire was bent dorsal at a 90 degree angle with a synthetic ball cap placed over the percutaneous portion  Procedure #2: Hammertoe arthroplasty with MTP capsulotomy third right A 4 cm linear longitudinal skin incision was planned and made overlying the PIPJ and MTP  joint of the third digit of the right foot.  Incision was carried down to the level of EDL tendon.  At this time a Z-plasty tenotomy was performed of the EDL tendon to  expose the underlying PIPJ and MTP joint of the respective ray.  Attention was then directed to the PIPJ where a #64 blade mounted a Beaver handle was utilized to free all capsular and ligamentous tissue around the joint and expose the hypertrophic head of the proximal phalanx.  The hypertrophic head of the proximal phalanx as well as the base of the middle phalanx was resected away using a sagittal saw mounted on sagittal blade.  Irrigation utilized.    Attention was then directed more proximal to the overlying the MTP joint where a dorsal joint capsulotomy was also performed using a #15 scalpel.  A McGlamry elevator inserted within the joint to release the plantar apparatus.  The toe was then in a more corrected position.  Irrigation was utilized and a 0.045 inch percutaneous K wire was driven through the respective ray of the second digit to allow for rectus alignment and healing postoperatively.  Verified by intraoperative x-ray fluoroscopy which was satisfactory.    The opposing ends of the tenotomy were reapproximated under normal physiologic tension using 4-0 Vicryl suture followed by 4-0 Vicryl suture to reapproximate subcutaneous tissue and 4-0 Prolene suture to reapproximate superficial skin edges.  The percutaneous portion of the K wire was bent dorsal at a 90 degree angle with a synthetic ball cap placed over the percutaneous portionThe opposing ends of the tenotomy's were reapproximated under normal physiologic tension using 4-0 Vicryl suture followed by 4-0 Vicryl suture to reapproximate subcutaneous tissue and 4-0 Prolene suture to reapproximate superficial skin edges.  The percutaneous portion of the K wire was bent dorsal at a 90 degree angle with a synthetic ball cap placed over the percutaneous portion  Procedure #3: Hammertoe arthroplasty with MTP capsulotomy fourth digit right foot Procedure #3 commenced in the exact same fashion banners procedure #2 with the exception of procedure #3 was  performed to the fourth ray of the right foot.  Closure achieved in the same manner.  Dry sterile compressive dressings were then applied to all previously mentioned incision sites about the patient's lower extremity. The tourniquet which was used for hemostasis was deflated. All normal neurovascular responses including pink color and warmth returned all the digits of patient's lower extremity.  The patient was then transferred from the operating room to the recovery room having tolerated the procedure and anesthesia well. All vital signs are stable. After a brief stay in the recovery room the patient was discharged with adequate prescriptions for analgesia. Verbal as well as written instructions were provided for the patient regarding wound care. The patient is to keep the dressings clean dry and intact until they are to follow surgeon Dr. Daylene Katayama in the office upon discharge.   [name], DPM [facility]  Dr. [name], DPM    [address], [place] [address]                Office [phone]  Fax [phone]

## 2022-06-15 NOTE — Progress Notes (Signed)
Orthopedic Tech Progress Note Patient Details:  Keith Vazquez Jun 21, 1951 [id number]  Patient ID: Keith Vazquez, male   DOB: [birth year], 71 y.o.   MRN: [id number]  Keith Vazquez 06/15/2022, 3:43 PM Cam walker applied to right leg in pacu

## 2022-06-15 NOTE — Transfer of Care (Signed)
Immediate Anesthesia Transfer of Care Note  Patient: Keith Vazquez  Procedure(s) Performed: HAMMER TOE CORRECTION TOES 2-4 (Right: Toe) CAPSULOTOMY TOES 2-4 (Right)  Patient Location: PACU  Anesthesia Type:MAC  Level of Consciousness: awake, alert  and oriented  Airway & Oxygen Therapy: Patient Spontanous Breathing and Patient connected to face mask oxygen  Post-op Assessment: Report given to RN and Post -op Vital signs reviewed and stable  Post vital signs: Reviewed and stable  Last Vitals:  Vitals Value Taken Time  BP    Temp    Pulse 51 06/15/22 1533  Resp 11 06/15/22 1533  SpO2 86 % 06/15/22 1533  Vitals shown include unvalidated device data.  Last Pain:  Vitals:   06/15/22 1145  TempSrc:   PainSc: 0-No pain      Patients Stated Pain Goal: 3 (15/18/34 3735)  Complications: No notable events documented.

## 2022-06-16 ENCOUNTER — Encounter (HOSPITAL_COMMUNITY): Payer: Self-pay | Admitting: Podiatry

## 2022-06-18 ENCOUNTER — Encounter: Payer: Self-pay | Admitting: Podiatry

## 2022-06-22 ENCOUNTER — Ambulatory Visit (INDEPENDENT_AMBULATORY_CARE_PROVIDER_SITE_OTHER): Payer: Medicare Other | Admitting: Podiatry

## 2022-06-22 DIAGNOSIS — Z9889 Other specified postprocedural states: Secondary | ICD-10-CM

## 2022-06-22 MED ORDER — DOXYCYCLINE HYCLATE 100 MG PO TABS: 100.0000 mg | ORAL_TABLET | Freq: Two times a day (BID) | ORAL | 0 refills | Status: DC

## 2022-06-22 NOTE — Progress Notes (Signed)
Chief Complaint  Patient presents with   Post-op Follow-up    POV #1 DOS 06/15/2022 HAMMERTOE ARTHROPLASTY W/CAPSULOTOMY DIGITS 2-4 RT FOOT    Subjective:  Patient presents today status post hammertoe repair digits 2, 3, 4 right foot. DOS: 06/15/2022.  Patient states that he has no pain.  He has been ambulating in the cam boot.  He states that yesterday he actually went to the gym to exercise.  He presents for further treatment and evaluation  Past Medical History:  Diagnosis Date   Atrial flutter ([place])    CHF (congestive heart failure) (HCC)    LVEF at time of cath 2002 50%; now has been 35-40% since 2008 (patient has seen multiple cardiologists over the years including [facility], [place], [facility], [facility] and Tollie Eth, MD)   Coronary artery disease    CABG x5 in 2000; s/p cath 2002 > 4/5 grafts patent, LVEF 50%   Degeneration of lumbar intervertebral disc    GERD (gastroesophageal reflux disease)    GI bleed    duodenal ulcer   Hypertension    Borderline   Ischemic heart disease    s/p MI, CABG 2000, s/p cath 2002 with 4/5 grafts patent (LIMA to LAD atretic and occluded mid vessel but adequate flow to distal LAD from collaterals / diagonal   Low back pain    PAC (premature atrial contraction)    PAF (paroxysmal atrial fibrillation) ([place])    initially diagnosed during stress test 2002   PAF (paroxysmal atrial fibrillation) ([place])    Peyronie's disease     Past Surgical History:  Procedure Laterality Date   ABLATION  05-06-2014   PVI and CTI ablation by Dr [name]   ATRIAL FIBRILLATION ABLATION N/A 05/06/2014   Procedure: ATRIAL FIBRILLATION ABLATION;  Surgeon: [name], MD;  Location: [place] CATH LAB;  Service: Cardiovascular;  Laterality: N/A;   CAPSULOTOMY Right 06/15/2022   Procedure: CAPSULOTOMY TOES 2-4;  Surgeon: [name], DPM;  Location: [facility] ORS;  Service: Podiatry;  Laterality: Right;   CARDIAC CATHETERIZATION  09/14/2001     Mildly decreased left ventricular systolic function --  Native three vessel coronary artery disease as described. -- Status post coronary artery bypass grafting   CORONARY ARTERY BYPASS GRAFT  2000   LIMA to LAD, SVG to second diagonal, seq SVG to ramus and OM, SVG to PDA   HAMMER TOE SURGERY Right 06/15/2022   Procedure: HAMMER TOE CORRECTION TOES 2-4;  Surgeon: [name], DPM;  Location: [facility] ORS;  Service: Podiatry;  Laterality: Right;   HERNIA REPAIR  05/16/2001   Large left indirect inguinal hernia with right direct hernia.   HERNIA REPAIR     Recurrent left inguinal hernia -- Large left indirect inguinal hernia with right direct hernia.   LEFT HEART CATHETERIZATION WITH CORONARY/GRAFT ANGIOGRAM  11/26/2013   Procedure: LEFT HEART CATHETERIZATION WITH [name];  Surgeon: Peter M Martinique, MD;  Location: [facility] CATH LAB;  Service: Cardiovascular;;   TEE WITHOUT CARDIOVERSION N/A 05/06/2014   Procedure: TRANSESOPHAGEAL ECHOCARDIOGRAM (TEE);  Surgeon: [name], MD;  Location: [facility] ENDOSCOPY;  Service: Cardiovascular;  Laterality: N/A;    No Known Allergies  Objective/Physical Exam Neurovascular status intact.  Skin incisions appear to be well coapted with sutures  intact. No sign of infectious process noted. No dehiscence. No active bleeding noted. Moderate edema noted to the surgical extremity.  There is also some ecchymosis along the dorsum of the foot with superficial  dermatitis/rash  Radiographic Exam:  Orthopedic hardware and osteotomies sites appear to be stable with routine healing.  Assessment: 1. s/p hammertoe repair 2, 3, 4 RT. DOS: 06/15/2022   Plan of Care:  1. Patient was evaluated. X-rays reviewed 2.  Due to the redness/rash along the dorsum of the foot I did call in a prescription for doxycycline 100 mg 2 times daily #20 3.  Dressings changed.  Clean dry and intact x1 week 4.  Stressed the importance of reducing activity and resting the foot with  elevation 5.  Continue minimal weightbearing in the cam boot 6.  Return to clinic 1 week  [name], DPM [facility]  Dr. [name], DPM    [address], [place] [address]                Office [phone]  Fax [phone]

## 2022-06-29 ENCOUNTER — Encounter: Payer: Self-pay | Admitting: Podiatry

## 2022-06-29 ENCOUNTER — Ambulatory Visit (INDEPENDENT_AMBULATORY_CARE_PROVIDER_SITE_OTHER): Payer: Medicare Other | Admitting: Podiatry

## 2022-06-29 DIAGNOSIS — Z9889 Other specified postprocedural states: Secondary | ICD-10-CM

## 2022-06-29 NOTE — Progress Notes (Signed)
Chief Complaint  Patient presents with   Follow-up    2 DOS 06/15/2022 HAMMERTOE ARTHROPLASTY W/CAPSULOTOMY DIGITS 2-4 RT FOOT,patient is happy with his results, less pain.    Subjective:  Patient presents today status post hammertoe repair digits 2, 3, 4 right foot. DOS: 06/15/2022.  Patient continues to do well and has no pain.   Past Medical History:  Diagnosis Date   Atrial flutter ([place])    CHF (congestive heart failure) (HCC)    LVEF at time of cath 2002 50%; now has been 35-40% since 2008 (patient has seen multiple cardiologists over the years including [facility], [place], [facility], [facility] and Tollie Eth, MD)   Coronary artery disease    CABG x5 in 2000; s/p cath 2002 > 4/5 grafts patent, LVEF 50%   Degeneration of lumbar intervertebral disc    GERD (gastroesophageal reflux disease)    GI bleed    duodenal ulcer   Hypertension    Borderline   Ischemic heart disease    s/p MI, CABG 2000, s/p cath 2002 with 4/5 grafts patent (LIMA to LAD atretic and occluded mid vessel but adequate flow to distal LAD from collaterals / diagonal   Low back pain    PAC (premature atrial contraction)    PAF (paroxysmal atrial fibrillation) ([place])    initially diagnosed during stress test 2002   PAF (paroxysmal atrial fibrillation) ([place])    Peyronie's disease     Past Surgical History:  Procedure Laterality Date   ABLATION  05-06-2014   PVI and CTI ablation by Dr [name]   ATRIAL FIBRILLATION ABLATION N/A 05/06/2014   Procedure: ATRIAL FIBRILLATION ABLATION;  Surgeon: [name], MD;  Location: [place] CATH LAB;  Service: Cardiovascular;  Laterality: N/A;   CAPSULOTOMY Right 06/15/2022   Procedure: CAPSULOTOMY TOES 2-4;  Surgeon: [name], DPM;  Location: [facility];  Service: Podiatry;  Laterality: Right;   CARDIAC CATHETERIZATION  09/14/2001    Mildly decreased left ventricular systolic function --  Native three vessel coronary artery disease as described.  -- Status post coronary artery bypass grafting   CORONARY ARTERY BYPASS GRAFT  2000   LIMA to LAD, SVG to second diagonal, seq SVG to ramus and OM, SVG to PDA   HAMMER TOE SURGERY Right 06/15/2022   Procedure: HAMMER TOE CORRECTION TOES 2-4;  Surgeon: [name], DPM;  Location: [facility];  Service: Podiatry;  Laterality: Right;   HERNIA REPAIR  05/16/2001   Large left indirect inguinal hernia with right direct hernia.   HERNIA REPAIR     Recurrent left inguinal hernia -- Large left indirect inguinal hernia with right direct hernia.   LEFT HEART CATHETERIZATION WITH CORONARY/GRAFT ANGIOGRAM  11/26/2013   Procedure: LEFT HEART CATHETERIZATION WITH [name];  Surgeon: Peter M Martinique, MD;  Location: [facility] CATH LAB;  Service: Cardiovascular;;   TEE WITHOUT CARDIOVERSION N/A 05/06/2014   Procedure: TRANSESOPHAGEAL ECHOCARDIOGRAM (TEE);  Surgeon: [name], MD;  Location: [facility] ENDOSCOPY;  Service: Cardiovascular;  Laterality: N/A;    No Known Allergies  Objective/Physical Exam Neurovascular status intact.  Skin incisions appear to be well coapted with sutures  intact. No sign of infectious process noted. No dehiscence. No active bleeding noted. Negative for any significant edema noted to the surgical extremity. Ecchymosis resolved.   Assessment: 1. s/p hammertoe repair 2, 3, 4 RT. DOS: 06/15/2022   Plan of Care:  1. Patient was evaluated. X-rays reviewed 2.  Sutures removed.  3.  Postop shoe dispensed. Weight bearing as tolerated. 4. RTC 3 weeks   [name], DPM [facility]  Dr. [name], DPM    [address], [place] [address]                Office [phone]  Fax [phone]

## 2022-07-13 ENCOUNTER — Encounter: Payer: Medicare Other | Admitting: Podiatry

## 2022-07-20 ENCOUNTER — Ambulatory Visit (INDEPENDENT_AMBULATORY_CARE_PROVIDER_SITE_OTHER): Payer: Medicare Other | Admitting: Podiatry

## 2022-07-20 DIAGNOSIS — Z9889 Other specified postprocedural states: Secondary | ICD-10-CM

## 2022-07-20 NOTE — Progress Notes (Signed)
Chief Complaint  Patient presents with   Post-op Follow-up    Post op visit DOS 06/15/22 right foot.patient states that he I in no pain with his foot and is confident that he will walk out wearing his regular tennis shoe today.    Subjective:  Patient presents today status post hammertoe repair digits 2, 3, 4 right foot. DOS: 06/15/2022.  Patient is doing well.  He continues to be weightbearing in the postsurgical shoe as instructed.  No new complaints at this time.  Patient continues to do well and has no pain.   Past Medical History:  Diagnosis Date   Atrial flutter ([place])    CHF (congestive heart failure) (HCC)    LVEF at time of cath 2002 50%; now has been 35-40% since 2008 (patient has seen multiple cardiologists over the years including [facility], [facility], [facility], [facility] and Tollie Eth, MD)   Coronary artery disease    CABG x5 in 2000; s/p cath 2002 > 4/5 grafts patent, LVEF 50%   Degeneration of lumbar intervertebral disc    GERD (gastroesophageal reflux disease)    GI bleed    duodenal ulcer   Hypertension    Borderline   Ischemic heart disease    s/p MI, CABG 2000, s/p cath 2002 with 4/5 grafts patent (LIMA to LAD atretic and occluded mid vessel but adequate flow to distal LAD from collaterals / diagonal   Low back pain    PAC (premature atrial contraction)    PAF (paroxysmal atrial fibrillation) ([place])    initially diagnosed during stress test 2002   PAF (paroxysmal atrial fibrillation) ([place])    Peyronie's disease     Past Surgical History:  Procedure Laterality Date   ABLATION  05-06-2014   PVI and CTI ablation by Dr [name]   ATRIAL FIBRILLATION ABLATION N/A 05/06/2014   Procedure: ATRIAL FIBRILLATION ABLATION;  Surgeon: [name], MD;  Location: [facility] CATH LAB;  Service: Cardiovascular;  Laterality: N/A;   CAPSULOTOMY Right 06/15/2022   Procedure: CAPSULOTOMY TOES 2-4;  Surgeon: [name], DPM;  Location: [facility];  Service:  Podiatry;  Laterality: Right;   CARDIAC CATHETERIZATION  09/14/2001    Mildly decreased left ventricular systolic function --  Native three vessel coronary artery disease as described. -- Status post coronary artery bypass grafting   CORONARY ARTERY BYPASS GRAFT  2000   LIMA to LAD, SVG to second diagonal, seq SVG to ramus and OM, SVG to PDA   HAMMER TOE SURGERY Right 06/15/2022   Procedure: HAMMER TOE CORRECTION TOES 2-4;  Surgeon: [name], DPM;  Location: [facility];  Service: Podiatry;  Laterality: Right;   HERNIA REPAIR  05/16/2001   Large left indirect inguinal hernia with right direct hernia.   HERNIA REPAIR     Recurrent left inguinal hernia -- Large left indirect inguinal hernia with right direct hernia.   LEFT HEART CATHETERIZATION WITH CORONARY/GRAFT ANGIOGRAM  11/26/2013   Procedure: LEFT HEART CATHETERIZATION WITH [name];  Surgeon: Peter M Martinique, MD;  Location: [facility] CATH LAB;  Service: Cardiovascular;;   TEE WITHOUT CARDIOVERSION N/A 05/06/2014   Procedure: TRANSESOPHAGEAL ECHOCARDIOGRAM (TEE);  Surgeon: [name], MD;  Location: [facility] ENDOSCOPY;  Service: Cardiovascular;  Laterality: N/A;    No Known Allergies  Objective/Physical Exam Neurovascular status intact.  Skin incisions healed.  The toes are in a nice rectus alignment.  Percutaneous pins intact.  Clinically no indication of infection.  No edema or erythema  Assessment: 1. s/p hammertoe repair 2, 3, 4 RT. DOS: 06/15/2022   Plan of Care:  1. Patient was evaluated.  2.  Percutaneous fixation pins removed today.  Light dressing applied 3.  Patient may begin to transition out of the postsurgical shoe into good supportive sneakers 4.  Return to clinic 4 weeks for follow-up x-ray  [name], DPM [facility]  Dr. [name], DPM    [address], [place] [address]                Office [phone]  Fax [phone]

## 2022-07-28 ENCOUNTER — Other Ambulatory Visit: Payer: Self-pay

## 2022-07-28 ENCOUNTER — Encounter: Payer: Self-pay | Admitting: Family Medicine

## 2022-07-28 DIAGNOSIS — M48061 Spinal stenosis, lumbar region without neurogenic claudication: Secondary | ICD-10-CM

## 2022-07-28 DIAGNOSIS — M79671 Pain in right foot: Secondary | ICD-10-CM

## 2022-08-02 ENCOUNTER — Encounter: Payer: Self-pay | Admitting: Physical Therapy

## 2022-08-02 ENCOUNTER — Ambulatory Visit (INDEPENDENT_AMBULATORY_CARE_PROVIDER_SITE_OTHER): Payer: Medicare Other | Admitting: Physical Therapy

## 2022-08-02 DIAGNOSIS — M25561 Pain in right knee: Secondary | ICD-10-CM

## 2022-08-02 DIAGNOSIS — M25571 Pain in right ankle and joints of right foot: Secondary | ICD-10-CM | POA: Diagnosis not present

## 2022-08-02 DIAGNOSIS — M5459 Other low back pain: Secondary | ICD-10-CM

## 2022-08-02 DIAGNOSIS — G8929 Other chronic pain: Secondary | ICD-10-CM
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
## 2022-08-09 ENCOUNTER — Ambulatory Visit (INDEPENDENT_AMBULATORY_CARE_PROVIDER_SITE_OTHER): Payer: Medicare Other | Admitting: Physical Therapy

## 2022-08-09 ENCOUNTER — Encounter: Payer: Self-pay | Admitting: Physical Therapy

## 2022-08-09 DIAGNOSIS — M25571 Pain in right ankle and joints of right foot: Secondary | ICD-10-CM

## 2022-08-09 DIAGNOSIS — M5459 Other low back pain: Secondary | ICD-10-CM | POA: Diagnosis not present

## 2022-08-09 DIAGNOSIS — M25561 Pain in right knee: Secondary | ICD-10-CM | POA: Diagnosis not present

## 2022-08-09 DIAGNOSIS — G8929 Other chronic pain: Secondary | ICD-10-CM | POA: Diagnosis not present
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
## 2022-08-10 ENCOUNTER — Encounter: Payer: Medicare Other | Admitting: Physical Therapy

## 2022-08-12 ENCOUNTER — Encounter: Payer: Self-pay | Admitting: Physical Therapy

## 2022-08-12 ENCOUNTER — Ambulatory Visit (INDEPENDENT_AMBULATORY_CARE_PROVIDER_SITE_OTHER): Payer: Medicare Other | Admitting: Physical Therapy

## 2022-08-12 DIAGNOSIS — M5459 Other low back pain: Secondary | ICD-10-CM

## 2022-08-12 DIAGNOSIS — G8929 Other chronic pain: Secondary | ICD-10-CM | POA: Diagnosis not present

## 2022-08-12 DIAGNOSIS — M25571 Pain in right ankle and joints of right foot: Secondary | ICD-10-CM | POA: Diagnosis not present

## 2022-08-12 DIAGNOSIS — M25561 Pain in right knee: Secondary | ICD-10-CM | POA: Diagnosis not present
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
## 2022-08-16 ENCOUNTER — Encounter: Payer: Self-pay | Admitting: Physical Therapy

## 2022-08-16 ENCOUNTER — Ambulatory Visit (INDEPENDENT_AMBULATORY_CARE_PROVIDER_SITE_OTHER): Payer: Medicare Other | Admitting: Physical Therapy

## 2022-08-16 DIAGNOSIS — M25561 Pain in right knee: Secondary | ICD-10-CM

## 2022-08-16 DIAGNOSIS — M25571 Pain in right ankle and joints of right foot: Secondary | ICD-10-CM

## 2022-08-16 DIAGNOSIS — G8929 Other chronic pain: Secondary | ICD-10-CM

## 2022-08-16 DIAGNOSIS — M5459 Other low back pain: Secondary | ICD-10-CM
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
## 2022-08-18 ENCOUNTER — Ambulatory Visit (INDEPENDENT_AMBULATORY_CARE_PROVIDER_SITE_OTHER): Payer: Medicare Other | Admitting: Physical Therapy

## 2022-08-18 ENCOUNTER — Encounter: Payer: Self-pay | Admitting: Physical Therapy

## 2022-08-18 DIAGNOSIS — M25561 Pain in right knee: Secondary | ICD-10-CM | POA: Diagnosis not present

## 2022-08-18 DIAGNOSIS — G8929 Other chronic pain: Secondary | ICD-10-CM

## 2022-08-18 DIAGNOSIS — M5459 Other low back pain: Secondary | ICD-10-CM

## 2022-08-18 DIAGNOSIS — M25571 Pain in right ankle and joints of right foot: Secondary | ICD-10-CM | POA: Diagnosis not present
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
## 2022-08-22 ENCOUNTER — Ambulatory Visit (INDEPENDENT_AMBULATORY_CARE_PROVIDER_SITE_OTHER): Payer: Medicare Other | Admitting: Podiatry

## 2022-08-22 ENCOUNTER — Ambulatory Visit (INDEPENDENT_AMBULATORY_CARE_PROVIDER_SITE_OTHER): Payer: Medicare Other

## 2022-08-22 DIAGNOSIS — Z9889 Other specified postprocedural states: Secondary | ICD-10-CM

## 2022-08-22 DIAGNOSIS — M2041 Other hammer toe(s) (acquired), right foot: Secondary | ICD-10-CM | POA: Diagnosis not present

## 2022-08-22 NOTE — Progress Notes (Signed)
Chief Complaint  Patient presents with   Post-op Follow-up    Patient is here for post op follow-up right foot hammertoe.DOS 06/15/22.the patient states that he has been to physical therapy.    Subjective:  Patient presents today status post hammertoe repair digits 2, 3, 4 right foot. DOS: 06/15/2022.  Patient states that he is doing very well.  He is now in good supportive tennis shoes and sneakers with his old orthotics.  He is requesting a new pair of orthotics today.  He says the physical therapy has also helped tremendously.  Past Medical History:  Diagnosis Date   Atrial flutter ([place])    CHF (congestive heart failure) (HCC)    LVEF at time of cath 2002 50%; now has been 35-40% since 2008 (patient has seen multiple cardiologists over the years including [facility], [place], [facility], [facility] and Tollie Eth, MD)   Coronary artery disease    CABG x5 in 2000; s/p cath 2002 > 4/5 grafts patent, LVEF 50%   Degeneration of lumbar intervertebral disc    GERD (gastroesophageal reflux disease)    GI bleed    duodenal ulcer   Hypertension    Borderline   Ischemic heart disease    s/p MI, CABG 2000, s/p cath 2002 with 4/5 grafts patent (LIMA to LAD atretic and occluded mid vessel but adequate flow to distal LAD from collaterals / diagonal   Low back pain    PAC (premature atrial contraction)    PAF (paroxysmal atrial fibrillation) ([place])    initially diagnosed during stress test 2002   PAF (paroxysmal atrial fibrillation) ([place])    Peyronie's disease     Past Surgical History:  Procedure Laterality Date   ABLATION  05-06-2014   PVI and CTI ablation by Dr [name]   ATRIAL FIBRILLATION ABLATION N/A 05/06/2014   Procedure: ATRIAL FIBRILLATION ABLATION;  Surgeon: [name], MD;  Location: [facility] CATH LAB;  Service: Cardiovascular;  Laterality: N/A;   CAPSULOTOMY Right 06/15/2022   Procedure: CAPSULOTOMY TOES 2-4;  Surgeon: [name], DPM;  Location: [facility]  ORS;  Service: Podiatry;  Laterality: Right;   CARDIAC CATHETERIZATION  09/14/2001    Mildly decreased left ventricular systolic function --  Native three vessel coronary artery disease as described. -- Status post coronary artery bypass grafting   CORONARY ARTERY BYPASS GRAFT  2000   LIMA to LAD, SVG to second diagonal, seq SVG to ramus and OM, SVG to PDA   HAMMER TOE SURGERY Right 06/15/2022   Procedure: HAMMER TOE CORRECTION TOES 2-4;  Surgeon: [name], DPM;  Location: [facility] ORS;  Service: Podiatry;  Laterality: Right;   HERNIA REPAIR  05/16/2001   Large left indirect inguinal hernia with right direct hernia.   HERNIA REPAIR     Recurrent left inguinal hernia -- Large left indirect inguinal hernia with right direct hernia.   LEFT HEART CATHETERIZATION WITH CORONARY/GRAFT ANGIOGRAM  11/26/2013   Procedure: LEFT HEART CATHETERIZATION WITH [name];  Surgeon: Peter M Martinique, MD;  Location: [facility] CATH LAB;  Service: Cardiovascular;;   TEE WITHOUT CARDIOVERSION N/A 05/06/2014   Procedure: TRANSESOPHAGEAL ECHOCARDIOGRAM (TEE);  Surgeon: [name], MD;  Location: [facility] ENDOSCOPY;  Service: Cardiovascular;  Laterality: N/A;    No Known Allergies  Objective/Physical Exam Neurovascular status intact.  Skin incisions healed.  The toes are in a nice rectus alignment.  No pain with palpation throughout the toes.  No edema or erythema noted  Assessment: 1.  s/p hammertoe repair 2, 3, 4 RT. DOS: 06/15/2022   Plan of Care:  1. Patient was evaluated.  2.  Patient may now resume full activity no restrictions.  Recommend good supportive tennis shoes and sneakers 3.  Today the patient was molded for new custom molded orthotics.  He has an old pair of orthotics.  Continue in the meantime 4.  Return to clinic PUO  [name], DPM [facility]  Dr. [name], DPM    [address], [place] [address]                 Office [phone]  Fax [phone]

## 2022-08-23 ENCOUNTER — Encounter: Payer: Medicare Other | Admitting: Physical Therapy

## 2022-08-25 ENCOUNTER — Encounter: Payer: Self-pay | Admitting: Physical Therapy

## 2022-08-25 ENCOUNTER — Ambulatory Visit (INDEPENDENT_AMBULATORY_CARE_PROVIDER_SITE_OTHER): Payer: Medicare Other | Admitting: Physical Therapy

## 2022-08-25 DIAGNOSIS — M25561 Pain in right knee: Secondary | ICD-10-CM | POA: Diagnosis not present

## 2022-08-25 DIAGNOSIS — G8929 Other chronic pain: Secondary | ICD-10-CM | POA: Diagnosis not present

## 2022-08-25 DIAGNOSIS — M5459 Other low back pain: Secondary | ICD-10-CM

## 2022-08-25 DIAGNOSIS — M25571 Pain in right ankle and joints of right foot: Secondary | ICD-10-CM
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
## 2022-08-30 ENCOUNTER — Encounter: Payer: Self-pay | Admitting: Physical Therapy

## 2022-08-30 ENCOUNTER — Ambulatory Visit (INDEPENDENT_AMBULATORY_CARE_PROVIDER_SITE_OTHER): Payer: Medicare Other | Admitting: Physical Therapy

## 2022-08-30 DIAGNOSIS — G8929 Other chronic pain: Secondary | ICD-10-CM

## 2022-08-30 DIAGNOSIS — M25571 Pain in right ankle and joints of right foot: Secondary | ICD-10-CM | POA: Diagnosis not present

## 2022-08-30 DIAGNOSIS — M25561 Pain in right knee: Secondary | ICD-10-CM | POA: Diagnosis not present

## 2022-08-30 DIAGNOSIS — M5459 Other low back pain: Secondary | ICD-10-CM | POA: Diagnosis not present
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
## 2022-09-01 ENCOUNTER — Ambulatory Visit (INDEPENDENT_AMBULATORY_CARE_PROVIDER_SITE_OTHER): Payer: Medicare Other | Admitting: Physical Therapy

## 2022-09-01 ENCOUNTER — Encounter: Payer: Self-pay | Admitting: Physical Therapy

## 2022-09-01 DIAGNOSIS — M25561 Pain in right knee: Secondary | ICD-10-CM

## 2022-09-01 DIAGNOSIS — M5459 Other low back pain: Secondary | ICD-10-CM | POA: Diagnosis not present

## 2022-09-01 DIAGNOSIS — M25571 Pain in right ankle and joints of right foot: Secondary | ICD-10-CM | POA: Diagnosis not present

## 2022-09-01 DIAGNOSIS — G8929 Other chronic pain: Secondary | ICD-10-CM
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
## 2022-09-07 ENCOUNTER — Encounter: Payer: Self-pay | Admitting: Family Medicine

## 2022-09-09 ENCOUNTER — Telehealth: Payer: Self-pay | Admitting: *Deleted

## 2022-09-09 NOTE — Telephone Encounter (Signed)
R knee Monovisc initiated through portal.

## 2022-09-12 NOTE — Telephone Encounter (Signed)
R knee Monovisc approved. Pt made aware.

## 2022-09-13 ENCOUNTER — Ambulatory Visit: Payer: Medicare Other

## 2022-09-13 ENCOUNTER — Ambulatory Visit (INDEPENDENT_AMBULATORY_CARE_PROVIDER_SITE_OTHER): Payer: Medicare Other | Admitting: Family Medicine

## 2022-09-13 ENCOUNTER — Telehealth: Payer: Self-pay | Admitting: Podiatry

## 2022-09-13 VITALS — BP 138/86 | HR 76 | Ht 73.0 in | Wt 201.0 lb

## 2022-09-13 DIAGNOSIS — G8929 Other chronic pain: Secondary | ICD-10-CM

## 2022-09-13 DIAGNOSIS — M1711 Unilateral primary osteoarthritis, right knee: Secondary | ICD-10-CM | POA: Diagnosis not present

## 2022-09-13 DIAGNOSIS — I2510 Atherosclerotic heart disease of native coronary artery without angina pectoris: Secondary | ICD-10-CM

## 2022-09-13 DIAGNOSIS — M25561 Pain in right knee: Secondary | ICD-10-CM | POA: Diagnosis not present

## 2022-09-13 MED ORDER — HYALURONAN 88 MG/4ML IX SOSY
88.0000 mg | PREFILLED_SYRINGE | Freq: Once | INTRA_ARTICULAR | Status: AC
  Administered 2022-09-13: 88 mg via INTRA_ARTICULAR

## 2022-09-13 NOTE — Telephone Encounter (Signed)
Left message on vm to schedule appt to pick up orthotics  

## 2022-09-13 NOTE — Progress Notes (Signed)
   I, Peterson Lombard, LAT, ATC acting as a scribe for Lynne Leader, MD.  Keith Vazquez is a 71 y.o. male who presents to [place] at [facility] today for cont'd chronic R knee pain. Pt was last seen by Dr. Georgina Snell on 01/20/22 for a R transverse displaced fracture of the third toe and was given a R knee Monovisc injection. Today, pt reports R knee pain returning. Pt thinks he was walking differently due to the fx in his R foot. Pt feels like this is improving now that he's done PT.    Pertinent review of systems: No fevers or chills   Relevant historical information: Crush injury right foot with broken toes and a Lisfranc injury managed with orthopedics and podiatry.   Exam:  BP 138/86   Pulse 76   Ht '6\' 1"'$  (1.854 m)   Wt 201 lb (91.2 kg)   SpO2 98%   BMI 26.52 kg/m  General: Well Developed, well nourished, and in no acute distress.   MSK: Right knee mild effusion normal motion with crepitation.  Tender palpation medial joint line.    Lab and Radiology Results Monovisc right knee  Procedure: Real-time Ultrasound Guided Injection of right knee superior lateral patellar space Device: Philips Affiniti 50G Images permanently stored and available for review in PACS Verbal informed consent obtained.  Discussed risks and benefits of procedure. Warned about infection, bleeding, damage to structures among others. Patient expresses understanding and agreement Time-out conducted.   Noted no overlying erythema, induration, or other signs of local infection.   Skin prepped in a sterile fashion.   Local anesthesia: Topical Ethyl chloride.   With sterile technique and under real time ultrasound guidance: Monovisc 4 mL injected into right knee. Fluid seen entering the joint capsule.   Completed without difficulty   Advised to call if fevers/chills, erythema, induration, drainage, or persistent bleeding.   Images permanently stored and available for review in the ultrasound  unit.  Impression: Technically successful ultrasound guided injection.   Lot number: [id number]     Assessment and Plan: 71 y.o. male with right knee pain due to DJD exacerbation.  Plan for Monovisc injection today.  Check back as needed. Physical therapy recently completed should be helpful as well. If the Monovisc is not effective he will let me know and we can authorize Zilretta injection and try that.   PDMP not reviewed this encounter. Orders Placed This Encounter  Procedures   [place] LIMITED JOINT SPACE STRUCTURES LOW RIGHT(NO LINKED CHARGES)    Order Specific Question:   Reason for Exam (SYMPTOM  OR DIAGNOSIS REQUIRED)    Answer:   right knee pain    Order Specific Question:   Preferred imaging location?    Answer:   [place]   Meds ordered this encounter  Medications   Hyaluronan (MONOVISC) intra-articular injection 88 mg     Discussed warning signs or symptoms. Please see discharge instructions. Patient expresses understanding.   The above documentation has been reviewed and is accurate and complete Lynne Leader, M.D.

## 2022-09-13 NOTE — Patient Instructions (Addendum)
Thank you for coming in today.   You received an injection today. Seek immediate medical attention if the joint becomes red, extremely painful, or is oozing fluid.   Recheck as needed.   I can do a Zilretta injection. This is a long acting cortisone medicine that also requires authorization.  Let me know ahead of time.

## 2022-09-14 ENCOUNTER — Ambulatory Visit (INDEPENDENT_AMBULATORY_CARE_PROVIDER_SITE_OTHER): Payer: Medicare Other

## 2022-09-14 DIAGNOSIS — M2041 Other hammer toe(s) (acquired), right foot: Secondary | ICD-10-CM

## 2022-09-14 NOTE — Progress Notes (Signed)
Patient presents today to pick up custom molded foot orthotics, diagnosed with hammertoe by Dr. Amalia Hailey.   Orthotics were dispensed and fit was satisfactory. Reviewed instructions for break-in and wear. Written instructions given to patient.  Patient will follow up as needed.   [facility] - order # [id number]

## 2022-09-20 DIAGNOSIS — M19031 Primary osteoarthritis, right wrist: Secondary | ICD-10-CM | POA: Diagnosis not present

## 2022-10-07 ENCOUNTER — Other Ambulatory Visit: Payer: Self-pay | Admitting: Cardiology

## 2022-10-11 NOTE — Progress Notes (Signed)
Cardiology Office Note    Date:  10/17/2022   ID:  Keith Vazquez, DOB 01-28-1951, MRN [id number]  PCP:  [name], MD  Cardiologist:  Suyash Amory Martinique, MD    History of Present Illness:  Keith Vazquez is a 71 y.o. male seen for evaluation of chest pain. He is post afib ablation 05/05/14.   He has an ischemic CM with chronic systolic CHF.   He is s/p CABG in 2000. Cardiac caths in 2002 and 2015 showed atretic LIMA to the LAD which filled by the SVG to the second diagonal. The SVG to the second diagonal, SVG sequentially to the ramus and OM, and the SVG to PDA were all patent. Last Myoview study in April 2016 was felt to be low risk. He had an ETT in June 2017 with no ischemia at 13 METs. He also has a history of HTN.  In January of 2018 he was started on Entresto- reported intolerance of higher doses. He is on Coreg.  He states he could not tolerate aldactone due to severe depression and quit taking it.  Myoview in 2019 showed inferior scar without ischemia and EF 30%. Echo in May 2021 showed EF down to 25-30%. He was experiencing symptoms of early morning dyspnea and some palpitations. These episodes were rare only occurring 4x/year.  He was seen by Dr. [name]. No mention of a ILR but he did discuss consideration of ICD. Not a candidate for CRT due to narrow QRS. 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   He was having paroxysms of symptomatic atrial fibrillation that occur sporadically.   He has an Apple watch which he uses to track these episodes.  This has detected possible atrial fibrillation.  He  wore an event monitor which confirmed he is having paroxysms of atrial fibrillation.  The burden was 2-3%. He was seen by Dr [name]. His note indicates complete discussions about AAD therapy, anticoagulation, repeat ablation, and ICD. Patient did not want to consider ICD. Dr [name] felt that with low Afib burden the best thing to do was to  monitor for now unless Afib burden increases significantly. He is on Eliquis for anticoagulation.   Previously he noted more frequent episodes of afib about every 10 days. These episodes last 12-16 hours. States he notices these more than he used to. He does report that he took amiodarone in the past prior to ablation and tolerated it well.  He does note some depressive symptoms as well "feeling of gloom and doom". 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 Ultimately we decided to resume amiodarone 400 mg daily and Eliquis. On his last visit his Afib had resolved and we reduced amiodarone to 300 mg dail.  He did have hammertoe surgery in August. Since his surgery he is back in the gym working out 40 minutes. He feels much better. He did feel higher dose of Coreg made him SOB so he cut back. No dyspnea now. No chest pain or palpitations. States he quit taking Jardiance because he didn't note any benefit and it was too expensive. Overall he feels great.     Past Medical History:  Diagnosis Date   Atrial flutter ([place])    CHF (congestive heart failure) ([place])    LVEF at time of cath 2002 50%; now has been 35-40% since 2008 (patient has  seen multiple cardiologists over the years including [facility], [place], [facility], [facility] and Tollie Eth, MD)   Coronary artery disease    CABG x5 in 2000; s/p cath 2002 > 4/5 grafts patent, LVEF 50%   Degeneration of lumbar intervertebral disc    GERD (gastroesophageal reflux disease)    GI bleed    duodenal ulcer   Hypertension    Borderline   Ischemic heart disease    s/p MI, CABG 2000, s/p cath 2002 with 4/5 grafts patent (LIMA to LAD atretic and occluded mid vessel but adequate flow to distal LAD from collaterals / diagonal   Low back pain    PAC (premature atrial contraction)    PAF (paroxysmal atrial fibrillation) ([place])    initially diagnosed  during stress test 2002   PAF (paroxysmal atrial fibrillation) ([place])    Peyronie's disease     Past Surgical History:  Procedure Laterality Date   ABLATION  05-06-2014   PVI and CTI ablation by Dr [name]   ATRIAL FIBRILLATION ABLATION N/A 05/06/2014   Procedure: ATRIAL FIBRILLATION ABLATION;  Surgeon: [name], MD;  Location: [facility] CATH LAB;  Service: Cardiovascular;  Laterality: N/A;   CAPSULOTOMY Right 06/15/2022   Procedure: CAPSULOTOMY TOES 2-4;  Surgeon: [name], DPM;  Location: [facility] ORS;  Service: Podiatry;  Laterality: Right;   CARDIAC CATHETERIZATION  09/14/2001    Mildly decreased left ventricular systolic function --  Native three vessel coronary artery disease as described. -- Status post coronary artery bypass grafting   CORONARY ARTERY BYPASS GRAFT  2000   LIMA to LAD, SVG to second diagonal, seq SVG to ramus and OM, SVG to PDA   HAMMER TOE SURGERY Right 06/15/2022   Procedure: HAMMER TOE CORRECTION TOES 2-4;  Surgeon: [name], DPM;  Location: [facility] ORS;  Service: Podiatry;  Laterality: Right;   HERNIA REPAIR  05/16/2001   Large left indirect inguinal hernia with right direct hernia.   HERNIA REPAIR     Recurrent left inguinal hernia -- Large left indirect inguinal hernia with right direct hernia.   LEFT HEART CATHETERIZATION WITH CORONARY/GRAFT ANGIOGRAM  11/26/2013   Procedure: LEFT HEART CATHETERIZATION WITH [name];  Surgeon: Naasia Weilbacher M Martinique, MD;  Location: [facility] CATH LAB;  Service: Cardiovascular;;   TEE WITHOUT CARDIOVERSION N/A 05/06/2014   Procedure: TRANSESOPHAGEAL ECHOCARDIOGRAM (TEE);  Surgeon: [name], MD;  Location: [facility] ENDOSCOPY;  Service: Cardiovascular;  Laterality: N/A;    Current Medications: Outpatient Medications Prior to Visit  Medication Sig Dispense Refill   amiodarone (PACERONE) 200 MG tablet Take 1 tablet (200 mg total) by mouth daily. 90 tablet 3   amitriptyline (ELAVIL) 10 MG tablet Take 10 mg by mouth at bedtime.      Ascorbic Acid (VITAMIN C) 1000 MG tablet Take 1,000 mg by mouth daily.     carvedilol (COREG) 12.5 MG tablet Take 1 tablet (12.5 mg total) by mouth 2 (two) times daily with a meal. (Patient taking differently: Take 12.5 mg by mouth daily.) 270 tablet 3   celecoxib (CELEBREX) 200 MG capsule Take 200 mg by mouth 2 (two) times daily as needed.     cholecalciferol (VITAMIN D3) 25 MCG (1000 UNIT) tablet Take 1,000 Units by mouth daily.     ENTRESTO 24-26 MG Take 1 tablet by mouth 2 (two) times daily. 180 tablet 3   gabapentin (NEURONTIN) 100 MG capsule Take by mouth.     tamsulosin (FLOMAX) 0.4 MG CAPS capsule Take  0.4 mg by mouth daily.     vitamin E 200 UNIT capsule Take 200 Units by mouth daily.     empagliflozin (JARDIANCE) 10 MG TABS tablet Take 1 tablet (10 mg total) by mouth daily before breakfast. (Patient not taking: Reported on 10/17/2022) 90 tablet 3   No facility-administered medications prior to visit.     Allergies:   Patient has no known allergies.   Social History   Socioeconomic History   Marital status: Single    Spouse name: Not on file   Number of children: 0   Years of education: Not on file   Highest education level: Not on file  Occupational History   Occupation: garage owner/corporate pilot  Tobacco Use   Smoking status: Former    Packs/day: 1.00    Years: 20.00    Total pack years: 20.00    Types: Cigarettes    Quit date: 10/31/1994    Years since quitting: 27.9   Smokeless tobacco: Never  Vaping Use   Vaping Use: Never used  Substance and Sexual Activity   Alcohol use: No    Alcohol/week: 0.0 standard drinks of alcohol   Drug use: No   Sexual activity: Yes  Other Topics Concern   Not on file  Social History Narrative   Lives in [place].  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  Also flies airplanes   Social Determinants of Health   Financial Resource Strain: Not on file  Food Insecurity: Not on file  Transportation Needs: Not on file   Physical Activity: Not on file  Stress: No Stress Concern Present (02/03/2020)   [place]    Feeling of Stress : Only a little  Social Connections: Not on file     Family History:  The patient's family history is not on file. He was adopted.   ROS:   Please see the history of present illness.    ROS All other systems reviewed and are negative.   PHYSICAL EXAM:   VS:  BP 120/74   Pulse 71   Ht 6' 1.5" (1.867 m)   Wt 188 lb (85.3 kg)   SpO2 97%   BMI 24.47 kg/m    GEN: Well nourished, well developed, in no acute distress  HEENT: normal  Neck: no JVD, carotid bruits, or masses Cardiac: RRR; no murmurs, rubs, or gallops,no edema  Respiratory:  clear to auscultation bilaterally, normal work of breathing GI: soft, nontender, nondistended, + BS MS: no deformity or atrophy  Skin: warm and dry, no rash Neuro:  Alert and Oriented x 3, Strength and sensation are intact Psych: euthymic mood, full affect  Wt Readings from Last 3 Encounters:  10/17/22 188 lb (85.3 kg)  09/13/22 201 lb (91.2 kg)  06/15/22 191 lb (86.6 kg)      Studies/Labs Reviewed:   EKG:  EKG is  ordered today. NSR rate 71. LVH. Old inferior infarct. I have personally reviewed and interpreted this study.    Recent Labs: 05/31/2022: Hemoglobin 15.5; Platelets 168 10/14/2022: ALT 8; BUN 18; Creatinine, Ser 1.21; Potassium 4.3; Sodium 137; TSH 3.780   Lipid Panel    Component Value Date/Time   CHOL 183 04/01/2022 1147   TRIG 185 (H) 04/01/2022 1147   HDL 44 04/01/2022 1147   CHOLHDL 4.2 04/01/2022 1147   CHOLHDL 4 02/08/2021 1134   VLDL 38.0 02/08/2021 1134   LDLCALC 107 (H) 04/01/2022 1147   LDLDIRECT 120.5 09/30/2013 1400  Additional studies/ records that were reviewed today include:  ETT 04/21/16: Study Highlights   There was no ST segment deviation noted during stress.   Ambient PVC;s at rest, with stress and recovery No  significant NSVT No ischemia at 13.2 METS and 102% of PMHR   Echo: 04/21/16: Study Conclusions   - Left ventricle: The cavity size was normal. Wall thickness was   normal. Basal to mid inferior akinesis. Basal to mid   inferolateral akinesis. Anterolateral hypokinesis. Systolic   function was mildly to moderately reduced. The estimated ejection   fraction was in the range of 40% to 45%. Doppler parameters are   consistent with abnormal left ventricular relaxation (grade 1   diastolic dysfunction). - Aortic valve: There was no stenosis. - Aorta: Mildly dilated aortic root. Aortic root dimension: 38 mm   (ED). - Mitral valve: Mildly calcified annulus. There was trivial   regurgitation. - Left atrium: The atrium was mildly dilated. - Right ventricle: The cavity size was normal. Systolic function   was mildly reduced. - Tricuspid valve: Peak RV-RA gradient (S): 16 mm Hg. - Pulmonary arteries: PA peak pressure: 19 mm Hg (S). - Inferior vena cava: The vessel was normal in size. The   respirophasic diameter changes were in the normal range (= 50%),   consistent with normal central venous pressure.   Impressions:   - Normal LV size with EF 40-45%. Wall motion abnormalities as noted   above. Normal RV size with mildly decreased systolic function. No   significant valvular abnormalities.  Myoview 11/10/17: Study Highlights    Nuclear stress EF: 30%. The left ventricular ejection fraction is moderately decreased (30-44%). Defect 1: There is a defect present in the basal inferolateral and mid inferolateral location. Findings consistent with prior myocardial infarction. This is a high risk study.   High risk stress nuclear study due to severely reduced global systolic function. There is evidence of an inferolateral scar (previously seen), but there is no reversible ischemia. There is global hypokinesis, worse in the inferolateral wall. LVEF has decreased slightly from 2016 (previously  36%), otherwise little change.   Echo 03/16/20: IMPRESSIONS     1. Global hypokinesis with akinesis of the inferobasal and inferolateral  walls; overall severe LV dysfunction; moderate LVE; mild LVH; grade 1  diastolic dysfunction; mildly dilated aortic root.   2. Left ventricular ejection fraction, by estimation, is 25 to 30%. The  left ventricle has severely decreased function. The left ventricle  demonstrates regional wall motion abnormalities (see scoring  diagram/findings for description). The left  ventricular internal cavity size was moderately dilated. There is mild  left ventricular hypertrophy. Left ventricular diastolic parameters are  consistent with Grade I diastolic dysfunction (impaired relaxation).   3. Right ventricular systolic function is normal. The right ventricular  size is normal.   4. The mitral valve is normal in structure. Trivial mitral valve  regurgitation. No evidence of mitral stenosis.   5. The aortic valve is tricuspid. Aortic valve regurgitation is not  visualized. No aortic stenosis is present.   6. Aortic dilatation noted. There is mild dilatation of the aortic root  measuring 38 mm.   08/13/20: Event monitor: Study Highlights  Normal sinus rhythm Paroxysmal atrial fibrillation with rapid response and some aberrantion NSVT up to 5 beats     ASSESSMENT:    1. PAF (paroxysmal atrial fibrillation) ([place])   2. Chronic systolic heart failure ([place])   3. Coronary artery disease involving native coronary artery of native  heart without angina pectoris   4. Hyperlipidemia LDL goal <70      PLAN:   1.  CAD: s/p CABG 2000. Known atretic LIMA to LAD but other grafts patent by cath in 2015.  -He is on a beta-blocker - no significant angina. Class 1.  - off ASA since on Eliquis  2.  Chronic systolic CHF: EF 72-62% -He is on the carvedilol and Entresto, compliant with both. - intolerant of aldactone 25 mg daily due to depression - Jardiance too  expensive.  - did not tolerate higher Coreg dose due to dyspnea - refused ICD   3.  Hypertension: -BP is well controlled.    4.  Hyperlipidemia: -He is not on a statin, but is taking coenzyme Q 10 and niacin. -Target LDL is less than 70 - we discussed results of lipid panel. He is not interested in taking lipid lowering therapy.   5.  Paroxysmal Afib - significant improvement of symptoms on amiodarone. Rate controlled on Coreg.  - NSR today - [place] vasc score of 4- On Eliquis.  - continue amiodarone to 200 mg daily.  - follow up in 6 months.    Waylynn Benefiel Martinique, MD ,[facility] 10/17/2022 10:49 AM    [place] [address], [place], [place], [address] [phone]

## 2022-10-14 LAB — COMPREHENSIVE METABOLIC PANEL
ALT: 8 IU/L (ref 0–44)
AST: 21 IU/L (ref 0–40)
Albumin/Globulin Ratio: 1.7 (ref 1.2–2.2)
Albumin: 4.5 g/dL (ref 3.8–4.8)
Alkaline Phosphatase: 105 IU/L (ref 44–121)
BUN/Creatinine Ratio: 15 (ref 10–24)
BUN: 18 mg/dL (ref 8–27)
Bilirubin Total: 0.6 mg/dL (ref 0.0–1.2)
CO2: 24 mmol/L (ref 20–29)
Calcium: 9.4 mg/dL (ref 8.6–10.2)
Chloride: 101 mmol/L (ref 96–106)
Creatinine, Ser: 1.21 mg/dL (ref 0.76–1.27)
Globulin, Total: 2.7 g/dL (ref 1.5–4.5)
Glucose: 106 mg/dL — ABNORMAL HIGH (ref 70–99)
Potassium: 4.3 mmol/L (ref 3.5–5.2)
Sodium: 137 mmol/L (ref 134–144)
Total Protein: 7.2 g/dL (ref 6.0–8.5)
eGFR: 63 mL/min/{1.73_m2} (ref >59)

## 2022-10-14 LAB — TSH: TSH: 3.78 u[IU]/mL (ref 0.450–4.500)

## 2022-10-17 ENCOUNTER — Encounter: Payer: Self-pay | Admitting: Cardiology

## 2022-10-17 ENCOUNTER — Ambulatory Visit: Payer: Medicare Other | Attending: Cardiology | Admitting: Cardiology

## 2022-10-17 VITALS — BP 120/74 | HR 71 | Ht 73.5 in | Wt 188.0 lb

## 2022-10-17 DIAGNOSIS — I2510 Atherosclerotic heart disease of native coronary artery without angina pectoris: Secondary | ICD-10-CM | POA: Insufficient documentation

## 2022-10-17 DIAGNOSIS — E785 Hyperlipidemia, unspecified: Secondary | ICD-10-CM | POA: Insufficient documentation

## 2022-10-17 DIAGNOSIS — I5022 Chronic systolic (congestive) heart failure: Secondary | ICD-10-CM | POA: Diagnosis not present

## 2022-10-17 DIAGNOSIS — I480 Paroxysmal atrial fibrillation: Secondary | ICD-10-CM | POA: Diagnosis not present

## 2022-11-28 ENCOUNTER — Encounter: Payer: Self-pay | Admitting: Family Medicine

## 2022-12-07 NOTE — Progress Notes (Unsigned)
   I, Peterson Lombard, LAT, ATC acting as a scribe for Lynne Leader, MD.  Keith Vazquez is a 72 y.o. male who presents to [place] at [facility] today for cont'd chronic R knee pain. Pt was last seen by Dr. Georgina Snell on 09/13/22 and was given a R knee Monovisc injection. Pt sent a MyChart message on 11/28/22 reporting persistent R knee pain and Zilretta was authorized. Today, pt reports   Dx imaging: 04/13/21 R knee XR  Pertinent review of systems: ***  Relevant historical information: ***   Exam:  There were no vitals taken for this visit. General: Well Developed, well nourished, and in no acute distress.   MSK: ***    Lab and Radiology Results No results found for this or any previous visit (from the past 72 hour(s)). No results found.     Assessment and Plan: 72 y.o. male with ***   PDMP not reviewed this encounter. No orders of the defined types were placed in this encounter.  No orders of the defined types were placed in this encounter.    Discussed warning signs or symptoms. Please see discharge instructions. Patient expresses understanding.   ***

## 2022-12-08 ENCOUNTER — Ambulatory Visit (INDEPENDENT_AMBULATORY_CARE_PROVIDER_SITE_OTHER): Payer: Medicare Other | Admitting: Family Medicine

## 2022-12-08 ENCOUNTER — Ambulatory Visit: Payer: Self-pay

## 2022-12-08 ENCOUNTER — Encounter: Payer: Self-pay | Admitting: Family Medicine

## 2022-12-08 VITALS — BP 130/84 | HR 88 | Ht 73.5 in | Wt 201.8 lb

## 2022-12-08 DIAGNOSIS — M1711 Unilateral primary osteoarthritis, right knee: Secondary | ICD-10-CM

## 2022-12-08 DIAGNOSIS — M25561 Pain in right knee: Secondary | ICD-10-CM

## 2022-12-08 DIAGNOSIS — G8929 Other chronic pain: Secondary | ICD-10-CM | POA: Diagnosis not present

## 2022-12-08 MED ORDER — TRIAMCINOLONE ACETONIDE 32 MG IX SRER
32.0000 mg | Freq: Once | INTRA_ARTICULAR | Status: AC
  Administered 2022-12-08: 32 mg via INTRA_ARTICULAR

## 2022-12-08 NOTE — Patient Instructions (Signed)
Thank you for coming in today.   You received an injection today. Seek immediate medical attention if the joint becomes red, extremely painful, or is oozing fluid.   Check back as needed

## 2022-12-12 ENCOUNTER — Encounter: Payer: Self-pay | Admitting: Family Medicine

## 2023-01-16 NOTE — Progress Notes (Unsigned)
Cardiology Office Note    Date:  01/17/2023   ID:  Keith Vazquez, DOB 1950-12-30, MRN [id number]  PCP:  [name], MD  Cardiologist:  Lauralynn Loeb Martinique, MD    History of Present Illness:  Keith Vazquez is a 72 y.o. male seen for evaluation of chest pain. He is post afib ablation 05/05/14.   He has an ischemic CM with chronic systolic CHF.   He is s/p CABG in 2000. Cardiac caths in 2002 and 2015 showed atretic LIMA to the LAD which filled by the SVG to the second diagonal. The SVG to the second diagonal, SVG sequentially to the ramus and OM, and the SVG to PDA were all patent. Last Myoview study in April 2016 was felt to be low risk. He had an ETT in June 2017 with no ischemia at 13 METs. He also has a history of HTN.  In January of 2018 he was started on Entresto- reported intolerance of higher doses. He is on Coreg.  He states he could not tolerate aldactone due to severe depression and quit taking it.  Myoview in 2019 showed inferior scar without ischemia and EF 30%. Echo in May 2021 showed EF down to 25-30%. He was experiencing symptoms of early morning dyspnea and some palpitations. These episodes were rare only occurring 4x/year.  He was seen by Dr. [name]. No mention of a ILR but he did discuss consideration of ICD. Not a candidate for CRT due to narrow QRS. 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   He was having paroxysms of symptomatic atrial fibrillation that occur sporadically.   He has an Apple watch which he uses to track these episodes.  This has detected possible atrial fibrillation.  He  wore an event monitor which confirmed he is having paroxysms of atrial fibrillation.  The burden was 2-3%. He was seen by Dr [name]. His note indicates complete discussions about AAD therapy, anticoagulation, repeat ablation, and ICD. Patient did not want to consider ICD. Dr [name] felt that with low Afib burden the best thing to do was to  monitor for now unless Afib burden increases significantly. He is on Eliquis for anticoagulation.   Previously he noted more frequent episodes of afib about every 10 days. These episodes last 12-16 hours. States he notices these more than he used to. He does report that he took amiodarone in the past prior to ablation and tolerated it well.  He does note some depressive symptoms as well "feeling of gloom and doom". 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 Ultimately we decided to resume amiodarone 400 mg daily and Eliquis. On his last visit his Afib had resolved and we reduced amiodarone to 300 mg dail.  He did have hammertoe surgery in August 2023. Since his surgery he is back in the gym working out 40 minutes. Marland Kitchen He did feel higher dose of Coreg made him SOB so he cut back. He reports persistent dyspnea so tapered off Coreg completely and felt like this helped but he still feels like he gets winded more than he should. Is able to do his work out without trouble but will get SOB going up stairs. He notes that since stopping Coreg resting HR in 70s and able to get HR up to 130 in gym. No edema. No chest pain or palpitations.  Past Medical History:  Diagnosis Date   Atrial flutter ([place])    CHF (congestive heart failure) (HCC)    LVEF at time of cath 2002 50%; now has been 35-40% since 2008 (patient has seen multiple cardiologists over the years including [facility], [place], [facility], [facility] and Tollie Eth, MD)   Coronary artery disease    CABG x5 in 2000; s/p cath 2002 > 4/5 grafts patent, LVEF 50%   Degeneration of lumbar intervertebral disc    GERD (gastroesophageal reflux disease)    GI bleed    duodenal ulcer   Hypertension    Borderline   Ischemic heart disease    s/p MI, CABG 2000, s/p cath 2002 with 4/5 grafts patent (LIMA to LAD atretic and occluded mid vessel but adequate  flow to distal LAD from collaterals / diagonal   Low back pain    PAC (premature atrial contraction)    PAF (paroxysmal atrial fibrillation) ([place])    initially diagnosed during stress test 2002   PAF (paroxysmal atrial fibrillation) ([place])    Peyronie's disease     Past Surgical History:  Procedure Laterality Date   ABLATION  05-06-2014   PVI and CTI ablation by Dr [name]   ATRIAL FIBRILLATION ABLATION N/A 05/06/2014   Procedure: ATRIAL FIBRILLATION ABLATION;  Surgeon: [name], MD;  Location: [facility];  Service: Cardiovascular;  Laterality: N/A;   CAPSULOTOMY Right 06/15/2022   Procedure: CAPSULOTOMY TOES 2-4;  Surgeon: [name], DPM;  Location: [facility] OR[facility];  Service: Podiatry;  Laterality: Right;   CARDIAC CATHETERIZATION  09/14/2001    Mildly decreased left ventricular systolic function --  Native three vessel coronary artery disease as described. -- Status post coronary artery bypass grafting   CORONARY ARTERY BYPASS GRAFT  2000   LIMA to LAD, SVG to second diagonal, seq SVG to ramus and OM, SVG to PDA   HAMMER TOE SURGERY Right 06/15/2022   Procedure: HAMMER TOE CORRECTION TOES 2-4;  Surgeon: [name], DPM;  Location: [facility];  Service: Podiatry;  Laterality: Right;   HERNIA REPAIR  05/16/2001   Large left indirect inguinal hernia with right direct hernia.   HERNIA REPAIR     Recurrent left inguinal hernia -- Large left indirect inguinal hernia with right direct hernia.   LEFT HEART CATHETERIZATION WITH CORONARY/GRAFT ANGIOGRAM  11/26/2013   Procedure: LEFT HEART CATHETERIZATION WITH [name];  Surgeon: Savien Mamula M Martinique, MD;  Location: [facility] CATH LAB;  Service: Cardiovascular;;   TEE WITHOUT CARDIOVERSION N/A 05/06/2014   Procedure: TRANSESOPHAGEAL ECHOCARDIOGRAM (TEE);  Surgeon: [name], MD;  Location: [facility] ENDOSCOPY;  Service: Cardiovascular;  Laterality: N/A;    Current Medications: Outpatient Medications Prior to Visit  Medication Sig Dispense  Refill   amiodarone (PACERONE) 200 MG tablet Take 1 tablet (200 mg total) by mouth daily. 90 tablet 3   amitriptyline (ELAVIL) 10 MG tablet Take 10 mg by mouth at bedtime.     Ascorbic Acid (VITAMIN C) 1000 MG tablet Take 1,000 mg by mouth daily.     celecoxib (CELEBREX) 200 MG capsule Take 200 mg by mouth 2 (two) times daily as needed.     cholecalciferol (VITAMIN D3) 25 MCG (1000 UNIT) tablet Take 1,000 Units by mouth daily.     gabapentin (NEURONTIN) 100 MG capsule Take by mouth.     tamsulosin (FLOMAX) 0.4 MG CAPS capsule Take 0.4 mg by mouth daily.     vitamin E 200 UNIT capsule  Take 200 Units by mouth daily.     carvedilol (COREG) 12.5 MG tablet Take 1 tablet (12.5 mg total) by mouth 2 (two) times daily with a meal. (Patient taking differently: Take 12.5 mg by mouth daily.) 270 tablet 3   ENTRESTO 24-26 MG Take 1 tablet by mouth 2 (two) times daily. 180 tablet 3   No facility-administered medications prior to visit.     Allergies:   Patient has no known allergies.   Social History   Socioeconomic History   Marital status: Single    Spouse name: Not on file   Number of children: 0   Years of education: Not on file   Highest education level: Not on file  Occupational History   Occupation: garage owner/corporate pilot  Tobacco Use   Smoking status: Former    Packs/day: 1.00    Years: 20.00    Additional pack years: 0.00    Total pack years: 20.00    Types: Cigarettes    Quit date: 10/31/1994    Years since quitting: 28.2   Smokeless tobacco: Never  Vaping Use   Vaping Use: Never used  Substance and Sexual Activity   Alcohol use: No    Alcohol/week: 0.0 standard drinks of alcohol   Drug use: No   Sexual activity: Yes  Other Topics Concern   Not on file  Social History Narrative   Lives in [place].  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  Also flies airplanes   Social Determinants of Health   Financial Resource Strain: Not on file  Food Insecurity: Not on  file  Transportation Needs: Not on file  Physical Activity: Not on file  Stress: No Stress Concern Present (02/03/2020)   [place]    Feeling of Stress : Only a little  Social Connections: Not on file     Family History:  The patient's family history is not on file. He was adopted.   ROS:   Please see the history of present illness.    ROS All other systems reviewed and are negative.   PHYSICAL EXAM:   VS:  BP 122/72   Pulse (!) 101   Ht 6\' 2"  (1.88 m)   Wt 197 lb 6.4 oz (89.5 kg)   SpO2 92%   BMI 25.34 kg/m    GEN: Well nourished, well developed, in no acute distress  HEENT: normal  Neck: no JVD, carotid bruits, or masses Cardiac: RRR; no murmurs, rubs, or gallops,no edema  Respiratory:  clear to auscultation bilaterally, normal work of breathing GI: soft, nontender, nondistended, + BS MS: no deformity or atrophy  Skin: warm and dry, no rash Neuro:  Alert and Oriented x 3, Strength and sensation are intact Psych: euthymic mood, full affect  Wt Readings from Last 3 Encounters:  01/17/23 197 lb 6.4 oz (89.5 kg)  12/08/22 201 lb 12.8 oz (91.5 kg)  10/17/22 188 lb (85.3 kg)      Studies/Labs Reviewed:   EKG:  EKG is not  ordered today.     Recent Labs: 05/31/2022: Hemoglobin 15.5; Platelets 168 10/14/2022: ALT 8; BUN 18; Creatinine, Ser 1.21; Potassium 4.3; Sodium 137; TSH 3.780   Lipid Panel    Component Value Date/Time   CHOL 183 04/01/2022 1147   TRIG 185 (H) 04/01/2022 1147   HDL 44 04/01/2022 1147   CHOLHDL 4.2 04/01/2022 1147   CHOLHDL 4 02/08/2021 1134   VLDL 38.0 02/08/2021 1134   LDLCALC  107 (H) 04/01/2022 1147   LDLDIRECT 120.5 09/30/2013 1400    Additional studies/ records that were reviewed today include:  ETT 04/21/16: Study Highlights   There was no ST segment deviation noted during stress.   Ambient PVC;s at rest, with stress and recovery No significant NSVT No ischemia  at 13.2 METS and 102% of PMHR   Echo: 04/21/16: Study Conclusions   - Left ventricle: The cavity size was normal. Wall thickness was   normal. Basal to mid inferior akinesis. Basal to mid   inferolateral akinesis. Anterolateral hypokinesis. Systolic   function was mildly to moderately reduced. The estimated ejection   fraction was in the range of 40% to 45%. Doppler parameters are   consistent with abnormal left ventricular relaxation (grade 1   diastolic dysfunction). - Aortic valve: There was no stenosis. - Aorta: Mildly dilated aortic root. Aortic root dimension: 38 mm   (ED). - Mitral valve: Mildly calcified annulus. There was trivial   regurgitation. - Left atrium: The atrium was mildly dilated. - Right ventricle: The cavity size was normal. Systolic function   was mildly reduced. - Tricuspid valve: Peak RV-RA gradient (S): 16 mm Hg. - Pulmonary arteries: PA peak pressure: 19 mm Hg (S). - Inferior vena cava: The vessel was normal in size. The   respirophasic diameter changes were in the normal range (= 50%),   consistent with normal central venous pressure.   Impressions:   - Normal LV size with EF 40-45%. Wall motion abnormalities as noted   above. Normal RV size with mildly decreased systolic function. No   significant valvular abnormalities.  Myoview 11/10/17: Study Highlights    Nuclear stress EF: 30%. The left ventricular ejection fraction is moderately decreased (30-44%). Defect 1: There is a defect present in the basal inferolateral and mid inferolateral location. Findings consistent with prior myocardial infarction. This is a high risk study.   High risk stress nuclear study due to severely reduced global systolic function. There is evidence of an inferolateral scar (previously seen), but there is no reversible ischemia. There is global hypokinesis, worse in the inferolateral wall. LVEF has decreased slightly from 2016 (previously 36%), otherwise little  change.   Echo 03/16/20: IMPRESSIONS     1. Global hypokinesis with akinesis of the inferobasal and inferolateral  walls; overall severe LV dysfunction; moderate LVE; mild LVH; grade 1  diastolic dysfunction; mildly dilated aortic root.   2. Left ventricular ejection fraction, by estimation, is 25 to 30%. The  left ventricle has severely decreased function. The left ventricle  demonstrates regional wall motion abnormalities (see scoring  diagram/findings for description). The left  ventricular internal cavity size was moderately dilated. There is mild  left ventricular hypertrophy. Left ventricular diastolic parameters are  consistent with Grade I diastolic dysfunction (impaired relaxation).   3. Right ventricular systolic function is normal. The right ventricular  size is normal.   4. The mitral valve is normal in structure. Trivial mitral valve  regurgitation. No evidence of mitral stenosis.   5. The aortic valve is tricuspid. Aortic valve regurgitation is not  visualized. No aortic stenosis is present.   6. Aortic dilatation noted. There is mild dilatation of the aortic root  measuring 38 mm.   08/13/20: Event monitor: Study Highlights  Normal sinus rhythm Paroxysmal atrial fibrillation with rapid response and some aberrantion NSVT up to 5 beats     ASSESSMENT:    1. PAF (paroxysmal atrial fibrillation) ([place])   2. Chronic systolic heart failure (  [place])   3. Coronary artery disease involving native coronary artery of native heart without angina pectoris   4. Hyperlipidemia LDL goal <70   5. Essential hypertension      PLAN:   1.  CAD: s/p CABG 2000. Known atretic LIMA to LAD but other grafts patent by cath in 2015. Prior Myoview studies have been abnormal so I don't think would be very helpful to assess circulation now.  - he has class 1 angina.   - patient defers taking ASA or anticoagulant at this time due to concern about bleeding. 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  - if he needs ischemic evaluation I think cardiac cath would be the only useful way to evaluate  2.  Chronic systolic CHF: EF [id number] -reports intolerance of Coreg due to dyspnea. Has stopped on his own.  - intolerant of aldactone 25 mg daily due to depression - Jardiance in past too expensive. May be willing to resume - will go ahead and increase Entresto to 49/51 mg bid - refused ICD - will update Echo. If EF still low would argue to go back on Jardiance.  - will repeat labs and Ecg on follow up visit in 2 months.    3.  Hypertension: -BP is well controlled.    4.  Hyperlipidemia: -He is not on a statin, but is taking coenzyme Q 10 and niacin. -Target LDL is less than 70 - we discussed results of lipid panel. He is not interested in taking lipid lowering therapy. - will update labs next visit.    5.  Paroxysmal Afib -appears to be maintaining  NSR on amiodarone.  - NSR today - [place] vasc score of 4- patient stopped taking Eliquis on his own due to concern about bruising.  - continue amiodarone to 200 mg daily.  - follow up in 2  months.    Buckley Bradly Martinique, MD ,[facility] 01/17/2023 9:08 AM    [facility] [address] [place], [place], [address] [phone]

## 2023-01-17 ENCOUNTER — Encounter: Payer: Self-pay | Admitting: Cardiology

## 2023-01-17 ENCOUNTER — Ambulatory Visit: Payer: Medicare Other | Attending: Cardiology | Admitting: Cardiology

## 2023-01-17 VITALS — BP 122/72 | HR 101 | Ht 74.0 in | Wt 197.4 lb

## 2023-01-17 DIAGNOSIS — I10 Essential (primary) hypertension: Secondary | ICD-10-CM

## 2023-01-17 DIAGNOSIS — I2510 Atherosclerotic heart disease of native coronary artery without angina pectoris: Secondary | ICD-10-CM | POA: Diagnosis present

## 2023-01-17 DIAGNOSIS — E785 Hyperlipidemia, unspecified: Secondary | ICD-10-CM | POA: Insufficient documentation

## 2023-01-17 DIAGNOSIS — I5022 Chronic systolic (congestive) heart failure: Secondary | ICD-10-CM | POA: Diagnosis present

## 2023-01-17 DIAGNOSIS — I480 Paroxysmal atrial fibrillation: Secondary | ICD-10-CM | POA: Diagnosis present

## 2023-01-17 MED ORDER — SACUBITRIL-VALSARTAN 49-51 MG PO TABS: 1.0000 | ORAL_TABLET | Freq: Two times a day (BID) | ORAL | 3 refills | Status: DC

## 2023-01-17 NOTE — Patient Instructions (Signed)
Medication Instructions:  Increase Entresto to 49/51 mg twice a day Continue all other medications *If you need a refill on your cardiac medications before your next appointment, please call your pharmacy*   Lab Work: Have the following lab done at your next visit or 3 to 4 days before  cmet,lipid panel,cbc,tsh,bnp    Testing/Procedures: Schedule Echo   Follow-Up: At [facility], you and your health needs are our priority.  As part of our continuing mission to provide you with exceptional heart care, we have created designated Provider Care Teams.  These Care Teams include your primary Cardiologist (physician) and Advanced Practice Providers (APPs -  Physician Assistants and Nurse Practitioners) who all work together to provide you with the care you need, when you need it.  We recommend signing up for the patient portal called "MyChart".  Sign up information is provided on this After Visit Summary.  MyChart is used to connect with patients for Virtual Visits (Telemedicine).  Patients are able to view lab/test results, encounter notes, upcoming appointments, etc.  Non-urgent messages can be sent to your provider as well.   To learn more about what you can do with MyChart, go to [email or web address].    Your next appointment:  2 months    Provider:  Dr.Jordan

## 2023-01-27 ENCOUNTER — Encounter: Payer: Self-pay | Admitting: Cardiology

## 2023-01-27 NOTE — Telephone Encounter (Signed)
Spoke to patient Dr.Jordan's advice given.He will decrease Entresto to 24/26 mg 1/2 tablet twice a day.Advised to call back to report symptoms.

## 2023-02-10 ENCOUNTER — Ambulatory Visit (HOSPITAL_COMMUNITY): Payer: Medicare Other | Attending: Cardiology

## 2023-02-10 DIAGNOSIS — I2510 Atherosclerotic heart disease of native coronary artery without angina pectoris: Secondary | ICD-10-CM | POA: Diagnosis present

## 2023-02-10 DIAGNOSIS — I5022 Chronic systolic (congestive) heart failure: Secondary | ICD-10-CM

## 2023-02-10 DIAGNOSIS — E785 Hyperlipidemia, unspecified: Secondary | ICD-10-CM

## 2023-02-10 DIAGNOSIS — I480 Paroxysmal atrial fibrillation: Secondary | ICD-10-CM | POA: Diagnosis present

## 2023-02-10 DIAGNOSIS — I10 Essential (primary) hypertension: Secondary | ICD-10-CM

## 2023-02-10 LAB — ECHOCARDIOGRAM COMPLETE
Area-P 1/2: 1.48 cm2
S' Lateral: 5.2 cm

## 2023-03-03 ENCOUNTER — Encounter: Payer: Self-pay | Admitting: Family Medicine

## 2023-03-06 ENCOUNTER — Telehealth: Payer: Self-pay

## 2023-03-06 NOTE — Telephone Encounter (Signed)
Zilretta inj for RIGHT knee OA on 12/08/22 Can consider repeat on or after 03/03/23

## 2023-03-06 NOTE — Telephone Encounter (Signed)
Forwarding to Dr. Denyse Amass to review and advise.   I will re-check benefits for repeat Zilretta inj for right knee OA.

## 2023-03-06 NOTE — Telephone Encounter (Signed)
VOB initiated for Zilretta for RIGHT knee OA.  

## 2023-03-07 NOTE — Telephone Encounter (Signed)
Holding until patient is ready per previous MyChart message.

## 2023-03-07 NOTE — Telephone Encounter (Signed)
Zilretta for RIGHT knee OA  Primary Insurance: Medicare Co-Pay: n/a Co-Insurance: 20% Deductible: $240 of $240 met Prior Auth: NOT required  Secondary insurance: [employer] [place] Co-Pay: n/a Co-Insurance: Covers Medicare Part B co-insurance Deductible:  Covered by secondary Prior Auth: NOT required  Last Zilretta inj 12/08/22 Can consider repeat in on or after 03/03/23

## 2023-03-21 NOTE — Progress Notes (Deleted)
Cardiology Office Note    Date:  03/21/2023   ID:  Keith Vazquez, DOB [birth year], MRN [id number]  PCP:  Keith Grandchild, MD  Cardiologist:  Fannie Alomar [place]az[place], MD    History of Present Illness:  Keith Vazquez is a 72 y.o. male seen for evaluation of chest pain. He is post afib ablation 05/05/14.   He has an ischemic CM with chronic systolic CHF.   He is s/p CABG in 2000. Cardiac caths in 2002 and 2015 showed atretic LIMA to the LAD which filled by the SVG to the second diagonal. The SVG to the second diagonal, SVG sequentially to the ramus and OM, and the SVG to PDA were all patent. Last Myoview study in April 2016 was felt to be low risk. He had an ETT in June 2017 with no ischemia at 13 METs. He also has a history of HTN.  In January of 2018 he was started on Entresto- reported intolerance of higher doses. He is on Coreg.  He states he could not tolerate aldactone due to severe depression and quit taking it.  Myoview in 2019 showed inferior scar without ischemia and EF 30%. Echo in May 2021 showed EF down to 25-30%. He was experiencing symptoms of early morning dyspnea and some palpitations. These episodes were rare only occurring 4x/year.  He was seen by Dr. Johney Frame. No mention of a ILR but he did discuss consideration of ICD. Not a candidate for CRT due to narrow QRS. 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   He was having paroxysms of symptomatic atrial fibrillation that occur sporadically.   He has an Apple watch which he uses to track these episodes.  This has detected possible atrial fibrillation.  He  wore an event monitor which confirmed he is having paroxysms of atrial fibrillation.  The burden was 2-3%. He was seen by Dr Lalla Brothers. His note indicates complete discussions about AAD therapy, anticoagulation, repeat ablation, and ICD. Patient did not want to consider ICD. Dr Lalla Brothers felt that with low Afib burden the best thing to do was to  monitor for now unless Afib burden increases significantly. He is on Eliquis for anticoagulation.   Previously he noted more frequent episodes of afib about every 10 days. These episodes last 12-16 hours. States he notices these more than he used to. He does report that he took amiodarone in the past prior to ablation and tolerated it well.  He does note some depressive symptoms as well "feeling of gloom and doom". 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 Ultimately we decided to resume amiodarone 400 mg daily and Eliquis. On his last visit his Afib had resolved and we reduced amiodarone to 300 mg dail.  He did have hammertoe surgery in August 2023. Since his surgery he is back in the gym working out 40 minutes. Marland Kitchen He did feel higher dose of Coreg made him SOB so he cut back. He reports persistent dyspnea so tapered off Coreg completely and felt like this helped but he still feels like he gets winded more than he should. Is able to do his work out without trouble but will get SOB going up stairs. He notes that since stopping Coreg resting HR in 70s and able to get HR up to 130 in gym. No edema. No chest pain or palpitations.  We repeated Echo which showed EF 30-35% with little change from prior. Recommended retrial of Jardiance.     Past Medical History:  Diagnosis Date   Atrial flutter (HCC)    CHF (congestive heart failure) (HCC)    LVEF at time of cath 2002 50%; now has been 35-40% since 2008 (patient has seen multiple cardiologists over the years including [facility], [place], [facility], [facility] and Viann Fish, MD)   Coronary artery disease    CABG x5 in 2000; s/p cath 2002 > 4/5 grafts patent, LVEF 50%   Degeneration of lumbar intervertebral disc    GERD (gastroesophageal reflux disease)    GI bleed    duodenal ulcer   Hypertension    Borderline   Ischemic heart disease    s/p MI,  CABG 2000, s/p cath 2002 with 4/5 grafts patent (LIMA to LAD atretic and occluded mid vessel but adequate flow to distal LAD from collaterals / diagonal   Low back pain    PAC (premature atrial contraction)    PAF (paroxysmal atrial fibrillation) (HCC)    initially diagnosed during stress test 2002   PAF (paroxysmal atrial fibrillation) (HCC)    Peyronie's disease     Past Surgical History:  Procedure Laterality Date   ABLATION  05-06-2014   PVI and CTI ablation by Dr Johney Frame   ATRIAL FIBRILLATION ABLATION N/A 05/06/2014   Procedure: ATRIAL FIBRILLATION ABLATION;  Surgeon: Gardiner Rhyme, MD;  Location: [facility] CATH LAB;  Service: Cardiovascular;  Laterality: N/A;   CAPSULOTOMY Right 06/15/2022   Procedure: CAPSULOTOMY TOES 2-4;  Surgeon: Felecia Shelling, DPM;  Location: [facility] ORS;  Service: Podiatry;  Laterality: Right;   CARDIAC CATHETERIZATION  09/14/2001    Mildly decreased left ventricular systolic function --  Native three vessel coronary artery disease as described. -- Status post coronary artery bypass grafting   CORONARY ARTERY BYPASS GRAFT  2000   LIMA to LAD, SVG to second diagonal, seq SVG to ramus and OM, SVG to PDA   HAMMER TOE SURGERY Right 06/15/2022   Procedure: HAMMER TOE CORRECTION TOES 2-4;  Surgeon: Felecia Shelling, DPM;  Location: [facility] ORS;  Service: Podiatry;  Laterality: Right;   HERNIA REPAIR  05/16/2001   Large left indirect inguinal hernia with right direct hernia.   HERNIA REPAIR     Recurrent left inguinal hernia -- Large left indirect inguinal hernia with right direct hernia.   LEFT HEART CATHETERIZATION WITH CORONARY/GRAFT ANGIOGRAM  11/26/2013   Procedure: LEFT HEART CATHETERIZATION WITH Isabel Caprice;  Surgeon: Mathew Postiglione M [place], MD;  Location: [facility] CATH LAB;  Service: Cardiovascular;;   TEE WITHOUT CARDIOVERSION N/A 05/06/2014   Procedure: TRANSESOPHAGEAL ECHOCARDIOGRAM (TEE);  Surgeon: Lars Masson, MD;  Location: [facility] ENDOSCOPY;  Service: Cardiovascular;   Laterality: N/A;    Current Medications: Outpatient Medications Prior to Visit  Medication Sig Dispense Refill   amiodarone (PACERONE) 200 MG tablet Take 1 tablet (200 mg total) by mouth daily. 90 tablet 3   amitriptyline (ELAVIL) 10 MG tablet Take 10 mg by mouth at bedtime.     Ascorbic Acid (VITAMIN C) 1000 MG tablet Take 1,000 mg by mouth daily.     celecoxib (CELEBREX) 200 MG capsule Take 200 mg by mouth 2 (two) times daily as needed.     cholecalciferol (VITAMIN D3) 25 MCG (1000 UNIT) tablet Take 1,000 Units by mouth daily.     gabapentin (NEURONTIN) 100 MG capsule Take by mouth.     sacubitril-valsartan (  ENTRESTO) 24-26 MG Take 1/2 tablet twice a day 60 tablet    tamsulosin (FLOMAX) 0.4 MG CAPS capsule Take 0.4 mg by mouth daily.     vitamin E 200 UNIT capsule Take 200 Units by mouth daily.     No facility-administered medications prior to visit.     Allergies:   Patient has no known allergies.   Social History   Socioeconomic History   Marital status: Single    Spouse name: Not on file   Number of children: 0   Years of education: Not on file   Highest education level: Not on file  Occupational History   Occupation: garage owner/corporate pilot  Tobacco Use   Smoking status: Former    Packs/day: 1.00    Years: 20.00    Additional pack years: 0.00    Total pack years: 20.00    Types: Cigarettes    Quit date: 10/31/1994    Years since quitting: 28.4   Smokeless tobacco: Never  Vaping Use   Vaping Use: Never used  Substance and Sexual Activity   Alcohol use: No    Alcohol/week: 0.0 standard drinks of alcohol   Drug use: No   Sexual activity: Yes  Other Topics Concern   Not on file  Social History Narrative   Lives in [place].  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  Also flies airplanes   Social Determinants of Health   Financial Resource Strain: Not on file  Food Insecurity: Not on file  Transportation Needs: Not on file  Physical Activity: Not  on file  Stress: No Stress Concern Present (02/03/2020)   [employer] of Occupational Health - Occupational Stress Questionnaire    Feeling of Stress : Only a little  Social Connections: Not on file     Family History:  The patient's family history is not on file. He was adopted.   Vazquez:   Please see the history of present illness.    Vazquez All other systems reviewed and are negative.   PHYSICAL EXAM:   VS:  There were no vitals taken for this visit.   GEN: Well nourished, well developed, in no acute distress  HEENT: normal  Neck: no JVD, carotid bruits, or masses Cardiac: RRR; no murmurs, rubs, or gallops,no edema  Respiratory:  clear to auscultation bilaterally, normal work of breathing GI: soft, nontender, nondistended, + BS MS: no deformity or atrophy  Skin: warm and dry, no rash Neuro:  Alert and Oriented x 3, Strength and sensation are intact Psych: euthymic mood, full affect  Wt Readings from Last 3 Encounters:  01/17/23 197 lb 6.4 oz (89.5 kg)  12/08/22 201 lb 12.8 oz (91.5 kg)  10/17/22 188 lb (85.3 kg)      Studies/Labs Reviewed:   EKG:  EKG is not  ordered today.     Recent Labs: 05/31/2022: Hemoglobin 15.5; Platelets 168 10/14/2022: ALT 8; BUN 18; Creatinine, Ser 1.21; Potassium 4.3; Sodium 137; TSH 3.780   Lipid Panel    Component Value Date/Time   CHOL 183 04/01/2022 1147   TRIG 185 (H) 04/01/2022 1147   HDL 44 04/01/2022 1147   CHOLHDL 4.2 04/01/2022 1147   CHOLHDL 4 02/08/2021 1134   VLDL 38.0 02/08/2021 1134   LDLCALC 107 (H) 04/01/2022 1147   LDLDIRECT 120.5 09/30/2013 1400    Additional studies/ records that were reviewed today include:  ETT 04/21/16: Study Highlights   There was no ST segment deviation noted during stress.   Ambient PVC;s  at rest, with stress and recovery No significant NSVT No ischemia at 13.2 METS and 102% of PMHR   Echo: 04/21/16: Study Conclusions   - Left ventricle: The cavity size was normal. Wall thickness  was   normal. Basal to mid inferior akinesis. Basal to mid   inferolateral akinesis. Anterolateral hypokinesis. Systolic   function was mildly to moderately reduced. The estimated ejection   fraction was in the range of 40% to 45%. Doppler parameters are   consistent with abnormal left ventricular relaxation (grade 1   diastolic dysfunction). - Aortic valve: There was no stenosis. - Aorta: Mildly dilated aortic root. Aortic root dimension: 38 mm   (ED). - Mitral valve: Mildly calcified annulus. There was trivial   regurgitation. - Left atrium: The atrium was mildly dilated. - Right ventricle: The cavity size was normal. Systolic function   was mildly reduced. - Tricuspid valve: Peak RV-RA gradient (S): 16 mm Hg. - Pulmonary arteries: PA peak pressure: 19 mm Hg (S). - Inferior vena cava: The vessel was normal in size. The   respirophasic diameter changes were in the normal range (= 50%),   consistent with normal central venous pressure.   Impressions:   - Normal LV size with EF 40-45%. Wall motion abnormalities as noted   above. Normal RV size with mildly decreased systolic function. No   significant valvular abnormalities.  Myoview 11/10/17: Study Highlights    Nuclear stress EF: 30%. The left ventricular ejection fraction is moderately decreased (30-44%). Defect 1: There is a defect present in the basal inferolateral and mid inferolateral location. Findings consistent with prior myocardial infarction. This is a high risk study.   High risk stress nuclear study due to severely reduced global systolic function. There is evidence of an inferolateral scar (previously seen), but there is no reversible ischemia. There is global hypokinesis, worse in the inferolateral wall. LVEF has decreased slightly from 2016 (previously 36%), otherwise little change.   Echo 03/16/20: IMPRESSIONS     1. Global hypokinesis with akinesis of the inferobasal and inferolateral  walls; overall severe  LV dysfunction; moderate LVE; mild LVH; grade 1  diastolic dysfunction; mildly dilated aortic root.   2. Left ventricular ejection fraction, by estimation, is 25 to 30%. The  left ventricle has severely decreased function. The left ventricle  demonstrates regional wall motion abnormalities (see scoring  diagram/findings for description). The left  ventricular internal cavity size was moderately dilated. There is mild  left ventricular hypertrophy. Left ventricular diastolic parameters are  consistent with Grade I diastolic dysfunction (impaired relaxation).   3. Right ventricular systolic function is normal. The right ventricular  size is normal.   4. The mitral valve is normal in structure. Trivial mitral valve  regurgitation. No evidence of mitral stenosis.   5. The aortic valve is tricuspid. Aortic valve regurgitation is not  visualized. No aortic stenosis is present.   6. Aortic dilatation noted. There is mild dilatation of the aortic root  measuring 38 mm.   08/13/20: Event monitor: Study Highlights  Normal sinus rhythm Paroxysmal atrial fibrillation with rapid response and some aberrantion NSVT up to 5 beats    Echo 02/10/20: IMPRESSIONS     1. Left ventricular ejection fraction, by estimation, is 30 to 35%. The  left ventricle has moderately decreased function. The left ventricle  demonstrates global hypokinesis. The left ventricular internal cavity size  was mildly dilated. Left ventricular  diastolic parameters are consistent with Grade I diastolic dysfunction  (impaired relaxation). There  is akinesis of the left ventricular, basal  inferior wall and inferolateral wall. There is hypokinesis of the left  ventricular, mid-apical inferolateral  wall. The average left ventricular global longitudinal strain is -12.4 %.  The global longitudinal strain is abnormal.   2. Right ventricular systolic function is mildly reduced. The right  ventricular size is normal.   3. The  mitral valve is normal in structure. Trivial mitral valve  regurgitation. No evidence of mitral stenosis.   4. The aortic valve is normal in structure. Aortic valve regurgitation is  not visualized. No aortic stenosis is present.   5. The inferior vena cava is normal in size with greater than 50%  respiratory variability, suggesting right atrial pressure of 3 mmHg.   ASSESSMENT:    No diagnosis found.    PLAN:   1.  CAD: s/p CABG 2000. Known atretic LIMA to LAD but other grafts patent by cath in 2015. Prior Myoview studies have been abnormal so I don't think would be very helpful to assess circulation now.  - he has class 1 angina.   - patient defers taking ASA or anticoagulant at this time due to concern about bleeding. 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  - if he needs ischemic evaluation I think cardiac cath would be the only useful way to evaluate  2.  Chronic systolic CHF: EF 56-21% -reports intolerance of Coreg due to dyspnea. Has stopped on his own.  - intolerant of aldactone 25 mg daily due to depression - Jardiance in past too expensive. May be willing to resume - will go ahead and increase Entresto to 49/51 mg bid - refused ICD - will update Echo. If EF still low would argue to go back on Jardiance.  - will repeat labs and Ecg on follow up visit in 2 months.    3.  Hypertension: -BP is well controlled.    4.  Hyperlipidemia: -He is not on a statin, but is taking coenzyme Q 10 and niacin. -Target LDL is less than 70 - we discussed results of lipid panel. He is not interested in taking lipid lowering therapy. - will update labs next visit.    5.  Paroxysmal Afib -appears to be maintaining  NSR on amiodarone.  - NSR today - [place] vasc score of 4- patient stopped taking Eliquis on his own due to concern about bruising.  - continue amiodarone to 200 mg daily.  - follow up in 2  months.    Shams Fill [place], MD ,[facility] 03/21/2023 8:12 AM     [facility] [address], [place], [place], [address] [phone]

## 2023-03-24 ENCOUNTER — Ambulatory Visit: Payer: BLUE CROSS/BLUE SHIELD | Admitting: Cardiology

## 2023-05-20 ENCOUNTER — Other Ambulatory Visit: Payer: Self-pay | Admitting: Cardiology

## 2023-05-29 ENCOUNTER — Encounter: Payer: Self-pay | Admitting: Family Medicine

## 2023-05-29 NOTE — Telephone Encounter (Signed)
Please reach out to pt to assist with scheduling, thanks!  Zilretta for RIGHT knee OA   Primary Insurance: Medicare Co-Pay: n/a Co-Insurance: 20% Deductible: $240 of $240 met Prior Auth: NOT required   Secondary insurance: BCBS [place] Co-Pay: n/a Co-Insurance: Covers Medicare Part B co-insurance Deductible:  Covered by secondary Prior Auth: NOT required   Last Zilretta inj 12/08/22 Can consider repeat in on or after 03/03/23

## 2023-05-30 NOTE — Telephone Encounter (Signed)
Left message for patient to call back to schedule.  °

## 2023-05-31 ENCOUNTER — Encounter: Payer: Self-pay | Admitting: Family Medicine

## 2023-05-31 ENCOUNTER — Other Ambulatory Visit: Payer: Self-pay

## 2023-05-31 ENCOUNTER — Ambulatory Visit (INDEPENDENT_AMBULATORY_CARE_PROVIDER_SITE_OTHER): Payer: Medicare Other | Admitting: Family Medicine

## 2023-05-31 VITALS — BP 120/82 | HR 64 | Ht 74.0 in | Wt 190.0 lb

## 2023-05-31 DIAGNOSIS — R5383 Other fatigue: Secondary | ICD-10-CM | POA: Diagnosis not present

## 2023-05-31 DIAGNOSIS — G8929 Other chronic pain: Secondary | ICD-10-CM | POA: Diagnosis not present

## 2023-05-31 DIAGNOSIS — M1711 Unilateral primary osteoarthritis, right knee: Secondary | ICD-10-CM

## 2023-05-31 DIAGNOSIS — X509XXA Other and unspecified overexertion or strenuous movements or postures, initial encounter: Secondary | ICD-10-CM

## 2023-05-31 DIAGNOSIS — M25561 Pain in right knee: Secondary | ICD-10-CM

## 2023-05-31 MED ORDER — TRIAMCINOLONE ACETONIDE 32 MG IX SRER
32.0000 mg | Freq: Once | INTRA_ARTICULAR | Status: AC
  Administered 2023-05-31: 32 mg via INTRA_ARTICULAR

## 2023-05-31 NOTE — Patient Instructions (Addendum)
Thank you for coming in today.  You received an injection today. Seek immediate medical attention if the joint becomes red, extremely painful, or is oozing fluid.  Get the labs we suggested.

## 2023-05-31 NOTE — Progress Notes (Signed)
I, Stevenson Clinch, CMA acting as a scribe for [name], MD.  Keith Vazquez is a 72 y.o. male who presents to [facility] today for cont'd chronic R knee pain. Pt was last seen by Dr. Denyse Amass on 09/13/22 and was given a R knee Monovisc injection. Pt was last seen by Dr. Denyse Amass on 12/08/22 and received Zilretta injection for chronic right knee pain and OA.   Today patient reports significant improvement of knee sx s/p Zilretta injection. Sx have slowly returned over the past couple of months but still feels better than prior to Zilretta injection. Denies swelling, mechanical sx occurring less often.  He also mention some exertional intolerance.  This is a chronic ongoing issue occurring for years.  He has had evaluation with a pulmonologist in the somewhat recent past who thought that his lung function was normal.  He does have a relevant medical history for systolic heart failure with an EF of about 30 to 35%.  This was determined with an echocardiogram in the last several months.  His cardiologist did have him on Entresto but he discontinued it thinking it was not helpful or perhaps even causing the exertion intolerance.  Labs were also ordered but not been done yet.  Dx imaging: 04/13/21 R knee XR    Pertinent review of systems: No fevers or chills  Relevant historical information: Systolic heart failure   Exam:  BP 120/82   Pulse 64   Ht 6\' 2"  (1.88 m)   Wt 190 lb (86.2 kg)   SpO2 97%   BMI 24.39 kg/m  General: Well Developed, well nourished, and in no acute distress.   MSK: Right knee mild effusion normal motion with crepitation.  Intact strength.    Lab and Radiology Results   Zilretta injection right knee Procedure: Real-time Ultrasound Guided Injection of right knee joint superior lateral patellar space Device: Philips Affiniti 50G Images permanently stored and available for review in PACS Verbal informed consent obtained.  Discussed  risks and benefits of procedure. Warned about infection, hyperglycemia bleeding, damage to structures among others. Patient expresses understanding and agreement Time-out conducted.   Noted no overlying erythema, induration, or other signs of local infection.   Skin prepped in a sterile fashion.   Local anesthesia: Topical Ethyl chloride.   With sterile technique and under real time ultrasound guidance: Zilretta 32 mg injected into knee joint. Fluid seen entering the joint capsule.   Completed without difficulty   Advised to call if fevers/chills, erythema, induration, drainage, or persistent bleeding.   Images permanently stored and available for review in the ultrasound unit.  Impression: Technically successful ultrasound guided injection.  Lot number: [id number]      Assessment and Plan: 73 y.o. male with right knee pain due to exacerbation of DJD.  Plan to repeat Zilretta injection today as it has been working quite well.  Could proceed with repeat injection every 3 months if needed.  He will keep me updated.  We spent some time talking about his exertional intolerance.  He had a pretty good workup already.  The most likely explanation is his heart failure.  However anemia is a possibility.  His cardiologist ordered labs in March which she has never gotten done.  I will add onto these labs with a CK and sed rate but encouraged him to get fasting labs in my office in the near future.    PDMP not reviewed this encounter. Orders Placed This Encounter  Procedures   [place] LIMITED JOINT SPACE STRUCTURES LOW RIGHT(NO LINKED CHARGES)    Order Specific Question:   Reason for Exam (SYMPTOM  OR DIAGNOSIS REQUIRED)    Answer:   right knee pain / OA    Order Specific Question:   Preferred imaging location?    Answer:   [facility]   [place]    Standing Status:   Future    Standing Expiration Date:   05/30/2024   Sedimentation rate    Standing Status:   Future    Standing  Expiration Date:   05/30/2024   Meds ordered this encounter  Medications   Triamcinolone Acetonide (ZILRETTA) intra-articular injection 32 mg     Discussed warning signs or symptoms. Please see discharge instructions. Patient expresses understanding.   The above documentation has been reviewed and is accurate and complete [name], M.D.

## 2023-06-12 ENCOUNTER — Other Ambulatory Visit: Payer: Medicare Other

## 2023-06-12 DIAGNOSIS — R5383 Other fatigue: Secondary | ICD-10-CM

## 2023-06-12 DIAGNOSIS — X509XXA Other and unspecified overexertion or strenuous movements or postures, initial encounter: Secondary | ICD-10-CM

## 2023-06-12 DIAGNOSIS — G8929 Other chronic pain: Secondary | ICD-10-CM

## 2023-06-12 DIAGNOSIS — M1711 Unilateral primary osteoarthritis, right knee: Secondary | ICD-10-CM

## 2023-06-12 LAB — CK: Total CK: 51 U/L (ref 7–232)

## 2023-06-12 LAB — SEDIMENTATION RATE: Sed Rate: 14 mm/hr (ref 0–20)

## 2023-06-12 NOTE — Telephone Encounter (Signed)
Pt received Zilretta injection for RIGHT knee OA on 05/31/23. Can consider repeat on or after 08/24/23.

## 2023-06-19 NOTE — Progress Notes (Signed)
Inflammatory markers look reassuring.

## 2023-07-05 ENCOUNTER — Encounter: Payer: Self-pay | Admitting: Cardiology

## 2023-07-12 NOTE — Progress Notes (Addendum)
Cardiology Office Note    Date:  07/13/2023   ID:  Keith Vazquez, DOB 15-Oct-1951, MRN [id number]  PCP:  Keith Vazquez, Keith Vazquez  Cardiologist:  Keith Vazquez, Keith Vazquez    History of Present Illness:  Keith Vazquez is a 72 y.o. male seen for follow up CAD, CHF, and Afib. He is status post afib ablation 05/05/14.   He has an ischemic CM with chronic systolic CHF.   He is s/p CABG in 2000 by Keith Vazquez. Cardiac caths in 2002 and 2015 showed atretic LIMA to the LAD which filled by the SVG to the second diagonal. The SVG to the second diagonal, SVG sequentially to the ramus and OM, and the SVG to PDA were all patent. Myoview study in April 2016 was felt to be low risk. He had an ETT in June 2017 with no ischemia at 13 METs. He also has a history of HTN.  In January of 2018 he was started on Entresto- reported intolerance of higher doses.  He states he could not tolerate aldactone due to severe depression and quit taking it.  Myoview in 2019 showed inferior scar without ischemia and EF 30%. Echo in May 2021 showed EF down to 25-30%. He was seen by Keith. Johney Vazquez. No mention of a ILR but he did discuss consideration of ICD. Not a candidate for CRT due to narrow QRS. 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   He has symptomatic atrial fibrillation that occur sporadically.   He has an Apple watch which he uses to track these episodes.  He  wore an event monitor in 2021 which confirmed he is having paroxysms of atrial fibrillation.  The burden was 2-3%. He was seen by Keith Keith Vazquez in 2021. His note indicates complete discussions about AAD therapy, anticoagulation, repeat ablation, and ICD. Patient did not want to consider ICD. Keith Keith Vazquez felt that with low Afib burden the best thing to do was to monitor for now unless Afib burden increases significantly.   Previously he noted more frequent episodes of afib. Ultimately we decided to resume amiodarone 400 mg daily  and Eliquis. He ended up stopping Eliquis due to concerns of bruising.    He reported  persistent dyspnea so tapered off Coreg completely and felt like this helped but he still felt some dyspnea with exertion. We repeated Echo in April and EF 30-35%. Encourage retrial of Jardiance but he did not want to take.  He states he did develop a tremor and he ended up resuming Coreg with improvement in this.  He states he quit taking Entresto because he couldn't see a change with it and his EF did not change.  Recently one week ago he had recurrent Afib. States this is the first episode he has had since on amiodarone. He had another episode a couple of nights ago. The first lasted 24 hours and the second 2 hours. He wonders if he is getting a Arts development officer for his amiodarone. He wonders if a diuretic would help his breathing.     Past Medical History:  Diagnosis Date   Atrial flutter (HCC)    CHF (congestive heart failure) (HCC)    LVEF at time of cath 2002 50%; now has been 35-40% since 2008 (patient has seen multiple cardiologists over the years including [facility], [place], [facility], [facility] and Keith Vazquez, Keith Vazquez)   Coronary artery disease  CABG x5 in 2000; s/p cath 2002 > 4/5 grafts patent, LVEF 50%   Degeneration of lumbar intervertebral disc    GERD (gastroesophageal reflux disease)    GI bleed    duodenal ulcer   Hypertension    Borderline   Ischemic heart disease    s/p MI, CABG 2000, s/p cath 2002 with 4/5 grafts patent (LIMA to LAD atretic and occluded mid vessel but adequate flow to distal LAD from collaterals / diagonal   Low back pain    PAC (premature atrial contraction)    PAF (paroxysmal atrial fibrillation) (HCC)    initially diagnosed during stress test 2002   PAF (paroxysmal atrial fibrillation) (HCC)    Peyronie's disease     Past Surgical History:  Procedure Laterality Date   ABLATION  05-06-2014   PVI and CTI ablation by Keith  Keith Vazquez   ATRIAL FIBRILLATION ABLATION N/A 05/06/2014   Procedure: ATRIAL FIBRILLATION ABLATION;  Surgeon: Keith Vazquez, Keith Vazquez;  Location: [facility] CATH LAB;  Service: Cardiovascular;  Laterality: N/A;   CAPSULOTOMY Right 06/15/2022   Procedure: CAPSULOTOMY TOES 2-4;  Surgeon: Keith Vazquez, DPM;  Location: [facility] ORS;  Service: Podiatry;  Laterality: Right;   CARDIAC CATHETERIZATION  09/14/2001    Mildly decreased left ventricular systolic function --  Native three vessel coronary artery disease as described. -- Status post coronary artery bypass grafting   CORONARY ARTERY BYPASS GRAFT  2000   LIMA to LAD, SVG to second diagonal, seq SVG to ramus and OM, SVG to PDA   HAMMER TOE SURGERY Right 06/15/2022   Procedure: HAMMER TOE CORRECTION TOES 2-4;  Surgeon: Keith Vazquez, DPM;  Location: [facility] ORS;  Service: Podiatry;  Laterality: Right;   HERNIA REPAIR  05/16/2001   Large left indirect inguinal hernia with right direct hernia.   HERNIA REPAIR     Recurrent left inguinal hernia -- Large left indirect inguinal hernia with right direct hernia.   LEFT HEART CATHETERIZATION WITH CORONARY/GRAFT ANGIOGRAM  11/26/2013   Procedure: LEFT HEART CATHETERIZATION WITH Isabel Caprice;  Surgeon: Keith Vazquez, Keith Vazquez;  Location: [facility] CATH LAB;  Service: Cardiovascular;;   TEE WITHOUT CARDIOVERSION N/A 05/06/2014   Procedure: TRANSESOPHAGEAL ECHOCARDIOGRAM (TEE);  Surgeon: Lars Masson, Keith Vazquez;  Location: [facility] ENDOSCOPY;  Service: Cardiovascular;  Laterality: N/A;    Current Medications: Outpatient Medications Prior to Visit  Medication Sig Dispense Refill   amiodarone (PACERONE) 200 MG tablet TAKE ONE TABLET BY MOUTH ONCE DAILY 90 tablet 3   amitriptyline (ELAVIL) 10 MG tablet Take 10 mg by mouth at bedtime.     Ascorbic Acid (VITAMIN C) 1000 MG tablet Take 1,000 mg by mouth daily.     cholecalciferol (VITAMIN D3) 25 MCG (1000 UNIT) tablet Take 1,000 Units by mouth daily.     tamsulosin (FLOMAX) 0.4 MG CAPS capsule  Take 0.4 mg by mouth daily.     vitamin E 200 UNIT capsule Take 200 Units by mouth daily.     celecoxib (CELEBREX) 200 MG capsule Take 200 mg by mouth 2 (two) times daily as needed. (Patient not taking: Reported on 07/13/2023)     No facility-administered medications prior to visit.     Allergies:   Patient has no known allergies.   Social History   Socioeconomic History   Marital status: Single    Spouse name: Not on file   Number of children: 0   Years of education: Not on file   Highest education level: Not on file  Occupational History   Occupation: garage Energy manager  Tobacco Use   Smoking status: Former    Current packs/day: 0.00    Average packs/day: 1 pack/day for 20.0 years (20.0 ttl pk-yrs)    Types: Cigarettes    Start date: 10/31/1974    Quit date: 10/31/1994    Years since quitting: 28.7   Smokeless tobacco: Never  Vaping Use   Vaping status: Never Used  Substance and Sexual Activity   Alcohol use: No    Alcohol/week: 0.0 standard drinks of alcohol   Drug use: No   Sexual activity: Yes  Other Topics Concern   Not on file  Social History Narrative   Lives in [place].  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  Also flies airplanes   Social Determinants of Health   Financial Resource Strain: Not on file  Food Insecurity: No Food Insecurity (12/14/2020)   [facility]   Hunger Vital Sign    Worried About Running Out of Food in the Last Year: Never true    Ran Out of Food in the Last Year: Never true  Transportation Needs: Not on file  Physical Activity: Not on file  Stress: No Stress Concern Present (02/03/2020)   [employer] of Occupational Health - Occupational Stress Questionnaire    Feeling of Stress : Only a little  Social Connections: Unknown (03/15/2022)   Received from [facility], [facility]   Social Network    Social Network: Not on file     Family History:  The patient's family history is  not on file. He was adopted.   ROS:   Please see the history of present illness.    ROS All other systems reviewed and are negative.   PHYSICAL EXAM:   VS:  BP 128/78 (BP Location: Left Arm, Patient Position: Sitting, Cuff Size: Normal)   Pulse 68   Ht 6\' 1"  (1.854 m)   Wt 191 lb 3.2 oz (86.7 kg)   SpO2 96%   BMI 25.23 kg/m    GEN: Well nourished, well developed, in no acute distress  HEENT: normal  Neck: no JVD, carotid bruits, or masses Cardiac: RRR; no murmurs, rubs, or gallops,no edema  Respiratory:  clear to auscultation bilaterally, normal work of breathing GI: soft, nontender, nondistended, + BS MS: no deformity or atrophy  Skin: warm and dry, no rash Neuro:  Alert and Oriented x 3, Strength and sensation are intact Psych: euthymic mood, full affect  Wt Readings from Last 3 Encounters:  07/13/23 191 lb 3.2 oz (86.7 kg)  05/31/23 190 lb (86.2 kg)  01/17/23 197 lb 6.4 oz (89.5 kg)      Studies/Labs Reviewed:   EKG:  EKG is not  ordered today.     Recent Labs: 10/14/2022: ALT 8; BUN 18; Creatinine, Ser 1.21; Potassium 4.3; Sodium 137; TSH 3.780   Lipid Panel    Component Value Date/Time   CHOL 183 04/01/2022 1147   TRIG 185 (H) 04/01/2022 1147   HDL 44 04/01/2022 1147   CHOLHDL 4.2 04/01/2022 1147   CHOLHDL 4 02/08/2021 1134   VLDL 38.0 02/08/2021 1134   LDLCALC 107 (H) 04/01/2022 1147   LDLDIRECT 120.5 09/30/2013 1400    Additional studies/ records that were reviewed today include:  ETT 04/21/16: Study Highlights   There was no ST segment deviation noted during stress.   Ambient PVC;s at rest, with stress and recovery No significant NSVT No ischemia at 13.2 METS and 102%  of PMHR   Echo: 04/21/16: Study Conclusions   - Left ventricle: The cavity size was normal. Wall thickness was   normal. Basal to mid inferior akinesis. Basal to mid   inferolateral akinesis. Anterolateral hypokinesis. Systolic   function was mildly to moderately reduced. The  estimated ejection   fraction was in the range of 40% to 45%. Doppler parameters are   consistent with abnormal left ventricular relaxation (grade 1   diastolic dysfunction). - Aortic valve: There was no stenosis. - Aorta: Mildly dilated aortic root. Aortic root dimension: 38 mm   (ED). - Mitral valve: Mildly calcified annulus. There was trivial   regurgitation. - Left atrium: The atrium was mildly dilated. - Right ventricle: The cavity size was normal. Systolic function   was mildly reduced. - Tricuspid valve: Peak RV-RA gradient (S): 16 mm Hg. - Pulmonary arteries: PA peak pressure: 19 mm Hg (S). - Inferior vena cava: The vessel was normal in size. The   respirophasic diameter changes were in the normal range (= 50%),   consistent with normal central venous pressure.   Impressions:   - Normal LV size with EF 40-45%. Wall motion abnormalities as noted   above. Normal RV size with mildly decreased systolic function. No   significant valvular abnormalities.  Myoview 11/10/17: Study Highlights    Nuclear stress EF: 30%. The left ventricular ejection fraction is moderately decreased (30-44%). Defect 1: There is a defect present in the basal inferolateral and mid inferolateral location. Findings consistent with prior myocardial infarction. This is a high risk study.   High risk stress nuclear study due to severely reduced global systolic function. There is evidence of an inferolateral scar (previously seen), but there is no reversible ischemia. There is global hypokinesis, worse in the inferolateral wall. LVEF has decreased slightly from 2016 (previously 36%), otherwise little change.   Echo 03/16/20: IMPRESSIONS     1. Global hypokinesis with akinesis of the inferobasal and inferolateral  walls; overall severe LV dysfunction; moderate LVE; mild LVH; grade 1  diastolic dysfunction; mildly dilated aortic root.   2. Left ventricular ejection fraction, by estimation, is 25 to 30%.  The  left ventricle has severely decreased function. The left ventricle  demonstrates regional wall motion abnormalities (see scoring  diagram/findings for description). The left  ventricular internal cavity size was moderately dilated. There is mild  left ventricular hypertrophy. Left ventricular diastolic parameters are  consistent with Grade I diastolic dysfunction (impaired relaxation).   3. Right ventricular systolic function is normal. The right ventricular  size is normal.   4. The mitral valve is normal in structure. Trivial mitral valve  regurgitation. No evidence of mitral stenosis.   5. The aortic valve is tricuspid. Aortic valve regurgitation is not  visualized. No aortic stenosis is present.   6. Aortic dilatation noted. There is mild dilatation of the aortic root  measuring 38 mm.   08/13/20: Event monitor: Study Highlights  Normal sinus rhythm Paroxysmal atrial fibrillation with rapid response and some aberrantion NSVT up to 5 beats   Echo 02/10/23: IMPRESSIONS     1. Left ventricular ejection fraction, by estimation, is 30 to 35%. The  left ventricle has moderately decreased function. The left ventricle  demonstrates global hypokinesis. The left ventricular internal cavity size  was mildly dilated. Left ventricular  diastolic parameters are consistent with Grade I diastolic dysfunction  (impaired relaxation). There is akinesis of the left ventricular, basal  inferior wall and inferolateral wall. There is hypokinesis of  the left  ventricular, mid-apical inferolateral  wall. The average left ventricular global longitudinal strain is -12.4 %.  The global longitudinal strain is abnormal.   2. Right ventricular systolic function is mildly reduced. The right  ventricular size is normal.   3. The mitral valve is normal in structure. Trivial mitral valve  regurgitation. No evidence of mitral stenosis.   4. The aortic valve is normal in structure. Aortic valve  regurgitation is  not visualized. No aortic stenosis is present.   5. The inferior vena cava is normal in size with greater than 50%  respiratory variability, suggesting right atrial pressure of 3 mmHg.   ASSESSMENT:    No diagnosis found.    PLAN:   1.  CAD: s/p CABG 2000. Known atretic LIMA to LAD but other grafts patent by cath in 2015. Prior Myoview studies have been abnormal so I don't think would be very helpful to assess circulation now.  - he has class 1 angina.   - not on ASA but willing to try Eliquis again.  - if he needs ischemic evaluation I think cardiac cath would be the only useful way to evaluate  2.  Chronic systolic CHF: EF 78-29% - back on Coreg which has helped tremor.  - intolerant of aldactone 25 mg daily due to depression - Jardiance in past  expensive.  - quit taking Entresto because he didn't see any benefit. Explained that studies indicate a lower mortality and risk for worsening CHF with this therapy.  - refused ICD - will try lasix 20 mg daily to see if dyspnea improves.  - will update labs    3.  Hypertension: -BP is well controlled.    4.  Hyperlipidemia: -He is not on a statin, but is taking coenzyme Q 10 and niacin. -Target LDL is less than 70 - we discussed results of lipid panel. He is not interested in taking lipid lowering therapy. - will update labs   5.  Paroxysmal Afib -some recent breakthrough but has done very well on amiodarone. Will increase dose to 300 mg daily - NSR today - [place] vasc score of 4- patient stopped taking Eliquis on his own due to concern about bruising. He is willing to try again. Will start 5 mg bid.  -  I will attempt to get old OP note from CABG in 2000 to see if he had LA ligation.    Addendum: I did receive a copy of his OP note from his CABG and he DID NOT have LA appendage closure at the time of surgery   Keith Dolder Vazquez, Keith Vazquez ,[facility] 07/13/2023 2:21 PM    [facility] [address]  [place], [place], [address] [phone]

## 2023-07-13 ENCOUNTER — Ambulatory Visit: Payer: Medicare Other | Attending: Cardiology | Admitting: Cardiology

## 2023-07-13 ENCOUNTER — Encounter: Payer: Self-pay | Admitting: Cardiology

## 2023-07-13 VITALS — BP 128/78 | HR 68 | Ht 73.0 in | Wt 191.2 lb

## 2023-07-13 DIAGNOSIS — E785 Hyperlipidemia, unspecified: Secondary | ICD-10-CM | POA: Insufficient documentation

## 2023-07-13 DIAGNOSIS — I10 Essential (primary) hypertension: Secondary | ICD-10-CM | POA: Diagnosis present

## 2023-07-13 DIAGNOSIS — I480 Paroxysmal atrial fibrillation: Secondary | ICD-10-CM | POA: Insufficient documentation

## 2023-07-13 DIAGNOSIS — I2510 Atherosclerotic heart disease of native coronary artery without angina pectoris: Secondary | ICD-10-CM | POA: Diagnosis not present

## 2023-07-13 DIAGNOSIS — I5022 Chronic systolic (congestive) heart failure: Secondary | ICD-10-CM | POA: Diagnosis present

## 2023-07-13 MED ORDER — APIXABAN 5 MG PO TABS: 5.0000 mg | ORAL_TABLET | Freq: Two times a day (BID) | ORAL | 3 refills | Status: DC

## 2023-07-13 MED ORDER — AMIODARONE HCL 200 MG PO TABS: 300.0000 mg | ORAL_TABLET | Freq: Every day | ORAL | 3 refills | Status: DC

## 2023-07-13 MED ORDER — CARVEDILOL 12.5 MG PO TABS: 12.5000 mg | ORAL_TABLET | Freq: Two times a day (BID) | ORAL | 3 refills | Status: DC

## 2023-07-13 MED ORDER — FUROSEMIDE 20 MG PO TABS: 20.0000 mg | ORAL_TABLET | Freq: Every day | ORAL | 3 refills | Status: DC

## 2023-07-13 NOTE — Patient Instructions (Addendum)
Medication Instructions:  Increase Amiodarone to 300 mg daily   Start Lasix 20 mg daily Make sure you are taking Carvedilol 12.5 mg twice a day Start Eliquis 5 mg twice a day Continue all other medications *If you need a refill on your cardiac medications before your next appointment, please call your pharmacy*   Lab Work: Cmet,cbc,lipid panel,tsh today   Testing/Procedures: None ordered   Follow-Up: At [facility], you and your health needs are our priority.  As part of our continuing mission to provide you with exceptional heart care, we have created designated Provider Care Teams.  These Care Teams include your primary Cardiologist (physician) and Advanced Practice Providers (APPs -  Physician Assistants and Nurse Practitioners) who all work together to provide you with the care you need, when you need it.  We recommend signing up for the patient portal called "MyChart".  Sign up information is provided on this After Visit Summary.  MyChart is used to connect with patients for Virtual Visits (Telemedicine).  Patients are able to view lab/test results, encounter notes, upcoming appointments, etc.  Non-urgent messages can be sent to your provider as well.   To learn more about what you can do with MyChart, go to [email or web address].    Your next appointment:  3 months   Monday 12/16 at 4:20 pm    Provider:  Dr.Jordan

## 2023-07-14 LAB — LIPID PANEL
Chol/HDL Ratio: 3.3 ratio (ref 0.0–5.0)
Cholesterol, Total: 163 mg/dL (ref 100–199)
HDL: 49 mg/dL (ref >39)
LDL Chol Calc (NIH): 79 mg/dL (ref 0–99)
Triglycerides: 207 mg/dL — ABNORMAL HIGH (ref 0–149)
VLDL Cholesterol Cal: 35 mg/dL (ref 5–40)

## 2023-07-14 LAB — COMPREHENSIVE METABOLIC PANEL
ALT: 11 IU/L (ref 0–44)
AST: 23 IU/L (ref 0–40)
Albumin: 4.4 g/dL (ref 3.8–4.8)
Alkaline Phosphatase: 114 IU/L (ref 44–121)
BUN/Creatinine Ratio: 23 (ref 10–24)
BUN: 27 mg/dL (ref 8–27)
Bilirubin Total: 0.6 mg/dL (ref 0.0–1.2)
CO2: 25 mmol/L (ref 20–29)
Calcium: 9.6 mg/dL (ref 8.6–10.2)
Chloride: 100 mmol/L (ref 96–106)
Creatinine, Ser: 1.17 mg/dL (ref 0.76–1.27)
Globulin, Total: 2.6 g/dL (ref 1.5–4.5)
Glucose: 96 mg/dL (ref 70–99)
Potassium: 4.6 mmol/L (ref 3.5–5.2)
Sodium: 138 mmol/L (ref 134–144)
Total Protein: 7 g/dL (ref 6.0–8.5)
eGFR: 65 mL/min/{1.73_m2} (ref >59)

## 2023-07-14 LAB — CBC WITH DIFFERENTIAL/PLATELET
Basophils Absolute: 0 10*3/uL (ref 0.0–0.2)
Basos: 0 %
EOS (ABSOLUTE): 0.1 10*3/uL (ref 0.0–0.4)
Eos: 2 %
Hematocrit: 46.9 % (ref 37.5–51.0)
Hemoglobin: 15.4 g/dL (ref 13.0–17.7)
Immature Grans (Abs): 0 10*3/uL (ref 0.0–0.1)
Immature Granulocytes: 0 %
Lymphocytes Absolute: 1.2 10*3/uL (ref 0.7–3.1)
Lymphs: 16 %
MCH: 30 pg (ref 26.6–33.0)
MCHC: 32.8 g/dL (ref 31.5–35.7)
MCV: 91 fL (ref 79–97)
Monocytes Absolute: 0.6 10*3/uL (ref 0.1–0.9)
Monocytes: 8 %
Neutrophils Absolute: 5.6 10*3/uL (ref 1.4–7.0)
Neutrophils: 74 %
Platelets: 187 10*3/uL (ref 150–450)
RBC: 5.13 x10E6/uL (ref 4.14–5.80)
RDW: 12.5 % (ref 11.6–15.4)
WBC: 7.6 10*3/uL (ref 3.4–10.8)

## 2023-07-14 LAB — TSH: TSH: <0.005 u[IU]/mL — ABNORMAL LOW (ref 0.450–4.500)

## 2023-07-17 ENCOUNTER — Other Ambulatory Visit: Payer: Self-pay

## 2023-07-17 DIAGNOSIS — R7989 Other specified abnormal findings of blood chemistry: Secondary | ICD-10-CM

## 2023-07-17 MED ORDER — AMIODARONE HCL 200 MG PO TABS: 300.0000 mg | ORAL_TABLET | Freq: Every day | ORAL | 3 refills | Status: DC

## 2023-07-17 MED ORDER — FUROSEMIDE 20 MG PO TABS: 20.0000 mg | ORAL_TABLET | Freq: Every day | ORAL | 3 refills | Status: DC

## 2023-08-03 ENCOUNTER — Encounter: Payer: Self-pay | Admitting: Cardiology

## 2023-08-04 ENCOUNTER — Other Ambulatory Visit: Payer: Self-pay

## 2023-08-04 ENCOUNTER — Other Ambulatory Visit: Payer: Self-pay | Admitting: Cardiology

## 2023-08-04 MED ORDER — AMIODARONE HCL 200 MG PO TABS: ORAL_TABLET | ORAL | 3 refills | Status: DC

## 2023-08-04 NOTE — Addendum Note (Signed)
Addended by: Scheryl Marten on: 08/04/2023 04:32 PM   Modules accepted: Orders

## 2023-08-15 NOTE — Telephone Encounter (Signed)
VOB initiated for ZILRETTA for RIGHT knee OA.

## 2023-08-22 NOTE — Telephone Encounter (Signed)
ZILRETTA for RIGHT knee OA OK to schedule on or after 08/24/23  Primary Insurance: Medicare Co-Pay: n/a Co-Insurance: 20% Deductible: $240 of $240 met  Prior Authorization: NOT required  Secondary Insurance: BCBS [place] Medicare Supplement Co-Pay: n/a Co-Insurance: covers 20% Medicare co-insurance Deductible: covers Medicare deductible  Prior Authorization: NOT required

## 2023-08-22 NOTE — Telephone Encounter (Signed)
NO Prior Auth required for Zilretta  Primary: Medicare     Secondary: BCBS Medicare Supplement

## 2023-08-22 NOTE — Telephone Encounter (Signed)
Holding until stock comes in.

## 2023-08-25 NOTE — Telephone Encounter (Signed)
Not needed per patient. He said that his knee is still feeling good.

## 2023-08-25 NOTE — Telephone Encounter (Signed)
Left message for patient to call back to schedule.  °

## 2023-09-04 ENCOUNTER — Ambulatory Visit (INDEPENDENT_AMBULATORY_CARE_PROVIDER_SITE_OTHER): Payer: Medicare Other | Admitting: Diagnostic Neuroimaging

## 2023-09-04 ENCOUNTER — Encounter: Payer: Self-pay | Admitting: Diagnostic Neuroimaging

## 2023-09-04 VITALS — BP 114/67 | HR 60 | Ht 73.0 in | Wt 191.0 lb

## 2023-09-04 DIAGNOSIS — R0609 Other forms of dyspnea: Secondary | ICD-10-CM

## 2023-09-04 DIAGNOSIS — R0602 Shortness of breath: Secondary | ICD-10-CM | POA: Diagnosis not present

## 2023-09-04 DIAGNOSIS — R531 Weakness: Secondary | ICD-10-CM

## 2023-09-04 NOTE — Patient Instructions (Signed)
  dyspnea on exertion (likely related to CHF, low EF, age) - check labs to rule out neuromuscular causes - continue exercise program; consider personal trainer or cardiac rehab program  minimal postural tremor (likely mild essential tremor) - better controlled on carvedilol

## 2023-09-04 NOTE — Progress Notes (Unsigned)
[facility]  PATIENT: Keith Vazquez DOB: 1951-03-20  REFERRING CLINICIAN: Lavada Mesi, MD HISTORY FROM: patient  REASON FOR VISIT: new consult   HISTORICAL  CHIEF COMPLAINT:  Chief Complaint  Patient presents with   Migraine    Rm 7 alone Pt is well, reports he is not here for migraines and tremors. Pt reports his main concern is he is short winded, especially when exercising.  He also reports he has had headaches all his life. He has 4-6 headaches a month that last several days.  The bilateral hand tremors and occasional R sided facial twitching started when he stopped carvedilol at one point, he had to restart.     HISTORY OF PRESENT ILLNESS:   72 year old male with history of coronary artery disease, CHF, atrial flutter, hypertension, lumbar degenerative spine disease, here for evaluation of dyspnea on exertion and exercise intolerance.  Symptoms have been going on for at least 10 years.  Patient has noted that more recently when he walks a flight of steps he feels short of breath.  He also notices when he is exercising on a treadmill or other gym cardio machines.  He notices that he is not able to do these exercises at the same output for the same relative exertion as previously.  He has been trying to build up his strength and stamina with exercises but has not been successful with this.  Also noted to have some mild postural tremor.  Also with some migraine headaches currently on Emgality and sumatriptan.    REVIEW OF SYSTEMS: Full 14 system review of systems performed and negative with exception of: as per HPI.  ALLERGIES: No Known Allergies  HOME MEDICATIONS: Outpatient Medications Prior to Visit  Medication Sig Dispense Refill   amiodarone (PACERONE) 200 MG tablet Take 2 tablets ( 400 mg ) daily 180 tablet 3   amitriptyline (ELAVIL) 10 MG tablet Take 10 mg by mouth at bedtime.     apixaban (ELIQUIS) 5 MG TABS tablet Take 1 tablet (5 mg  total) by mouth 2 (two) times daily. 180 tablet 3   Ascorbic Acid (VITAMIN C) 1000 MG tablet Take 1,000 mg by mouth daily.     carvedilol (COREG) 12.5 MG tablet Take 1 tablet (12.5 mg total) by mouth 2 (two) times daily. 180 tablet 3   cholecalciferol (VITAMIN D3) 25 MCG (1000 UNIT) tablet Take 1,000 Units by mouth daily.     dicyclomine (BENTYL) 10 MG capsule Take 10 mg by mouth 4 (four) times daily as needed.     EMGALITY 120 MG/ML SOAJ inject 1 pen injector subcutaneously once a month     furosemide (LASIX) 20 MG tablet Take 1 tablet (20 mg total) by mouth daily. 90 tablet 3   SUMAtriptan (IMITREX) 100 MG tablet Take 100 mg by mouth as needed.     tamsulosin (FLOMAX) 0.4 MG CAPS capsule Take 0.4 mg by mouth daily.     triamterene-hydrochlorothiazide (MAXZIDE-25) 37.5-25 MG tablet Take by mouth daily.     valsartan (DIOVAN) 40 MG tablet Take 40 mg by mouth daily.     vitamin E 200 UNIT capsule Take 200 Units by mouth daily.     No facility-administered medications prior to visit.    PAST MEDICAL HISTORY: Past Medical History:  Diagnosis Date   Atrial flutter (HCC)    CHF (congestive heart failure) (HCC)    LVEF at time of cath 2002 50%; now has been 35-40% since 2008 (patient has seen multiple  cardiologists over the years including [facility], [place], [facility], [facility] and Viann Fish, MD)   Coronary artery disease    CABG x5 in 2000; s/p cath 2002 > 4/5 grafts patent, LVEF 50%   Degeneration of lumbar intervertebral disc    GERD (gastroesophageal reflux disease)    GI bleed    duodenal ulcer   Hypertension    Borderline   Ischemic heart disease    s/p MI, CABG 2000, s/p cath 2002 with 4/5 grafts patent (LIMA to LAD atretic and occluded mid vessel but adequate flow to distal LAD from collaterals / diagonal   Low back pain    PAC (premature atrial contraction)    PAF (paroxysmal atrial fibrillation) (HCC)    initially diagnosed during stress  test 2002   PAF (paroxysmal atrial fibrillation) (HCC)    Peyronie's disease     PAST SURGICAL HISTORY: Past Surgical History:  Procedure Laterality Date   ABLATION  05-06-2014   PVI and CTI ablation by Dr Johney Frame   ATRIAL FIBRILLATION ABLATION N/A 05/06/2014   Procedure: ATRIAL FIBRILLATION ABLATION;  Surgeon: Gardiner Rhyme, MD;  Location: [facility] CATH LAB;  Service: Cardiovascular;  Laterality: N/A;   CAPSULOTOMY Right 06/15/2022   Procedure: CAPSULOTOMY TOES 2-4;  Surgeon: Felecia Shelling, DPM;  Location: [facility] ORS;  Service: Podiatry;  Laterality: Right;   CARDIAC CATHETERIZATION  09/14/2001    Mildly decreased left ventricular systolic function --  Native three vessel coronary artery disease as described. -- Status post coronary artery bypass grafting   CORONARY ARTERY BYPASS GRAFT  2000   LIMA to LAD, SVG to second diagonal, seq SVG to ramus and OM, SVG to PDA   HAMMER TOE SURGERY Right 06/15/2022   Procedure: HAMMER TOE CORRECTION TOES 2-4;  Surgeon: Felecia Shelling, DPM;  Location: [facility] ORS;  Service: Podiatry;  Laterality: Right;   HERNIA REPAIR  05/16/2001   Large left indirect inguinal hernia with right direct hernia.   HERNIA REPAIR     Recurrent left inguinal hernia -- Large left indirect inguinal hernia with right direct hernia.   LEFT HEART CATHETERIZATION WITH CORONARY/GRAFT ANGIOGRAM  11/26/2013   Procedure: LEFT HEART CATHETERIZATION WITH Isabel Caprice;  Surgeon: Peter M Swaziland, MD;  Location: [facility] CATH LAB;  Service: Cardiovascular;;   TEE WITHOUT CARDIOVERSION N/A 05/06/2014   Procedure: TRANSESOPHAGEAL ECHOCARDIOGRAM (TEE);  Surgeon: Lars Masson, MD;  Location: [facility] ENDOSCOPY;  Service: Cardiovascular;  Laterality: N/A;    FAMILY HISTORY: Family History  Adopted: Yes    SOCIAL HISTORY: Social History   Socioeconomic History   Marital status: Single    Spouse name: Not on file   Number of children: 0   Years of education: Not on file   Highest education level:  Not on file  Occupational History   Occupation: garage owner/corporate pilot  Tobacco Use   Smoking status: Former    Current packs/day: 0.00    Average packs/day: 1 pack/day for 20.0 years (20.0 ttl pk-yrs)    Types: Cigarettes    Start date: 10/31/1974    Quit date: 10/31/1994    Years since quitting: 28.8   Smokeless tobacco: Never  Vaping Use   Vaping status: Never Used  Substance and Sexual Activity   Alcohol use: No    Alcohol/week: 0.0 standard drinks of alcohol   Drug use: No   Sexual activity: Yes  Other Topics Concern   Not on file  Social History Narrative   Lives in  .  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  Also flies airplanes   Social Determinants of Health   Financial Resource Strain: Not on file  Food Insecurity: No Food Insecurity (12/14/2020)   Received from [facility], [facility]   Hunger Vital Sign    Worried About Running Out of Food in the Last Year: Never true    Ran Out of Food in the Last Year: Never true  Transportation Needs: Not on file  Physical Activity: Not on file  Stress: No Stress Concern Present (02/03/2020)   [employer] of Occupational Health - Occupational Stress Questionnaire    Feeling of Stress : Only a little  Social Connections: Unknown (03/15/2022)   Received from [facility], [facility]   Social Network    Social Network: Not on file  Intimate Partner Violence: Unknown (02/04/2022)   Received from [facility], [facility]   HITS    Physically Hurt: Not on file    Insult or Talk Down To: Not on file    Threaten Physical Harm: Not on file    Scream or Curse: Not on file     PHYSICAL EXAM  GENERAL EXAM/CONSTITUTIONAL: Vitals:  Vitals:   09/04/23 1106  BP: 114/67  Pulse: 60  Weight: 191 lb (86.6 kg)  Height: 6\' 1"  (1.854 m)   Body mass index is 25.2 kg/m. Wt Readings from Last 3 Encounters:  09/04/23 191 lb (86.6 kg)  07/13/23 191 lb 3.2 oz (86.7 kg)  05/31/23 190 lb (86.2 kg)    Patient is in no distress; well developed, nourished and groomed; neck is supple  CARDIOVASCULAR: Examination of carotid arteries is normal; no carotid bruits Regular rate and rhythm, no murmurs Examination of peripheral vascular system by observation and palpation is normal  EYES: Ophthalmoscopic exam of optic discs and posterior segments is normal; no papilledema or hemorrhages No results found.  MUSCULOSKELETAL: Gait, strength, tone, movements noted in Neurologic exam below  NEUROLOGIC: MENTAL STATUS:      No data to display         awake, alert, oriented to person, place and time recent and remote memory intact normal attention and concentration language fluent, comprehension intact, naming intact fund of knowledge appropriate  CRANIAL NERVE:  2nd - no papilledema on fundoscopic exam 2nd, 3rd, 4th, 6th - pupils equal and reactive to light, visual fields full to confrontation, extraocular muscles intact, no nystagmus 5th - facial sensation symmetric 7th - facial strength symmetric 8th - hearing intact 9th - palate elevates symmetrically, uvula midline 11th - shoulder shrug symmetric 12th - tongue protrusion midline  MOTOR:  normal bulk and tone, full strength in the BUE, BLE  SENSORY:  normal and symmetric to light touch, temperature, vibration  COORDINATION:  finger-nose-finger, fine finger movements normal  REFLEXES:  deep tendon reflexes 1+ and symmetric  GAIT/STATION:  narrow based gait     DIAGNOSTIC DATA (LABS, IMAGING, TESTING) - I reviewed patient records, labs, notes, testing and imaging myself where available.  Lab Results  Component Value Date   WBC 7.6 07/13/2023   HGB 15.4 07/13/2023   HCT 46.9 07/13/2023   MCV 91 07/13/2023   PLT 187 07/13/2023      Component Value Date/Time   NA 138 07/13/2023 1433   K 4.6 07/13/2023 1433   CL 100 07/13/2023 1433   CO2 25 07/13/2023 1433   GLUCOSE 96 07/13/2023 1433   GLUCOSE 94  05/31/2022 1144   BUN 27 07/13/2023 1433  CREATININE 1.17 07/13/2023 1433   CREATININE 1.05 04/01/2016 1247   CALCIUM 9.6 07/13/2023 1433   PROT 7.0 07/13/2023 1433   ALBUMIN 4.4 07/13/2023 1433   AST 23 07/13/2023 1433   ALT 11 07/13/2023 1433   ALKPHOS 114 07/13/2023 1433   BILITOT 0.6 07/13/2023 1433   GFRNONAA >60 05/31/2022 1144   GFRAA >60 04/24/2018 0301   Lab Results  Component Value Date   CHOL 163 07/13/2023   HDL 49 07/13/2023   LDLCALC 79 07/13/2023   LDLDIRECT 120.5 09/30/2013   TRIG 207 (H) 07/13/2023   CHOLHDL 3.3 07/13/2023   No results found for: "HGBA1C" Lab Results  Component Value Date   VITAMINB12 610 05/28/2018   Lab Results  Component Value Date   TSH <0.005 (L) 07/13/2023    02/10/23 TTE - Left ventricular ejection fraction, by estimation, is 30 to 35%. The  left ventricle has moderately decreased function. The left ventricle  demonstrates global hypokinesis.     ASSESSMENT AND PLAN  72 y.o. year old male here with:  Dx:  1. DOE (dyspnea on exertion)   2. SOB (shortness of breath)   3. Weakness     PLAN:  dyspnea on exertion (likely related to CHF, low EF, age related changes) - check labs to rule out neuromuscular causes - continue exercise program; consider personal trainer or cardiac rehab program  minimal postural tremor (likely mild essential tremor) - better controlled on carvedilol   migraine  - currently on emgality + sumatriptan (would have caution with triptans due to CAD history)  Orders Placed This Encounter  Procedures   AChR Abs with Reflex to MuSK   CK   Aldolase   Return for pending if symptoms worsen or fail to improve, pending test results.    [name], MD 09/04/2023, 11:57 AM Certified in Neurology, Neurophysiology and Neuroimaging  [facility] [address] [place], [place] [address] [phone]

## 2023-09-07 ENCOUNTER — Encounter: Payer: Self-pay | Admitting: Cardiology

## 2023-09-07 ENCOUNTER — Encounter: Payer: Self-pay | Admitting: Internal Medicine

## 2023-09-08 ENCOUNTER — Other Ambulatory Visit: Payer: Self-pay

## 2023-09-08 MED ORDER — SACUBITRIL-VALSARTAN 24-26 MG PO TABS: 1.0000 | ORAL_TABLET | Freq: Two times a day (BID) | ORAL | 3 refills | Status: AC

## 2023-09-20 LAB — ALDOLASE: Aldolase: 5.4 U/L (ref 3.3–10.3)

## 2023-09-20 LAB — CK: Total CK: 69 U/L (ref 41–331)

## 2023-09-20 LAB — MUSK ANTIBODIES: MuSK Antibodies: <1 U/mL

## 2023-09-20 LAB — ACHR ABS WITH REFLEX TO MUSK: AChR Binding Ab, Serum: <0.03 nmol/L (ref 0.00–0.24)

## 2023-09-26 ENCOUNTER — Ambulatory Visit (INDEPENDENT_AMBULATORY_CARE_PROVIDER_SITE_OTHER): Payer: Medicare Other | Admitting: Internal Medicine

## 2023-09-26 ENCOUNTER — Encounter: Payer: Self-pay | Admitting: Internal Medicine

## 2023-09-26 VITALS — BP 142/78 | HR 74 | Temp 97.7°F | Resp 16 | Ht 73.0 in | Wt 197.6 lb

## 2023-09-26 DIAGNOSIS — Z0000 Encounter for general adult medical examination without abnormal findings: Secondary | ICD-10-CM

## 2023-09-26 DIAGNOSIS — I5022 Chronic systolic (congestive) heart failure: Secondary | ICD-10-CM

## 2023-09-26 DIAGNOSIS — R053 Chronic cough: Secondary | ICD-10-CM

## 2023-09-26 DIAGNOSIS — N401 Enlarged prostate with lower urinary tract symptoms: Secondary | ICD-10-CM | POA: Diagnosis not present

## 2023-09-26 DIAGNOSIS — R0989 Other specified symptoms and signs involving the circulatory and respiratory systems: Secondary | ICD-10-CM

## 2023-09-26 DIAGNOSIS — I2510 Atherosclerotic heart disease of native coronary artery without angina pectoris: Secondary | ICD-10-CM | POA: Diagnosis not present

## 2023-09-26 DIAGNOSIS — Z1159 Encounter for screening for other viral diseases: Secondary | ICD-10-CM

## 2023-09-26 DIAGNOSIS — R0602 Shortness of breath: Secondary | ICD-10-CM | POA: Diagnosis not present

## 2023-09-26 DIAGNOSIS — R634 Abnormal weight loss: Secondary | ICD-10-CM | POA: Insufficient documentation

## 2023-09-26 DIAGNOSIS — I10 Essential (primary) hypertension: Secondary | ICD-10-CM | POA: Diagnosis not present

## 2023-09-26 DIAGNOSIS — R7989 Other specified abnormal findings of blood chemistry: Secondary | ICD-10-CM

## 2023-09-26 DIAGNOSIS — J300 Vasomotor rhinitis: Secondary | ICD-10-CM | POA: Insufficient documentation

## 2023-09-26 DIAGNOSIS — Z0001 Encounter for general adult medical examination with abnormal findings: Secondary | ICD-10-CM

## 2023-09-26 DIAGNOSIS — Z23 Encounter for immunization: Secondary | ICD-10-CM

## 2023-09-26 LAB — URINALYSIS, ROUTINE W REFLEX MICROSCOPIC
Bilirubin Urine: NEGATIVE
Hgb urine dipstick: NEGATIVE
Ketones, ur: NEGATIVE
Leukocytes,Ua: NEGATIVE
Nitrite: NEGATIVE
Specific Gravity, Urine: 1.025 (ref 1.000–1.030)
Urine Glucose: NEGATIVE
Urobilinogen, UA: 1 (ref 0.0–1.0)
pH: 6.5 (ref 5.0–8.0)

## 2023-09-26 LAB — CBC WITH DIFFERENTIAL/PLATELET
Basophils Absolute: 0 10*3/uL (ref 0.0–0.1)
Basophils Relative: 0.4 % (ref 0.0–3.0)
Eosinophils Absolute: 0.1 10*3/uL (ref 0.0–0.7)
Eosinophils Relative: 2.1 % (ref 0.0–5.0)
HCT: 46 % (ref 39.0–52.0)
Hemoglobin: 15.2 g/dL (ref 13.0–17.0)
Lymphocytes Relative: 12.5 % (ref 12.0–46.0)
Lymphs Abs: 0.8 10*3/uL (ref 0.7–4.0)
MCHC: 33.2 g/dL (ref 30.0–36.0)
MCV: 93.6 fL (ref 78.0–100.0)
Monocytes Absolute: 0.5 10*3/uL (ref 0.1–1.0)
Monocytes Relative: 7.8 % (ref 3.0–12.0)
Neutro Abs: 5.1 10*3/uL (ref 1.4–7.7)
Neutrophils Relative %: 77.2 % — ABNORMAL HIGH (ref 43.0–77.0)
Platelets: 186 10*3/uL (ref 150.0–400.0)
RBC: 4.91 Mil/uL (ref 4.22–5.81)
RDW: 15.6 % — ABNORMAL HIGH (ref 11.5–15.5)
WBC: 6.6 10*3/uL (ref 4.0–10.5)

## 2023-09-26 LAB — BASIC METABOLIC PANEL
BUN: 21 mg/dL (ref 6–23)
CO2: 28 meq/L (ref 19–32)
Calcium: 9.5 mg/dL (ref 8.4–10.5)
Chloride: 106 meq/L (ref 96–112)
Creatinine, Ser: 1.19 mg/dL (ref 0.40–1.50)
GFR: 60.09 mL/min (ref >60.00)
Glucose, Bld: 88 mg/dL (ref 70–99)
Potassium: 4.9 meq/L (ref 3.5–5.1)
Sodium: 139 meq/L (ref 135–145)

## 2023-09-26 LAB — PSA: PSA: 1.05 ng/mL (ref 0.10–4.00)

## 2023-09-26 LAB — BRAIN NATRIURETIC PEPTIDE: Pro B Natriuretic peptide (BNP): 212 pg/mL — ABNORMAL HIGH (ref 0.0–100.0)

## 2023-09-26 LAB — TROPONIN I (HIGH SENSITIVITY): High Sens Troponin I: 7 ng/L (ref 2–17)

## 2023-09-26 MED ORDER — AZELASTINE HCL 0.1 % NA SOLN: 1.0000 | Freq: Two times a day (BID) | NASAL | 1 refills | Status: DC

## 2023-09-26 NOTE — Progress Notes (Unsigned)
Subjective:  Patient ID: Keith Vazquez, male    DOB: [birth year]  Age: 72 y.o. MRN: [id number]  CC: Annual Exam, Coronary Artery Disease, Back Pain, and Congestive Heart Failure   HPI JEVAUGHN JEANPHILIPPE presents for a CPX and f/up ---  Discussed the use of AI scribe software for clinical note transcription with the patient, who gave verbal consent to proceed.  History of Present Illness   The patient, with a history of congestive heart failure (CHF) and low ejection fraction, presents with a chief complaint of dyspnea, described as 'getting easily winded.' This symptom is particularly noticeable when climbing stairs or after showering, despite regular cardio exercise at the gym. The patient denies chest pain or wheezing.  The patient has been on carvedilol, initially at a low dose, which was later increased to 20mg  daily. He is uncertain about the effectiveness of this increased dosage. A diuretic was introduced approximately 4-5 months ago due to the CHF diagnosis, but the patient believes it worsened his breathing and caused weight loss and low energy. The diuretic was subsequently discontinued, leading to weight regain and improved energy levels.  The patient has also been on amiodarone for atrial fibrillation (AFib) for several years without any episodes of AFib. However, a recent change in the brand of amiodarone led to a return of AFib symptoms. After reverting to the original brand, the AFib symptoms resolved within a few weeks.  The patient also reports chronic back pain, which has been particularly severe over the past week. He has tried various treatments, including Celebrex and nerve burning procedures, with limited success.  The patient denies any issues with urination and reports no leg swelling. He has been experiencing fatigue for most of his adult life, which he describes as low energy. This symptom seemed to improve after discontinuing the diuretic.       History Dorr has  a past medical history of Atrial flutter (HCC), CHF (congestive heart failure) (HCC), Coronary artery disease, Degeneration of lumbar intervertebral disc, GERD (gastroesophageal reflux disease), GI bleed, Hypertension, Ischemic heart disease, Low back pain, PAC (premature atrial contraction), PAF (paroxysmal atrial fibrillation) (HCC), PAF (paroxysmal atrial fibrillation) (HCC), and Peyronie's disease.   He has a past surgical history that includes Cardiac catheterization (09/14/2001); Hernia repair (05/16/2001); Hernia repair; Coronary artery bypass graft (2000); Ablation (05-06-2014); TEE without cardioversion (N/A, 05/06/2014); left heart catheterization with coronary/graft angiogram (11/26/2013); atrial fibrillation ablation (N/A, 05/06/2014); Hammer toe surgery (Right, 06/15/2022); and Capsulotomy (Right, 06/15/2022).   His family history is not on file. He was adopted.He reports that he quit smoking about 28 years ago. His smoking use included cigarettes. He started smoking about 48 years ago. He has a 20 pack-year smoking history. He has never used smokeless tobacco. He reports that he does not drink alcohol and does not use drugs.  Outpatient Medications Prior to Visit  Medication Sig Dispense Refill   amiodarone (PACERONE) 200 MG tablet Take 2 tablets ( 400 mg ) daily 180 tablet 3   amitriptyline (ELAVIL) 10 MG tablet Take 10 mg by mouth at bedtime.     Ascorbic Acid (VITAMIN C) 1000 MG tablet Take 1,000 mg by mouth daily.     carvedilol (COREG) 12.5 MG tablet Take 1 tablet (12.5 mg total) by mouth 2 (two) times daily. 180 tablet 3   cholecalciferol (VITAMIN D3) 25 MCG (1000 UNIT) tablet Take 1,000 Units by mouth daily.     dicyclomine (BENTYL) 10 MG capsule Take 10 mg by mouth  4 (four) times daily as needed.     NURTEC 75 MG TBDP Take 75 mg by mouth as needed (The onset of a migraines).     sacubitril-valsartan (ENTRESTO) 24-26 MG Take 1 tablet by mouth 2 (two) times daily. 180 tablet 3   tamsulosin  (FLOMAX) 0.4 MG CAPS capsule Take 0.4 mg by mouth daily.     vitamin E 200 UNIT capsule Take 200 Units by mouth daily.     furosemide (LASIX) 20 MG tablet Take 1 tablet (20 mg total) by mouth daily. (Patient not taking: Reported on 09/26/2023) 90 tablet 3   triamterene-hydrochlorothiazide (MAXZIDE-25) 37.5-25 MG tablet Take by mouth daily. (Patient not taking: Reported on 09/26/2023)     valsartan (DIOVAN) 40 MG tablet Take 40 mg by mouth daily. (Patient not taking: Reported on 09/26/2023)     apixaban (ELIQUIS) 5 MG TABS tablet Take 1 tablet (5 mg total) by mouth 2 (two) times daily. 180 tablet 3   EMGALITY 120 MG/ML SOAJ inject 1 pen injector subcutaneously once a month     SUMAtriptan (IMITREX) 100 MG tablet Take 100 mg by mouth as needed.     No facility-administered medications prior to visit.    ROS Review of Systems  Constitutional:  Positive for fatigue. Negative for appetite change, chills, diaphoresis, fever and unexpected weight change.  HENT:  Positive for congestion, postnasal drip and rhinorrhea. Negative for nosebleeds, sinus pressure, sore throat and trouble swallowing.   Eyes:  Negative for visual disturbance.  Respiratory:  Positive for cough and shortness of breath. Negative for choking, chest tightness, wheezing and stridor.   Cardiovascular:  Negative for chest pain, palpitations and leg swelling.  Gastrointestinal: Negative.  Negative for abdominal pain, constipation, diarrhea, nausea and vomiting.  Genitourinary: Negative.  Negative for difficulty urinating, dysuria, flank pain and hematuria.  Musculoskeletal:  Positive for back pain. Negative for arthralgias, joint swelling and myalgias.  Skin: Negative.   Neurological: Negative.  Negative for dizziness, facial asymmetry and weakness.  Hematological:  Negative for adenopathy. Does not bruise/bleed easily.  Psychiatric/Behavioral:  Positive for confusion and decreased concentration. Negative for sleep disturbance. The  patient is not nervous/anxious.     Objective:  BP (!) 142/78 (BP Location: Left Arm, Patient Position: Sitting, Cuff Size: Normal)   Pulse 74   Temp 97.7 F (36.5 C) (Oral)   Resp 16   Ht 6\' 1"  (1.854 m)   Wt 197 lb 9.6 oz (89.6 kg)   SpO2 93%   BMI 26.07 kg/m   Physical Exam Vitals reviewed.  Constitutional:      General: He is not in acute distress.    Appearance: He is not ill-appearing, toxic-appearing or diaphoretic.  HENT:     Mouth/Throat:     Mouth: Mucous membranes are moist.  Eyes:     General: No scleral icterus.    Conjunctiva/sclera: Conjunctivae normal.  Cardiovascular:     Rate and Rhythm: Normal rate and regular rhythm. Occasional Extrasystoles are present.    Heart sounds: No murmur heard.    No friction rub. No gallop.     Comments: EKG- SR with 1st degree AV block and PAC, 71 bpm Anterior/inferior infarct patterns are old No LVH Lateral infarct pattern has improved Pulmonary:     Breath sounds: Examination of the right-lower field reveals rales. Examination of the left-lower field reveals rales. Rales present. No decreased breath sounds, wheezing or rhonchi.  Abdominal:     General: Abdomen is flat.  Palpations: There is no mass.     Tenderness: There is no abdominal tenderness. There is no guarding.     Hernia: No hernia is present.  Musculoskeletal:        General: Normal range of motion.     Cervical back: Neck supple.     Right lower leg: No edema.     Left lower leg: No edema.  Lymphadenopathy:     Cervical: No cervical adenopathy.  Skin:    General: Skin is warm and dry.     Findings: No rash.  Neurological:     General: No focal deficit present.     Mental Status: He is alert. Mental status is at baseline.  Psychiatric:        Attention and Perception: Attention normal.        Mood and Affect: Mood normal.        Speech: Speech is tangential.        Behavior: Behavior normal. Behavior is cooperative.        Thought Content:  Thought content normal.        Cognition and Memory: Cognition is impaired. Memory is impaired. He exhibits impaired recent memory.        Judgment: Judgment normal.     Lab Results  Component Value Date   WBC 7.6 07/13/2023   HGB 15.4 07/13/2023   HCT 46.9 07/13/2023   PLT 187 07/13/2023   GLUCOSE 96 07/13/2023   CHOL 163 07/13/2023   TRIG 207 (H) 07/13/2023   HDL 49 07/13/2023   LDLDIRECT 120.5 09/30/2013   LDLCALC 79 07/13/2023   ALT 11 07/13/2023   AST 23 07/13/2023   NA 138 07/13/2023   K 4.6 07/13/2023   CL 100 07/13/2023   CREATININE 1.17 07/13/2023   BUN 27 07/13/2023   CO2 25 07/13/2023   TSH <0.005 (L) 07/13/2023   PSA 0.67 04/07/2021   INR 1.00 11/26/2013     Assessment & Plan:  Coronary artery disease involving native coronary artery of native heart without angina pectoris -     EKG 12-Lead -     Troponin I (High Sensitivity); Future -     Brain natriuretic peptide; Future  Chronic systolic heart failure (HCC) -     EKG 12-Lead -     Ambulatory referral to Cardiology -     Troponin I (High Sensitivity); Future -     Brain natriuretic peptide; Future  SOB (shortness of breath) on exertion -     EKG 12-Lead -     Troponin I (High Sensitivity); Future -     Brain natriuretic peptide; Future  Need for hepatitis C screening test -     Hepatitis C antibody; Future  Benign prostatic hyperplasia with lower urinary tract symptoms, symptom details unspecified -     PSA; Future  Low TSH level -     Thyroid Panel With TSH; Future  Essential hypertension -     Basic metabolic panel; Future -     CBC with Differential/Platelet; Future -     Urinalysis, Routine w reflex microscopic; Future  Encounter for general adult medical examination with abnormal findings  Non-allergic vasomotor rhinitis -     Azelastine HCl; Place 1 spray into both nostrils 2 (two) times daily. Use in each nostril as directed  Dispense: 30 mL; Refill: 1      Follow-up: Return  in about 3 months (around 12/27/2023).  Sanda Linger, MD

## 2023-09-26 NOTE — Patient Instructions (Signed)
Heart Failure, Diagnosis  Heart failure is a condition in which the heart has trouble pumping blood. This may mean that the heart cannot pump enough blood out to the body or that the heart does not fill up with enough blood. For some people with heart failure, fluid may back up into the lungs. There may also be swelling (edema) in the lower legs. Heart failure is usually a long-term (chronic) condition. It is important for you to take good care of yourself and follow the treatment plan from your health care provider. Different stages of heart failure have different treatment plans. The stages are: Stage A: At risk for heart failure. Having no symptoms of heart failure, but being at risk for developing heart failure. Stage B: Pre-heart failure. Having no symptoms of heart failure, but having structural changes to the heart that indicate heart failure. Stage C: Symptomatic heart failure. Having symptoms of heart failure in addition to structural changes to the heart that indicate heart failure. Stage D: Advanced heart failure. Having symptoms that interfere with daily life and frequent hospitalizations related to heart failure. What are the causes? This condition may be caused by: High blood pressure (hypertension). Hypertension causes the heart muscle to work harder than normal. Coronary artery disease, or CAD. CAD is the buildup of cholesterol and fat (plaque) in the arteries of the heart. Heart attack, also called myocardial infarction. This injures the heart muscle, making it hard for the heart to pump blood. Abnormal heart valves. The valves do not open and close properly, forcing the heart to pump harder to keep the blood flowing. Heart muscle disease, inflammation, or infection (cardiomyopathy or myocarditis). This is damage to the heart muscle. It can increase the risk of heart failure. Lung disease. The heart works harder when the lungs are not healthy. What increases the risk? The risk  of heart failure increases as a person ages. This condition is also more likely to develop in people who: Are obese. Use tobacco or nicotine products. Abuse alcohol or drugs. Have taken medicines that can damage the heart, such as chemotherapy drugs. Have any of these conditions: Diabetes. Abnormal heart rhythms. Thyroid problems. Low blood counts (anemia). Chronic kidney disease. Have a family history of heart failure. What are the signs or symptoms? Symptoms of this condition include: Shortness of breath with activity, such as when climbing stairs. A cough that does not go away. Swelling of the feet, ankles, legs, or abdomen. Losing or gaining weight for no reason. Trouble breathing when lying flat. Waking from sleep because of the need to sit up and get more air. Rapid heartbeat. Other symptoms may include: Tiredness (fatigue) and loss of energy. Feeling light-headed, dizzy, or close to fainting. Nausea or loss of appetite. Waking up more often during the night to urinate (nocturia). Confusion. How is this diagnosed? This condition is diagnosed based on: Your medical history, symptoms, and a physical exam. Blood tests. Diagnostic tests, which may include: Echocardiogram. Electrocardiogram (ECG). Chest X-ray. Exercise stress test. Cardiac MRI. Cardiac catheterization and angiogram. Radionuclide scans. How is this treated? Treatment for this condition is aimed at managing the symptoms of heart failure. Medicines Treatment may include medicines that: Help lower blood pressure by relaxing (dilating) the blood vessels. These medicines are called ACE inhibitors (angiotensin-converting enzyme), ARBs (angiotensin receptor blockers), or vasodilators. Cause the kidneys to remove salt and water from the blood through urination (diuretics). Improve heart muscle strength and prevent the heart from beating too fast (beta blockers). Increase the  force of the heartbeat  (digoxin). Lower heart rates. Certain diabetes medicines (SGLT-2 inhibitors) may also be used in treatment. Healthy behavior changes Treatment may also include making healthy lifestyle changes, such as: Reaching and staying at a healthy weight. Not using tobacco or nicotine products. Eating heart-healthy foods. Limiting or avoiding alcohol. Stopping the use of illegal drugs. Being physically active. Participating in a cardiac rehabilitation program, which is a treatment program to improve your health and well-being through exercise training, education, and counseling. Other treatments Other treatments may include: Procedures to open blocked arteries or repair damaged valves. Placing a pacemaker to improve heart function (cardiac resynchronization therapy). Placing a device to treat serious abnormal heart rhythms (implantable cardioverter defibrillator, or ICD). Placing a device to improve the pumping ability of the heart (left ventricular assist device, or LVAD). Receiving a healthy heart from a donor (heart transplant). This is done when other treatments have not helped. Follow these instructions at home: Manage other health conditions as told by your health care provider. These may include hypertension, diabetes, thyroid disease, or abnormal heart rhythms. Get ongoing education and support as needed. Learn as much as you can about heart failure. Keep all follow-up visits. This is important. Where to find more information American Heart Association: www.heart.org Centers for Disease Control and Prevention: FootballExhibition.com.br NIH [employer] on Aging: https://walker.com/ Summary Heart failure is a condition in which the heart has trouble pumping blood. This condition is commonly caused by high blood pressure and other diseases of the heart and lungs. Symptoms of this condition include shortness of breath, tiredness (fatigue), nausea, and swelling of the feet, ankles, legs, or  abdomen. Treatments for this condition may include medicines, lifestyle changes, and surgery. Manage other health conditions as told by your health care provider. This information is not intended to replace advice given to you by your health care provider. Make sure you discuss any questions you have with your health care provider. Document Revised: 01/25/2022 Document Reviewed: 05/09/2020 Elsevier Patient Education  2024 [employer].

## 2023-09-27 ENCOUNTER — Other Ambulatory Visit: Payer: Self-pay

## 2023-09-27 DIAGNOSIS — J300 Vasomotor rhinitis: Secondary | ICD-10-CM

## 2023-09-27 DIAGNOSIS — R0989 Other specified symptoms and signs involving the circulatory and respiratory systems: Secondary | ICD-10-CM | POA: Insufficient documentation

## 2023-09-27 DIAGNOSIS — R053 Chronic cough: Secondary | ICD-10-CM | POA: Insufficient documentation

## 2023-09-27 LAB — THYROID PANEL WITH TSH
Free Thyroxine Index: 3.2 (ref 1.4–3.8)
T3 Uptake: 31 % (ref 22–35)
T4, Total: 10.4 ug/dL (ref 4.9–10.5)
TSH: 0.03 m[IU]/L — ABNORMAL LOW (ref 0.40–4.50)

## 2023-09-27 LAB — HEPATITIS C ANTIBODY: Hepatitis C Ab: NONREACTIVE

## 2023-09-27 MED ORDER — SHINGRIX 50 MCG/0.5ML IM SUSR: 0.5000 mL | Freq: Once | INTRAMUSCULAR | 1 refills | Status: AC

## 2023-09-27 MED ORDER — AZELASTINE HCL 0.1 % NA SOLN: 1.0000 | Freq: Two times a day (BID) | NASAL | 1 refills | Status: DC

## 2023-10-02 ENCOUNTER — Encounter: Payer: Self-pay | Admitting: Cardiology

## 2023-10-03 ENCOUNTER — Other Ambulatory Visit: Payer: Self-pay

## 2023-10-03 DIAGNOSIS — I5022 Chronic systolic (congestive) heart failure: Secondary | ICD-10-CM

## 2023-10-03 DIAGNOSIS — R7989 Other specified abnormal findings of blood chemistry: Secondary | ICD-10-CM

## 2023-10-04 ENCOUNTER — Other Ambulatory Visit: Payer: Self-pay

## 2023-10-04 DIAGNOSIS — R7989 Other specified abnormal findings of blood chemistry: Secondary | ICD-10-CM

## 2023-10-06 ENCOUNTER — Other Ambulatory Visit: Payer: Medicare Other

## 2023-10-07 LAB — T4, FREE: Free T4: 1.3 ng/dL (ref 0.8–1.8)

## 2023-10-07 LAB — TSH: TSH: 0.12 m[IU]/L — ABNORMAL LOW (ref 0.40–4.50)

## 2023-10-09 ENCOUNTER — Ambulatory Visit
Admission: RE | Admit: 2023-10-09 | Discharge: 2023-10-09 | Disposition: A | Discharge status: Home / Self Care | Payer: Medicare Other | Source: Ambulatory Visit | Attending: Internal Medicine | Admitting: Internal Medicine

## 2023-10-09 DIAGNOSIS — R0989 Other specified symptoms and signs involving the circulatory and respiratory systems: Secondary | ICD-10-CM

## 2023-10-09 DIAGNOSIS — R053 Chronic cough: Secondary | ICD-10-CM

## 2023-10-11 NOTE — Progress Notes (Signed)
Cardiology Office Note    Date:  10/16/2023   ID:  DIAMANTE DEBENEDICTIS, DOB 1951-05-05, MRN [id number]  PCP:  Etta Grandchild, MD  Cardiologist:  Rajvi Armentor [place], MD    History of Present Illness:  Keith Vazquez is a 72 y.o. male seen for follow up CAD, CHF, and Afib. He is status post afib ablation 05/05/14.   He has an ischemic CM with chronic systolic CHF.   He is s/p CABG in 2000 by Dr Morton Peters. Cardiac caths in 2002 and 2015 showed atretic LIMA to the LAD which filled by the SVG to the second diagonal. The SVG to the second diagonal, SVG sequentially to the ramus and OM, and the SVG to PDA were all patent. Myoview study in April 2016 was felt to be low risk. He had an ETT in June 2017 with no ischemia at 13 METs. He also has a history of HTN.  In January of 2018 he was started on Entresto- reported intolerance of higher doses.  He states he could not tolerate aldactone due to severe depression and quit taking it.  Myoview in 2019 showed inferior scar without ischemia and EF 30%. Echo in May 2021 showed EF down to 25-30%. He was seen by Dr. Johney Frame. No mention of a ILR but he did discuss consideration of ICD. Not a candidate for CRT due to narrow QRS. 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   He has symptomatic atrial fibrillation that occur sporadically.   He has an Apple watch which he uses to track these episodes.  He  wore an event monitor in 2021 which confirmed he is having paroxysms of atrial fibrillation.  The burden was 2-3%. He was seen by Dr Lalla Brothers in 2021. His note indicates complete discussions about AAD therapy, anticoagulation, repeat ablation, and ICD. Patient did not want to consider ICD. Dr Lalla Brothers felt that with low Afib burden the best thing to do was to monitor for now unless Afib burden increases significantly.   Previously he noted more frequent episodes of afib. Ultimately we decided to resume amiodarone 400 mg daily  and Eliquis. He ended up stopping Eliquis due to concerns of bruising.    He reported  persistent dyspnea so tapered off Coreg completely and felt like this helped but he still felt some dyspnea with exertion. We repeated Echo in April and EF 30-35%. Encourage retrial of Jardiance but he did not want to take.  He states he did develop a tremor and he ended up resuming Coreg with improvement in this.  He states he quit taking Entresto because he couldn't see a change with it and his EF did not change.  On his last evaluation he was noted to be hyperthyroid due to amiodarone. He checked with his pharmacy and noted he was taking a different formulation of amiodarone. He switched back to the prior formulation (that he had to get at [employer]). After this he has noted much less Afib and generally feels better. He also notes Sunday is the only day he goes to the gym in the morning and he skips his Coreg then and feels like his dyspnea is better and HR gets about 10 beats higher. He quit taking a diuretic because this made him feel drained.    Past Medical History:  Diagnosis Date   Atrial flutter (HCC)    CHF (congestive heart failure) (HCC)  LVEF at time of cath 2002 50%; now has been 35-40% since 2008 (patient has seen multiple cardiologists over the years including [facility], [place] [facility], [facility], [facility] and Viann Fish, MD)   Coronary artery disease    CABG x5 in 2000; s/p cath 2002 > 4/5 grafts patent, LVEF 50%   Degeneration of lumbar intervertebral disc    GERD (gastroesophageal reflux disease)    GI bleed    duodenal ulcer   Hypertension    Borderline   Ischemic heart disease    s/p MI, CABG 2000, s/p cath 2002 with 4/5 grafts patent (LIMA to LAD atretic and occluded mid vessel but adequate flow to distal LAD from collaterals / diagonal   Low back pain    PAC (premature atrial contraction)    PAF (paroxysmal atrial fibrillation) (HCC)    initially  diagnosed during stress test 2002   PAF (paroxysmal atrial fibrillation) (HCC)    Peyronie's disease     Past Surgical History:  Procedure Laterality Date   ABLATION  05-06-2014   PVI and CTI ablation by Dr Johney Frame   ATRIAL FIBRILLATION ABLATION N/A 05/06/2014   Procedure: ATRIAL FIBRILLATION ABLATION;  Surgeon: Gardiner Rhyme, MD;  Location: [facility] CATH LAB;  Service: Cardiovascular;  Laterality: N/A;   CAPSULOTOMY Right 06/15/2022   Procedure: CAPSULOTOMY TOES 2-4;  Surgeon: Felecia Shelling, DPM;  Location: [facility] ORS;  Service: Podiatry;  Laterality: Right;   CARDIAC CATHETERIZATION  09/14/2001    Mildly decreased left ventricular systolic function --  Native three vessel coronary artery disease as described. -- Status post coronary artery bypass grafting   CORONARY ARTERY BYPASS GRAFT  2000   LIMA to LAD, SVG to second diagonal, seq SVG to ramus and OM, SVG to PDA   HAMMER TOE SURGERY Right 06/15/2022   Procedure: HAMMER TOE CORRECTION TOES 2-4;  Surgeon: Felecia Shelling, DPM;  Location: [facility] ORS;  Service: Podiatry;  Laterality: Right;   HERNIA REPAIR  05/16/2001   Large left indirect inguinal hernia with right direct hernia.   HERNIA REPAIR     Recurrent left inguinal hernia -- Large left indirect inguinal hernia with right direct hernia.   LEFT HEART CATHETERIZATION WITH CORONARY/GRAFT ANGIOGRAM  11/26/2013   Procedure: LEFT HEART CATHETERIZATION WITH Isabel Caprice;  Surgeon: Taiden Raybourn M [place], MD;  Location: [facility] CATH LAB;  Service: Cardiovascular;;   TEE WITHOUT CARDIOVERSION N/A 05/06/2014   Procedure: TRANSESOPHAGEAL ECHOCARDIOGRAM (TEE);  Surgeon: Lars Masson, MD;  Location[facility] ENDOSCOPY;  Service: Cardiovascular;  Laterality: N/A;    Current Medications: Outpatient Medications Prior to Visit  Medication Sig Dispense Refill   amiodarone (PACERONE) 200 MG tablet Take 2 tablets ( 400 mg ) daily 180 tablet 3   amitriptyline (ELAVIL) 10 MG tablet Take 10 mg by mouth at bedtime.      Ascorbic Acid (VITAMIN C) 1000 MG tablet Take 1,000 mg by mouth daily.     azelastine (ASTELIN) 0.1 % nasal spray Place 1 spray into both nostrils 2 (two) times daily. Use in each nostril as directed 30 mL 1   cholecalciferol (VITAMIN D3) 25 MCG (1000 UNIT) tablet Take 1,000 Units by mouth daily.     dicyclomine (BENTYL) 10 MG capsule Take 10 mg by mouth 4 (four) times daily as needed.     NURTEC 75 MG TBDP Take 75 mg by mouth as needed (The onset of a migraines).     sacubitril-valsartan (ENTRESTO) 24-26 MG Take 1 tablet by mouth  2 (two) times daily. 180 tablet 3   tamsulosin (FLOMAX) 0.4 MG CAPS capsule Take 0.4 mg by mouth daily.     triamterene-hydrochlorothiazide (MAXZIDE-25) 37.5-25 MG tablet Take by mouth daily.     carvedilol (COREG) 12.5 MG tablet Take 1 tablet (12.5 mg total) by mouth 2 (two) times daily. 180 tablet 3   furosemide (LASIX) 20 MG tablet Take 1 tablet (20 mg total) by mouth daily. (Patient not taking: Reported on 09/26/2023) 90 tablet 3   valsartan (DIOVAN) 40 MG tablet Take 40 mg by mouth daily. (Patient not taking: Reported on 09/26/2023)     No facility-administered medications prior to visit.     Allergies:   Patient has no known allergies.   Social History   Socioeconomic History   Marital status: Single    Spouse name: Not on file   Number of children: 0   Years of education: Not on file   Highest education level: Professional school degree (e.g., MD, DDS, DVM, JD)  Occupational History   Occupation: garage Energy manager  Tobacco Use   Smoking status: Former    Current packs/day: 0.00    Average packs/day: 1 pack/day for 20.0 years (20.0 ttl pk-yrs)    Types: Cigarettes    Start date: 10/31/1974    Quit date: 10/31/1994    Years since quitting: 28.9   Smokeless tobacco: Never  Vaping Use   Vaping status: Never Used  Substance and Sexual Activity   Alcohol use: No    Alcohol/week: 0.0 standard drinks of alcohol   Drug use: No   Sexual  activity: Yes  Other Topics Concern   Not on file  Social History Narrative   Lives in [place].  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  Also flies airplanes   Social Drivers of Health   Financial Resource Strain: Low Risk  (09/25/2023)   Overall Financial Resource Strain (CARDIA)    Difficulty of Paying Living Expenses: Not hard at all  Food Insecurity: No Food Insecurity (09/25/2023)   Hunger Vital Sign    Worried About Running Out of Food in the Last Year: Never true    Ran Out of Food in the Last Year: Never true  Transportation Needs: No Transportation Needs (09/25/2023)   PRAPARE - Administrator, Civil Service (Medical): No    Lack of Transportation (Non-Medical): No  Physical Activity: Sufficiently Active (09/25/2023)   Exercise Vital Sign    Days of Exercise per Week: 3 days    Minutes of Exercise per Session: 80 min  Stress: No Stress Concern Present (09/25/2023)   [employer] of Occupational Health - Occupational Stress Questionnaire    Feeling of Stress : Not at all  Social Connections: Socially Isolated (09/25/2023)   Social Connection and Isolation Panel [NHANES]    Frequency of Communication with Friends and Family: More than three times a week    Frequency of Social Gatherings with Friends and Family: More than three times a week    Attends Religious Services: Never    Database administrator or Organizations: No    Attends Engineer, structural: Not on file    Marital Status: Never married     Family History:  The patient's family history is not on file. He was adopted.   ROS:   Please see the history of present illness.    ROS All other systems reviewed and are negative.   PHYSICAL EXAM:   VS:  Pulse 61  Ht 6\' 1"  (1.854 m)   Wt 197 lb (89.4 kg)   SpO2 94%   BMI 25.99 kg/m    GEN: Well nourished, well developed, in no acute distress  HEENT: normal  Neck: no JVD, carotid bruits, or masses Cardiac: RRR; no  murmurs, rubs, or gallops,no edema  Respiratory:  clear to auscultation bilaterally, normal work of breathing GI: soft, nontender, nondistended, + BS MS: no deformity or atrophy  Skin: warm and dry, no rash Neuro:  Alert and Oriented x 3, Strength and sensation are intact Psych: euthymic mood, full affect  Wt Readings from Last 3 Encounters:  10/16/23 197 lb (89.4 kg)  09/26/23 197 lb 9.6 oz (89.6 kg)  09/04/23 191 lb (86.6 kg)      Studies/Labs Reviewed:   EKG:  EKG is not  ordered today.     Recent Labs: 07/13/2023: ALT 11 09/26/2023: BUN 21; Creatinine, Ser 1.19; Hemoglobin 15.2; Platelets 186.0; Potassium 4.9; Pro B Natriuretic peptide (BNP) 212.0; Sodium 139 10/06/2023: TSH 0.12   Lipid Panel    Component Value Date/Time   CHOL 163 07/13/2023 1433   TRIG 207 (H) 07/13/2023 1433   HDL 49 07/13/2023 1433   CHOLHDL 3.3 07/13/2023 1433   CHOLHDL 4 02/08/2021 1134   VLDL 38.0 02/08/2021 1134   LDLCALC 79 07/13/2023 1433   LDLDIRECT 120.5 09/30/2013 1400    Additional studies/ records that were reviewed today include:  ETT 04/21/16: Study Highlights   There was no ST segment deviation noted during stress.   Ambient PVC;s at rest, with stress and recovery No significant NSVT No ischemia at 13.2 METS and 102% of PMHR   Echo: 04/21/16: Study Conclusions   - Left ventricle: The cavity size was normal. Wall thickness was   normal. Basal to mid inferior akinesis. Basal to mid   inferolateral akinesis. Anterolateral hypokinesis. Systolic   function was mildly to moderately reduced. The estimated ejection   fraction was in the range of 40% to 45%. Doppler parameters are   consistent with abnormal left ventricular relaxation (grade 1   diastolic dysfunction). - Aortic valve: There was no stenosis. - Aorta: Mildly dilated aortic root. Aortic root dimension: 38 mm   (ED). - Mitral valve: Mildly calcified annulus. There was trivial   regurgitation. - Left atrium: The  atrium was mildly dilated. - Right ventricle: The cavity size was normal. Systolic function   was mildly reduced. - Tricuspid valve: Peak RV-RA gradient (S): 16 mm Hg. - Pulmonary arteries: PA peak pressure: 19 mm Hg (S). - Inferior vena cava: The vessel was normal in size. The   respirophasic diameter changes were in the normal range (= 50%),   consistent with normal central venous pressure.   Impressions:   - Normal LV size with EF 40-45%. Wall motion abnormalities as noted   above. Normal RV size with mildly decreased systolic function. No   significant valvular abnormalities.  Myoview 11/10/17: Study Highlights    Nuclear stress EF: 30%. The left ventricular ejection fraction is moderately decreased (30-44%). Defect 1: There is a defect present in the basal inferolateral and mid inferolateral location. Findings consistent with prior myocardial infarction. This is a high risk study.   High risk stress nuclear study due to severely reduced global systolic function. There is evidence of an inferolateral scar (previously seen), but there is no reversible ischemia. There is global hypokinesis, worse in the inferolateral wall. LVEF has decreased slightly from 2016 (previously 36%), otherwise little change.  Echo 03/16/20: IMPRESSIONS     1. Global hypokinesis with akinesis of the inferobasal and inferolateral  walls; overall severe LV dysfunction; moderate LVE; mild LVH; grade 1  diastolic dysfunction; mildly dilated aortic root.   2. Left ventricular ejection fraction, by estimation, is 25 to 30%. The  left ventricle has severely decreased function. The left ventricle  demonstrates regional wall motion abnormalities (see scoring  diagram/findings for description). The left  ventricular internal cavity size was moderately dilated. There is mild  left ventricular hypertrophy. Left ventricular diastolic parameters are  consistent with Grade I diastolic dysfunction (impaired  relaxation).   3. Right ventricular systolic function is normal. The right ventricular  size is normal.   4. The mitral valve is normal in structure. Trivial mitral valve  regurgitation. No evidence of mitral stenosis.   5. The aortic valve is tricuspid. Aortic valve regurgitation is not  visualized. No aortic stenosis is present.   6. Aortic dilatation noted. There is mild dilatation of the aortic root  measuring 38 mm.   08/13/20: Event monitor: Study Highlights  Normal sinus rhythm Paroxysmal atrial fibrillation with rapid response and some aberrantion NSVT up to 5 beats   Echo 02/10/23: IMPRESSIONS     1. Left ventricular ejection fraction, by estimation, is 30 to 35%. The  left ventricle has moderately decreased function. The left ventricle  demonstrates global hypokinesis. The left ventricular internal cavity size  was mildly dilated. Left ventricular  diastolic parameters are consistent with Grade I diastolic dysfunction  (impaired relaxation). There is akinesis of the left ventricular, basal  inferior wall and inferolateral wall. There is hypokinesis of the left  ventricular, mid-apical inferolateral  wall. The average left ventricular global longitudinal strain is -12.4 %.  The global longitudinal strain is abnormal.   2. Right ventricular systolic function is mildly reduced. The right  ventricular size is normal.   3. The mitral valve is normal in structure. Trivial mitral valve  regurgitation. No evidence of mitral stenosis.   4. The aortic valve is normal in structure. Aortic valve regurgitation is  not visualized. No aortic stenosis is present.   5. The inferior vena cava is normal in size with greater than 50%  respiratory variability, suggesting right atrial pressure of 3 mmHg.   ASSESSMENT:    No diagnosis found.    PLAN:   1.  CAD: s/p CABG 2000. Known atretic LIMA to LAD but other grafts patent by cath in 2015. Prior Myoview studies have been abnormal so  I don't think would be very helpful to assess circulation now.  - he has class 1 angina.   - not on ASA  - if he needs ischemic evaluation I think cardiac cath would be the only useful way to evaluate  2.  Chronic systolic CHF: EF 91-47% - back on Coreg which has helped tremor. May be a component of chronotropic incompetence - intolerant of aldactone 25 mg daily due to depression - Jardiance in past  expensive.  - quit taking Entresto because he didn't see any benefit. Explained that studies indicate a lower mortality and risk for worsening CHF with this therapy. Now back on it - refused ICD - off diuretics - patient would like referral to Advanced heart failure. I have encouraged this   3.  Hypertension: -BP is well controlled.    4.  Hyperlipidemia: -He is not on a statin -Target LDL is less than 70 - we discussed results of lipid panel. He is not  interested in taking lipid lowering therapy. - last LDL 79    5.  Paroxysmal Afib -prior more frequent breakthrough which he attributes to change in amiodarone formulation - regular rhythm today - [place] vasc score of 4- patient stopped taking Eliquis on his own due to concern about bruising.  -  I will attempt to get old OP note from CABG in 2000 to see if he had LA ligation.   Addendum: I did receive a copy of his OP note from his CABG and he DID NOT have LA appendage closure at the time of surgery  6. 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 Last TSH improved and T4 normal. Monitor.    Corrin Hingle [place], MD ,[facility] 10/16/2023 4:41 PM    [facility] [address] [place], [place], [address] [phone]

## 2023-10-12 ENCOUNTER — Ambulatory Visit: Payer: BLUE CROSS/BLUE SHIELD | Admitting: "Endocrinology

## 2023-10-16 ENCOUNTER — Ambulatory Visit: Payer: Medicare Other | Attending: Cardiology | Admitting: Cardiology

## 2023-10-16 ENCOUNTER — Encounter: Payer: Self-pay | Admitting: Cardiology

## 2023-10-16 VITALS — HR 61 | Ht 73.0 in | Wt 197.0 lb

## 2023-10-16 DIAGNOSIS — T462X5A Adverse effect of other antidysrhythmic drugs, initial encounter: Secondary | ICD-10-CM | POA: Diagnosis present

## 2023-10-16 DIAGNOSIS — T462X5D Adverse effect of other antidysrhythmic drugs, subsequent encounter: Secondary | ICD-10-CM

## 2023-10-16 DIAGNOSIS — E785 Hyperlipidemia, unspecified: Secondary | ICD-10-CM | POA: Insufficient documentation

## 2023-10-16 DIAGNOSIS — I10 Essential (primary) hypertension: Secondary | ICD-10-CM | POA: Insufficient documentation

## 2023-10-16 DIAGNOSIS — I5022 Chronic systolic (congestive) heart failure: Secondary | ICD-10-CM | POA: Insufficient documentation

## 2023-10-16 DIAGNOSIS — I480 Paroxysmal atrial fibrillation: Secondary | ICD-10-CM | POA: Insufficient documentation

## 2023-10-16 DIAGNOSIS — E0580 Other thyrotoxicosis without thyrotoxic crisis or storm: Secondary | ICD-10-CM | POA: Insufficient documentation

## 2023-10-16 DIAGNOSIS — I2510 Atherosclerotic heart disease of native coronary artery without angina pectoris: Secondary | ICD-10-CM | POA: Insufficient documentation

## 2023-10-16 NOTE — Patient Instructions (Signed)
Medication Instructions:  Continue same medications *If you need a refill on your cardiac medications before your next appointment, please call your pharmacy*   Lab Work: None ordered   Testing/Procedures: None ordered   Follow-Up: At [facility], you and your health needs are our priority.  As part of our continuing mission to provide you with exceptional heart care, we have created designated Provider Care Teams.  These Care Teams include your primary Cardiologist (physician) and Advanced Practice Providers (APPs -  Physician Assistants and Nurse Practitioners) who all work together to provide you with the care you need, when you need it.  We recommend signing up for the patient portal called "MyChart".  Sign up information is provided on this After Visit Summary.  MyChart is used to connect with patients for Virtual Visits (Telemedicine).  Patients are able to view lab/test results, encounter notes, upcoming appointments, etc.  Non-urgent messages can be sent to your provider as well.   To learn more about what you can do with MyChart, go to [email or web address].    Your next appointment:  6 months    Call in March to schedule June appointment     Provider:  Dr.Jordan      [facility] will call with appointment

## 2023-10-17 ENCOUNTER — Other Ambulatory Visit: Payer: Self-pay

## 2023-10-17 ENCOUNTER — Ambulatory Visit: Payer: Medicare Other | Admitting: Family Medicine

## 2023-10-17 VITALS — BP 142/78 | HR 64 | Ht 73.0 in | Wt 193.0 lb

## 2023-10-17 DIAGNOSIS — G8929 Other chronic pain: Secondary | ICD-10-CM

## 2023-10-17 DIAGNOSIS — M25561 Pain in right knee: Secondary | ICD-10-CM

## 2023-10-17 DIAGNOSIS — M1711 Unilateral primary osteoarthritis, right knee: Secondary | ICD-10-CM | POA: Diagnosis not present

## 2023-10-17 MED ORDER — TRIAMCINOLONE ACETONIDE 32 MG IX SRER
32.0000 mg | Freq: Once | INTRA_ARTICULAR | Status: AC
  Administered 2023-10-17: 32 mg via INTRA_ARTICULAR

## 2023-10-17 NOTE — Progress Notes (Signed)
   Keith Payor, PhD, LAT, ATC acting as a scribe for Keith Graham, MD.  Keith Vazquez is a 72 y.o. male who presents to [facility] today for exacerbation of his R knee pain. Pt was last seen by Dr. Denyse Amass on 05/31/23 and was given a R knee Zilretta injection.  Today, pt reports R knee has started to become painful again over the last week. He is wanting to get ahead of the pain and get the injection now.  Dx imaging: 04/13/21 R knee XR   Pertinent review of systems: No fevers or chills  Relevant historical information: Heart failure and PAF.   Exam:  BP (!) 142/78   Pulse 64   Ht 6\' 1"  (1.854 m)   Wt 193 lb (87.5 kg)   SpO2 94%   BMI 25.46 kg/m  General: Well Developed, well nourished, and in no acute distress.   MSK: Right knee mild effusion normal motion.    Lab and Radiology Results   Zilretta injection Right knee Procedure: Real-time Ultrasound Guided Injection of right knee joint superior lateral patellar space Device: Philips Affiniti 50G Images permanently stored and available for review in PACS Verbal informed consent obtained.  Discussed risks and benefits of procedure. Warned about infection, hyperglycemia bleeding, damage to structures among others. Patient expresses understanding and agreement Time-out conducted.   Noted no overlying erythema, induration, or other signs of local infection.   Skin prepped in a sterile fashion.   Local anesthesia: Topical Ethyl chloride.   With sterile technique and under real time ultrasound guidance: Zilretta 32 mg injected into knee joint. Fluid seen entering the joint capsule.   Completed without difficulty   Advised to call if fevers/chills, erythema, induration, drainage, or persistent bleeding.   Images permanently stored and available for review in the ultrasound unit.  Impression: Technically successful ultrasound guided injection.  Lot number: [id number]     Assessment and  Plan: 72 y.o. male with right knee pain due to exacerbation of DJD.  Plan for repeat Zilretta injection today.  Check back as needed.  Exacerbation of chronic problem. PDMP not reviewed this encounter. Orders Placed This Encounter  Procedures   [place] LIMITED JOINT SPACE STRUCTURES LOW RIGHT(NO LINKED CHARGES)    Reason for Exam (SYMPTOM  OR DIAGNOSIS REQUIRED):   right knee pain    Preferred imaging location?:   [facility]   Meds ordered this encounter  Medications   Triamcinolone Acetonide (ZILRETTA) intra-articular injection 32 mg     Discussed warning signs or symptoms. Please see discharge instructions. Patient expresses understanding.   The above documentation has been reviewed and is accurate and complete Keith Vazquez, M.D.

## 2023-10-17 NOTE — Patient Instructions (Addendum)
Thank you for coming in today.   You received an injection today. Seek immediate medical attention if the joint becomes red, extremely painful, or is oozing fluid.   Pennie Rushing, MD  Let me know what we need to do next.

## 2023-10-19 ENCOUNTER — Encounter: Payer: Self-pay | Admitting: Internal Medicine

## 2023-10-19 ENCOUNTER — Other Ambulatory Visit: Payer: Self-pay | Admitting: Internal Medicine

## 2023-10-19 DIAGNOSIS — J84112 Idiopathic pulmonary fibrosis: Secondary | ICD-10-CM | POA: Insufficient documentation

## 2023-10-30 NOTE — Telephone Encounter (Signed)
Last Zilretta inj 10/17/23 Can consider repeat inj on or after 01/10/24

## 2023-11-03 ENCOUNTER — Encounter: Payer: Self-pay | Admitting: Family Medicine

## 2023-11-09 ENCOUNTER — Encounter (HOSPITAL_COMMUNITY): Payer: Self-pay | Admitting: Internal Medicine

## 2023-11-09 ENCOUNTER — Ambulatory Visit (HOSPITAL_COMMUNITY)
Admission: RE | Admit: 2023-11-09 | Discharge: 2023-11-09 | Disposition: A | Discharge status: Home / Self Care | Payer: Medicare Other | Source: Ambulatory Visit | Attending: Internal Medicine | Admitting: Internal Medicine

## 2023-11-09 ENCOUNTER — Other Ambulatory Visit (HOSPITAL_COMMUNITY): Payer: Self-pay

## 2023-11-09 VITALS — BP 110/70 | HR 67 | Wt 196.2 lb

## 2023-11-09 DIAGNOSIS — Z951 Presence of aortocoronary bypass graft: Secondary | ICD-10-CM | POA: Insufficient documentation

## 2023-11-09 DIAGNOSIS — I5022 Chronic systolic (congestive) heart failure: Secondary | ICD-10-CM | POA: Diagnosis present

## 2023-11-09 DIAGNOSIS — I2510 Atherosclerotic heart disease of native coronary artery without angina pectoris: Secondary | ICD-10-CM | POA: Insufficient documentation

## 2023-11-09 DIAGNOSIS — I110 Hypertensive heart disease with heart failure: Secondary | ICD-10-CM | POA: Diagnosis not present

## 2023-11-09 DIAGNOSIS — E0580 Other thyrotoxicosis without thyrotoxic crisis or storm: Secondary | ICD-10-CM

## 2023-11-09 DIAGNOSIS — E0590 Thyrotoxicosis, unspecified without thyrotoxic crisis or storm: Secondary | ICD-10-CM | POA: Diagnosis not present

## 2023-11-09 DIAGNOSIS — Z79899 Other long term (current) drug therapy: Secondary | ICD-10-CM | POA: Insufficient documentation

## 2023-11-09 DIAGNOSIS — T462X5A Adverse effect of other antidysrhythmic drugs, initial encounter: Secondary | ICD-10-CM

## 2023-11-09 DIAGNOSIS — I480 Paroxysmal atrial fibrillation: Secondary | ICD-10-CM | POA: Insufficient documentation

## 2023-11-09 DIAGNOSIS — R0602 Shortness of breath: Secondary | ICD-10-CM | POA: Diagnosis not present

## 2023-11-09 MED ORDER — EMPAGLIFLOZIN 10 MG PO TABS: 10.0000 mg | ORAL_TABLET | Freq: Every day | ORAL | 1 refills | Status: AC

## 2023-11-09 NOTE — Patient Instructions (Addendum)
 Good to see you today!  START Jardiance  10 mg daily  Your physician has recommended that you have a cardiopulmonary stress test (CPX). CPX testing is a non-invasive measurement of heart and lung function. It replaces a traditional treadmill stress test. This type of test provides a tremendous amount of information that relates not only to your present condition but also for future outcomes. This test combines measurements of you ventilation, respiratory gas exchange in the lungs, electrocardiogram (EKG), blood pressure and physical response before, during, and following an exercise protocol.  Your physician recommends that you schedule a follow-up appointment in: 4 months(May) Call office in February to schedule an appointment.  If you have any questions or concerns before your next appointment please send us  a message through [facility] or call our office at [phone].    TO LEAVE A MESSAGE FOR THE NURSE SELECT OPTION 2, PLEASE LEAVE A MESSAGE INCLUDING: YOUR NAME DATE OF BIRTH CALL BACK NUMBER REASON FOR CALL**this is important as we prioritize the call backs  YOU WILL RECEIVE A CALL BACK THE SAME DAY AS LONG AS YOU CALL BEFORE 4:00 PM At the [facility], you and your health needs are our priority. As part of our continuing mission to provide you with exceptional heart care, we have created designated Provider Care Teams. These Care Teams include your primary Cardiologist (physician) and Advanced Practice Providers (APPs- Physician Assistants and Nurse Practitioners) who all work together to provide you with the care you need, when you need it.   You may see any of the following providers on your designated Care Team at your next follow up: Dr Toribio Fuel Dr Ezra Shuck Dr. Ria Commander Dr. Morene Brownie Amy Lenetta, NP Caffie Shed, [place] [facility] [place], [place] Beckey Coe, NP Jordan Lee, NP Tinnie Redman, PharmD   Please be sure to bring  in all your medications bottles to every appointment.    Thank you for choosing [facility]

## 2023-11-09 NOTE — Progress Notes (Signed)
 [facility] CONSULT NOTE  Referring Physician: Joshua Debby CROME, MD Primary Care: Joshua Debby CROME, MD Primary Cardiologist: Peter Jordan, MD  Chief Complaint: Heart failure  HPI:  Keith Vazquez is a 73 y.o. male with h/o CAD s/p CABG, PAF s/p ablation and systolic HF referred by Dr. Jordan for further evaluation of his HF   He is s/p CABG in 2000 by Dr Fleeta Ochoa. Cardiac caths in 2002 and 2015 showed atretic LIMA to the LAD which filled by the SVG to the second diagonal. The SVG to the second diagonal, SVG sequentially to the ramus and OM, and the SVG to PDA were all patent. Myoview  study in April 2016 was felt to be low risk. He had an ETT in June 2017 with no ischemia at 13 METs.  He is status post afib ablation 05/05/14. He has an Apple watch which he uses to track these episodes.  He  wore an event monitor in 2021 which confirmed he is having paroxysms of atrial fibrillation.  The burden was 2-3%. He was seen by Dr Cindie in 2021. His note indicates complete discussions about AAD therapy, anticoagulation, repeat ablation, and ICD. Patient did not want to consider ICD. Dr Cindie felt that with low Afib burden the best thing to do was to monitor for now unless Afib burden increases significantly. Ultimately put on amio and Eliquis . He ended up stopping Eliquis  due to concerns of bruising. Developed amio-induced thyrotoxcosis.    He has an ischemic CM with chronic systolic HF.  Myoview  in 2019 showed inferior scar without ischemia and EF 30%. Echo in May 2021 showed EF down to 25-30%. He was seen by Dr. Kelsie in the past and deferred ICD.  In January of 2018 he was started on Entresto - reported intolerance of higher doses.  He states he could not tolerate aldactone  due to severe depression and quit taking it.   He was seen by Dr. Kelsie. No mention of a ILR but he did discuss consideration of ICD. Not a candidate for CRT due to narrow QRS. Felt to be a candidate for ICD but patient  deferred possibly out of his wish to continue flying.    Echo 4/24 EF 30-35%. RV mildly reduced Encourage retrial of Jardiance  but he did not want to take.  He states he did develop a tremor and he ended up resuming Coreg  with improvement in this.  He states he quit taking Entresto  because he couldn't see a change with it and his EF did not change.He quit taking a diuretic because this made him feel drained.  Currently on Entresto  24/26 bid and carvedilol  12.5 bid   Here for HF evaluation. Former control and instrumentation engineer. Says he is here today because he gets winded easily and wants that fixed. Says he does feel better after his hyperthyroidism fixed. Can easily walk a mile at a very fast pace but says he gets winded. Says he walks faster than anyone else around. Does 36 min of cardio at the gym 3x/week. Uses a max trained (like Teressa ladder) x 12 mins. Then TM and bike x 84m. No CP, edema, orthopnea or PND. Does not need lasix . Says he gets SOB coming out of shower.    Past Medical History:  Diagnosis Date   Atrial flutter (HCC)    CHF (congestive heart failure) (HCC)    LVEF at time of cath 2002 50%; now has been 35-40% since 2008 (patient has seen multiple cardiologists over the years  including [facility] [place], [facility], [facility] and Jacques Somerset, MD)   Coronary artery disease    CABG x5 in 2000; s/p cath 2002 > 4/5 grafts patent, LVEF 50%   Degeneration of lumbar intervertebral disc    GERD (gastroesophageal reflux disease)    GI bleed    duodenal ulcer   Hypertension    Borderline   Ischemic heart disease    s/p MI, CABG 2000, s/p cath 2002 with 4/5 grafts patent (LIMA to LAD atretic and occluded mid vessel but adequate flow to distal LAD from collaterals / diagonal   Low back pain    PAC (premature atrial contraction)    PAF (paroxysmal atrial fibrillation) (HCC)    initially diagnosed during stress test 2002   PAF (paroxysmal atrial fibrillation) (HCC)     Peyronie's disease     Current Outpatient Medications  Medication Sig Dispense Refill   amiodarone  (PACERONE ) 200 MG tablet Take 2 tablets ( 400 mg ) daily 180 tablet 3   Ascorbic Acid (VITAMIN C) 1000 MG tablet Take 1,000 mg by mouth daily.     azelastine  (ASTELIN ) 0.1 % nasal spray Place 1 spray into both nostrils 2 (two) times daily. Use in each nostril as directed 30 mL 1   carvedilol  (COREG ) 12.5 MG tablet Take 6.25 mg by mouth in the morning. 12.5 mg in the evening     cholecalciferol (VITAMIN D3) 25 MCG (1000 UNIT) tablet Take 1,000 Units by mouth daily.     dicyclomine  (BENTYL ) 10 MG capsule Take 10 mg by mouth 4 (four) times daily as needed.     NURTEC 75 MG TBDP Take 75 mg by mouth as needed (The onset of a migraines).     sacubitril -valsartan  (ENTRESTO ) 24-26 MG Take 1 tablet by mouth 2 (two) times daily. 180 tablet 3   tamsulosin (FLOMAX) 0.4 MG CAPS capsule Take 0.4 mg by mouth daily.     No current facility-administered medications for this encounter.    No Known Allergies    Social History   Socioeconomic History   Marital status: Single    Spouse name: Not on file   Number of children: 0   Years of education: Not on file   Highest education level: Professional school degree (e.g., MD, DDS, DVM, JD)  Occupational History   Occupation: garage owner/corporate pilot  Tobacco Use   Smoking status: Former    Current packs/day: 0.00    Average packs/day: 1 pack/day for 20.0 years (20.0 ttl pk-yrs)    Types: Cigarettes    Start date: 10/31/1974    Quit date: 10/31/1994    Years since quitting: 29.0   Smokeless tobacco: Never  Vaping Use   Vaping status: Never Used  Substance and Sexual Activity   Alcohol use: No    Alcohol/week: 0.0 standard drinks of alcohol   Drug use: No   Sexual activity: Yes  Other Topics Concern   Not on file  Social History Narrative   Lives in [place].  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  Also flies airplanes   Social  Drivers of Health   Financial Resource Strain: Low Risk  (09/25/2023)   Overall Financial Resource Strain (CARDIA)    Difficulty of Paying Living Expenses: Not hard at all  Food Insecurity: No Food Insecurity (09/25/2023)   Hunger Vital Sign    Worried About Running Out of Food in the Last Year: Never true    Ran Out of Food in the  Last Year: Never true  Transportation Needs: No Transportation Needs (09/25/2023)   PRAPARE - Administrator, Civil Service (Medical): No    Lack of Transportation (Non-Medical): No  Physical Activity: Sufficiently Active (09/25/2023)   Exercise Vital Sign    Days of Exercise per Week: 3 days    Minutes of Exercise per Session: 80 min  Stress: No Stress Concern Present (09/25/2023)   [employer] of Occupational Health - Occupational Stress Questionnaire    Feeling of Stress : Not at all  Social Connections: Socially Isolated (09/25/2023)   Social Connection and Isolation Panel [NHANES]    Frequency of Communication with Friends and Family: More than three times a week    Frequency of Social Gatherings with Friends and Family: More than three times a week    Attends Religious Services: Never    Database Administrator or Organizations: No    Attends Engineer, Structural: Not on file    Marital Status: Never married  Intimate Partner Violence: Unknown (02/04/2022)   Received from [employer], [facility]   HITS    Physically Hurt: Not on file    Insult or Talk Down To: Not on file    Threaten Physical Harm: Not on file    Scream or Curse: Not on file      Family History  Adopted: Yes    Vitals:   11/09/23 0908 11/09/23 0917  BP:  110/70  Pulse:  67  SpO2:  98%  Weight: 89 kg (196 lb 3.2 oz) 89 kg (196 lb 3.2 oz)    PHYSICAL EXAM: General:  Well appearing. No respiratory difficulty HEENT: normal Neck: supple. no JVD. Carotids 2+ bilat; no bruits. No lymphadenopathy or thryomegaly appreciated. Cor: PMI  nondisplaced. Regular rate & rhythm. No rubs, gallops or murmurs. Lungs: clear Abdomen: soft, nontender, nondistended. No hepatosplenomegaly. No bruits or masses. Good bowel sounds. Extremities: no cyanosis, clubbing, rash, edema Neuro: alert & oriented x 3, cranial nerves grossly intact. moves all 4 extremities w/o difficulty. Affect pleasant.  ECG: NSR 70 LVH with anterolateral Qs Personally reviewed    ASSESSMENT & PLAN:  1. Chronic systolic CHF:  - due to iCM - Echo 4/24 EF 25-30% - Hard to assess NYHA class fully. In gym seems to be NYHA I-II but then says he will get SOB with showering. - Volume status ok  - Has had trouble tolerating GDMT due to side effects - We discussed the evidence behind GDMT optimization as well as the concept of maximally-tolerated GDMT - Continue carvedilol  12.5 bid (not taking on mornings he goes to gym) - Continue Entresto  24/26 bid (failed higher doses) - Restart Jardiance  10  - At next visit can consider low-dose MRA (previously intolerant of aldactone  25 mg daily due to depression) - has refused ICD - We will plan CPX testing to get clearer understanding of his functional capacity and potential HF limitation   2. CAD:  - s/p CABG 2000. Known atretic LIMA to LAD but other grafts patent by cath in 2015. Prior Myoview  studies have been abnormal so I don't think would be very helpful to assess circulation now.  - no s/s angina   - not on ASA/statin per his choice - followed by Dr. Jordan   3.  Paroxysmal Afib - remains on amio  - regular rhythm today - CHAD vasc score of 4- patient stopped taking Eliquis  on his own due to concern about bruising.    4.  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   - Last TSH improved and T4 normal.  - Follow routine labs    Toribio Fuel, MD  9:57 AM

## 2023-12-11 ENCOUNTER — Other Ambulatory Visit: Payer: Self-pay | Admitting: Internal Medicine

## 2023-12-11 DIAGNOSIS — J300 Vasomotor rhinitis: Secondary | ICD-10-CM

## 2023-12-18 NOTE — Telephone Encounter (Signed)
 Forwarding to [place] to re-verify benefits

## 2023-12-21 ENCOUNTER — Ambulatory Visit: Payer: Medicare Other | Admitting: Internal Medicine

## 2023-12-21 ENCOUNTER — Telehealth: Payer: Self-pay | Admitting: Internal Medicine

## 2023-12-21 ENCOUNTER — Encounter: Payer: Self-pay | Admitting: Internal Medicine

## 2023-12-21 VITALS — BP 126/81 | HR 60 | Temp 97.5°F | Ht 73.5 in | Wt 193.8 lb

## 2023-12-21 DIAGNOSIS — J849 Interstitial pulmonary disease, unspecified: Secondary | ICD-10-CM

## 2023-12-21 DIAGNOSIS — I5189 Other ill-defined heart diseases: Secondary | ICD-10-CM

## 2023-12-21 DIAGNOSIS — Z9229 Personal history of other drug therapy: Secondary | ICD-10-CM

## 2023-12-21 DIAGNOSIS — R0609 Other forms of dyspnea: Secondary | ICD-10-CM

## 2023-12-21 DIAGNOSIS — J84112 Idiopathic pulmonary fibrosis: Secondary | ICD-10-CM

## 2023-12-21 DIAGNOSIS — J329 Chronic sinusitis, unspecified: Secondary | ICD-10-CM

## 2023-12-21 NOTE — Patient Instructions (Addendum)
 ICD-10-CM   1. DOE (dyspnea on exertion)  R06.09     2. ILD (interstitial lung disease) (HCC)  J84.9     3. IPF (idiopathic pulmonary fibrosis) (HCC)  J84.112     4. Chronic systolic dysfunction of right ventricle  I51.89     5. History of amiodarone therapy  Z92.29       #Interstitial lung disease  Based on radiologic evidence you had ILD in 2019 [CT scan abdomen with the lung image].  Currently in December 2024 it is definitely progressed and there is evidence of honeycombing.  I am provisionally giving her diagnosis of idiopathic pulmonary fibrosis as the specific type of pulmonary fibrosis.  This is based on the fact that you have honeycombing, you have progressed, age greater than 23, male gender, Caucasian ethnicity.  The amiodarone has been a risk factor.  Other types of ILD include hypersensitive pneumonitis.  Plan - Please talk to cardiology and see if an alternative for amiodarone could be found - Definitely indicated is pulmonary antifibrotic  -Recommending pirfenidone [AKA's.]  Per protocol  -Holding nintedanib second line in case pirfenidone is problematic [this because of cardiac issues]. -I will discuss your case with the case conference in the next few months - Do blood work for autoimmune disease -Do pulmonary function test  #Chronic sinusitis and chronic sinus drainage  Plan - Get CT scan of the sinus without contrast - Get RAST allergy panel

## 2023-12-21 NOTE — Telephone Encounter (Signed)
 LM for PT to call to sched PFT. Will send letter.,

## 2023-12-21 NOTE — Telephone Encounter (Signed)
 Front desk   Alcus Dad -for cardiac follow-up.  Please give him follow-up with PFT testing in approximately 7 weeks

## 2023-12-21 NOTE — Progress Notes (Signed)
 OV 12/21/2023  Subjective:  Patient ID: Keith Vazquez, male , DOB: [birth year] , age 73 y.o. , MRN: [id number] , ADDRESS: [address] [place] [facility] [id number] PCP Etta Grandchild, MD Patient Care Team: Etta Grandchild, MD as PCP - General (Internal Medicine) [place], Peter M, MD as PCP - Cardiology (Cardiology) Hillis Range, MD (Inactive) as PCP - Electrophysiology (Cardiology) Barrett, Shawn Stall as Physician Assistant (Cardiology) Szabat, Vinnie Level, [facility] (Inactive) as Pharmacist (Pharmacist)  This Provider for this visit: Treatment Team:  Attending Provider: Kalman Shan, MD    12/21/2023 -   Chief Complaint  Patient presents with   Follow-up     HPI Keith Vazquez 73 y.o. 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  He says in year 2000 he was diagnosed with chronic systolic dysfunction with ejection fraction 40%.  He is states that he never had dyspnea on exertion with that.  He used to workout regularly.  Then sometime around 20 10-2019 he started getting worsening shortness of breath and then he saw cardiology PA and was placed on carvedilol that improved his shortness of breath 80% and then he was stable for a few years but then shortness of breath started getting worse.  Cardiology increased his carvedilol but without much change.  He is since then felt that he has had an extra cause for dyspnea and he says he has been pursuing this.  He has had worsening dyspnea on exertion relieved by rest for at least 10 years.  He definitely feels it is undue dyspnea excess of his chronic systolic dysfunction.  Some 5 years ago he was started on amiodarone and this is for atrial fibrillation and he is in sinus rhythm.  He has had 1 ablation.  He says cardiology will not do another ablation but he feels it is indicated.  He says 1 time when he tried to come down on his amiodarone his atrial  fibrillation recurred.  He is not aware that he might have pulmonary fibrosis but he did have a CT scan of the chest recently and shows pulmonary fibrosis/ILD and therefore he is here.    [place] [facility] Comprehensive ILD Questionnaire  Symptoms:  His current symptom severity is as below. SYMPTOM SCALE - ILD 12/21/2023  Current weight   O2 use ra  Shortness of Breath 0 -> 5 scale with 5 being worst (score 6 If unable to do)  At rest 0  Simple tasks - showers, clothes change, eating, shaving 3  Household (dishes, doing bed, laundry) 3  Shopping 2  Walking level at own pace 2  Walking up Stairs 3  Total (30-36) Dyspnea Score 13      Non-dyspnea symptoms (0-> 5 scale) 12/21/2023  How bad is your cough? 0  How bad is your fatigue 1  How bad is nausea 00  How bad is vomiting?  0  How bad is diarrhea? 0  How bad is anxiety? 0  How bad is depression 00  Any chronic pain - if so where and how bad 0     Past Medical History :  -Chronic systolic dysfunction.  As far as I can see his echocardiogram from 20 15 through 20 24 all have low ejection fraction sometimes as low as 20% but mostly around 40%. -Atrial fibrillation on amiodarone x 5 years - Denies any Raynaud's or connective tissue disease. - Denies any COPD or  asthma - Denies any hepatitis denies any HIV - Denies any tuberculosis -He has had COVID vaccine but not the disease.  Never hospitalized.   ROS:  -He has significant chronic sinus drainage this is not evaluated.  Is been going on for years.  Recently primary care put him antihistamine.  With this he is 80% better but he prefers to have a workup -Shortness of breath on exertion - No Raynaud's - No acid reflux - No GI symptoms  FAMILY HISTORY of LUNG DISEASE:  -He says he is adopted.  He says he was born at [place] [facility] and he was abandoned there.  He says his adoptive family then brought him to [facility] when  he is around 73 years of age.  PERSONAL EXPOSURE HISTORY:  -Smoked between 1968 and 1987 around 20 cigarettes/day and then quit. - No marijuana - No cocaine no intravenous drug use  HOME  EXPOSURE and HOBBY DETAILS :  -Lives in a single-family home for the last 28 years.  The home itself is 73 years of age.  Detail organic antigen exposure history is negative -He does have a bird feather pillow -will have to advise him to remove this next time.  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 Jetty Duhamel was normal.  See below.  CT Chest data from date: dec 2-24  - personally visualized and independently interpreted : x - my findings are:  BELOW HRCT 10/09/23 Narrative & Impression  CLINICAL DATA:  Shortness of breath on exertion. Bilateral rales and chronic cough.   EXAM: CT CHEST WITHOUT CONTRAST   TECHNIQUE: Multidetector CT imaging of the chest was performed following the standard protocol without intravenous contrast. High resolution imaging of the lungs, as well as inspiratory and expiratory imaging, was performed.   RADIATION DOSE REDUCTION: This exam was performed according to the departmental dose-optimization program which includes automated exposure control, adjustment of the mA and/or kV according to patient size and/or use of iterative reconstruction technique.   COMPARISON:  None Available.   FINDINGS: Cardiovascular: Atherosclerotic calcification of the aorta. Enlarged pulmonic trunk and heart. No pericardial effusion.   Mediastinum/Nodes: No pathologically enlarged mediastinal or axillary lymph nodes. Hilar regions are difficult to definitively evaluate without IV contrast. Air in the  esophagus can be seen with dysmotility.   Lungs/Pleura: Predominantly peripheral and basilar predominant subpleural reticulation, traction bronchiectasis/bronchiolectasis and ground-glass. Scattered single layer honeycombing. No pleural fluid. Airway is unremarkable. Minimal air trapping.   Upper Abdomen: Increased liver density, compatible with amiodarone therapy. Tiny right renal stones. Small low-attenuation lesions in the kidneys. No specific follow-up necessary. Visualized portions of the liver, gallbladder, adrenal glands, kidneys, spleen, pancreas, stomach and bowel are otherwise grossly unremarkable. No upper abdominal adenopathy.   Musculoskeletal: Degenerative changes in the spine.   IMPRESSION: 1. Peripheral basilar predominant subpleural reticulation, traction bronchiectasis/bronchiolectasis, ground-glass and honeycombing. Findings are consistent with UIP per consensus guidelines: Diagnosis of Idiopathic Pulmonary Fibrosis: An Official ATS/ERS/JRS/ALAT Clinical Practice Guideline. Am Rosezetta Schlatter Crit Care Med Vol 198, Iss 5, [phone], Jul 01 2017. 2. Tiny right renal stones. 3.  Aortic atherosclerosis (ICD10-I70.0). 4. Enlarged pulmonic trunk, indicative of pulmonary arterial hypertension.  Electronically Signed   By: Leanna Battles M.D.   On: 10/19/2023 13:51       PFT     Latest Ref Rng & Units 07/01/2014   10:52 AM  PFT Results  FVC-Pre L 4.68   FVC-Predicted Pre % 94   FVC-Post L 4.76   FVC-Predicted Post % 96   Pre FEV1/FVC % % 80   Post FEV1/FCV % % 82   FEV1-Pre L 3.74   FEV1-Predicted Pre % 101   FEV1-Post L 3.91   DLCO uncorrected ml/min/mmHg 17.83   DLCO UNC% % 50   DLVA Predicted % 59   TLC L 6.10   TLC % Predicted % 82   RV % Predicted % 52    Simple office walk 224 (66+46 x 2) feet Pod A at [facility] x  3 laps goal with forehead probe 12/21/2023    O2 used ra   Number laps completed Sit and stnd x 15   Comments about pace x    Resting Pulse Ox/HR 99% and 61/min   Final Pulse Ox/HR 91% and 78/min   Desaturated </= 88% no   Desaturated <= 3% points Yes, 7 points   Got Tachycardic >/= 90/min no   Symptoms at end of test no   Miscellaneous comments no        LAB RESULTS last 96 hours No results found.       has a past medical history of Atrial flutter (HCC), CHF (congestive heart failure) (HCC), Coronary artery disease, Degeneration of lumbar intervertebral disc, GERD (gastroesophageal reflux disease), GI bleed, Hypertension, Ischemic heart disease, Low back pain, PAC (premature atrial contraction), PAF (paroxysmal atrial fibrillation) (HCC), PAF (paroxysmal atrial fibrillation) (HCC), and Peyronie's disease.   reports that he quit smoking about 29 years ago. His smoking use included cigarettes. He started smoking about 49 years ago. He has a 20 pack-year smoking history. He has never used smokeless tobacco.  Past Surgical History:  Procedure Laterality Date   ABLATION  05-06-2014   PVI and CTI ablation by Dr Johney Frame   ATRIAL FIBRILLATION ABLATION N/A 05/06/2014   Procedure: ATRIAL FIBRILLATION ABLATION;  Surgeon: Gardiner Rhyme, MD;  Location: [facility] CATH LAB;  Service: Cardiovascular;  Laterality: N/A;   CAPSULOTOMY Right 06/15/2022   Procedure: CAPSULOTOMY TOES 2-4;  Surgeon: Felecia Shelling, DPM;  Location: [facility] ORS;  Service: Podiatry;  Laterality: Right;   CARDIAC CATHETERIZATION  09/14/2001    Mildly decreased left ventricular systolic function --  Native three vessel coronary artery disease as described. -- Status post coronary artery bypass grafting   CORONARY ARTERY BYPASS GRAFT  2000   LIMA to LAD, SVG to second diagonal, seq SVG to ramus and OM, SVG to PDA   HAMMER TOE SURGERY Right 06/15/2022   Procedure: HAMMER TOE CORRECTION TOES 2-4;  Surgeon: Felecia Shelling, DPM;  Location: [facility] ORS;  Service: Podiatry;  Laterality: Right;   HERNIA REPAIR  05/16/2001   Large left indirect inguinal hernia with right  direct hernia.   HERNIA REPAIR     Recurrent left inguinal hernia -- Large left indirect inguinal hernia with right direct hernia.   LEFT HEART CATHETERIZATION WITH CORONARY/GRAFT ANGIOGRAM  11/26/2013   Procedure: LEFT HEART CATHETERIZATION WITH Isabel Caprice;  Surgeon: Peter M Swaziland, MD;  Location: [facility] CATH LAB;  Service: Cardiovascular;;   TEE WITHOUT CARDIOVERSION N/A 05/06/2014   Procedure: TRANSESOPHAGEAL ECHOCARDIOGRAM (TEE);  Surgeon: Lars Masson, MD;  Location: [facility] ENDOSCOPY;  Service:  Cardiovascular;  Laterality: N/A;    No Known Allergies  Immunization History  Administered Date(s) Administered   Fluad Quad(high Dose 65+) 08/23/2019, 08/19/2021   Influenza Split 09/30/2014   Influenza, High Dose Seasonal PF 09/11/2015, 09/16/2016, 09/12/2017, 09/10/2018   Influenza-Unspecified 09/14/2020, 08/15/2023   Moderna Sars-Covid-2 Vaccination 10/08/2020   PFIZER(Purple Top)SARS-COV-2 Vaccination 12/06/2019, 12/27/2019   Pneumococcal Conjugate-13 05/16/2019   Pneumococcal Polysaccharide-23 04/07/2021   Pneumococcal-Unspecified 07/22/2012   Tdap 05/16/2019   Zoster Recombinant(Shingrix) 12/14/2020   Zoster, Live 05/22/2011    Family History  Adopted: Yes     Current Outpatient Medications:    amiodarone (PACERONE) 200 MG tablet, Take 2 tablets ( 400 mg ) daily, Disp: 180 tablet, Rfl: 3   Ascorbic Acid (VITAMIN C) 1000 MG tablet, Take 1,000 mg by mouth daily., Disp: , Rfl:    azelastine (ASTELIN) 0.1 % nasal spray, USE ONE SPRAY into BOTH nostrils TWO (two) times daily. USE in each nostril as directed], Disp: 30 mL, Rfl: 1   carvedilol (COREG) 12.5 MG tablet, Take 6.25 mg by mouth in the morning. 12.5 mg in the evening, Disp: , Rfl:    cholecalciferol (VITAMIN D3) 25 MCG (1000 UNIT) tablet, Take 1,000 Units by mouth daily., Disp: , Rfl:    dicyclomine (BENTYL) 10 MG capsule, Take 10 mg by mouth 4 (four) times daily as needed., Disp: , Rfl:    empagliflozin  (JARDIANCE) 10 MG TABS tablet, Take 1 tablet (10 mg total) by mouth daily before breakfast., Disp: 90 tablet, Rfl: 1   NURTEC 75 MG TBDP, Take 75 mg by mouth as needed (The onset of a migraines)., Disp: , Rfl:    sacubitril-valsartan (ENTRESTO) 24-26 MG, Take 1 tablet by mouth 2 (two) times daily., Disp: 180 tablet, Rfl: 3   tamsulosin (FLOMAX) 0.4 MG CAPS capsule, Take 0.4 mg by mouth daily., Disp: , Rfl:       Objective:   Vitals:   12/21/23 1453  BP: 126/81  Pulse: 60  Temp: (!) 97.5 F (36.4 C)  TempSrc: Oral  SpO2: 97%  Weight: 193 lb 12.8 oz (87.9 kg)  Height: 6' 1.5" (1.867 m)    Estimated body mass index is 25.22 kg/m as calculated from the following:   Height as of this encounter: 6' 1.5" (1.867 m).   Weight as of this encounter: 193 lb 12.8 oz (87.9 kg).  @WEIGHTCHANGE @  [employer]   12/21/23 1453  Weight: 193 lb 12.8 oz (87.9 kg)     Physical Exam   General: No distress. Looks well O2 at rest: no Cane present: no Sitting in wheel chair: no Frail: no Obese: no Neuro: Alert and Oriented x 3. GCS 15. Speech normal Psych: Pleasant Resp:  Barrel Chest - no.  Wheeze - no, Crackles - YES BASE and maytbe LUL, No overt respiratory distress CVS: Normal heart sounds. Murmurs - no Ext: Stigmata of Connective Tissue Disease - no HEENT: Normal upper airway. PEERL +. No post nasal drip        Assessment:       ICD-10-CM   1. DOE (dyspnea on exertion)  R06.09 Pulmonary function test    Antinuclear Antib (ANA)    Anti-DNA antibody, double-stranded    Rheumatoid factor    Sed Rate (ESR)    Cyclic citrul peptide antibody, IgG    QuantiFERON-TB Gold Plus    Sjogrens syndrome-A extractable nuclear antibody    Sjogrens syndrome-B extractable nuclear antibody    CK (Creatine Kinase)  Aldolase    Anti-scleroderma antibody    Hypersensitivity pnuemonitis profile    CT Maxillofacial LTD WO CM    Perennial allergen profile IgE    CBC w/Diff    2. ILD  (interstitial lung disease) (HCC)  J84.9 Pulmonary function test    Antinuclear Antib (ANA)    Anti-DNA antibody, double-stranded    Rheumatoid factor    Sed Rate (ESR)    Cyclic citrul peptide antibody, IgG    QuantiFERON-TB Gold Plus    Sjogrens syndrome-A extractable nuclear antibody    Sjogrens syndrome-B extractable nuclear antibody    CK (Creatine Kinase)    Aldolase    Anti-scleroderma antibody    Hypersensitivity pnuemonitis profile    CT Maxillofacial LTD WO CM    Perennial allergen profile IgE    CBC w/Diff    3. IPF (idiopathic pulmonary fibrosis) (HCC)  J84.112 Pulmonary function test    Antinuclear Antib (ANA)    Anti-DNA antibody, double-stranded    Rheumatoid factor    Sed Rate (ESR)    Cyclic citrul peptide antibody, IgG    QuantiFERON-TB Gold Plus    Sjogrens syndrome-A extractable nuclear antibody    Sjogrens syndrome-B extractable nuclear antibody    CK (Creatine Kinase)    Aldolase    Anti-scleroderma antibody    Hypersensitivity pnuemonitis profile    CT Maxillofacial LTD WO CM    Perennial allergen profile IgE    CBC w/Diff    4. Chronic systolic dysfunction of right ventricle  I51.89 Pulmonary function test    Antinuclear Antib (ANA)    Anti-DNA antibody, double-stranded    Rheumatoid factor    Sed Rate (ESR)    Cyclic citrul peptide antibody, IgG    QuantiFERON-TB Gold Plus    Sjogrens syndrome-A extractable nuclear antibody    Sjogrens syndrome-B extractable nuclear antibody    CK (Creatine Kinase)    Aldolase    Anti-scleroderma antibody    Hypersensitivity pnuemonitis profile    CT Maxillofacial LTD WO CM    Perennial allergen profile IgE    CBC w/Diff    5. History of amiodarone therapy  Z92.29 Pulmonary function test    Antinuclear Antib (ANA)    Anti-DNA antibody, double-stranded    Rheumatoid factor    Sed Rate (ESR)    Cyclic citrul peptide antibody, IgG    QuantiFERON-TB Gold Plus    Sjogrens syndrome-A extractable nuclear  antibody    Sjogrens syndrome-B extractable nuclear antibody    CK (Creatine Kinase)    Aldolase    Anti-scleroderma antibody    Hypersensitivity pnuemonitis profile    CT Maxillofacial LTD WO CM    Perennial allergen profile IgE    CBC w/Diff    6. Chronic sinusitis, unspecified location  J32.9 Hypersensitivity pnuemonitis profile    CT Maxillofacial LTD WO CM    Perennial allergen profile IgE    CBC w/Diff         Plan:     Patient Instructions     ICD-10-CM   1. DOE (dyspnea on exertion)  R06.09     2. ILD (interstitial lung disease) (HCC)  J84.9     3. IPF (idiopathic pulmonary fibrosis) (HCC)  J84.112     4. Chronic systolic dysfunction of right ventricle  I51.89     5. History of amiodarone therapy  Z92.29       #Interstitial lung disease  Based on radiologic evidence you had ILD in 2019 [CT scan abdomen with the  lung image].  Currently in December 2024 it is definitely progressed and there is evidence of honeycombing.  I am provisionally giving her diagnosis of idiopathic pulmonary fibrosis as the specific type of pulmonary fibrosis.  This is based on the fact that you have honeycombing, you have progressed, age greater than 55, male gender, Caucasian ethnicity.  The amiodarone has been a risk factor.  Other types of ILD include hypersensitive pneumonitis.  Plan - Please talk to cardiology and see if an alternative for amiodarone could be found - Definitely indicated is pulmonary antifibrotic  -Recommending pirfenidone [AKA's.]  Per protocol  -Holding nintedanib second line in case pirfenidone is problematic [this because of cardiac issues]. -I will discuss your case with the case conference in the next few months - Do blood work for autoimmune disease -Do pulmonary function test  #Chronic sinusitis and chronic sinus drainage  Plan - Get CT scan of the sinus without contrast - Get RAST allergy panel   FOLLOWUP Return in about 7 weeks (around 02/08/2024)  for 30 min visit, with Dr Marchelle Gearing, Face to Face Visit.  ( Level 05 visit E&M 2024:  New >= 60 min   in  visit type: on-site physical face to visit  in total care time and counseling or/and coordination of care by this undersigned MD - Dr Kalman Shan. This includes one or more of the following on this same day 12/21/2023: pre-charting, chart review, note writing, documentation discussion of test results, diagnostic or treatment recommendations, prognosis, risks and benefits of management options, instructions, education, compliance or risk-factor reduction. It excludes time spent by the CMA or office staff in the care of the patient. Actual time 60 min)   SIGNATURE    Dr. Kalman Shan, M.D., F.C.C.P,  Pulmonary and Critical Care Medicine Staff Physician, [facility] Director - Interstitial Lung Disease  Program  Pulmonary Fibrosis [facility] at [facility] [place], [place], [address]  Pager: [phone], If no answer or between  15:00h - 7:00h: call [phone] Telephone: [phone]  3:58 PM 12/21/2023

## 2023-12-22 ENCOUNTER — Telehealth: Payer: Self-pay | Admitting: Internal Medicine

## 2023-12-22 DIAGNOSIS — J329 Chronic sinusitis, unspecified: Secondary | ICD-10-CM

## 2023-12-22 LAB — CBC WITH DIFFERENTIAL/PLATELET
Basophils Absolute: 0.1 10*3/uL (ref 0.0–0.1)
Basophils Relative: 1 % (ref 0.0–3.0)
Eosinophils Absolute: 0.2 10*3/uL (ref 0.0–0.7)
Eosinophils Relative: 1.8 % (ref 0.0–5.0)
HCT: 45.6 % (ref 39.0–52.0)
Hemoglobin: 15.2 g/dL (ref 13.0–17.0)
Lymphocytes Relative: 12 % (ref 12.0–46.0)
Lymphs Abs: 1.1 10*3/uL (ref 0.7–4.0)
MCHC: 33.4 g/dL (ref 30.0–36.0)
MCV: 93.6 fL (ref 78.0–100.0)
Monocytes Absolute: 0.6 10*3/uL (ref 0.1–1.0)
Monocytes Relative: 6 % (ref 3.0–12.0)
Neutro Abs: 7.4 10*3/uL (ref 1.4–7.7)
Neutrophils Relative %: 79.2 % — ABNORMAL HIGH (ref 43.0–77.0)
Platelets: 236 10*3/uL (ref 150.0–400.0)
RBC: 4.87 Mil/uL (ref 4.22–5.81)
RDW: 14.5 % (ref 11.5–15.5)
WBC: 9.4 10*3/uL (ref 4.0–10.5)

## 2023-12-22 LAB — CK: Total CK: 161 U/L (ref 7–232)

## 2023-12-22 LAB — SEDIMENTATION RATE: Sed Rate: 41 mm/h — ABNORMAL HIGH (ref 0–20)

## 2023-12-22 NOTE — Telephone Encounter (Signed)
 [facility] states that CT needs to have "limited' removed. They do not do that.

## 2023-12-22 NOTE — Telephone Encounter (Signed)
 CT order was updated to CT sinus without  Nothing further needed

## 2023-12-24 LAB — ALLERGEN PROFILE, PERENNIAL ALLERGEN IGE

## 2023-12-28 LAB — QUANTIFERON-TB GOLD PLUS
Mitogen-NIL: 6.96 [IU]/mL
NIL: 0.05 [IU]/mL
QuantiFERON-TB Gold Plus: NEGATIVE
TB1-NIL: <0 [IU]/mL
TB2-NIL: <0 [IU]/mL

## 2023-12-28 LAB — ANTI-NUCLEAR AB-TITER (ANA TITER): ANA Titer 1: 1:80 {titer} — ABNORMAL HIGH

## 2023-12-28 LAB — HYPERSENSITIVITY PNUEMONITIS PROFILE
ASPERGILLUS FUMIGATUS: NEGATIVE
Faenia retivirgula: NEGATIVE
Pigeon Serum: NEGATIVE
S. VIRIDIS: NEGATIVE
T. CANDIDUS: NEGATIVE
T. VULGARIS: NEGATIVE

## 2023-12-28 LAB — SJOGRENS SYNDROME-A EXTRACTABLE NUCLEAR ANTIBODY: SSA (Ro) (ENA) Antibody, IgG: <1 AI

## 2023-12-28 LAB — ALDOLASE: Aldolase: 4.5 U/L (ref <=8.1)

## 2023-12-28 LAB — ANTI-DNA ANTIBODY, DOUBLE-STRANDED: ds DNA Ab: <1 [IU]/mL

## 2023-12-28 LAB — SJOGRENS SYNDROME-B EXTRACTABLE NUCLEAR ANTIBODY: SSB (La) (ENA) Antibody, IgG: <1 AI

## 2023-12-28 LAB — ANTI-SCLERODERMA ANTIBODY: Scleroderma (Scl-70) (ENA) Antibody, IgG: <1 AI

## 2023-12-28 LAB — RHEUMATOID FACTOR: Rheumatoid fact SerPl-aCnc: <10 [IU]/mL (ref <14)

## 2023-12-28 LAB — ANA: Anti Nuclear Antibody (ANA): POSITIVE — AB

## 2023-12-28 LAB — CYCLIC CITRUL PEPTIDE ANTIBODY, IGG: Cyclic Citrullin Peptide Ab: <16 U

## 2023-12-29 ENCOUNTER — Other Ambulatory Visit: Payer: Self-pay | Admitting: Internal Medicine

## 2024-01-17 ENCOUNTER — Ambulatory Visit
Admission: RE | Admit: 2024-01-17 | Discharge: 2024-01-17 | Disposition: A | Discharge status: Home / Self Care | Payer: BLUE CROSS/BLUE SHIELD | Source: Ambulatory Visit | Attending: Internal Medicine | Admitting: Internal Medicine

## 2024-01-17 DIAGNOSIS — J329 Chronic sinusitis, unspecified: Secondary | ICD-10-CM

## 2024-01-29 ENCOUNTER — Encounter (HOSPITAL_COMMUNITY): Payer: Self-pay | Admitting: Internal Medicine

## 2024-02-02 ENCOUNTER — Encounter (HOSPITAL_COMMUNITY): Payer: Self-pay | Admitting: Internal Medicine

## 2024-02-02 ENCOUNTER — Ambulatory Visit (HOSPITAL_COMMUNITY)
Admission: RE | Admit: 2024-02-02 | Discharge: 2024-02-02 | Disposition: A | Discharge status: Home / Self Care | Source: Ambulatory Visit | Attending: Internal Medicine | Admitting: Internal Medicine

## 2024-02-02 VITALS — BP 130/80 | HR 72 | Wt 190.6 lb

## 2024-02-02 DIAGNOSIS — E0580 Other thyrotoxicosis without thyrotoxic crisis or storm: Secondary | ICD-10-CM | POA: Diagnosis not present

## 2024-02-02 DIAGNOSIS — Z79899 Other long term (current) drug therapy: Secondary | ICD-10-CM | POA: Insufficient documentation

## 2024-02-02 DIAGNOSIS — Z87891 Personal history of nicotine dependence: Secondary | ICD-10-CM | POA: Diagnosis not present

## 2024-02-02 DIAGNOSIS — Z7901 Long term (current) use of anticoagulants: Secondary | ICD-10-CM | POA: Diagnosis not present

## 2024-02-02 DIAGNOSIS — I480 Paroxysmal atrial fibrillation: Secondary | ICD-10-CM | POA: Diagnosis not present

## 2024-02-02 DIAGNOSIS — I5022 Chronic systolic (congestive) heart failure: Secondary | ICD-10-CM | POA: Insufficient documentation

## 2024-02-02 DIAGNOSIS — Z951 Presence of aortocoronary bypass graft: Secondary | ICD-10-CM | POA: Diagnosis not present

## 2024-02-02 DIAGNOSIS — I2510 Atherosclerotic heart disease of native coronary artery without angina pectoris: Secondary | ICD-10-CM | POA: Diagnosis not present

## 2024-02-02 DIAGNOSIS — E785 Hyperlipidemia, unspecified: Secondary | ICD-10-CM

## 2024-02-02 DIAGNOSIS — T462X5D Adverse effect of other antidysrhythmic drugs, subsequent encounter: Secondary | ICD-10-CM | POA: Insufficient documentation

## 2024-02-02 DIAGNOSIS — T462X5A Adverse effect of other antidysrhythmic drugs, initial encounter: Secondary | ICD-10-CM

## 2024-02-02 MED ORDER — EPLERENONE 25 MG PO TABS: 12.5000 mg | ORAL_TABLET | Freq: Every day | ORAL | 6 refills | Status: DC

## 2024-02-02 NOTE — Patient Instructions (Signed)
 Great to see you today!!!  Medication Changes:  START Epleronone 12.5 mg (1/2 tab) Daily  Lab Work:  Your physician recommends that you return for lab work in: 2 weeks  Testing/Procedures:  Your physician has requested that you have an echocardiogram. Echocardiography is a painless test that uses sound waves to create images of your heart. It provides your doctor with information about the size and shape of your heart and how well your heart's chambers and valves are working. This procedure takes approximately one hour. There are no restrictions for this procedure. Please do NOT wear cologne, perfume, aftershave, or lotions (deodorant is allowed). Please arrive 15 minutes prior to your appointment time.  Please note: We ask at that you not bring children with you during ultrasound (echo/ vascular) testing. Due to room size and safety concerns, children are not allowed in the ultrasound rooms during exams. Our front office staff cannot provide observation of children in our lobby area while testing is being conducted. An adult accompanying a patient to their appointment will only be allowed in the ultrasound room at the discretion of the ultrasound technician under special circumstances. We apologize for any inconvenience.   Referrals:  You have been referred to [facility] EP, they will call you for an appointment  Special Instructions // Education:  Do the following things EVERYDAY: Weigh yourself in the morning before breakfast. Write it down and keep it in a log. Take your medicines as prescribed Eat low salt foods--Limit salt (sodium) to 2000 mg per day.  Stay as active as you can everyday Limit all fluids for the day to less than 2 liters   Follow-Up in: 6 months (October), **PLEASE CALL OUR OFFICE IN AUGUST TO SCHEDULE THIS APPOINTMENT   At the [facility], you and your health needs are our priority. We have a designated team specialized in the treatment of Heart  Failure. This Care Team includes your primary Heart Failure Specialized Cardiologist (physician), Advanced Practice Providers (APPs- Physician Assistants and Nurse Practitioners), and Pharmacist who all work together to provide you with the care you need, when you need it.   You may see any of the following providers on your designated Care Team at your next follow up:  Dr. Arvilla Meres Dr. Marca Ancona Dr. Dorthula Nettles Dr. Theresia Bough Tonye Becket, NP Robbie Lis, [place] [facility] [place], [place] Brynda Peon, NP [place] Lee, NP Karle Plumber, PharmD   Please be sure to bring in all your medications bottles to every appointment.   Need to Contact us:  If you have any questions or concerns before your next appointment please send [place] a message through [place] or call our office at [phone].    TO LEAVE A MESSAGE FOR THE NURSE SELECT OPTION 2, PLEASE LEAVE A MESSAGE INCLUDING: YOUR NAME DATE OF BIRTH CALL BACK NUMBER REASON FOR CALL**this is important as we prioritize the call backs  YOU WILL RECEIVE A CALL BACK THE SAME DAY AS LONG AS YOU CALL BEFORE 4:00 PM

## 2024-02-02 NOTE — Progress Notes (Signed)
 ADVANCED HF CLINIC CONSULT NOTE  Referring Physician: Etta Grandchild, MD Primary Care: Etta Grandchild, MD Primary Cardiologist: Peter Swaziland, MD  Chief Complaint: Heart failure  HPI:  Keith Vazquez is a 73 y.o. male with h/o CAD s/p CABG, PAF s/p ablation and systolic HF referred by Dr. Swaziland for further evaluation of his HF   He is s/p CABG in 2000 by Dr Donata Clay. Cardiac caths in 2002 and 2015 showed atretic LIMA to the LAD which filled by the SVG-D2. SVG sequential to RI and OM, and the SVG to PDA were all patent. Myoview study in April 2016 was felt to be low risk. He had an ETT in June 2017 with no ischemia at 13 METs. Myoview in 2019 showed inferior scar without ischemia and EF 30%. Echo in May 2021 showed EF down to 25-30%. He was seen by Dr. Johney Frame and declined ICD - partially due to the fact that he was a Control and instrumentation engineer.  In January of 2018 he was started on Entresto- reported intolerance of higher doses.  He states he could not tolerate aldactone due to severe depression and quit taking it.      He is status post afib ablation 05/05/14. He has an Apple watch which he uses to track these episodes.  He  wore an event monitor in 2021 which confirmed he is having paroxysms of atrial fibrillation.  The burden was 2-3%. He was seen by Dr Lalla Brothers in 2021. His note indicates complete discussions about AAD therapy, anticoagulation, repeat ablation, and ICD. Patient did not want to consider ICD. Dr Lalla Brothers felt that with low Afib burden the best thing to do was to monitor for now unless Afib burden increases significantly. Ultimately put on amio and Eliquis. He ended up stopping Eliquis due to concerns of bruising. Developed amio-induced thyrotoxcosis which was treated    From a HF perspective, GDMT relatively limited due to intolerances. Encourage retrial of Jardiance but he did not want to take.  He states he did develop a tremor and he ended up resuming Coreg with improvement in this.   He states he quit taking Entresto because he couldn't see a change with it and his EF did not change.He quit taking a diuretic because this made him feel drained.  After some back and forth, he is currently on Entresto 24/26 bid and carvedilol 12.5 bid   Echo 02/10/23 EF 30-35%   Has seen Dr. Marchelle Gearing and had Hi-res CT 12/24  Peripheral basilar predominant subpleural reticulation, traction bronchiectasis/bronchiolectasis, ground-glass and honeycombing.  Here is here for f/u. He is very focused and on discussing his AF and wanting a repeat ablation so he can come off his amiodarone which he feels is hurting his lungs. He has not had any recent AF on amio but simply wants to get off the medication. He recently dropped the dose from 400 to 220 daily. He describes his visit with Dr. Lalla Brothers in 2021 to discuss options including repeat ablation as the worst encounter he has ever had and says he wasn't listened to. From a HF perspective says he overall feels pretty good but remains SOB at times but feels his breathing is better after decreasing his amio He does 40 mins cardio at [employer] every other day withoutt too much difficulty Denies, CP, edema, orthopnea or PND.    Past Medical History:  Diagnosis Date   Atrial flutter (HCC)    CHF (congestive heart failure) (HCC)  LVEF at time of cath 2002 50%; now has been 35-40% since 2008 (patient has seen multiple cardiologists over the years including [facility], [place], [facility], [facility] and Viann Fish, MD)   Coronary artery disease    CABG x5 in 2000; s/p cath 2002 > 4/5 grafts patent, LVEF 50%   Degeneration of lumbar intervertebral disc    GERD (gastroesophageal reflux disease)    GI bleed    duodenal ulcer   Hypertension    Borderline   Ischemic heart disease    s/p MI, CABG 2000, s/p cath 2002 with 4/5 grafts patent (LIMA to LAD atretic and occluded mid vessel but adequate flow to distal LAD from  collaterals / diagonal   Low back pain    PAC (premature atrial contraction)    PAF (paroxysmal atrial fibrillation) (HCC)    initially diagnosed during stress test 2002   PAF (paroxysmal atrial fibrillation) (HCC)    Peyronie's disease     Current Outpatient Medications  Medication Sig Dispense Refill   amiodarone (PACERONE) 200 MG tablet Take 200 mg by mouth daily.     Ascorbic Acid (VITAMIN C) 1000 MG tablet Take 1,000 mg by mouth daily.     Azelastine HCl 0.15 % SOLN Place 1 spray into the nose daily.     carvedilol (COREG) 12.5 MG tablet Take 6.25 mg by mouth in the morning. 12.5 mg in the evening     cholecalciferol (VITAMIN D3) 25 MCG (1000 UNIT) tablet Take 1,000 Units by mouth daily.     dicyclomine (BENTYL) 10 MG capsule Take 10 mg by mouth 4 (four) times daily as needed.     NURTEC 75 MG TBDP Take 75 mg by mouth as needed (The onset of a migraines).     sacubitril-valsartan (ENTRESTO) 24-26 MG Take 1 tablet by mouth 2 (two) times daily. 180 tablet 3   tamsulosin (FLOMAX) 0.4 MG CAPS capsule Take 0.4 mg by mouth daily.     empagliflozin (JARDIANCE) 10 MG TABS tablet Take 1 tablet (10 mg total) by mouth daily before breakfast. (Patient not taking: Reported on 02/02/2024) 90 tablet 1   No current facility-administered medications for this encounter.    No Known Allergies    Social History   Socioeconomic History   Marital status: Single    Spouse name: Not on file   Number of children: 0   Years of education: Not on file   Highest education level: Professional school degree (e.g., MD, DDS, DVM, JD)  Occupational History   Occupation: garage owner/corporate pilot  Tobacco Use   Smoking status: Former    Current packs/day: 0.00    Average packs/day: 1 pack/day for 20.0 years (20.0 ttl pk-yrs)    Types: Cigarettes    Start date: 10/31/1974    Quit date: 10/31/1994    Years since quitting: 29.2   Smokeless tobacco: Never  Vaping Use   Vaping status: Never Used   Substance and Sexual Activity   Alcohol use: No    Alcohol/week: 0.0 standard drinks of alcohol   Drug use: No   Sexual activity: Yes  Other Topics Concern   Not on file  Social History Narrative   Lives in [place].  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  Also flies airplanes   Social Drivers of Health   Financial Resource Strain: Low Risk  (09/25/2023)   Overall Financial Resource Strain (CARDIA)    Difficulty of Paying Living Expenses: Not hard at all  Food Insecurity: No Food Insecurity (09/25/2023)   Hunger Vital Sign    Worried About Running Out of Food in the Last Year: Never true    Ran Out of Food in the Last Year: Never true  Transportation Needs: No Transportation Needs (09/25/2023)   PRAPARE - Administrator, Civil Service (Medical): No    Lack of Transportation (Non-Medical): No  Physical Activity: Sufficiently Active (09/25/2023)   Exercise Vital Sign    Days of Exercise per Week: 3 days    Minutes of Exercise per Session: 80 min  Stress: No Stress Concern Present (09/25/2023)   [employer] of Occupational Health - Occupational Stress Questionnaire    Feeling of Stress : Not at all  Social Connections: Socially Isolated (09/25/2023)   Social Connection and Isolation Panel [NHANES]    Frequency of Communication with Friends and Family: More than three times a week    Frequency of Social Gatherings with Friends and Family: More than three times a week    Attends Religious Services: Never    Database administrator or Organizations: No    Attends Engineer, structural: Not on file    Marital Status: Never married  Intimate Partner Violence: Unknown (02/04/2022)   Received from [employer], [facility]   HITS    Physically Hurt: Not on file    Insult or Talk Down To: Not on file    Threaten Physical Harm: Not on file    Scream or Curse: Not on file      Family History  Adopted: Yes    Vitals:   02/02/24 1040  BP:  130/80  Pulse: 72  SpO2: 98%  Weight: 86.5 kg (190 lb 9.6 oz)    PHYSICAL EXAM: General:  Well appearing. No resp difficulty HEENT: normal Neck: supple. no JVD. Carotids 2+ bilat; no bruits. No lymphadenopathy or thryomegaly appreciated. Cor: PMI nondisplaced. Regular rate & rhythm. No rubs, gallops or murmurs. Lungs: clear Abdomen: soft, nontender, nondistended. No hepatosplenomegaly. No bruits or masses. Good bowel sounds. Extremities: no cyanosis, clubbing, rash, edema Neuro: alert & orientedx3, cranial nerves grossly intact. moves all 4 extremities w/o difficulty. Affect pleasant   ECG: NSR 70 LVH with anterolateral Qs Personally reviewed   ASSESSMENT & PLAN:  1. Chronic systolic CHF:  - due to iCM - Echo 4/24 EF 25-30% - NYHA II - Volume status ok  - Has had trouble tolerating GDMT due to side effects - We discussed the evidence behind GDMT optimization as well as the concept of "maximally-tolerated" GDMT - Continue carvedilol 12.5 bid (not taking on mornings he goes to gym) - Continue Entresto 24/26 bid (failed higher doses) - Has been unable to tolerate Jardiance due to tremo/jitteriness  - Willing to trial MRA. Add eplerenone 12.5  - Continue to refuse ICD currently  2. CAD:  - s/p CABG 2000. Known atretic LIMA to LAD but other grafts patent by cath in 2015. Prior Myoview studies have been abnormal so I don't think would be very helpful to assess circulation now.  - no s/s angian - refuses ASA/statin - followed by Dr. Swaziland   3.  Paroxysmal Afib - remains in NSR on amio. Dose recently dropped to 200 daily - [place] vasc score of 4- patient stopped taking Eliquis on his own due to concern about bruising.  - As above he is very focused on getting off amio and wants to be considered for another ablation. He discussed this with Dr.  Lalla Brothers in the past and he says he felt as if he wasn't listened to. I reviewed the note from this encounter on 08/13/20 personally and it  appears there was a well-balanced consideration of the options and Dr. Lalla Brothers advised him that he "did not think a repeat ablation was the best strategy at this time given his overall low burden and his inconsistent anticoagulation use"  - I understand his desire to get off amiodarone though I do not think his lung CT is necessarily a reflection of amio lung toxicity, I have advised him that it would be reasonable to go to a tertiary center to seek another opinion on a repeat ablation. May be reasonable strategy to place ILR and stop amio and surveil for AF recurrence before considering any further procedures. We will refer him to [facility] EP.   4. 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   - Last TSH improved and T4 normal in 12/24. Personally reviewed  I spent a total of 40 minutes today: 1) reviewing the patient's medical records including previous charts, labs and recent notes from other providers; 2) examining the patient and counseling them on their medical issues/explaining the plan of care; 3) adjusting meds as needed and 4) ordering lab work or other needed tests.      Arvilla Meres, MD  11:11 AM

## 2024-02-05 ENCOUNTER — Ambulatory Visit (HOSPITAL_BASED_OUTPATIENT_CLINIC_OR_DEPARTMENT_OTHER): Payer: BLUE CROSS/BLUE SHIELD | Admitting: Internal Medicine

## 2024-02-05 DIAGNOSIS — J84112 Idiopathic pulmonary fibrosis: Secondary | ICD-10-CM

## 2024-02-05 DIAGNOSIS — I5189 Other ill-defined heart diseases: Secondary | ICD-10-CM

## 2024-02-05 DIAGNOSIS — Z9229 Personal history of other drug therapy: Secondary | ICD-10-CM

## 2024-02-05 DIAGNOSIS — J849 Interstitial pulmonary disease, unspecified: Secondary | ICD-10-CM

## 2024-02-05 DIAGNOSIS — R0609 Other forms of dyspnea: Secondary | ICD-10-CM

## 2024-02-05 LAB — PULMONARY FUNCTION TEST
DL/VA % pred: 67 %
DL/VA: 2.65 ml/min/mmHg/L
DLCO cor % pred: 41 %
DLCO cor: 11.13 ml/min/mmHg
DLCO unc % pred: 42 %
DLCO unc: 11.31 ml/min/mmHg
FEF 25-75 Post: 2.69 L/s
FEF 25-75 Pre: 2.38 L/s
FEF2575-%Change-Post: 12 %
FEF2575-%Pred-Post: 111 %
FEF2575-%Pred-Pre: 98 %
FEV1-%Change-Post: 0 %
FEV1-%Pred-Post: 79 %
FEV1-%Pred-Pre: 78 %
FEV1-Post: 2.64 L
FEV1-Pre: 2.62 L
FEV1FVC-%Change-Post: -3 %
FEV1FVC-%Pred-Pre: 111 %
FEV6-%Change-Post: 4 %
FEV6-%Pred-Post: 78 %
FEV6-%Pred-Pre: 74 %
FEV6-Post: 3.36 L
FEV6-Pre: 3.23 L
FEV6FVC-%Pred-Post: 106 %
FEV6FVC-%Pred-Pre: 106 %
FVC-%Change-Post: 4 %
FVC-%Pred-Post: 73 %
FVC-%Pred-Pre: 70 %
FVC-Post: 3.36 L
FVC-Pre: 3.23 L
Post FEV1/FVC ratio: 78 %
Post FEV6/FVC ratio: 100 %
Pre FEV1/FVC ratio: 81 %
Pre FEV6/FVC Ratio: 100 %
RV % pred: 53 %
RV: 1.43 L
TLC % pred: 63 %
TLC: 4.72 L

## 2024-02-05 NOTE — Progress Notes (Signed)
 Full PFT Performed Today

## 2024-02-05 NOTE — Patient Instructions (Signed)
 Full PFT Performed Today

## 2024-02-05 NOTE — Progress Notes (Signed)
 Ref for EP at [facility], ins, demographics, and OV note all faxed to [facility] at [phone]

## 2024-02-05 NOTE — Addendum Note (Signed)
 Encounter addended by: Noralee Space, RN on: 02/05/2024 3:05 PM  Actions taken: Order list changed, Diagnosis association updated, Clinical Note Signed

## 2024-02-06 ENCOUNTER — Ambulatory Visit: Payer: BLUE CROSS/BLUE SHIELD | Admitting: Internal Medicine

## 2024-02-06 ENCOUNTER — Encounter: Payer: Self-pay | Admitting: Internal Medicine

## 2024-02-06 ENCOUNTER — Telehealth: Payer: Self-pay | Admitting: Internal Medicine

## 2024-02-06 ENCOUNTER — Telehealth: Payer: Self-pay | Admitting: Pharmacist

## 2024-02-06 VITALS — BP 118/70 | HR 68 | Ht 72.0 in | Wt 205.0 lb

## 2024-02-06 DIAGNOSIS — J84112 Idiopathic pulmonary fibrosis: Secondary | ICD-10-CM | POA: Diagnosis not present

## 2024-02-06 DIAGNOSIS — Z87891 Personal history of nicotine dependence: Secondary | ICD-10-CM

## 2024-02-06 DIAGNOSIS — J329 Chronic sinusitis, unspecified: Secondary | ICD-10-CM | POA: Diagnosis not present

## 2024-02-06 DIAGNOSIS — Z5181 Encounter for therapeutic drug level monitoring: Secondary | ICD-10-CM

## 2024-02-06 NOTE — Telephone Encounter (Signed)
 Patient is new start to pirfenidone  Submitted a Prior Authorization request to [facility] for PIRFENIDONE via CoverMyMeds. Will update once we receive a response.  Key: Palma Holter

## 2024-02-06 NOTE — Telephone Encounter (Signed)
    Alcus Dad -  ordered esbriet/pirfenidone last visit in feb 2025. Do not see anything on him on that front. Please advise     SIGNATURE    Dr. Kalman Shan, M.D., F.C.C.P,  Pulmonary and Critical Care Medicine Staff Physician, [facility] Center Director - Interstitial Lung Disease  Program  Pulmonary Fibrosis [facility] at [facility] [place], [place], [address]   Pager: [phone], If no answer  -> Check AMION or Try [phone] Telephone (clinical office): [phone] Telephone (research): [phone]  10:42 AM 02/06/2024

## 2024-02-06 NOTE — Patient Instructions (Addendum)
 ICD-10-CM   1. DOE (dyspnea on exertion)  R06.09     2. ILD (interstitial lung disease) (HCC)  J84.9     3. IPF (idiopathic pulmonary fibrosis) (HCC)  J84.112     4. Chronic systolic dysfunction of right ventricle  I51.89     5. History of amiodarone therapy  Z92.29       #Interstitial lung disease  Based on radiologic evidence you had ILD in 2019 [CT scan abdomen with the lung image].  Currently in December 2024 it is definitely progressed and there is evidence of honeycombing.  I am provisionally giving her diagnosis of idiopathic pulmonary fibrosis as the specific type of pulmonary fibrosis.  This is based on the fact that you have honeycombing, you have progressed, age greater than 76, male gender, Caucasian ethnicity.  The amiodarone can be a  a risk factor for possible progression  Disease burden is mild- moderate - tendency to drop oxygen with exertion but still adequate  Plan - Definitely indicated is pulmonary antifibrotic  -Recommending pirfenidone  Per protocol; shared decision making to start this    #Chronic sinusitis and chronic sinus drainage  - RAST allergy panel negative - CT sinus results pending 01/17/24  Plan - await ct sinus results - continue sudafed but further directives from PCP Etta Grandchild, MD and ENT   #Follow-up - [facility] in 6 weeks -6-7 weeks video visit to discuss uptake with pirfenidone.-Can be with nurse practitioner or Dr. Marchelle Gearing

## 2024-02-06 NOTE — Progress Notes (Signed)
 OV 12/21/2023  Subjective:  Patient ID: Keith Vazquez, male , DOB: 1951-10-09 , age 73 y.o. , MRN: [id number] , ADDRESS: [address] [place] [facility] [address] PCP Keith Grandchild, MD Patient Care Team: Keith Grandchild, MD as PCP - General (Internal Medicine) [place], Peter M, MD as PCP - Cardiology (Cardiology) Keith Range, MD (Inactive) as PCP - Electrophysiology (Cardiology) [place] as Physician Assistant (Cardiology) Keith Vazquez, [facility] (Inactive) as Pharmacist (Pharmacist)  This Provider for this visit: Treatment Team:  Attending Provider: Kalman Shan, MD    12/21/2023 -   Chief Complaint  Patient presents with   Follow-up     HPI Keith Vazquez 73 y.o. 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  He says in year 2000 he was diagnosed with chronic systolic dysfunction with ejection fraction 40%.  He is states that he never had dyspnea on exertion with that.  He used to workout regularly.  Then sometime around 20 10-2019 he started getting worsening shortness of breath and then he saw cardiology PA and was placed on carvedilol that improved his shortness of breath 80% and then he was stable for a few years but then shortness of breath started getting worse.  Cardiology increased his carvedilol but without much change.  He is since then felt that he has had an extra cause for dyspnea and he says he has been pursuing this.  He has had worsening dyspnea on exertion relieved by rest for at least 10 years.  He definitely feels it is undue dyspnea excess of his chronic systolic dysfunction.  Some 5 years ago he was started on amiodarone and this is for atrial fibrillation and he is in sinus rhythm.  He has had 1 ablation.  He says cardiology will not do another ablation but he feels it is indicated.  He says 1 time when he tried to come down on his amiodarone his atrial  fibrillation recurred.  He is not aware that he might have pulmonary fibrosis but he did have a CT scan of the chest recently and shows pulmonary fibrosis/ILD and therefore he is here.      Past Medical History :  -Chronic systolic dysfunction.  As far as I can see his echocardiogram from 20 15 through 20 24 all have low ejection fraction sometimes as low as 20% but mostly around 40%. -Atrial fibrillation on amiodarone x 5 years - Denies any Raynaud's or connective tissue disease. - Denies any COPD or asthma - Denies any hepatitis denies any HIV - Denies any tuberculosis -He has had COVID vaccine but not the disease.  Never hospitalized.   ROS:  -He has significant chronic sinus drainage this is not evaluated.  Is been going on for years.  Recently primary care put him antihistamine.  With this he is 80% better but he prefers to have a workup -Shortness of breath on exertion - No Raynaud's - No acid reflux - No GI symptoms  FAMILY HISTORY of LUNG DISEASE:  -He says he is adopted.  He says he was born at [place] [facility] and he was abandoned there.  He says his adoptive family then brought him to [place] when he is around 73 years of age.  PERSONAL EXPOSURE HISTORY:  -Smoked between 1968 and 1987 around 20 cigarettes/day and then quit. - No marijuana - No cocaine no intravenous  drug use  HOME  EXPOSURE and HOBBY DETAILS :  -Lives in a single-family home for the last 28 years.  The home itself is 73 years of age.  Detail organic antigen exposure history is negative -He does have a bird feather pillow -will have to advise him to remove this next time.  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  See below.  CT Chest data from date: dec 2-24  - personally visualized and independently interpreted : x - my findings are:  BELOW HRCT 10/09/23 Narrative & Impression  CLINICAL DATA:  Shortness of breath on exertion. Bilateral rales and chronic cough.   EXAM: CT CHEST WITHOUT CONTRAST   TECHNIQUE: Multidetector CT imaging of the chest was performed following the standard protocol without intravenous contrast. High resolution imaging of the lungs, as well as inspiratory and expiratory imaging, was performed.   RADIATION DOSE REDUCTION: This exam was performed according to the departmental dose-optimization program which includes automated exposure control, adjustment of the mA and/or kV according to patient size and/or use of iterative reconstruction technique.   COMPARISON:  None Available.   FINDINGS: Cardiovascular: Atherosclerotic calcification of the aorta. Enlarged pulmonic trunk and heart. No pericardial effusion.   Mediastinum/Nodes: No pathologically enlarged mediastinal or axillary lymph nodes. Hilar regions are difficult to definitively evaluate without IV contrast. Air in the esophagus can be seen with dysmotility.   Lungs/Pleura: Predominantly peripheral and basilar predominant subpleural reticulation, traction bronchiectasis/bronchiolectasis and ground-glass. Scattered single layer honeycombing. No pleural fluid. Airway is unremarkable. Minimal air trapping.   Upper Abdomen: Increased liver density, compatible with amiodarone therapy. Tiny right renal stones. Small low-attenuation lesions in the kidneys. No specific follow-up necessary. Visualized portions of the liver, gallbladder, adrenal glands, kidneys, spleen, pancreas, stomach and bowel are otherwise grossly unremarkable. No upper abdominal adenopathy.   Musculoskeletal: Degenerative changes in the spine.   IMPRESSION: 1.  Peripheral basilar predominant subpleural reticulation, traction bronchiectasis/bronchiolectasis, ground-glass and honeycombing. Findings are consistent with UIP per consensus guidelines: Diagnosis of Idiopathic Pulmonary Fibrosis: An Official ATS/ERS/JRS/ALAT Clinical Practice Guideline. Am Rosezetta Schlatter Crit Care Med Vol 198, Iss 5, [phone], Jul 01 2017. 2. Tiny right renal stones. 3.  Aortic atherosclerosis (ICD10-I70.0). 4. Enlarged pulmonic trunk, indicative of pulmonary arterial hypertension.     Electronically Signed   By: Leanna Battles Vazquez.D.   On: 10/19/2023 13:51          OV 02/06/2024  Subjective:  Patient ID: Keith Vazquez, male , DOB: 09-13-1951 , age 43 y.o. , MRN: [id number] , ADDRESS: [address] [place] [facility] [address] PCP Keith Grandchild, MD Patient Care Team: Keith Grandchild, MD as PCP - General (Internal Medicine) [place], Peter M, MD as PCP - Cardiology (Cardiology) Keith Range, MD (Inactive) as PCP - Electrophysiology (Cardiology) Keith Vazquez as Physician Assistant (Cardiology) Ellin Saba, [facility] (Inactive) as Pharmacist (Pharmacist) Keith Shan, MD as Consulting Physician (Pulmonary Disease)  This Provider for this visit: Treatment Team:  Attending Provider: Kalman Shan, MD    02/06/2024 -   Chief Complaint  Patient presents with   Follow-up    PFT done 02/05/24. Breathing has improved slightly and he denies any new  co's.     Based on radiologic evidence you had ILD in 2019 [CT scan abdomen with the lung image].  Currently in December 2024 it is definitely progressed and there is evidence of honeycombing.  I am provisionally giving her diagnosis of idiopathic pulmonary fibrosis as the specific type of pulmonary fibrosis.  This is based on the fact that you have honeycombing, you have progressed, age greater than 82, male gender, Caucasian ethnicity.  The amiodarone has been a risk factor.   #Chronic systolic  dysfunction #Long history of amiodarone intake  #Esbriet/Pirfenidone requires intensive drug monitoring due to high concerns for Adverse effects of , including  Drug Induced Liver Injury, significant GI side effects that include but not limited to Diarrhea, Nausea, Vomiting,  and other system side effects that include Fatigue, headaches, weight loss and other side effects such as skin rash. These will be monitored with  blood work such as LFT initially once a month for 6 months and then quarterly   HPI CHOYA TORNOW 73 y.o. -returns for follow-up.  At this visit was supposed to follow-up on uptake with pirfenidone.  But review of the records indicate that pirfenidone was never started.  I do not know where the gap was.  He is not sure.  I apologize for any gap in and starting pirfenidone.  Overall he is stable.  In fact he says he is slightly better from a shortness of breath perspective.  However his subjective dyspnea score remains the same.  Last visit I gave diagnosis of IPF clinical and consider amiodarone as a potential risk factor for progression.  He has since met with Dr. Gala Romney.  Reviewed cardiology notes and his notes.  It is deemed that amiodarone is probably not playing a significant role in his ILD.  I did agree that amiodarone is probably not the causative factor for his ILD.  In any event he is cut his amiodarone down to half dose at this point.  He says he did that 6-7 weeks ago and since then his dyspnea slightly better particularly while climbing stairs.  He has not relapsed into atrial fibrillation.  There is some talk about going to [facility] electrophysiology for another opinion but I am not sure if he is done this or not.  We discussed pirfenidone again and he is willing to take it.  I did indicate need for liver function test monitoring and application of sunscreen and taking it with food and spacing 5-6 hours apart.  He is willing to do this.  He says he will try it for 1 or 2 years  and see how it goes.   Of note he does have chronic sinus drainage she started taking Sudafed recently and it is controlling it but is wondering about the long-term effects of Sudafed.  I did indicate hypertension is 1 of it.  RAST allergy panel is negative CT scan sinus results are pending.  He is wondering about a special procedure called nasal ablation.  I do not know much about it.  Asked him to talk to his primary care about it.  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 WE discussed [employer]   [place] [facility] Comprehensive ILD Questionnaire  Symptoms:  His current symptom severity is as below. SYMPTOM SCALE - ILD 12/21/2023 02/06/2024 Yet to start pirfenidone  Current weight    O2 use ra   Shortness of Breath 0 -> 5 scale with  5 being worst (score 6 If unable to do)   At rest 0 0  Simple tasks - showers, clothes change, eating, shaving 3 2  Household (dishes, doing bed, laundry) 3 2  Shopping 2 2  Walking Vazquez at own pace 2 2  Walking up Stairs 3 5  Total (30-36) Dyspnea Score 13 13      Non-dyspnea symptoms (0-> 5 scale) 12/21/2023 02/06/2024   How bad is your cough? 0 0  How bad is your fatigue 1 1  How bad is nausea 00 0  How bad is vomiting?  0 00  How bad is diarrhea? 0   How bad is anxiety? 0 00  How bad is depression 00 0  Any chronic pain - if so where and how bad 0 00    Simple office walk 224 (66+46 x 2) feet Pod A at [facility] x  3 laps goal with forehead probe 02/06/2024    O2 used ra   Number laps completed 3 las   Comments about pace fast   Resting Pulse Ox/HR 99% and 68/min   Final Pulse Ox/HR 90% and 97/min   Desaturated </= 88% no   Desaturated <= 3% points yes   Got Tachycardic >/= 90/min yes   Symptoms at end of test Mild dyspna   Miscellaneous comments x    LF Tnormal Feb 2025   PFT     Latest Ref Rng & Units 02/05/2024    3:25 PM 07/01/2014   10:52 AM  PFT Results  FVC-Pre L 3.23  P 4.68   FVC-Predicted  Pre % 70  P 94   FVC-Post L 3.36  P 4.76   FVC-Predicted Post % 73  P 96   Pre FEV1/FVC % % 81  P 80   Post FEV1/FCV % % 78  P 82   FEV1-Pre L 2.62  P 3.74   FEV1-Predicted Pre % 78  P 101   FEV1-Post L 2.64  P 3.91   DLCO uncorrected ml/min/mmHg 11.31  P 17.83   DLCO UNC% % 42  P 50   DLCO corrected ml/min/mmHg 11.13  P   DLCO COR %Predicted % 41  P   DLVA Predicted % 67  P 59   TLC L 4.72  P 6.10   TLC % Predicted % 63  P 82   RV % Predicted % 53  P 52     P Preliminary result      Latest Reference Vazquez & Units 12/21/23 15:59  Class Description Allergens  Comment  D Pteronyssinus IgE Class 0 kU/L <0.10  D Farinae IgE Class 0 kU/L <0.10  Cat Dander IgE Class 0 kU/L <0.10  Dog Dander IgE Class 0 kU/L <0.10  Penicillium Chrysogen IgE Class 0 kU/L <0.10  Cladosporium Herbarum IgE Class 0 kU/L <0.10  Aspergillus Fumigatus IgE Class 0 kU/L <0.10  Mucor Racemosus IgE Class 0 kU/L <0.10  Alternaria Alternata IgE Class 0 kU/L <0.10  Stemphylium Herbarum IgE Class 0 kU/L <0.10  Goose Feathers IgE Class 0 kU/L <0.10  Chicken Feathers IgE Class 0 kU/L <0.10  Duck Feathers IgE Class 0 kU/L <0.10  Aspergillus Fumigatus NEGATIVE  NEGATIVE  Mouse Urine IgE Class 0 kU/L <0.10  Pigeon Serum NEGATIVE  NEGATIVE  Anti Nuclear Antibody (ANA) NEGATIVE  POSITIVE !  ANA Pattern 1  Nuclear, Homogeneous !  ANA Titer 1 titer 1:80 (H)  Cyclic Citrullin Peptide Ab UNITS <16  ds DNA Ab IU/mL <  1  RA Latex Turbid. <14 IU/mL <10  SSA (Ro) (ENA) Antibody, IgG <1.0 NEG AI <1.0 NEG  SSB (La) (ENA) Antibody, IgG <1.0 NEG AI <1.0 NEG  Scleroderma (Scl-70) (ENA) Antibody, IgG <1.0 NEG AI <1.0 NEG  !: Data is abnormal (H): Data is abnormally high  LAB RESULTS last 96 hours No results found.       has a past medical history of Atrial flutter (HCC), CHF (congestive heart failure) (HCC), Coronary artery disease, Degeneration of lumbar intervertebral disc, GERD (gastroesophageal reflux disease), GI  bleed, Hypertension, Ischemic heart disease, Low back pain, PAC (premature atrial contraction), PAF (paroxysmal atrial fibrillation) (HCC), PAF (paroxysmal atrial fibrillation) (HCC), and Peyronie's disease.   reports that he quit smoking about 29 years ago. His smoking use included cigarettes. He started smoking about 49 years ago. He has a 20 pack-year smoking history. He has never used smokeless tobacco.  Past Surgical History:  Procedure Laterality Date   ABLATION  05-06-2014   PVI and CTI ablation by Dr Johney Frame   ATRIAL FIBRILLATION ABLATION N/A 05/06/2014   Procedure: ATRIAL FIBRILLATION ABLATION;  Surgeon: Gardiner Rhyme, MD;  Location: [facility] CATH LAB;  Service: Cardiovascular;  Laterality: N/A;   CAPSULOTOMY Right 06/15/2022   Procedure: CAPSULOTOMY TOES 2-4;  Surgeon: Felecia Shelling, DPM;  Location: [facility] ORS;  Service: Podiatry;  Laterality: Right;   CARDIAC CATHETERIZATION  09/14/2001    Mildly decreased left ventricular systolic function --  Native three vessel coronary artery disease as described. -- Status post coronary artery bypass grafting   CORONARY ARTERY BYPASS GRAFT  2000   LIMA to LAD, SVG to second diagonal, seq SVG to ramus and OM, SVG to PDA   HAMMER TOE SURGERY Right 06/15/2022   Procedure: HAMMER TOE CORRECTION TOES 2-4;  Surgeon: Felecia Shelling, DPM;  Location: [facility] ORS;  Service: Podiatry;  Laterality: Right;   HERNIA REPAIR  05/16/2001   Large left indirect inguinal hernia with right direct hernia.   HERNIA REPAIR     Recurrent left inguinal hernia -- Large left indirect inguinal hernia with right direct hernia.   LEFT HEART CATHETERIZATION WITH CORONARY/GRAFT ANGIOGRAM  11/26/2013   Procedure: LEFT HEART CATHETERIZATION WITH Isabel Caprice;  Surgeon: Peter Vazquez [place], MD;  Location: [facility] CATH LAB;  Service: Cardiovascular;;   TEE WITHOUT CARDIOVERSION N/A 05/06/2014   Procedure: TRANSESOPHAGEAL ECHOCARDIOGRAM (TEE);  Surgeon: Lars Masson, MD;  Location: [facility] ENDOSCOPY;   Service: Cardiovascular;  Laterality: N/A;    No Known Allergies  Immunization History  Administered Date(s) Administered   Fluad Quad(high Dose 65+) 08/23/2019, 08/19/2021   Influenza Split 09/30/2014   Influenza, High Dose Seasonal PF 09/11/2015, 09/16/2016, 09/12/2017, 09/10/2018   Influenza-Unspecified 09/14/2020, 08/15/2023   Moderna Sars-Covid-2 Vaccination 10/08/2020   PFIZER(Purple Top)SARS-COV-2 Vaccination 12/06/2019, 12/27/2019   Pneumococcal Conjugate-13 05/16/2019   Pneumococcal Polysaccharide-23 04/07/2021   Pneumococcal-Unspecified 07/22/2012   Tdap 05/16/2019   Zoster Recombinant(Shingrix) 12/14/2020   Zoster, Live 05/22/2011    Family History  Adopted: Yes     Current Outpatient Medications:    amiodarone (PACERONE) 200 MG tablet, Take 200 mg by mouth daily., Disp: , Rfl:    Ascorbic Acid (VITAMIN C) 1000 MG tablet, Take 1,000 mg by mouth daily., Disp: , Rfl:    Azelastine HCl 0.15 % SOLN, Place 1 spray into the nose daily., Disp: , Rfl:    carvedilol (COREG) 12.5 MG tablet, Take 6.25 mg by mouth in the morning. 12.5 mg in the evening, Disp: ,  Rfl:    cholecalciferol (VITAMIN D3) 25 MCG (1000 UNIT) tablet, Take 1,000 Units by mouth daily., Disp: , Rfl:    dicyclomine (BENTYL) 10 MG capsule, Take 10 mg by mouth 4 (four) times daily as needed., Disp: , Rfl:    empagliflozin (JARDIANCE) 10 MG TABS tablet, Take 1 tablet (10 mg total) by mouth daily before breakfast., Disp: 90 tablet, Rfl: 1   NURTEC 75 MG TBDP, Take 75 mg by mouth as needed (The onset of a migraines)., Disp: , Rfl:    sacubitril-valsartan (ENTRESTO) 24-26 MG, Take 1 tablet by mouth 2 (two) times daily., Disp: 180 tablet, Rfl: 3   tamsulosin (FLOMAX) 0.4 MG CAPS capsule, Take 0.4 mg by mouth daily., Disp: , Rfl:       Objective:   Vitals:   02/06/24 1024  BP: 118/70  Pulse: 68  SpO2: 99%  Weight: 205 lb (93 kg)  Height: 6' (1.829 Vazquez)    Estimated body mass index is 27.8 kg/Vazquez as  calculated from the following:   Height as of this encounter: 6' (1.829 Vazquez).   Weight as of this encounter: 205 lb (93 kg).  @WEIGHTCHANGE @  [employer]   02/06/24 1024  Weight: 205 lb (93 kg)     Physical Exam   General: No distress. Looks well O2 at rest: no Cane present: no Sitting in wheel chair: o Frail: no Obese: no Neuro: Alert and Oriented x 3. GCS 15. Speech normal Psych: Pleasant Resp:  Barrel Chest - no.  Wheeze - no, Crackles - YES, No overt respiratory distress CVS: Normal heart sounds. Murmurs - no Ext: Stigmata of Connective Tissue Disease - no HEENT: Normal upper airway. PEERL +. No post nasal drip        Assessment:       ICD-10-CM   1. IPF (idiopathic pulmonary fibrosis) (HCC)  J84.112 Hepatic function panel    2. Encounter for therapeutic drug monitoring  Z51.81 Hepatic function panel    3. Chronic sinusitis, unspecified location  J32.9 Hepatic function panel         Plan:     Patient Instructions     ICD-10-CM   1. DOE (dyspnea on exertion)  R06.09     2. ILD (interstitial lung disease) (HCC)  J84.9     3. IPF (idiopathic pulmonary fibrosis) (HCC)  J84.112     4. Chronic systolic dysfunction of right ventricle  I51.89     5. History of amiodarone therapy  Z92.29       #Interstitial lung disease  Based on radiologic evidence you had ILD in 2019 [CT scan abdomen with the lung image].  Currently in December 2024 it is definitely progressed and there is evidence of honeycombing.  I am provisionally giving her diagnosis of idiopathic pulmonary fibrosis as the specific type of pulmonary fibrosis.  This is based on the fact that you have honeycombing, you have progressed, age greater than 57, male gender, Caucasian ethnicity.  The amiodarone can be a  a risk factor for possible progression  Disease burden is mild- moderate - tendency to drop oxygen with exertion but still adequate  Plan - Definitely indicated is pulmonary  antifibrotic  -Recommending pirfenidone  Per protocol; shared decision making to start this    #Chronic sinusitis and chronic sinus drainage  - RAST allergy panel negative - CT sinus results pending 01/17/24  Plan - await ct sinus results - continue sudafed but further directives from PCP Keith Grandchild, MD and  ENT   #Follow-up - LIFT in 6 weeks -6-7 weeks video visit to discuss uptake with pirfenidone.-Can be with nurse practitioner or Dr. Sherrin Daisy Return in about 6 weeks (around 03/19/2024) for 30 min visit, VIDEO VISIT, with Dr Marchelle Gearing, with any of the APPS.    SIGNATURE    Dr. Kalman Vazquez, Vazquez.D., F.C.C.P,  Pulmonary and Critical Care Medicine Staff Physician, [facility] Director - Interstitial Lung Disease  Program  Pulmonary Fibrosis [facility] at [facility] [place], [place], [address]  Pager: [phone], If no answer or between  15:00h - 7:00h: call [phone] Telephone: [phone]  11:08 AM 4/8/2025HIGh Complexity  OFFICE   2021 E/Vazquez guidelines, first released in 2021, with minor revisions added in 2023. Must meet the requirements for 2 out of 3 dimensions to qualify.    Number and complexity of problems addressed Amount and/or complexity of data reviewed Risk of complications and/or morbidity  Severe exacerbation of chronic illness  Acute or chronic illnesses that may pose a threat to life or bodily function, e.g., multiple trauma, acute MI, pulmonary embolus, severe respiratory distress, progressive rheumatoid arthritis, psychiatric illness with potential threat to self or others, peritonitis, acute renal failure, abrupt change in neurological status Must meet the requirements for 2 of 3 of the categories)  Category 1: Tests and documents, historian  Any combination of 3 of the following:  Assessment requiring an independent historian  Review of prior external note(s) from each unique  source  Review of results of each unique test  Ordering of each unique test    Category 2: Interpretation of tests Category 3: Discuss management/tests  Discussion of management or test interpretation with external physician/other qualified health care professional/appropriate source (not separately reported) -  PHARMACY TEsam  HIGH risk of morbidity from additional diagnostic testing or treatment Examples only:  Drug therapy requiring intensive monitoring for toxicity  Decision for elective major surgery with identified pateint or procedure risk factors  Decision regarding hospitalization or escalation of Vazquez of care  Decision for DNR or to de-escalate care   Parenteral controlled  substances

## 2024-02-07 NOTE — Telephone Encounter (Signed)
 Devki - thanks. I signed one yesterday  Verlon Au -pls ensure the esbritet paper goes to Rx team

## 2024-02-08 NOTE — Telephone Encounter (Signed)
 I placed in the pharm box up front 02/07/24 in the afternoon.

## 2024-02-13 ENCOUNTER — Other Ambulatory Visit: Payer: Self-pay | Admitting: Pharmacist

## 2024-02-13 ENCOUNTER — Other Ambulatory Visit (HOSPITAL_COMMUNITY): Payer: Self-pay

## 2024-02-13 MED ORDER — PIRFENIDONE 267 MG PO TABS: ORAL_TABLET | ORAL | 0 refills | Status: DC

## 2024-02-13 MED ORDER — PIRFENIDONE 267 MG PO TABS
ORAL_TABLET | ORAL | 0 refills | Status: DC
  Filled 2024-02-20: qty 180, 30d supply, fill #0

## 2024-02-13 MED ORDER — PIRFENIDONE 801 MG PO TABS
801.0000 mg | ORAL_TABLET | Freq: Three times a day (TID) | ORAL | 4 refills | Status: DC
  Filled 2024-02-20: qty 90, 30d supply, fill #0

## 2024-02-13 MED ORDER — PIRFENIDONE 801 MG PO TABS: 801.0000 mg | ORAL_TABLET | Freq: Three times a day (TID) | ORAL | 4 refills | Status: DC

## 2024-02-13 NOTE — Progress Notes (Signed)
 Patient starting pirfenidone per protocol. Will recheck LFTs prior to starting.  Rx sent to [facility] for onboarding.  Patient aware of side effects: nausea, rash, sunburns. Monitor LFTs monthly x 6 months then every 3 months  Geraldene Kleine, PharmD, MPH, BCPS, CPP Clinical Pharmacist (Rheumatology and Pulmonology)

## 2024-02-13 NOTE — Telephone Encounter (Signed)
 Patient returned call. He will plan to have hepatic function panel drawn in the next few days before starting pirfenidone  Start pirfenidone per protocol. He has reviewed side effects extensively with Dr. Bertrum Brodie  Repeat LFTs monthly x 6 months then every 3 motnhs  Geraldene Kleine, PharmD, MPH, BCPS, CPP Clinical Pharmacist (Rheumatology and Pulmonology)

## 2024-02-13 NOTE — Telephone Encounter (Signed)
 Received notification from [facility] regarding a prior authorization for PIRFENIDONE. Authorization has been APPROVED from 02/06/2024 to 10/30/2024. Approval letter sent to scan center.  Per test claim, copay for 30 days supply is $124.99  Patient can fill through [facility]: [phone]   Authorization # [id number]  Enrolled patient into pulmonary fibrosis grant through [employer]: Award Period: 08/17/2023 - 02/12/2025 ID: [id number] BIN: [id number] PCN: [id number] Group: [id number] For pharmacy inquiries, contact PDMI at [phone]. For patient inquiries, contact PAF at [phone].  ATC patient to discuss. Unable to reach. Left VM with callback number Patient needs baseline LFTs drawn before starting treatment  Jilberto Vanderwall, PharmD, MPH, BCPS, CPP Clinical Pharmacist (Rheumatology and Pulmonology)

## 2024-02-14 ENCOUNTER — Other Ambulatory Visit

## 2024-02-14 LAB — HEPATIC FUNCTION PANEL
ALT: 9 U/L (ref 0–53)
AST: 25 U/L (ref 0–37)
Albumin: 4.4 g/dL (ref 3.5–5.2)
Alkaline Phosphatase: 118 U/L — ABNORMAL HIGH (ref 39–117)
Bilirubin, Direct: 0.1 mg/dL (ref 0.0–0.3)
Total Bilirubin: 0.6 mg/dL (ref 0.2–1.2)
Total Protein: 7.3 g/dL (ref 6.0–8.3)

## 2024-02-16 ENCOUNTER — Other Ambulatory Visit (HOSPITAL_COMMUNITY)

## 2024-02-19 ENCOUNTER — Encounter: Payer: Self-pay | Admitting: Internal Medicine

## 2024-02-19 ENCOUNTER — Other Ambulatory Visit (HOSPITAL_COMMUNITY): Payer: Self-pay

## 2024-02-19 NOTE — Progress Notes (Signed)
 I will consider this as [name]

## 2024-02-20 ENCOUNTER — Other Ambulatory Visit: Payer: Self-pay

## 2024-02-20 ENCOUNTER — Other Ambulatory Visit (HOSPITAL_COMMUNITY): Payer: Self-pay

## 2024-02-20 NOTE — Progress Notes (Signed)
 Specialty Pharmacy Initial Fill Coordination Note  Keith Vazquez is a 73 y.o. male contacted today regarding initial fill of specialty medication(s) Pirfenidone    Patient requested Delivery   Delivery date: 02/22/24   Verified address: [address]   [place] [address]   Medication will be filled on 02/21/24.   Patient has grant info on file and is aware of $0 copayment.

## 2024-02-21 ENCOUNTER — Other Ambulatory Visit: Payer: Self-pay

## 2024-03-11 ENCOUNTER — Telehealth: Payer: Self-pay | Admitting: Pharmacist

## 2024-03-11 ENCOUNTER — Other Ambulatory Visit: Payer: Self-pay

## 2024-03-11 NOTE — Telephone Encounter (Signed)
 Received message from spec pharmacy:   Called patient for refill and he said he did not read the label about the dose titration so he has been continuing on 1 tab TID since he started. I counseled on the titration directions to make sure he tolerates it before he goes to maximal dosing and he stated that last Friday he had an issue where he fainted coming out of the shower which he has not had before and is concerned if this came from the Pirfenidone  since it was the only thing he had changed recently. He would like to stay on 1 tablet 3 times a day a little longer still before increasing to see if this happens again as he is nervous now to faint. I told him that I'd let you know that he'll try this for another week or so and if he still does not want to increase yet at that time that he should call the office to discuss. We'll be calling him back in about 2 weeks to check in if he has increased and needs refill yet and can let you know if he says anything further on this  Geraldene Kleine, PharmD, MPH, BCPS, CPP Clinical Pharmacist (Rheumatology and Pulmonology)

## 2024-03-11 NOTE — Progress Notes (Signed)
 Clinical Intervention Note  Clinical Intervention Notes: Called patient for refill and he said he did not read the label about the dose titration so he has been continuing on 1 tab TID since he started. I counseled on the titration directions to make sure he tolerates it before he goes to maximal dosing and he stated that last Friday he had an issue where he fainted coming out of the shower which he has not had before and is concerned if this came from the Pirfenidone  since it was the only thing he had changed recently. He would like to stay on 1 tablet 3 times a day a little longer still before increasing to see if this happens again as he is nervous now to faint. Advised that it is reasonable remain on the same dose for a little longer perhaps about 1 week and then if he is not yet ready to increase he should reach out to [facility]. Keith Vazquez is aware. We'll be calling him back in about 2 weeks to check in if he has increased and needs refill yet and if he runs low sooner he will call us .   Clinical Intervention Outcomes: Improved therapy adherence   Rena Carnes Specialty Pharmacist

## 2024-03-14 ENCOUNTER — Ambulatory Visit (HOSPITAL_COMMUNITY)

## 2024-03-29 ENCOUNTER — Other Ambulatory Visit: Payer: Self-pay

## 2024-04-01 ENCOUNTER — Other Ambulatory Visit: Payer: Self-pay

## 2024-04-09 ENCOUNTER — Encounter: Payer: Self-pay | Admitting: Internal Medicine

## 2024-04-09 ENCOUNTER — Telehealth: Admitting: Internal Medicine

## 2024-04-09 VITALS — Ht 72.0 in | Wt 188.0 lb

## 2024-04-09 DIAGNOSIS — J84112 Idiopathic pulmonary fibrosis: Secondary | ICD-10-CM | POA: Diagnosis not present

## 2024-04-09 NOTE — Patient Instructions (Addendum)
 ICD-10-CM   1. DOE (dyspnea on exertion)  R06.09     2. ILD (interstitial lung disease) (HCC)  J84.9     3. IPF (idiopathic pulmonary fibrosis) (HCC)  J84.112     4. Chronic systolic dysfunction of right ventricle  I51.89     5. History of amiodarone  therapy  Z92.29        #Interstitial lung disease -diagnosis of IPF given in the spring 2024 [based on clinical progression and PFT, progression of CT scan 2019-2025, age greater than 29, male gender, Caucasian ethnicity [with amiodarone  risk factor.   Disease burden is mild- moderate - tendency to drop oxygen with exertion but still adequate  Pirfenidone  caused massive intolerance  Plan --Marked pirfenidone  as allergy - Do spirometry and DLCO in 3 months -and at that time talk about further therapeutic options    #Chronic sinusitis and chronic sinus drainage  - [facility] allergy panel negative - CT sinus results pending 01/17/24  Plan -Sudafed and rest per primary care doctor  #Follow-up - 30-minute visit in 3 months but after PFTs

## 2024-04-09 NOTE — Progress Notes (Signed)
 OV 12/21/2023  Subjective:  Patient ID: Keith Vazquez, male , DOB: 1951/06/27 , age 73 y.o. , MRN: [id number] , ADDRESS: [address] [place] [facility] [address] PCP Arcadio Knuckles, MD Patient Care Team: Arcadio Knuckles, MD as PCP - General (Internal Medicine) Swaziland, Peter M, MD as PCP - Cardiology (Cardiology) Jolly Needle, MD (Inactive) as PCP - Electrophysiology (Cardiology) Barrett, Keane Passe as Physician Assistant (Cardiology) Szabat, Tino Foreman, [facility] (Inactive) as Pharmacist (Pharmacist)  This Provider for this visit: Treatment Team:  Attending Provider: Maire Scot, MD    12/21/2023 -   Chief Complaint  Patient presents with   Follow-up     HPI Keith Vazquez 73 y.o. 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  He says in year 2000 he was diagnosed with chronic systolic dysfunction with ejection fraction 40%.  He is states that he never had dyspnea on exertion with that.  He used to workout regularly.  Then sometime around 20 10-2019 he started getting worsening shortness of breath and then he saw cardiology PA and was placed on carvedilol  that improved his shortness of breath 80% and then he was stable for a few years but then shortness of breath started getting worse.  Cardiology increased his carvedilol  but without much change.  He is since then felt that he has had an extra cause for dyspnea and he says he has been pursuing this.  He has had worsening dyspnea on exertion relieved by rest for at least 10 years.  He definitely feels it is undue dyspnea excess of his chronic systolic dysfunction.  Some 5 years ago he was started on amiodarone  and this is for atrial fibrillation and he is in sinus rhythm.  He has had 1 ablation.  He says cardiology will not do another ablation but he feels it is indicated.  He says 1 time when he tried to come down on his amiodarone  his atrial  fibrillation recurred.  He is not aware that he might have pulmonary fibrosis but he did have a CT scan of the chest recently and shows pulmonary fibrosis/ILD and therefore he is here.      Past Medical History :  -Chronic systolic dysfunction.  As far as I can see his echocardiogram from 20 15 through 20 24 all have low ejection fraction sometimes as low as 20% but mostly around 40%. -Atrial fibrillation on amiodarone  x 5 years - Denies any Raynaud's or connective tissue disease. - Denies any COPD or asthma - Denies any hepatitis denies any HIV - Denies any tuberculosis -He has had COVID vaccine but not the disease.  Never hospitalized.   ROS:  -He has significant chronic sinus drainage this is not evaluated.  Is been going on for years.  Recently primary care put him antihistamine.  With this he is 80% better but he prefers to have a workup -Shortness of breath on exertion - No Raynaud's - No acid reflux - No GI symptoms  FAMILY HISTORY of LUNG DISEASE:  -He says he is adopted.  He says he was born at [facility] and he was abandoned there.  He says his adoptive family then brought him to [facility]  when he is around 73 years of age.  PERSONAL EXPOSURE HISTORY:  -Smoked between 1968 and 1987 around 20 cigarettes/day and then quit. - No marijuana - No cocaine no intravenous  drug use  HOME  EXPOSURE and HOBBY DETAILS :  -Lives in a single-family home for the last 28 years.  The home itself is 73 years of age.  Detail organic antigen exposure history is negative -He does have a bird feather pillow -will have to advise him to remove this next time.  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 Rosa College was normal.  See below.  CT Chest data from date: dec 2-24  - personally visualized and independently interpreted : x - my findings are:  BELOW HRCT 10/09/23 Narrative & Impression  CLINICAL DATA:  Shortness of breath on exertion. Bilateral rales and chronic cough.   EXAM: CT CHEST WITHOUT CONTRAST   TECHNIQUE: Multidetector CT imaging of the chest was performed following the standard protocol without intravenous contrast. High resolution imaging of the lungs, as well as inspiratory and expiratory imaging, was performed.   RADIATION DOSE REDUCTION: This exam was performed according to the departmental dose-optimization program which includes automated exposure control, adjustment of the mA and/or kV according to patient size and/or use of iterative reconstruction technique.   COMPARISON:  None Available.   FINDINGS: Cardiovascular: Atherosclerotic calcification of the aorta. Enlarged pulmonic trunk and heart. No pericardial effusion.   Mediastinum/Nodes: No pathologically enlarged mediastinal or axillary lymph nodes. Hilar regions are difficult to definitively evaluate without IV contrast. Air in the esophagus can be seen with dysmotility.   Lungs/Pleura: Predominantly peripheral and basilar predominant subpleural reticulation, traction bronchiectasis/bronchiolectasis and ground-glass. Scattered single layer honeycombing. No pleural fluid. Airway is unremarkable. Minimal air trapping.   Upper Abdomen: Increased liver density, compatible with amiodarone  therapy. Tiny right renal stones. Small low-attenuation lesions in the kidneys. No specific follow-up necessary. Visualized portions of the liver, gallbladder, adrenal glands, kidneys, spleen, pancreas, stomach and bowel are otherwise grossly unremarkable. No upper abdominal adenopathy.   Musculoskeletal: Degenerative changes in the spine.   IMPRESSION: 1.  Peripheral basilar predominant subpleural reticulation, traction bronchiectasis/bronchiolectasis, ground-glass and honeycombing. Findings are consistent with UIP per consensus guidelines: Diagnosis of Idiopathic Pulmonary Fibrosis: An Official ATS/ERS/JRS/ALAT Clinical Practice Guideline. Am Annie Barton Crit Care Med Vol 198, Iss 5, [phone], Jul 01 2017. 2. Tiny right renal stones. 3.  Aortic atherosclerosis (ICD10-I70.0). 4. Enlarged pulmonic trunk, indicative of pulmonary arterial hypertension.     Electronically Signed   By: Shearon Denis M.D.   On: 10/19/2023 13:51          OV 02/06/2024  Subjective:  Patient ID: Keith Vazquez, male , DOB: 11/30/1950 , age 29 y.o. , MRN: [id number] , ADDRESS: [address] [place] [facility] [address] PCP Arcadio Knuckles, MD Patient Care Team: Arcadio Knuckles, MD as PCP - General (Internal Medicine) [place], Peter M, MD as PCP - Cardiology (Cardiology) Jolly Needle, MD (Inactive) as PCP - Electrophysiology (Cardiology) Barrett, Keane Passe as Physician Assistant (Cardiology) Chancey Combe, [facility] (Inactive) as Pharmacist (Pharmacist) Maire Scot, MD as Consulting Physician (Pulmonary Disease)  This Provider for this visit: Treatment Team:  Attending Provider: Maire Scot, MD    02/06/2024 -   Chief Complaint  Patient presents with   Follow-up    PFT done 02/05/24. Breathing has improved slightly and he denies any new  co's.     Based on radiologic evidence you had ILD in 2019 [CT scan abdomen with the lung image].  Currently in December 2024 it is definitely progressed and there is evidence of honeycombing.  I am provisionally giving her diagnosis of idiopathic pulmonary fibrosis as the specific type of pulmonary fibrosis.  This is based on the fact that you have honeycombing, you have progressed, age greater than 24, male gender, Caucasian ethnicity.  The amiodarone  has been a risk factor.   #Chronic systolic  dysfunction #Long history of amiodarone  intake  #Esbriet /Pirfenidone  requires intensive drug monitoring due to high concerns for Adverse effects of , including  Drug Induced Liver Injury, significant GI side effects that include but not limited to Diarrhea, Nausea, Vomiting,  and other system side effects that include Fatigue, headaches, weight loss and other side effects such as skin rash. These will be monitored with  blood work such as LFT initially once a month for 6 months and then quarterly   HPI Keith Vazquez 73 y.o. -returns for follow-up.  At this visit was supposed to follow-up on uptake with pirfenidone .  But review of the records indicate that pirfenidone  was never started.  I do not know where the gap was.  He is not sure.  I apologize for any gap in and starting pirfenidone .  Overall he is stable.  In fact he says he is slightly better from a shortness of breath perspective.  However his subjective dyspnea score remains the same.  Last visit I gave diagnosis of IPF clinical and consider amiodarone  as a potential risk factor for progression.  He has since met with Dr. Bensimhon.  Reviewed cardiology notes and his notes.  It is deemed that amiodarone  is probably not playing a significant role in his ILD.  I did agree that amiodarone  is probably not the causative factor for his ILD.  In any event he is cut his amiodarone  down to half dose at this point.  He says he did that 6-7 weeks ago and since then his dyspnea slightly better particularly while climbing stairs.  He has not relapsed into atrial fibrillation.  There is some talk about going to [facility] electrophysiology for another opinion but I am not sure if he is done this or not.  We discussed pirfenidone  again and he is willing to take it.  I did indicate need for liver function test monitoring and application of sunscreen and taking it with food and spacing 5-6 hours apart.  He is willing to do this.  He says he will try it for 1 or 2 years  and see how it goes.   Of note he does have chronic sinus drainage she started taking Sudafed recently and it is controlling it but is wondering about the long-term effects of Sudafed.  I did indicate hypertension is 1 of it.  RAST allergy panel is negative CT scan sinus results are pending.  He is wondering about a special procedure called nasal ablation.  I do not know much about it.  Asked him to talk to his primary care about it.  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 WE discussed Datsun   [employer] Integrated Comprehensive ILD Questionnaire  Symptoms:   LF Tnormal Feb 2025   OV 04/09/2024  Subjective:  Patient ID: Keith Vazquez, male , DOB: 08-29-1951 , age 68 y.o. , MRN: [id number] , ADDRESS: [address] [place] [address]  PCP Arcadio Knuckles, MD Patient Care Team: Arcadio Knuckles, MD as PCP - General (Internal Medicine) [place], Peter M, MD as PCP - Cardiology (Cardiology) Jolly Needle, MD (Inactive) as PCP - Electrophysiology (Cardiology) Barrett, Keane Passe as Physician Assistant (Cardiology) Szabat, Tino Foreman, [facility] (Inactive) as Pharmacist (Pharmacist) Maire Scot, MD as Consulting Physician (Pulmonary Disease)  This Provider for this visit: Treatment Team:  Attending Provider: Maire Scot, MD    04/09/2024 -   Chief Complaint  Patient presents with   Follow-up    Follow up IPF, having issues medication , he said that it maae him sick and pass out   Type of visit: Video Virtual Visit Identification of patient Keith Vazquez with [birth year] and MRN [id number] - 2 person identifier Risks: Risks, benefits, limitations of telephone visit explained. Patient understood and verbalized agreement to proceed Anyone else on call: just patint  Patient location: his home office This provider location: [address]; [place]; [place] [address]. [facility]. [phone]   HPI Keith Vazquez 73 y.o. -  Clinical IPF followup -ESbriet  followup" By 2nd day -> feeling fatigued and by 3td day gasping for air. That was on 03/08/24 and then stopped esbritet. Only now beginning to feel baseline last few days. This past weekend and last few days toook a month/ Now nearly mormal. Not more dyspneic. He asked me to explain his PFT ans we went over PFT x 10 year shwing decline     His current symptom severity is as below. SYMPTOM SCALE - ILD 12/21/2023 02/06/2024 Yet to start pirfenidone   Current weight    O2 use ra   Shortness of Breath 0 -> 5 scale with 5 being worst (score 6 If unable to do)   At rest 0 0  Simple tasks - showers, clothes change, eating, shaving 3 2  Household (dishes, doing bed, laundry) 3 2  Shopping 2 2  Walking level at own pace 2 2  Walking up Stairs 3 5  Total (30-36) Dyspnea Score 13 13      Non-dyspnea symptoms (0-> 5 scale) 12/21/2023 02/06/2024   How bad is your cough? 0 0  How bad is your fatigue 1 1  How bad is nausea 00 0  How bad is vomiting?  0 00  How bad is diarrhea? 0   How bad is anxiety? 0 00  How bad is depression 00 0  Any chronic pain - if so where and how bad 0 00    Simple office walk 224 (66+46 x 2) feet Pod A at [facility] x  3 laps goal with forehead probe 02/06/2024    O2 used ra   Number laps completed 3 las   Comments about pace fast   Resting Pulse Ox/HR 99% and 68/min   Final Pulse Ox/HR 90% and 97/min   Desaturated </= 88% no   Desaturated <= 3% points yes   Got Tachycardic >/= 90/min yes   Symptoms at end of test Mild dyspna   Miscellaneous comments x      PFT     Latest Ref Rng & Units 02/05/2024    3:25 PM 07/01/2014   10:52 AM  PFT Results  FVC-Pre L 3.23  4.68   FVC-Predicted Pre % 70  94   FVC-Post L 3.36  4.76   FVC-Predicted Post % 73  96   Pre FEV1/FVC % % 81  80   Post FEV1/FCV % %  78  82   FEV1-Pre L 2.62  3.74   FEV1-Predicted Pre % 78  101   FEV1-Post L 2.64  3.91   DLCO uncorrected  ml/min/mmHg 11.31  17.83   DLCO UNC% % 42  50   DLCO corrected ml/min/mmHg 11.13    DLCO COR %Predicted % 41    DLVA Predicted % 67  59   TLC L 4.72  6.10   TLC % Predicted % 63  82   RV % Predicted % 53  52        LAB RESULTS last 96 hours No results found.       has a past medical history of Atrial flutter (HCC), CHF (congestive heart failure) (HCC), Coronary artery disease, Degeneration of lumbar intervertebral disc, GERD (gastroesophageal reflux disease), GI bleed, Hypertension, Ischemic heart disease, Low back pain, PAC (premature atrial contraction), PAF (paroxysmal atrial fibrillation) (HCC), PAF (paroxysmal atrial fibrillation) (HCC), and Peyronie's disease.   reports that he quit smoking about 29 years ago. His smoking use included cigarettes. He started smoking about 49 years ago. He has a 20 pack-year smoking history. He has never used smokeless tobacco.  Past Surgical History:  Procedure Laterality Date   ABLATION  05-06-2014   PVI and CTI ablation by Dr Nunzio Belch   ATRIAL FIBRILLATION ABLATION N/A 05/06/2014   Procedure: ATRIAL FIBRILLATION ABLATION;  Surgeon: Ellaree Gunther, MD;  Location: [facility] CATH LAB;  Service: Cardiovascular;  Laterality: N/A;   CAPSULOTOMY Right 06/15/2022   Procedure: CAPSULOTOMY TOES 2-4;  Surgeon: Dot Gazella, DPM;  Location: [facility] ORS;  Service: Podiatry;  Laterality: Right;   CARDIAC CATHETERIZATION  09/14/2001    Mildly decreased left ventricular systolic function --  Native three vessel coronary artery disease as described. -- Status post coronary artery bypass grafting   CORONARY ARTERY BYPASS GRAFT  2000   LIMA to LAD, SVG to second diagonal, seq SVG to ramus and OM, SVG to PDA   HAMMER TOE SURGERY Right 06/15/2022   Procedure: HAMMER TOE CORRECTION TOES 2-4;  Surgeon: Dot Gazella, DPM;  Location: [facility] ORS;  Service: Podiatry;  Laterality: Right;   HERNIA REPAIR  05/16/2001   Large left indirect inguinal hernia with right direct hernia.    HERNIA REPAIR     Recurrent left inguinal hernia -- Large left indirect inguinal hernia with right direct hernia.   LEFT HEART CATHETERIZATION WITH CORONARY/GRAFT ANGIOGRAM  11/26/2013   Procedure: LEFT HEART CATHETERIZATION WITH Estella Helling;  Surgeon: Peter M Swaziland, MD;  Location: [facility] CATH LAB;  Service: Cardiovascular;;   TEE WITHOUT CARDIOVERSION N/A 05/06/2014   Procedure: TRANSESOPHAGEAL ECHOCARDIOGRAM (TEE);  Surgeon: Liza Riggers, MD;  Location: [facility] ENDOSCOPY;  Service: Cardiovascular;  Laterality: N/A;    No Known Allergies  Immunization History  Administered Date(s) Administered   Fluad Quad(high Dose 65+) 08/23/2019, 08/19/2021   Influenza Split 09/30/2014   Influenza, High Dose Seasonal PF 09/11/2015, 09/16/2016, 09/12/2017, 09/10/2018   Influenza-Unspecified 09/14/2020, 08/15/2023   Moderna Sars-Covid-2 Vaccination 10/08/2020   PFIZER(Purple Top)SARS-COV-2 Vaccination 12/06/2019, 12/27/2019   Pneumococcal Conjugate-13 05/16/2019   Pneumococcal Polysaccharide-23 04/07/2021   Pneumococcal-Unspecified 07/22/2012   Tdap 05/16/2019   Zoster Recombinant(Shingrix ) 12/14/2020   Zoster, Live 05/22/2011    Family History  Adopted: Yes     Current Outpatient Medications:    amiodarone  (PACERONE ) 200 MG tablet, Take 200 mg by mouth daily., Disp: , Rfl:    Ascorbic Acid (VITAMIN C) 1000 MG tablet, Take 1,000 mg by mouth daily.,  Disp: , Rfl:    Azelastine  HCl 0.15 % SOLN, Place 1 spray into the nose daily., Disp: , Rfl:    carvedilol  (COREG ) 12.5 MG tablet, Take 6.25 mg by mouth in the morning. 12.5 mg in the evening, Disp: , Rfl:    cholecalciferol (VITAMIN D3) 25 MCG (1000 UNIT) tablet, Take 1,000 Units by mouth daily., Disp: , Rfl:    dicyclomine  (BENTYL ) 10 MG capsule, Take 10 mg by mouth 4 (four) times daily as needed., Disp: , Rfl:    empagliflozin  (JARDIANCE ) 10 MG TABS tablet, Take 1 tablet (10 mg total) by mouth daily before breakfast., Disp: 90 tablet, Rfl:  1   sacubitril -valsartan  (ENTRESTO ) 24-26 MG, Take 1 tablet by mouth 2 (two) times daily., Disp: 180 tablet, Rfl: 3   tamsulosin (FLOMAX) 0.4 MG CAPS capsule, Take 0.4 mg by mouth daily., Disp: , Rfl:    NURTEC 75 MG TBDP, Take 75 mg by mouth as needed (The onset of a migraines). (Patient not taking: Reported on 04/09/2024), Disp: , Rfl:    Pirfenidone  267 MG TABS, Take 1 tab three times daily for 7 days, then 2 tabs three times daily for 7 days, then 3 tabs three times daily thereafter. (Patient not taking: Reported on 04/09/2024), Disp: 180 tablet, Rfl: 0   Pirfenidone  801 MG TABS, Take 1 tablet (801 mg total) by mouth 3 (three) times daily with meals. (Patient not taking: Reported on 04/09/2024), Disp: 90 tablet, Rfl: 4      Objective:   Vitals:   04/09/24 1047  Weight: 188 lb (85.3 kg)  Height: 6' (1.829 m)    Estimated body mass index is 25.5 kg/m as calculated from the following:   Height as of this encounter: 6' (1.829 m).   Weight as of this encounter: 188 lb (85.3 kg).  @WEIGHTCHANGE @  [employer]   04/09/24 1047  Weight: 188 lb (85.3 kg)     Physical Exam   General: No distress. Looks well O2 at rest: no Cane present: no Sitting in wheel chair: no Frail: no Obese: no Neuro: Alert and Oriented x 3. GCS 15. Speech normal Psych: Pleasant RespNo overt respiratory distress       Assessment:       ICD-10-CM   1. IPF (idiopathic pulmonary fibrosis) (HCC)  W29.562 Pulmonary function test         Plan:     Patient Instructions     ICD-10-CM   1. DOE (dyspnea on exertion)  R06.09     2. ILD (interstitial lung disease) (HCC)  J84.9     3. IPF (idiopathic pulmonary fibrosis) (HCC)  J84.112     4. Chronic systolic dysfunction of right ventricle  I51.89     5. History of amiodarone  therapy  Z92.29        #Interstitial lung disease -diagnosis of IPF given in the spring 2024 [based on clinical progression and PFT, progression of CT scan 2019-2025, age  greater than 74, male gender, Caucasian ethnicity [with amiodarone  risk factor.   Disease burden is mild- moderate - tendency to drop oxygen with exertion but still adequate  Pirfenidone  caused massive intolerance  Plan --Marked pirfenidone  as allergy - Do spirometry and DLCO in 3 months -and at that time talk about further therapeutic options    #Chronic sinusitis and chronic sinus drainage  - RAST allergy panel negative - CT sinus results pending 01/17/24  Plan -Sudafed and rest per primary care doctor  #Follow-up - 30-minute visit in 3  months but after PFTs   FOLLOWUP Return in about 3 months (around 07/10/2024) for 30 min visit, after Spiro and DLCO, with Dr Bertrum Brodie, Face to Face Visit.    SIGNATURE    Dr. Maire Scot, M.D., F.C.C.P,  Pulmonary and Critical Care Medicine Staff Physician, [facility] Director - Interstitial Lung Disease  Program  Pulmonary Fibrosis [facility] at [facility] [place], [place], [address]  Pager: [phone], If no answer or between  15:00h - 7:00h: call [phone] Telephone: [phone]  11:37 AM 04/09/2024

## 2024-04-10 ENCOUNTER — Other Ambulatory Visit: Payer: Self-pay

## 2024-04-23 ENCOUNTER — Encounter: Payer: Self-pay | Admitting: Family Medicine

## 2024-04-23 ENCOUNTER — Telehealth: Payer: Self-pay

## 2024-04-23 ENCOUNTER — Ambulatory Visit (INDEPENDENT_AMBULATORY_CARE_PROVIDER_SITE_OTHER)

## 2024-04-23 ENCOUNTER — Other Ambulatory Visit: Payer: Self-pay

## 2024-04-23 ENCOUNTER — Ambulatory Visit (INDEPENDENT_AMBULATORY_CARE_PROVIDER_SITE_OTHER): Admitting: Family Medicine

## 2024-04-23 VITALS — BP 110/82 | HR 76 | Ht 72.0 in | Wt 191.0 lb

## 2024-04-23 DIAGNOSIS — M1711 Unilateral primary osteoarthritis, right knee: Secondary | ICD-10-CM

## 2024-04-23 DIAGNOSIS — G8929 Other chronic pain: Secondary | ICD-10-CM

## 2024-04-23 DIAGNOSIS — M25561 Pain in right knee: Secondary | ICD-10-CM | POA: Diagnosis not present

## 2024-04-23 NOTE — Telephone Encounter (Signed)
 Please re-auth Zilretta , R knee

## 2024-04-23 NOTE — Progress Notes (Unsigned)
   I, Leotis Batter, CMA acting as a scribe for Artist Lloyd, MD.  Keith Vazquez is a 73 y.o. male who presents to [facility] today for exacerbation of his R knee pain. Pt was last seen by Dr. Lloyd on 10/17/23 and his R knee was injected w/ Zilretta   Today, pt reports stumbling and twisting the knee about 6 weeks ago. Then, about 10 days ago, tripped, catching himself on the left leg. Has some weakness in the leg, struggling to push himself back up. Sx started to improve this past Friday, but wants to make sure there was no worrisome damage. Interesting in starting PT with Lauren.   Dx imaging: 04/13/21 R knee XR   Pertinent review of systems: No fevers or chills  Relevant historical information: Atrial fibrillation heart failure idiopathic pulmonary fibrosis   Exam:  BP 110/82   Pulse 76   Ht 6' (1.829 m)   Wt 191 lb (86.6 kg)   SpO2 95%   BMI 25.90 kg/m  General: Well Developed, well nourished, and in no acute distress.   MSK: Right knee mild effusion normal-appearing otherwise normal motion.    Lab and Radiology Results  X-ray images right knee obtained today personally and independently interpreted. Metallic staples at medial femoral condyle from prior surgery.  Mild medial DJD and moderate patellofemoral DJD.  No acute fractures are visible. Await formal radiology review   Assessment and Plan: 73 y.o. male with right knee pain due to exacerbation of DJD.  Patient had a Zilretta  injection in the middle part of December.  It worked well until recently.  Plan on authorization of Zilretta  again.  Patient should return shortly for Zilretta  shot once we get it authorized.   PDMP not reviewed this encounter. Orders Placed This Encounter  Procedures   DG Knee AP/LAT W/Sunrise Right    Standing Status:   Future    Number of Occurrences:   1    Expiration Date:   05/23/2024    Reason for Exam (SYMPTOM  OR DIAGNOSIS REQUIRED):   right knee  pain    Preferred imaging location?:   [facility]   Ambulatory referral to Physical Therapy    Referral Priority:   Routine    Referral Type:   Physical Medicine    Referral Reason:   Specialty Services Required    Requested Specialty:   Physical Therapy    Number of Visits Requested:   1   No orders of the defined types were placed in this encounter.    Discussed warning signs or symptoms. Please see discharge instructions. Patient expresses understanding.   The above documentation has been reviewed and is accurate and complete Artist Lloyd, M.D.

## 2024-04-23 NOTE — Telephone Encounter (Signed)
 Ran patient for benefits case ID [id number]

## 2024-04-23 NOTE — Patient Instructions (Signed)
 Thank you for coming in today.   Please get an Xray today before you leave   A referral for physical therapy has been submitted. A representative from the physical therapy office will contact you to coordinate scheduling after confirming your benefits with your insurance provider. If you do not hear from the physical therapy office within the next 1-2 weeks, please let us  know.   We will check benefits for Zilretta .

## 2024-04-23 NOTE — Telephone Encounter (Signed)
 Pt called back and said he would like to repeat the Zilretta  injection. Message sent to work on prior-auth. Pt still wanting to be seen this afternoon for a different issue w/ his R knee.

## 2024-04-23 NOTE — Telephone Encounter (Signed)
 Pt self-scheduled visit. Called and left VM to clarify purpose of today's visit, as he previously had a Zilretta  injection in his R knee, which would need to be authorized through his insurance 1st.

## 2024-04-29 NOTE — Telephone Encounter (Signed)
 Can you schedule patient whenever medication is stocked  Zilretta  authorized for right knee Medicare Covered at 80% patient responsible for other 20% Deductible is $257 and has been met Deductible must be met before coverage applies OOP does not apply to these services Reference # Website 04/23/24 [employer] Covers 20% medicare coinsurance and part B deductible Plan follows medicare guidelines No Pa required Document scanned  Reference # [id number]

## 2024-04-30 ENCOUNTER — Ambulatory Visit: Payer: Self-pay | Admitting: Family Medicine

## 2024-04-30 NOTE — Progress Notes (Signed)
 Right knee x-ray shows arthritis worse underneath the kneecap.

## 2024-05-01 NOTE — Telephone Encounter (Signed)
 Scheduled

## 2024-05-01 NOTE — Telephone Encounter (Signed)
 Can you schedule patient whenever medication is stocked   Zilretta  authorized for right knee Medicare Covered at 80% patient responsible for other 20% Deductible is $257 and has been met Deductible must be met before coverage applies OOP does not apply to these services Reference # Website 04/23/24 [employer] Covers 20% medicare coinsurance and part B deductible Plan follows medicare guidelines No Pa required Document scanned  Reference # [id number]

## 2024-05-02 ENCOUNTER — Telehealth (HOSPITAL_COMMUNITY): Payer: Self-pay | Admitting: Internal Medicine

## 2024-05-07 ENCOUNTER — Other Ambulatory Visit: Payer: Self-pay

## 2024-05-07 ENCOUNTER — Ambulatory Visit (INDEPENDENT_AMBULATORY_CARE_PROVIDER_SITE_OTHER): Admitting: Physical Therapy

## 2024-05-07 ENCOUNTER — Encounter: Payer: Self-pay | Admitting: Physical Therapy

## 2024-05-07 DIAGNOSIS — M6281 Muscle weakness (generalized): Secondary | ICD-10-CM | POA: Diagnosis not present

## 2024-05-07 DIAGNOSIS — M25561 Pain in right knee: Secondary | ICD-10-CM | POA: Diagnosis not present

## 2024-05-07 DIAGNOSIS — G8929 Other chronic pain: Secondary | ICD-10-CM

## 2024-05-07 NOTE — Patient Instructions (Signed)
 Access Code: [email or web address] URL: [email or web address] Date: 05/07/2024 Prepared by: Elaine Daring  Exercises - Supine Bridge with Resistance Band  - 3-4 x weekly - 3 sets - 10 reps - 5 seconds hold - Clamshell with Resistance  - 3-4 x weekly - 3 sets - 10 reps - Sit to Stand Without Arm Support  - 3-4 x weekly - 3 sets - 10 reps - Standing Terminal Knee Extension with Resistance  - 3-4 x weekly - 3 sets - 20 reps

## 2024-05-07 NOTE — Therapy (Signed)
 OUTPATIENT PHYSICAL THERAPY EVALUATION   Patient Name: Keith Vazquez MRN: [id number] DOB:1951/05/31, 73 y.o., male Today's Date: 05/07/2024   END OF SESSION:  PT End of Session - 05/07/24 1449     Visit Number 1    Number of Visits 9    Date for PT Re-Evaluation 07/02/24    Authorization Type MCR    Progress Note Due on Visit 10    PT Start Time 1436    PT Stop Time 1530    PT Time Calculation (min) 54 min    Activity Tolerance Patient tolerated treatment well    Behavior During Therapy [facility] for tasks assessed/performed          Past Medical History:  Diagnosis Date   Atrial flutter (HCC)    CHF (congestive heart failure) (HCC)    LVEF at time of cath 2002 50%; now has been 35-40% since 2008 (patient has seen multiple cardiologists over the years including [facility], [place], [facility], [facility] and Jacques Somerset, MD)   Coronary artery disease    CABG x5 in 2000; s/p cath 2002 > 4/5 grafts patent, LVEF 50%   Degeneration of lumbar intervertebral disc    GERD (gastroesophageal reflux disease)    GI bleed    duodenal ulcer   Hypertension    Borderline   Ischemic heart disease    s/p MI, CABG 2000, s/p cath 2002 with 4/5 grafts patent (LIMA to LAD atretic and occluded mid vessel but adequate flow to distal LAD from collaterals / diagonal   Low back pain    PAC (premature atrial contraction)    PAF (paroxysmal atrial fibrillation) (HCC)    initially diagnosed during stress test 2002   PAF (paroxysmal atrial fibrillation) (HCC)    Peyronie's disease    Past Surgical History:  Procedure Laterality Date   ABLATION  05-06-2014   PVI and CTI ablation by Dr Kelsie   ATRIAL FIBRILLATION ABLATION N/A 05/06/2014   Procedure: ATRIAL FIBRILLATION ABLATION;  Surgeon: Lynwood JONETTA Kelsie, MD;  Location: [facility] CATH LAB;  Service: Cardiovascular;  Laterality: N/A;   CAPSULOTOMY Right 06/15/2022   Procedure: CAPSULOTOMY TOES 2-4;  Surgeon: Janit Thresa HERO, DPM;   Location: [facility] ORS;  Service: Podiatry;  Laterality: Right;   CARDIAC CATHETERIZATION  09/14/2001    Mildly decreased left ventricular systolic function --  Native three vessel coronary artery disease as described. -- Status post coronary artery bypass grafting   CORONARY ARTERY BYPASS GRAFT  2000   LIMA to LAD, SVG to second diagonal, seq SVG to ramus and OM, SVG to PDA   HAMMER TOE SURGERY Right 06/15/2022   Procedure: HAMMER TOE CORRECTION TOES 2-4;  Surgeon: Janit Thresa HERO, DPM;  Location: [facility] ORS;  Service: Podiatry;  Laterality: Right;   HERNIA REPAIR  05/16/2001   Large left indirect inguinal hernia with right direct hernia.   HERNIA REPAIR     Recurrent left inguinal hernia -- Large left indirect inguinal hernia with right direct hernia.   LEFT HEART CATHETERIZATION WITH CORONARY/GRAFT ANGIOGRAM  11/26/2013   Procedure: LEFT HEART CATHETERIZATION WITH EL BILE;  Surgeon: Peter M Swaz[place], MD;  Location: [facility] CATH LAB;  Service: Cardiovascular;;   TEE WITHOUT CARDIOVERSION N/A 05/06/2014   Procedure: TRANSESOPHAGEAL ECHOCARDIOGRAM (TEE);  Surgeon: Leim VEAR Moose, MD;  Location: [facility] ENDOSCOPY;  Service: Cardiovascular;  Laterality: N/A;   Patient Active Problem List   Diagnosis Date Noted   IPF (idiopathic pulmonary fibrosis) (HCC) 10/19/2023  Chronic cough 09/27/2023   Bilateral rales 09/27/2023   Need for hepatitis C screening test 09/26/2023   SOB (shortness of breath) on exertion 09/26/2023   Low TSH level 09/26/2023   Encounter for general adult medical examination with abnormal findings 09/26/2023   Non-allergic vasomotor rhinitis 09/26/2023   Primary osteoarthritis of both knees 04/07/2021   PAF (paroxysmal atrial fibrillation) (HCC)    Spinal stenosis of lumbar region with radiculopathy 04/06/2020   DDD (degenerative disc disease), lumbar 04/06/2020   Irritable bowel syndrome with constipation 05/16/2019   Anal sphincter spasm 03/26/2019   Seasonal and perennial  allergic rhinitis 07/05/2018   Migraine without aura and with status migrainosus, not intractable 11/29/2016   Erectile dysfunction due to arterial insufficiency 11/29/2016   BPH (benign prostatic hyperplasia) 02/24/2015   Bilateral carpal tunnel syndrome 02/24/2015   Chronic systolic heart failure (HCC) 11/28/2013   Coronary artery disease    PUD (peptic ulcer disease) 07/22/2013   Hyperlipidemia with target low density lipoprotein (LDL) cholesterol less than 70 mg/dL 89/87/7988   ISCHEMIC CARDIOMYOPATHY 08/11/2010   Essential hypertension 08/10/2010   GERD 04/23/2008    PCP: Joshua Debby CROME, MD  REFERRING PROVIDER: Joane Artist RAMAN, MD  REFERRING DIAG: Primary osteoarthritis of right knee; Chronic pain of right knee  THERAPY DIAG:  Chronic pain of right knee  Muscle weakness (generalized)  Rationale for Evaluation and Treatment: Rehabilitation  ONSET DATE: Chronic   SUBJECTIVE:  SUBJECTIVE STATEMENT: Patient reports in 1987 he tore his MCL and his knee joint has been loose since then. He states his knee will not bend back as much as it should. The pain comes and goes. He has had injections in the right knee. Most recently he twisted his knee and then tripped catching his left foot and landing with his right foot and bending his right knee more than it should. The knee has been getting better but his main issue is trying to strengthening the left knee, using the machines at the gym is almost impossible.   PERTINENT HISTORY: See PMH above  PAIN:  Are you having pain? Yes:  NPRS scale: 3/10 when he uses the right knee Pain location: Right knee Pain description: Sharp Aggravating factors: Stairs, bending the knee, strengthening exercises like squatting Relieving factors: Rest  PRECAUTIONS: None  RED FLAGS: None   WEIGHT BEARING RESTRICTIONS: No  FALLS:  Has patient fallen in last 6 months? No, but had two episodes where he almost fell within past 2 months  LIVING  ENVIRONMENT: Lives in: House/apartment Stairs: Yes: Internal: 14 steps; can reach both  PLOF: Independent  PATIENT GOALS: Be able to strengthen the legs    OBJECTIVE:  Note: Objective measures were completed at Evaluation unless otherwise noted. DIAGNOSTIC FINDINGS:  X-ray right knee 04/29/2024 IMPRESSION: Tricompartmental osteoarthritis, most prominent in the patellofemoral compartment.  PATIENT SURVEYS:  PSFS: 4.25 Squat: 1 Stand smoothly from sitting on low chair: 3 Going up stairs: 7 Going down stairs: 6  COGNITION: Overall cognitive status: Within functional limits for tasks assessed     SENSATION: WFL  MUSCLE LENGTH: Right quad tightness  POSTURE:   Bilateral knee valgus  PALPATION: Non-tender to palpation  LOWER EXTREMITY ROM:  Active ROM Right eval Left eval  Hip flexion    Hip extension    Hip abduction    Hip adduction    Hip internal rotation    Hip external rotation    Knee flexion 135 150  Knee extension 1 hyper 5 hyper  Ankle dorsiflexion    Ankle plantarflexion    Ankle inversion    Ankle eversion     (Blank rows = not tested)  LOWER EXTREMITY MMT:  MMT Right eval Left eval  Hip flexion 4 4  Hip extension 4- 4-  Hip abduction 4- 4-  Hip adduction    Hip internal rotation    Hip external rotation    Knee flexion 5 5  Knee extension 4+ 5  Ankle dorsiflexion    Ankle plantarflexion    Ankle inversion    Ankle eversion     (Blank rows = not tested)  Crepitus noted with right knee extension strength testing  FUNCTIONAL TESTS:  30 seconds chair stand test: 12 reps  GAIT: Assistive device utilized: None Level of assistance: Complete Independence Comments: Grossly WFL                                                                                                                   TREATMENT  [facility] Adult PT Treatment:                                                DATE: 05/07/2024 Bridge with green at knees 10 x 5 sec Side  clamshell with green x 10 Sit to stand x 10 Standing TKE with black x 20  Spent time discussing patient's diagnosis of pulmonary fibrosis and how that limits his aerobic endurance, discussed option of pulmonary rehab to improve his capacity for exercise and also how strengthening his LE will improve aerobic activity, improving postural and mobility to allow better expansion of lungs.  PATIENT EDUCATION:  Education details: Exam findings, POC, HEP Person educated: Patient Education method: Explanation, Demonstration, Tactile cues, Verbal cues, and Handouts Education comprehension: verbalized understanding, returned demonstration, verbal cues required, tactile cues required, and needs further education  HOME EXERCISE PROGRAM: Access Code: [email or web address]    ASSESSMENT: CLINICAL IMPRESSION: Patient is a 73 y.o. male who was seen today for physical therapy evaluation and treatment for chronic right knee pain. He demonstrates slight limitations in his left knee motion and strength compared to the right side, gross strength deficits of the hips and impaired aerobic capacity due to pulmonary pathology, that is likely contributing to his knee pain and impacting his functional ability.  OBJECTIVE IMPAIRMENTS: decreased activity tolerance, decreased ROM, decreased strength, impaired flexibility, and pain.   ACTIVITY LIMITATIONS: bending, sitting, standing, squatting, stairs, and locomotion level  PARTICIPATION LIMITATIONS: community activity and yard work  PERSONAL FACTORS: Fitness, Past/current experiences, and Time since onset of injury/illness/exacerbation are also affecting patient's functional outcome.   REHAB POTENTIAL: Good  CLINICAL DECISION MAKING: Stable/uncomplicated  EVALUATION COMPLEXITY: Low   GOALS: Goals reviewed with patient? Yes  SHORT TERM GOALS: Target date: 06/04/2024  Patient will be I with initial HEP in order to progress with therapy. Baseline: HEP provided at eval Goal  status: INITIAL  2.  Patient will report right knee pain </= 1/10 in order to reduce functional limitations Baseline: 3/10 Goal status: INITIAL  LONG TERM GOALS: Target date: 07/02/2024  Patient will be I with final HEP to maintain progress from PT. Baseline: HEP provided at eval Goal status: INITIAL  2.  Patient will report PSFS >/= 7 in order to indicate improvement in their functional ability. Baseline: 4.25 Goal status: INITIAL  3.  Patient will demonstrate right knee strength 5/5 MMT and hip strength >/= 4/5 MMT to improve activity tolerance and knee control Baseline: see limitations above Goal status: INITIAL  4.  Patient will perform 30 sec stand test >/= 16 reps to indicate improved LE strength and aerobic capacity Baseline: 12 reps Goal status: INITIAL   PLAN: PT FREQUENCY: 1x/week  PT DURATION: 8 weeks  PLANNED INTERVENTIONS: 97164- PT Re-evaluation, 97750- Physical Performance Testing, 97110-Therapeutic exercises, 97530- Therapeutic activity, 97112- Neuromuscular re-education, 97535- Self Care, [id number]- Manual therapy, Patient/Family education, Balance training, Stair training, Taping, Joint mobilization, Joint manipulation, Cryotherapy, and Moist heat  PLAN FOR NEXT SESSION: Review HEP and progress PRN, focus on progress hip and LE strengthening, balance and stability training    Elaine Daring, PT, DPT, LAT, ATC 05/07/24  3:52 PM Phone: [phone] Fax: [phone]

## 2024-05-09 ENCOUNTER — Encounter: Payer: Self-pay | Admitting: Family Medicine

## 2024-05-09 ENCOUNTER — Ambulatory Visit: Admitting: Family Medicine

## 2024-05-09 ENCOUNTER — Other Ambulatory Visit: Payer: Self-pay

## 2024-05-09 DIAGNOSIS — M25561 Pain in right knee: Secondary | ICD-10-CM | POA: Diagnosis not present

## 2024-05-09 DIAGNOSIS — M1711 Unilateral primary osteoarthritis, right knee: Secondary | ICD-10-CM

## 2024-05-09 DIAGNOSIS — G8929 Other chronic pain: Secondary | ICD-10-CM

## 2024-05-09 MED ORDER — TRIAMCINOLONE ACETONIDE 32 MG IX SRER
32.0000 mg | Freq: Once | INTRA_ARTICULAR | Status: AC
  Administered 2024-05-09: 32 mg via INTRA_ARTICULAR

## 2024-05-09 NOTE — Patient Instructions (Addendum)
 Thank you for coming in today.   You received an injection today. Seek immediate medical attention if the joint becomes red, extremely painful, or is oozing fluid.   Can consider repeat injection in 12 weeks and 1 day, if needed.

## 2024-05-09 NOTE — Progress Notes (Signed)
  Zilretta  injection right knee Procedure: Real-time Ultrasound Guided Injection of right knee joint superior lateral patellar space Device: Philips Affiniti 50G Images permanently stored and available for review in PACS Verbal informed consent obtained.  Discussed risks and benefits of procedure. Warned about infection, hyperglycemia bleeding, damage to structures among others. Patient expresses understanding and agreement Time-out conducted.   Noted no overlying erythema, induration, or other signs of local infection.   Skin prepped in a sterile fashion.   Local anesthesia: Topical Ethyl chloride.   With sterile technique and under real time ultrasound guidance: Zilretta  32 mg injected into knee joint. Fluid seen entering the joint capsule.   Completed without difficulty   Advised to call if fevers/chills, erythema, induration, drainage, or persistent bleeding.   Images permanently stored and available for review in the ultrasound unit.  Impression: Technically successful ultrasound guided injection.  Lot number: [id number]

## 2024-05-13 ENCOUNTER — Other Ambulatory Visit: Payer: Self-pay

## 2024-05-13 ENCOUNTER — Ambulatory Visit (INDEPENDENT_AMBULATORY_CARE_PROVIDER_SITE_OTHER): Admitting: Physical Therapy

## 2024-05-13 ENCOUNTER — Encounter: Payer: Self-pay | Admitting: Physical Therapy

## 2024-05-13 DIAGNOSIS — M25561 Pain in right knee: Secondary | ICD-10-CM

## 2024-05-13 DIAGNOSIS — M6281 Muscle weakness (generalized): Secondary | ICD-10-CM

## 2024-05-13 DIAGNOSIS — G8929 Other chronic pain: Secondary | ICD-10-CM

## 2024-05-13 NOTE — Therapy (Signed)
 OUTPATIENT PHYSICAL THERAPY TREATMENT   Patient Name: Keith Vazquez MRN: [id number] DOB:September 19, 1951, 73 y.o., male Today's Date: 05/13/2024   END OF SESSION:  PT End of Session - 05/13/24 1141     Visit Number 2    Number of Visits 9    Date for PT Re-Evaluation 07/02/24    Authorization Type MCR    Progress Note Due on Visit 10    PT Start Time 1145    PT Stop Time 1230    PT Time Calculation (min) 45 min    Activity Tolerance Patient tolerated treatment well    Behavior During Therapy [facility] for tasks assessed/performed           Past Medical History:  Diagnosis Date   Atrial flutter (HCC)    CHF (congestive heart failure) (HCC)    LVEF at time of cath 2002 50%; now has been 35-40% since 2008 (patient has seen multiple cardiologists over the years including [facility], [place], [facility], [facility] and Jacques Somerset, MD)   Coronary artery disease    CABG x5 in 2000; s/p cath 2002 > 4/5 grafts patent, LVEF 50%   Degeneration of lumbar intervertebral disc    GERD (gastroesophageal reflux disease)    GI bleed    duodenal ulcer   Hypertension    Borderline   Ischemic heart disease    s/p MI, CABG 2000, s/p cath 2002 with 4/5 grafts patent (LIMA to LAD atretic and occluded mid vessel but adequate flow to distal LAD from collaterals / diagonal   Low back pain    PAC (premature atrial contraction)    PAF (paroxysmal atrial fibrillation) (HCC)    initially diagnosed during stress test 2002   PAF (paroxysmal atrial fibrillation) (HCC)    Peyronie's disease    Past Surgical History:  Procedure Laterality Date   ABLATION  05-06-2014   PVI and CTI ablation by Dr Kelsie   ATRIAL FIBRILLATION ABLATION N/A 05/06/2014   Procedure: ATRIAL FIBRILLATION ABLATION;  Surgeon: Lynwood JONETTA Kelsie, MD;  [facility]: [facility] CATH LAB;  Service: Cardiovascular;  Laterality: N/A;   CAPSULOTOMY Right 06/15/2022   Procedure: CAPSULOTOMY TOES 2-4;  Surgeon: Janit Thresa HERO, DPM;   Location: [facility] ORS;  Service: Podiatry;  Laterality: Right;   CARDIAC CATHETERIZATION  09/14/2001    Mildly decreased left ventricular systolic function --  Native three vessel coronary artery disease as described. -- Status post coronary artery bypass grafting   CORONARY ARTERY BYPASS GRAFT  2000   LIMA to LAD, SVG to second diagonal, seq SVG to ramus and OM, SVG to PDA   HAMMER TOE SURGERY Right 06/15/2022   Procedure: HAMMER TOE CORRECTION TOES 2-4;  Surgeon: Janit Thresa HERO, DPM;  Location: [facility] ORS;  Service: Podiatry;  Laterality: Right;   HERNIA REPAIR  05/16/2001   Large left indirect inguinal hernia with right direct hernia.   HERNIA REPAIR     Recurrent left inguinal hernia -- Large left indirect inguinal hernia with right direct hernia.   LEFT HEART CATHETERIZATION WITH CORONARY/GRAFT ANGIOGRAM  11/26/2013   Procedure: LEFT HEART CATHETERIZATION WITH EL BILE;  Surgeon: Peter M Swaziland, MD;  Location: [facility] CATH LAB;  Service: Cardiovascular;;   TEE WITHOUT CARDIOVERSION N/A 05/06/2014   Procedure: TRANSESOPHAGEAL ECHOCARDIOGRAM (TEE);  Surgeon: Leim VEAR Moose, MD;  Location: [facility] ENDOSCOPY;  Service: Cardiovascular;  Laterality: N/A;   Patient Active Problem List   Diagnosis Date Noted   IPF (idiopathic pulmonary fibrosis) (HCC) 10/19/2023  Chronic cough 09/27/2023   Bilateral rales 09/27/2023   Need for hepatitis C screening test 09/26/2023   SOB (shortness of breath) on exertion 09/26/2023   Low TSH level 09/26/2023   Encounter for general adult medical examination with abnormal findings 09/26/2023   Non-allergic vasomotor rhinitis 09/26/2023   Primary osteoarthritis of both knees 04/07/2021   PAF (paroxysmal atrial fibrillation) (HCC)    Spinal stenosis of lumbar region with radiculopathy 04/06/2020   DDD (degenerative disc disease), lumbar 04/06/2020   Irritable bowel syndrome with constipation 05/16/2019   Anal sphincter spasm 03/26/2019   Seasonal and perennial  allergic rhinitis 07/05/2018   Migraine without aura and with status migrainosus, not intractable 11/29/2016   Erectile dysfunction due to arterial insufficiency 11/29/2016   BPH (benign prostatic hyperplasia) 02/24/2015   Bilateral carpal tunnel syndrome 02/24/2015   Chronic systolic heart failure (HCC) 11/28/2013   Coronary artery disease    PUD (peptic ulcer disease) 07/22/2013   Hyperlipidemia with target low density lipoprotein (LDL) cholesterol less than 70 mg/dL 89/87/7988   ISCHEMIC CARDIOMYOPATHY 08/11/2010   Essential hypertension 08/10/2010   GERD 04/23/2008    PCP: Joshua Debby CROME, MD  REFERRING PROVIDER: Joane Artist RAMAN, MD  REFERRING DIAG: Primary osteoarthritis of right knee; Chronic pain of right knee  THERAPY DIAG:  Chronic pain of right knee  Muscle weakness (generalized)  Rationale for Evaluation and Treatment: Rehabilitation  ONSET DATE: Chronic   SUBJECTIVE:  SUBJECTIVE STATEMENT: Patient reports he is feeling better following injection.   Eval: Patient reports in 1987 he tore his MCL and his knee joint has been loose since then. He states his knee will not bend back as much as it should. The pain comes and goes. He has had injections in the right knee. Most recently he twisted his knee and then tripped catching his left foot and landing with his right foot and bending his right knee more than it should. The knee has been getting better but his main issue is trying to strengthening the left knee, using the machines at the gym is almost impossible.   PERTINENT HISTORY: See PMH above  PAIN:  Are you having pain? Yes:  NPRS scale: 0/10 Pain location: Right knee Pain description: Sharp Aggravating factors: Stairs, bending the knee, strengthening exercises like squatting Relieving factors: Rest  PRECAUTIONS: None  PATIENT GOALS: Be able to strengthen the legs    OBJECTIVE:  Note: Objective measures were completed at Evaluation unless otherwise  noted. PATIENT SURVEYS:  PSFS: 4.25 Squat: 1 Stand smoothly from sitting on low chair: 3 Going up stairs: 7 Going down stairs: 6  MUSCLE LENGTH: Right quad tightness  POSTURE:   Bilateral knee valgus  PALPATION: Non-tender to palpation  LOWER EXTREMITY ROM:  Active ROM Right eval Left eval  Hip flexion    Hip extension    Hip abduction    Hip adduction    Hip internal rotation    Hip external rotation    Knee flexion 135 150  Knee extension 1 hyper 5 hyper  Ankle dorsiflexion    Ankle plantarflexion    Ankle inversion    Ankle eversion     (Blank rows = not tested)  LOWER EXTREMITY MMT:  MMT Right eval Left eval  Hip flexion 4 4  Hip extension 4- 4-  Hip abduction 4- 4-  Hip adduction    Hip internal rotation    Hip external rotation    Knee flexion 5 5  Knee extension 4+ 5  Ankle dorsiflexion    Ankle plantarflexion    Ankle inversion    Ankle eversion     (Blank rows = not tested)  Crepitus noted with right knee extension strength testing  FUNCTIONAL TESTS:  30 seconds chair stand test: 12 reps  05/13/2024: SLS: > 30 sec each  GAIT: Assistive device utilized: None Level of assistance: Complete Independence Comments: Grossly WFL                                                                                                                   TREATMENT  [facility] Adult PT Treatment:                                                DATE: 05/13/2024 Recumbent bike L4 x 5 min to improve endurance and workload capacity SLS x 30 sec each, on Airex x 30 sec each Tandem stance on Airex x 30 sec each Bridge with blue at knees 2 x 10 Side clamshell with blue 2 x 15 each Sit to stand x 10, holding 15# at chest with elevated table 2 x 10 LAQ with 5# 3 x 10 each Lateral band walk with blue at knees x 2 lengths down/back Forward 8 step-up 2 x 10 each Longsitting knee flexion gapping with towel behind knee 10 x 5 sec  PATIENT EDUCATION:  Education details:  HEP update Person educated: Patient Education method: Explanation, Demonstration, Tactile cues, Verbal cues, and Handouts Education comprehension: verbalized understanding, returned demonstration, verbal cues required, tactile cues required, and needs further education  HOME EXERCISE PROGRAM: Access Code: [email or web address]    ASSESSMENT: CLINICAL IMPRESSION: Patient tolerated therapy well with no adverse effects. Therapy focused primarily on progressing LE strengthening and improving right knee flexion with good tolerance. He was able to progress with banded resistance for hip strengthening and increased weight for knee strengthening. He did do well with balance training this visit and did have greater difficulty on foam surface. Updated his HEP to progress banded resistance for hip strengthening and incorporating knee flexion stretching for home. Patient would benefit from continued skilled PT to progress mobility and strength in order to reduce pain and maximize functional ability.   Eval: Patient is a 73 y.o. male who was seen today for physical therapy evaluation and treatment for chronic right knee pain. He demonstrates slight limitations in his left knee motion and strength compared to the right side, gross strength deficits of the hips and impaired aerobic capacity due to pulmonary pathology, that is likely contributing to his knee pain and impacting his functional ability.  OBJECTIVE IMPAIRMENTS: decreased activity tolerance, decreased ROM, decreased strength, impaired flexibility, and pain.   ACTIVITY LIMITATIONS: bending, sitting, standing, squatting, stairs, and locomotion level  PARTICIPATION LIMITATIONS: community activity and yard work  PERSONAL FACTORS: Fitness, Past/current experiences, and Time since onset of injury/illness/exacerbation are also affecting patient's functional outcome.  GOALS: Goals reviewed with patient? Yes  SHORT TERM GOALS: Target date: 06/04/2024  Patient  will be I with initial HEP in order to progress with therapy. Baseline: HEP provided at eval Goal status: INITIAL  2.  Patient will report right knee pain </= 1/10 in order to reduce functional limitations Baseline: 3/10 Goal status: INITIAL  LONG TERM GOALS: Target date: 07/02/2024  Patient will be I with final HEP to maintain progress from PT. Baseline: HEP provided at eval Goal status: INITIAL  2.  Patient will report PSFS >/= 7 in order to indicate improvement in their functional ability. Baseline: 4.25 Goal status: INITIAL  3.  Patient will demonstrate right knee strength 5/5 MMT and hip strength >/= 4/5 MMT to improve activity tolerance and knee control Baseline: see limitations above Goal status: INITIAL  4.  Patient will perform 30 sec stand test >/= 16 reps to indicate improved LE strength and aerobic capacity Baseline: 12 reps Goal status: INITIAL   PLAN: PT FREQUENCY: 1x/week  PT DURATION: 8 weeks  PLANNED INTERVENTIONS: 97164- PT Re-evaluation, 97750- Physical Performance Testing, 97110-Therapeutic exercises, 97530- Therapeutic activity, 97112- Neuromuscular re-education, 97535- Self Care, [id number]- Manual therapy, Patient/Family education, Balance training, Stair training, Taping, Joint mobilization, Joint manipulation, Cryotherapy, and Moist heat  PLAN FOR NEXT SESSION: Review HEP and progress PRN, focus on progress hip and LE strengthening, balance and stability training    Elaine Daring, PT, DPT, LAT, ATC 05/13/24  12:42 PM Phone: [phone] Fax: [phone]

## 2024-05-29 ENCOUNTER — Encounter: Admitting: Physical Therapy

## 2024-06-05 ENCOUNTER — Ambulatory Visit (INDEPENDENT_AMBULATORY_CARE_PROVIDER_SITE_OTHER): Admitting: Physical Therapy

## 2024-06-05 ENCOUNTER — Encounter: Payer: Self-pay | Admitting: Physical Therapy

## 2024-06-05 ENCOUNTER — Other Ambulatory Visit: Payer: Self-pay

## 2024-06-05 DIAGNOSIS — M6281 Muscle weakness (generalized): Secondary | ICD-10-CM | POA: Diagnosis not present

## 2024-06-05 DIAGNOSIS — M25561 Pain in right knee: Secondary | ICD-10-CM | POA: Diagnosis not present

## 2024-06-05 DIAGNOSIS — G8929 Other chronic pain: Secondary | ICD-10-CM

## 2024-06-05 NOTE — Therapy (Addendum)
 " OUTPATIENT PHYSICAL THERAPY TREATMENT  DISCHARGE   Patient Name: Keith Vazquez MRN: [id number] DOB:17-Sep-1951, 73 y.o., male Today's Date: 06/05/2024   END OF SESSION:  PT End of Session - 06/05/24 1515     Visit Number 3    Number of Visits 9    Date for PT Re-Evaluation 07/02/24    Authorization Type MCR    Progress Note Due on Visit 10    PT Start Time 1515    PT Stop Time 1555    PT Time Calculation (min) 40 min    Activity Tolerance Patient tolerated treatment well    Behavior During Therapy [facility] for tasks assessed/performed            Past Medical History:  Diagnosis Date   Atrial flutter (HCC)    CHF (congestive heart failure) (HCC)    LVEF at time of cath 2002 50%; now has been 35-40% since 2008 (patient has seen multiple cardiologists over the years including [facility], [place], [facility], [facility] and Jacques Somerset, MD)   Coronary artery disease    CABG x5 in 2000; s/p cath 2002 > 4/5 grafts patent, LVEF 50%   Degeneration of lumbar intervertebral disc    GERD (gastroesophageal reflux disease)    GI bleed    duodenal ulcer   Hypertension    Borderline   Ischemic heart disease    s/p MI, CABG 2000, s/p cath 2002 with 4/5 grafts patent (LIMA to LAD atretic and occluded mid vessel but adequate flow to distal LAD from collaterals / diagonal   Low back pain    PAC (premature atrial contraction)    PAF (paroxysmal atrial fibrillation) (HCC)    initially diagnosed during stress test 2002   PAF (paroxysmal atrial fibrillation) (HCC)    Peyronie's disease    Past Surgical History:  Procedure Laterality Date   ABLATION  05-06-2014   PVI and CTI ablation by Dr Kelsie   ATRIAL FIBRILLATION ABLATION N/A 05/06/2014   Procedure: ATRIAL FIBRILLATION ABLATION;  Surgeon: Lynwood JONETTA Kelsie, MD;  Location: [facility] CATH LAB;  Service: Cardiovascular;  Laterality: N/A;   CAPSULOTOMY Right 06/15/2022   Procedure: CAPSULOTOMY TOES 2-4;  Surgeon: Janit Thresa HERO, DPM;  Location: [facility];  Service: Podiatry;  Laterality: Right;   CARDIAC CATHETERIZATION  09/14/2001    Mildly decreased left ventricular systolic function --  Native three vessel coronary artery disease as described. -- Status post coronary artery bypass grafting   CORONARY ARTERY BYPASS GRAFT  2000   LIMA to LAD, SVG to second diagonal, seq SVG to ramus and OM, SVG to PDA   HAMMER TOE SURGERY Right 06/15/2022   Procedure: HAMMER TOE CORRECTION TOES 2-4;  Surgeon: Janit Thresa HERO, DPM;  Location: [facility];  Service: Podiatry;  Laterality: Right;   HERNIA REPAIR  05/16/2001   Large left indirect inguinal hernia with right direct hernia.   HERNIA REPAIR     Recurrent left inguinal hernia -- Large left indirect inguinal hernia with right direct hernia.   LEFT HEART CATHETERIZATION WITH CORONARY/GRAFT ANGIOGRAM  11/26/2013   Procedure: LEFT HEART CATHETERIZATION WITH EL BILE;  Surgeon: Peter M Jordan, MD;  Location: [facility] CATH LAB;  Service: Cardiovascular;;   TEE WITHOUT CARDIOVERSION N/A 05/06/2014   Procedure: TRANSESOPHAGEAL ECHOCARDIOGRAM (TEE);  Surgeon: Leim VEAR Moose, MD;  Location: [facility] ENDOSCOPY;  Service: Cardiovascular;  Laterality: N/A;   Patient Active Problem List   Diagnosis Date Noted   IPF (idiopathic pulmonary  fibrosis) (HCC) 10/19/2023   Chronic cough 09/27/2023   Bilateral rales 09/27/2023   Need for hepatitis C screening test 09/26/2023   SOB (shortness of breath) on exertion 09/26/2023   Low TSH level 09/26/2023   Encounter for general adult medical examination with abnormal findings 09/26/2023   Non-allergic vasomotor rhinitis 09/26/2023   Primary osteoarthritis of both knees 04/07/2021   PAF (paroxysmal atrial fibrillation) (HCC)    Spinal stenosis of lumbar region with radiculopathy 04/06/2020   DDD (degenerative disc disease), lumbar 04/06/2020   Irritable bowel syndrome with constipation 05/16/2019   Anal sphincter spasm 03/26/2019   Seasonal  and perennial allergic rhinitis 07/05/2018   Migraine without aura and with status migrainosus, not intractable 11/29/2016   Erectile dysfunction due to arterial insufficiency 11/29/2016   BPH (benign prostatic hyperplasia) 02/24/2015   Bilateral carpal tunnel syndrome 02/24/2015   Chronic systolic heart failure (HCC) 11/28/2013   Coronary artery disease    PUD (peptic ulcer disease) 07/22/2013   Hyperlipidemia with target low density lipoprotein (LDL) cholesterol less than 70 mg/dL 89/87/7988   ISCHEMIC CARDIOMYOPATHY 08/11/2010   Essential hypertension 08/10/2010   GERD 04/23/2008    PCP: Joshua Debby CROME, MD  REFERRING PROVIDER: Joane Artist RAMAN, MD  REFERRING DIAG: Primary osteoarthritis of right knee; Chronic pain of right knee  THERAPY DIAG:  Chronic pain of right knee  Muscle weakness (generalized)  Rationale for Evaluation and Treatment: Rehabilitation  ONSET DATE: Chronic   SUBJECTIVE:  SUBJECTIVE STATEMENT: Patient reports he is doing well. States every now and then his knee will get achy. Exercises have been going well.   Eval: Patient reports in 1987 he tore his MCL and his knee joint has been loose since then. He states his knee will not bend back as much as it should. The pain comes and goes. He has had injections in the right knee. Most recently he twisted his knee and then tripped catching his left foot and landing with his right foot and bending his right knee more than it should. The knee has been getting better but his main issue is trying to strengthening the left knee, using the machines at the gym is almost impossible.   PERTINENT HISTORY: See PMH above  PAIN:  Are you having pain? Yes:  NPRS scale: 0/10 Pain location: Right knee Pain description: Sharp Aggravating factors: Stairs, bending the knee, strengthening exercises like squatting Relieving factors: Rest  PRECAUTIONS: None  PATIENT GOALS: Be able to strengthen the legs    OBJECTIVE:  Note:  Objective measures were completed at Evaluation unless otherwise noted. PATIENT SURVEYS:  PSFS: 4.25 Squat: 1 Stand smoothly from sitting on low chair: 3 Going up stairs: 7 Going down stairs: 6  MUSCLE LENGTH: Right quad tightness  POSTURE:   Bilateral knee valgus  PALPATION: Non-tender to palpation  LOWER EXTREMITY ROM:  Active ROM Right eval Left eval Right 06/05/2024  Hip flexion     Hip extension     Hip abduction     Hip adduction     Hip internal rotation     Hip external rotation     Knee flexion 135 150 140   Knee extension 1 hyper 5 hyper   Ankle dorsiflexion     Ankle plantarflexion     Ankle inversion     Ankle eversion      (Blank rows = not tested)  LOWER EXTREMITY MMT:  MMT Right eval Left eval  Hip flexion 4 4  Hip extension 4-  4-  Hip abduction 4- 4-  Hip adduction    Hip internal rotation    Hip external rotation    Knee flexion 5 5  Knee extension 4+ 5  Ankle dorsiflexion    Ankle plantarflexion    Ankle inversion    Ankle eversion     (Blank rows = not tested)  Crepitus noted with right knee extension strength testing  FUNCTIONAL TESTS:  30 seconds chair stand test: 12 reps  05/13/2024: SLS: > 30 sec each  GAIT: Assistive device utilized: None Level of assistance: Complete Independence Comments: Grossly WFL                                                                                                                   TREATMENT  [facility] Adult PT Treatment:                                                DATE: 06/05/2024 Recumbent bike L5 x 5 min to improve endurance and workload capacity Modified thomas stretch with passive knee flexion 3 x 30 sec Hooklying knee joint A/P mobs to improve knee flexion and extension Passive knee flexion with gapping TRX squat 2 x 10 Goblet squat to slight elevated table with 15# 3 x 10 Forward 8 step-up 2 x 10 each LAQ with 6# 3 x 12 each Standing TKE with L2 powerband 2 x 20 Sidelying hip  abduction with red at knees 3 x 10  PATIENT EDUCATION:  Education details: HEP Person educated: Patient Education method: Programmer, Multimedia, Demonstration, Tactile cues, Verbal cues Education comprehension: verbalized understanding, returned demonstration, verbal cues required, tactile cues required, and needs further education  HOME EXERCISE PROGRAM: Access Code: [email or web address]    ASSESSMENT: CLINICAL IMPRESSION: Patient tolerated therapy well with no adverse effects. Therapy focused primarily on progressing LE strengthening and improving right knee flexion with good tolerance. He does exhibit improvement in her right knee flexion this visit and is progressing well with his strengthening exercises. No changes were made to his HEP this visit but he was encouraged to incorporate weights and resistance for LE strengthening at gym. Patient would benefit from continued skilled PT to progress mobility and strength in order to reduce pain and maximize functional ability.   Eval: Patient is a 73 y.o. male who was seen today for physical therapy evaluation and treatment for chronic right knee pain. He demonstrates slight limitations in his left knee motion and strength compared to the right side, gross strength deficits of the hips and impaired aerobic capacity due to pulmonary pathology, that is likely contributing to his knee pain and impacting his functional ability.  OBJECTIVE IMPAIRMENTS: decreased activity tolerance, decreased ROM, decreased strength, impaired flexibility, and pain.   ACTIVITY LIMITATIONS: bending, sitting, standing, squatting, stairs, and locomotion level  PARTICIPATION LIMITATIONS: community activity and yard work  PERSONAL FACTORS: Fitness, Past/current experiences, and Time  since onset of injury/illness/exacerbation are also affecting patient's functional outcome.    GOALS: Goals reviewed with patient? Yes  SHORT TERM GOALS: Target date: 06/04/2024  Patient will be I with  initial HEP in order to progress with therapy. Baseline: HEP provided at eval 06/05/2024: independent Goal status: MET  2.  Patient will report right knee pain </= 1/10 in order to reduce functional limitations Baseline: 3/10 06/05/2024: denies right knee pain Goal status: MET  LONG TERM GOALS: Target date: 07/02/2024  Patient will be I with final HEP to maintain progress from PT. Baseline: HEP provided at eval Goal status: INITIAL  2.  Patient will report PSFS >/= 7 in order to indicate improvement in their functional ability. Baseline: 4.25 Goal status: INITIAL  3.  Patient will demonstrate right knee strength 5/5 MMT and hip strength >/= 4/5 MMT to improve activity tolerance and knee control Baseline: see limitations above Goal status: INITIAL  4.  Patient will perform 30 sec stand test >/= 16 reps to indicate improved LE strength and aerobic capacity Baseline: 12 reps Goal status: INITIAL   PLAN: PT FREQUENCY: 1x/week  PT DURATION: 8 weeks  PLANNED INTERVENTIONS: 97164- PT Re-evaluation, 97750- Physical Performance Testing, 97110-Therapeutic exercises, 97530- Therapeutic activity, 97112- Neuromuscular re-education, 97535- Self Care, 02859- Manual therapy, Patient/Family education, Balance training, Stair training, Taping, Joint mobilization, Joint manipulation, Cryotherapy, and Moist heat  PLAN FOR NEXT SESSION: Review HEP and progress PRN, focus on progress hip and LE strengthening, balance and stability training    Elaine Daring, PT, DPT, LAT, ATC 06/05/24  4:03 PM Phone: [phone] Fax: [phone]   PHYSICAL THERAPY DISCHARGE SUMMARY  Visits from Start of Care: 3  Current functional level related to goals / functional outcomes: See above   Remaining deficits: See above   Education / Equipment: HEP   Patient agrees to discharge. Patient goals were not met. Patient is being discharged due to not returning since the last visit.  Elaine Daring, PT, DPT,  LAT, ATC 11/13/2024  11:20 AM Phone: [phone] Fax: [phone]   "

## 2024-06-11 ENCOUNTER — Encounter: Admitting: Physical Therapy

## 2024-06-12 ENCOUNTER — Telehealth: Payer: Self-pay

## 2024-06-12 ENCOUNTER — Encounter: Admitting: Physical Therapy

## 2024-06-12 NOTE — Telephone Encounter (Signed)
 1st attempt : Called patient regarding preop TELE appointment. NA, Left message to contact our office.

## 2024-06-12 NOTE — Telephone Encounter (Signed)
   Name: Keith Vazquez  DOB: 1951-08-02  MRN: [id number]  Primary Cardiologist: Peter Swaziland, MD  Preoperative team, please contact this patient and set up a phone call appointment for further preoperative risk assessment. Please obtain consent and complete medication review. Thank you for your help.  I confirm that guidance regarding antiplatelet and oral anticoagulation therapy has been completed and, if necessary, noted below. None requested.   I also confirmed the patient resides in the state of [place] . As per [facility] Medical Board telemedicine laws, the patient must reside in the state in which the provider is licensed.  Eward Rutigliano D Reyanne Hussar, NP 06/12/2024, 4:44 PM [facility]

## 2024-06-12 NOTE — Telephone Encounter (Signed)
   Pre-operative Risk Assessment    Patient Name: Keith Vazquez  DOB: [birth year] MRN: [id number]   Date of last office visit: 10/16/23 PETER SWAZILAND, MD Date of next office visit: NONE   Request for Surgical Clearance    Procedure:  SEPTOPLASTY AND BILATERAL TURBINATE REDUCTION  Date of Surgery:  Clearance TBD                                Surgeon:  DR CARLIE Surgeon's Group or Practice Name:  [facility] [place] Phone number:  [phone] Fax number:  [phone]   Type of Clearance Requested:   - Medical    Type of Anesthesia:  Not Indicated   Additional requests/questions:    SignedLucie DELENA Ku   06/12/2024, 4:27 PM

## 2024-06-17 ENCOUNTER — Telehealth: Payer: Self-pay

## 2024-06-17 NOTE — Telephone Encounter (Signed)
 Preop tele appt now scheduled, med rec and consent done.

## 2024-06-17 NOTE — Telephone Encounter (Signed)
  Patient Consent for Virtual Visit        Keith Vazquez has provided verbal consent on 06/17/2024 for a virtual visit (video or telephone).   CONSENT FOR VIRTUAL VISIT FOR:  Keith Vazquez  By participating in this virtual visit I agree to the following:  I hereby voluntarily request, consent and authorize [facility] and its employed or contracted physicians, physician assistants, nurse practitioners or other licensed health care professionals (the Practitioner), to provide me with telemedicine health care services (the "Services) as deemed necessary by the treating Practitioner. I acknowledge and consent to receive the Services by the Practitioner via telemedicine. I understand that the telemedicine visit will involve communicating with the Practitioner through live audiovisual communication technology and the disclosure of certain medical information by electronic transmission. I acknowledge that I have been given the opportunity to request an in-person assessment or other available alternative prior to the telemedicine visit and am voluntarily participating in the telemedicine visit.  I understand that I have the right to withhold or withdraw my consent to the use of telemedicine in the course of my care at any time, without affecting my right to future care or treatment, and that the Practitioner or I may terminate the telemedicine visit at any time. I understand that I have the right to inspect all information obtained and/or recorded in the course of the telemedicine visit and may receive copies of available information for a reasonable fee.  I understand that some of the potential risks of receiving the Services via telemedicine include:  Delay or interruption in medical evaluation due to technological equipment failure or disruption; Information transmitted may not be sufficient (e.g. poor resolution of images) to allow for appropriate medical decision making by the  Practitioner; and/or  In rare instances, security protocols could fail, causing a breach of personal health information.  Furthermore, I acknowledge that it is my responsibility to provide information about my medical history, conditions and care that is complete and accurate to the best of my ability. I acknowledge that Practitioner's advice, recommendations, and/or decision may be based on factors not within their control, such as incomplete or inaccurate data provided by me or distortions of diagnostic images or specimens that may result from electronic transmissions. I understand that the practice of medicine is not an exact science and that Practitioner makes no warranties or guarantees regarding treatment outcomes. I acknowledge that a copy of this consent can be made available to me via my patient portal [facility] MyChart), or I can request a printed copy by calling the office of [facility].    I understand that my insurance will be billed for this visit.   I have read or had this consent read to me. I understand the contents of this consent, which adequately explains the benefits and risks of the Services being provided via telemedicine.  I have been provided ample opportunity to ask questions regarding this consent and the Services and have had my questions answered to my satisfaction. I give my informed consent for the services to be provided through the use of telemedicine in my medical care

## 2024-06-27 ENCOUNTER — Ambulatory Visit: Attending: Internal Medicine

## 2024-06-27 DIAGNOSIS — Z01810 Encounter for preprocedural cardiovascular examination: Secondary | ICD-10-CM | POA: Diagnosis present

## 2024-06-27 NOTE — Progress Notes (Signed)
 Virtual Visit via Telephone Note   Because of Keith Vazquez co-morbid illnesses, he is at least at moderate risk for complications without adequate follow up.  This format is felt to be most appropriate for this patient at this time.  Due to technical limitations with video connection Web designer), today's appointment will be conducted as an audio only telehealth visit, and Keith Vazquez verbally agreed to proceed in this manner.   All issues noted in this document were discussed and addressed.  No physical exam could be performed with this format.  Evaluation Performed:  Preoperative cardiovascular risk assessment _____________   Date:  06/27/2024   Patient ID:  Keith Vazquez, DOB 05-Feb-1951, MRN [id number] Patient Location:  Home Provider location:   Office  Primary Care Provider:  Joshua Debby CROME, MD Primary Cardiologist:  Peter Swaziland, MD  Chief Complaint / Patient Profile   73 y.o. y/o male with a h/o s/p CABG, PAF s/p ablation and systolic HF,  who is pending septoplasty with bilateral turbinate reduction and presents today for telephonic preoperative cardiovascular risk assessment.  History of Present Illness    Keith Vazquez is a 73 y.o. male who presents via audio/video conferencing for a telehealth visit today.  Pt was last seen in cardiology clinic on 02/02/24 by Dr. Cherrie.  At that time Keith Vazquez was doing well but remains short of breath at times with improvement after amiodarone  was decreased.  He was exercising 40 minutes of cardio every other day without difficulty. He had Eplerenone  12.5 mg added to GDMT.  The patient is now pending procedure as outlined above. Since his last visit, he has been doing well with no new cardiac complaints.  He denies chest pain, shortness of breath, lower extremity edema, fatigue, palpitations, melena, hematuria, hemoptysis, diaphoresis, weakness, presyncope, syncope, orthopnea, and PND.     Past Medical History     Past Medical History:  Diagnosis Date   Atrial flutter (HCC)    CHF (congestive heart failure) (HCC)    LVEF at time of cath 2002 50%; now has been 35-40% since 2008 (patient has seen multiple cardiologists over the years including [facility], [facility], [facility], [facility] and Jacques Somerset, MD)   Coronary artery disease    CABG x5 in 2000; s/p cath 2002 > 4/5 grafts patent, LVEF 50%   Degeneration of lumbar intervertebral disc    GERD (gastroesophageal reflux disease)    GI bleed    duodenal ulcer   Hypertension    Borderline   Ischemic heart disease    s/p MI, CABG 2000, s/p cath 2002 with 4/5 grafts patent (LIMA to LAD atretic and occluded mid vessel but adequate flow to distal LAD from collaterals / diagonal   Low back pain    PAC (premature atrial contraction)    PAF (paroxysmal atrial fibrillation) (HCC)    initially diagnosed during stress test 2002   PAF (paroxysmal atrial fibrillation) (HCC)    Peyronie's disease    Past Surgical History:  Procedure Laterality Date   ABLATION  05-06-2014   PVI and CTI ablation by Dr Kelsie   ATRIAL FIBRILLATION ABLATION N/A 05/06/2014   Procedure: ATRIAL FIBRILLATION ABLATION;  Surgeon: Lynwood JONETTA Kelsie, MD;  Location: [facility] CATH LAB;  Service: Cardiovascular;  Laterality: N/A;   CAPSULOTOMY Right 06/15/2022   Procedure: CAPSULOTOMY TOES 2-4;  Surgeon: Janit Thresa HERO, DPM;  Location: [facility] ORS;  Service: Podiatry;  Laterality: Right;   CARDIAC CATHETERIZATION  09/14/2001    Mildly decreased left ventricular systolic function --  Native three vessel coronary artery disease as described. -- Status post coronary artery bypass grafting   CORONARY ARTERY BYPASS GRAFT  2000   LIMA to LAD, SVG to second diagonal, seq SVG to ramus and OM, SVG to PDA   HAMMER TOE SURGERY Right 06/15/2022   Procedure: HAMMER TOE CORRECTION TOES 2-4;  Surgeon: Janit Thresa HERO, DPM;  Location: [facility] ORS;  Service: Podiatry;  Laterality: Right;   HERNIA  REPAIR  05/16/2001   Large left indirect inguinal hernia with right direct hernia.   HERNIA REPAIR     Recurrent left inguinal hernia -- Large left indirect inguinal hernia with right direct hernia.   LEFT HEART CATHETERIZATION WITH CORONARY/GRAFT ANGIOGRAM  11/26/2013   Procedure: LEFT HEART CATHETERIZATION WITH EL BILE;  Surgeon: Peter M Swaziland, MD;  Location: [facility] CATH LAB;  Service: Cardiovascular;;   TEE WITHOUT CARDIOVERSION N/A 05/06/2014   Procedure: TRANSESOPHAGEAL ECHOCARDIOGRAM (TEE);  Surgeon: Leim VEAR Moose, MD;  Location: [facility] ENDOSCOPY;  Service: Cardiovascular;  Laterality: N/A;    Allergies  Allergies  Allergen Reactions   Pirfenidone      Home Medications    Prior to Admission medications   Medication Sig Start Date End Date Taking? Authorizing Provider  amiodarone  (PACERONE ) 200 MG tablet Take 200 mg by mouth daily.    [provider]  Ascorbic Acid (VITAMIN C) 1000 MG tablet Take 1,000 mg by mouth daily.    [provider]  Azelastine  HCl 0.15 % SOLN Place 1 spray into the nose daily.    [provider]  carvedilol  (COREG ) 12.5 MG tablet Take 6.25 mg by mouth in the morning. 12.5 mg in the evening    [provider]  cholecalciferol (VITAMIN D3) 25 MCG (1000 UNIT) tablet Take 1,000 Units by mouth daily.    [provider]  dicyclomine  (BENTYL ) 10 MG capsule Take 10 mg by mouth 4 (four) times daily as needed. 04/11/23   [provider]  empagliflozin  (JARDIANCE ) 10 MG TABS tablet Take 1 tablet (10 mg total) by mouth daily before breakfast. 11/09/23   Bensimhon, Toribio SAUNDERS, MD  NURTEC 75 MG TBDP Take 75 mg by mouth as needed (The onset of a migraines). 09/09/23   [provider]  sacubitril -valsartan  (ENTRESTO ) 24-26 MG Take 1 tablet by mouth 2 (two) times daily. 09/08/23   Swaziland, Peter M, MD  tamsulosin (FLOMAX) 0.4 MG CAPS capsule Take 0.4 mg by mouth daily.    [provider]     Physical Exam    Vital Signs:  Keith Vazquez does not have vital signs available for review today.  Given telephonic nature of communication, physical exam is limited. AAOx3. NAD. Normal affect.  Speech and respirations are unlabored.  Accessory Clinical Findings    None  Assessment & Plan    1.  Preoperative Cardiovascular Risk Assessment: - Patient's RCRI score is 11%  The patient affirms he has been doing well without any new cardiac symptoms. They are able to achieve 7 METS without cardiac limitations. Therefore, based on ACC/AHA guidelines, the patient would be at acceptable risk for the planned procedure without further cardiovascular testing. The patient was advised that if he develops new symptoms prior to surgery to contact our office to arrange for a follow-up visit, and he verbalized understanding.   The patient was advised that if he develops new symptoms prior to surgery to contact our office to arrange for  a follow-up visit, and he verbalized understanding.   A copy of this note will be routed to requesting surgeon.  Time:   Today, I have spent 6 minutes with the patient with telehealth technology discussing medical history, symptoms, and management plan.     Wyn Raddle, Jackee Shove, NP  06/27/2024, 7:05 AM

## 2024-07-07 ENCOUNTER — Ambulatory Visit: Payer: Self-pay | Admitting: Internal Medicine

## 2024-08-12 ENCOUNTER — Other Ambulatory Visit: Payer: Self-pay | Admitting: Otolaryngology

## 2024-08-18 ENCOUNTER — Other Ambulatory Visit: Payer: Self-pay | Admitting: Cardiology

## 2024-08-20 ENCOUNTER — Ambulatory Visit (HOSPITAL_COMMUNITY): Admission: RE | Admit: 2024-08-20 | Source: Home / Self Care | Admitting: Otolaryngology

## 2024-08-20 ENCOUNTER — Encounter (HOSPITAL_COMMUNITY): Admission: RE | Payer: Self-pay | Source: Home / Self Care

## 2024-08-20 SURGERY — SEPTOPLASTY, NOSE
Anesthesia: General | Laterality: Bilateral

## 2024-10-09 ENCOUNTER — Encounter: Payer: Self-pay | Admitting: Family Medicine

## 2024-10-09 ENCOUNTER — Telehealth: Payer: Self-pay

## 2024-10-09 NOTE — Telephone Encounter (Signed)
 Please re-verify insurance benefits and prior auth requirements for Zilretta  for RIGHT knee OA.   Last Zilretta  inj 05/09/24

## 2024-10-09 NOTE — Telephone Encounter (Signed)
 Ran zilretta  benefits for right knee case ID [id number]

## 2024-10-11 NOTE — Telephone Encounter (Signed)
 Scheduled 10/23/2024.

## 2024-10-11 NOTE — Telephone Encounter (Signed)
 Can you schedule patient when medication is in. We should be able to get him in before the end of the year    Zilretta  authorized for right knee Medicare Covered at 80% patient responsible for other 20% Deductible is $257 and has been met Deductible must be met before coverage applies OOP does not apply to these services Reference # Website 10/09/24 [employer] Covers 20% medicare coinsurance and part B deductible Plan follows medicare guidelines No Pa required Document scanned  Reference # [id number]

## 2024-10-11 NOTE — Telephone Encounter (Signed)
 Noted

## 2024-10-23 ENCOUNTER — Ambulatory Visit: Admitting: Family Medicine

## 2024-10-23 ENCOUNTER — Other Ambulatory Visit: Payer: Self-pay

## 2024-10-23 VITALS — BP 128/84 | HR 65 | Ht 72.0 in | Wt 191.0 lb

## 2024-10-23 DIAGNOSIS — M25561 Pain in right knee: Secondary | ICD-10-CM

## 2024-10-23 DIAGNOSIS — M1711 Unilateral primary osteoarthritis, right knee: Secondary | ICD-10-CM

## 2024-10-23 DIAGNOSIS — G8929 Other chronic pain: Secondary | ICD-10-CM

## 2024-10-23 MED ORDER — TRIAMCINOLONE ACETONIDE 32 MG IX SRER
32.0000 mg | Freq: Once | INTRA_ARTICULAR | Status: AC
  Administered 2024-10-23: 32 mg via INTRA_ARTICULAR

## 2024-10-23 NOTE — Patient Instructions (Signed)
 Thank you for coming in today.   You received an injection today. Seek immediate medical attention if the joint becomes red, extremely painful, or is oozing fluid.   Let me know when we need to re-authorize Zilretta  for another injection

## 2024-10-23 NOTE — Progress Notes (Signed)
"       ° °  LILLETTE Ileana Collet, PhD, LAT, ATC acting as a scribe for Keith Lloyd, MD.  Keith Vazquez is a 73 y.o. male who presents to [facility] today for exacerbation of his R knee pain. Pt was last seen by Dr. Lloyd on 05/09/24 and his R knee was injected w/ Zilretta   Today, pt reports R knee pain was exacerbated by PT. Pain returning about a month ago. Worsening mechanical symptoms. Pain was exacerbated by knee flexion excises.  Dx imaging: 04/23/24 R knee XR 04/13/21 R knee XR   Pertinent review of systems: No fevers or chills  Relevant historical information: Heart disease   Exam:  BP 128/84   Pulse 65   Ht 6' (1.829 m)   Wt 191 lb (86.6 kg)   SpO2 99%   BMI 25.90 kg/m  General: Well Developed, well nourished, and in no acute distress.   MSK: Right knee minimal effusion normal motion.    Lab and Radiology Results   Zilretta  injection right knee Procedure: Real-time Ultrasound Guided Injection of right knee joint superior lateral patellar space Device: Philips Affiniti 50G Images permanently stored and available for review in PACS Verbal informed consent obtained.  Discussed risks and benefits of procedure. Warned about infection, hyperglycemia bleeding, damage to structures among others. Patient expresses understanding and agreement Time-out conducted.   Noted no overlying erythema, induration, or other signs of local infection.   Skin prepped in a sterile fashion.   Local anesthesia: Topical Ethyl chloride.   With sterile technique and under real time ultrasound guidance: Zilretta  32 mg injected into knee joint. Fluid seen entering the joint capsule.   Completed without difficulty   Advised to call if fevers/chills, erythema, induration, drainage, or persistent bleeding.   Images permanently stored and available for review in the ultrasound unit.  Impression: Technically successful ultrasound guided injection.  Lot number:  [id number]     Assessment and Plan: 73 y.o. male with right knee pain due to DJD.  Plan for repeat Zilretta  injection.  Continue quad strengthening exercises.  Check back as needed.   PDMP not reviewed this encounter. Orders Placed This Encounter  Procedures   US  LIMITED JOINT SPACE STRUCTURES LOW RIGHT(NO LINKED CHARGES)    Reason for Exam (SYMPTOM  OR DIAGNOSIS REQUIRED):   right knee pain    Preferred imaging location?:   [facility] ordered this encounter  Medications   Triamcinolone  Acetonide (ZILRETTA ) intra-articular injection 32 mg     Discussed warning signs or symptoms. Please see discharge instructions. Patient expresses understanding.   The above documentation has been reviewed and is accurate and complete Keith Vazquez, M.D.   "

## 2024-11-01 ENCOUNTER — Telehealth: Payer: Self-pay | Admitting: Cardiology

## 2024-11-01 DIAGNOSIS — I480 Paroxysmal atrial fibrillation: Secondary | ICD-10-CM

## 2024-11-01 NOTE — Telephone Encounter (Signed)
 Called patient to book a follow up with Jordan from a recall and patient states he wants to have a phone conversation with Dr. Jordan - stated a medication change almost killed him and is seeking care at [facility] - would like an EP Referral at [facility].  Please reach out to patient.  Thank you.

## 2024-11-04 NOTE — Telephone Encounter (Signed)
 EP referral placed for [facility]

## 2024-11-15 NOTE — Telephone Encounter (Signed)
 [facility] sent the referral back to us  stating to resend the referral including patient's demographics and most recent OV notes.

## 2024-11-20 NOTE — Telephone Encounter (Signed)
 Patient's demographics,Dr.Jordan's last office note faxed to [facility] Dr.David Fitzgerald at fax # [phone].
